# Patient Record
Sex: Female | Born: 1970 | Race: Black or African American | Hispanic: No | Marital: Married | Smoking: Never smoker
Health system: Southern US, Community
[De-identification: ages and names within clinical notes are randomized; demographics above are authoritative.]

## PROBLEM LIST (undated history)

## (undated) DIAGNOSIS — R7303 Prediabetes: Secondary | ICD-10-CM

## (undated) DIAGNOSIS — I071 Rheumatic tricuspid insufficiency: Secondary | ICD-10-CM

## (undated) DIAGNOSIS — M329 Systemic lupus erythematosus, unspecified: Secondary | ICD-10-CM

## (undated) DIAGNOSIS — I10 Essential (primary) hypertension: Secondary | ICD-10-CM

## (undated) DIAGNOSIS — M8700 Idiopathic aseptic necrosis of unspecified bone: Secondary | ICD-10-CM

## (undated) DIAGNOSIS — I272 Pulmonary hypertension, unspecified: Secondary | ICD-10-CM

## (undated) DIAGNOSIS — M3214 Glomerular disease in systemic lupus erythematosus: Secondary | ICD-10-CM

## (undated) DIAGNOSIS — M311 Thrombotic microangiopathy: Secondary | ICD-10-CM

## (undated) DIAGNOSIS — IMO0002 Reserved for concepts with insufficient information to code with codable children: Secondary | ICD-10-CM

## (undated) HISTORY — DX: Thrombotic microangiopathy: M31.1

## (undated) HISTORY — DX: Prediabetes: R73.03

## (undated) HISTORY — DX: Idiopathic aseptic necrosis of unspecified bone: M87.00

## (undated) HISTORY — DX: Pulmonary hypertension, unspecified: I27.20

## (undated) HISTORY — PX: OTHER SURGICAL HISTORY: SHX169

## (undated) HISTORY — PX: UMBILICAL HERNIA REPAIR: SHX196

## (undated) HISTORY — PX: TOTAL HIP ARTHROPLASTY: SHX124

## (undated) HISTORY — DX: Rheumatic tricuspid insufficiency: I07.1

## (undated) HISTORY — PX: HIP SURGERY: SHX245

## (undated) HISTORY — DX: Glomerular disease in systemic lupus erythematosus: M32.14

---

## 1998-08-04 ENCOUNTER — Ambulatory Visit (HOSPITAL_COMMUNITY): Admission: RE | Admit: 1998-08-04 | Discharge: 1998-08-04 | Payer: Self-pay | Admitting: Emergency Medicine

## 2000-05-31 ENCOUNTER — Other Ambulatory Visit: Admission: RE | Admit: 2000-05-31 | Discharge: 2000-05-31 | Payer: Self-pay | Admitting: *Deleted

## 2001-04-01 ENCOUNTER — Ambulatory Visit (HOSPITAL_COMMUNITY): Admission: RE | Admit: 2001-04-01 | Discharge: 2001-04-01 | Payer: Self-pay | Admitting: Nephrology

## 2001-04-02 ENCOUNTER — Encounter: Admission: RE | Admit: 2001-04-02 | Discharge: 2001-04-02 | Payer: Self-pay | Admitting: Nephrology

## 2001-04-02 ENCOUNTER — Encounter: Payer: Self-pay | Admitting: Nephrology

## 2001-04-03 ENCOUNTER — Encounter (INDEPENDENT_AMBULATORY_CARE_PROVIDER_SITE_OTHER): Payer: Self-pay | Admitting: *Deleted

## 2001-04-03 ENCOUNTER — Observation Stay (HOSPITAL_COMMUNITY): Admission: AD | Admit: 2001-04-03 | Discharge: 2001-04-04 | Payer: Self-pay | Admitting: Nephrology

## 2001-04-03 ENCOUNTER — Encounter: Payer: Self-pay | Admitting: Nephrology

## 2001-07-01 ENCOUNTER — Other Ambulatory Visit: Admission: RE | Admit: 2001-07-01 | Discharge: 2001-07-01 | Payer: Self-pay | Admitting: Obstetrics and Gynecology

## 2002-02-10 ENCOUNTER — Encounter: Payer: Self-pay | Admitting: Rheumatology

## 2002-02-10 ENCOUNTER — Encounter: Admission: RE | Admit: 2002-02-10 | Discharge: 2002-02-10 | Payer: Self-pay | Admitting: Rheumatology

## 2002-02-13 ENCOUNTER — Encounter: Payer: Self-pay | Admitting: Rheumatology

## 2002-02-13 ENCOUNTER — Encounter: Admission: RE | Admit: 2002-02-13 | Discharge: 2002-02-13 | Payer: Self-pay | Admitting: Rheumatology

## 2002-09-14 ENCOUNTER — Other Ambulatory Visit: Admission: RE | Admit: 2002-09-14 | Discharge: 2002-09-14 | Payer: Self-pay | Admitting: Obstetrics and Gynecology

## 2003-09-01 ENCOUNTER — Encounter: Admission: RE | Admit: 2003-09-01 | Discharge: 2003-09-01 | Payer: Self-pay | Admitting: Emergency Medicine

## 2003-10-21 ENCOUNTER — Other Ambulatory Visit: Admission: RE | Admit: 2003-10-21 | Discharge: 2003-10-21 | Payer: Self-pay | Admitting: Obstetrics and Gynecology

## 2005-07-16 DIAGNOSIS — M3119 Other thrombotic microangiopathy: Secondary | ICD-10-CM

## 2005-07-16 HISTORY — DX: Other thrombotic microangiopathy: M31.19

## 2005-12-12 ENCOUNTER — Emergency Department (HOSPITAL_COMMUNITY): Admission: EM | Admit: 2005-12-12 | Discharge: 2005-12-12 | Payer: Self-pay | Admitting: Emergency Medicine

## 2005-12-13 ENCOUNTER — Inpatient Hospital Stay (HOSPITAL_COMMUNITY): Admission: EM | Admit: 2005-12-13 | Discharge: 2005-12-18 | Payer: Self-pay | Admitting: Emergency Medicine

## 2005-12-13 ENCOUNTER — Ambulatory Visit: Payer: Self-pay | Admitting: Hematology and Oncology

## 2005-12-19 ENCOUNTER — Ambulatory Visit: Payer: Self-pay | Admitting: Hematology and Oncology

## 2005-12-19 ENCOUNTER — Encounter (HOSPITAL_COMMUNITY): Admission: RE | Admit: 2005-12-19 | Discharge: 2006-03-19 | Payer: Self-pay | Admitting: Hematology and Oncology

## 2005-12-20 LAB — COMPREHENSIVE METABOLIC PANEL
ALT: 20 U/L (ref 0–40)
AST: 17 U/L (ref 0–37)
Albumin: 3.7 g/dL (ref 3.5–5.2)
Alkaline Phosphatase: 53 U/L (ref 39–117)
Calcium: 9.4 mg/dL (ref 8.4–10.5)
Chloride: 101 mEq/L (ref 96–112)
Potassium: 3.6 mEq/L (ref 3.5–5.3)

## 2005-12-20 LAB — CBC WITH DIFFERENTIAL/PLATELET
BASO%: 0 % (ref 0.0–2.0)
EOS%: 0.1 % (ref 0.0–7.0)
MCH: 30.8 pg (ref 26.0–34.0)
MCHC: 33.5 g/dL (ref 32.0–36.0)
MONO%: 5.4 % (ref 0.0–13.0)
RBC: 2.55 10*6/uL — ABNORMAL LOW (ref 3.70–5.32)
RDW: 18.5 % — ABNORMAL HIGH (ref 11.3–14.5)
lymph#: 0.9 10*3/uL (ref 0.9–3.3)

## 2005-12-28 LAB — CBC WITH DIFFERENTIAL/PLATELET
Basophils Absolute: 0 10*3/uL (ref 0.0–0.1)
EOS%: 0 % (ref 0.0–7.0)
HGB: 9.4 g/dL — ABNORMAL LOW (ref 11.6–15.9)
LYMPH%: 20.9 % (ref 14.0–48.0)
MCH: 29.5 pg (ref 26.0–34.0)
MCV: 89.3 fL (ref 81.0–101.0)
MONO%: 3.9 % (ref 0.0–13.0)
Platelets: 130 10*3/uL — ABNORMAL LOW (ref 145–400)
RBC: 3.18 10*6/uL — ABNORMAL LOW (ref 3.70–5.32)
RDW: 20.3 % — ABNORMAL HIGH (ref 11.3–14.5)

## 2005-12-28 LAB — IRON AND TIBC
%SAT: 58 % — ABNORMAL HIGH (ref 20–55)
Iron: 190 ug/dL — ABNORMAL HIGH (ref 42–145)

## 2006-01-23 ENCOUNTER — Ambulatory Visit (HOSPITAL_COMMUNITY): Admission: RE | Admit: 2006-01-23 | Discharge: 2006-01-23 | Payer: Self-pay | Admitting: Hematology and Oncology

## 2006-01-23 LAB — COMPREHENSIVE METABOLIC PANEL
Albumin: 4.3 g/dL (ref 3.5–5.2)
BUN: 15 mg/dL (ref 6–23)
Calcium: 9.1 mg/dL (ref 8.4–10.5)
Chloride: 107 mEq/L (ref 96–112)
Glucose, Bld: 79 mg/dL (ref 70–99)
Potassium: 4 mEq/L (ref 3.5–5.3)

## 2006-01-23 LAB — CBC WITH DIFFERENTIAL/PLATELET
Basophils Absolute: 0 10*3/uL (ref 0.0–0.1)
HCT: 28.6 % — ABNORMAL LOW (ref 34.8–46.6)
HGB: 9.8 g/dL — ABNORMAL LOW (ref 11.6–15.9)
LYMPH%: 10 % — ABNORMAL LOW (ref 14.0–48.0)
MCH: 31.1 pg (ref 26.0–34.0)
MONO#: 0.4 10*3/uL (ref 0.1–0.9)
NEUT%: 85.3 % — ABNORMAL HIGH (ref 39.6–76.8)
Platelets: 224 10*3/uL (ref 145–400)
WBC: 8.8 10*3/uL (ref 3.9–10.0)
lymph#: 0.9 10*3/uL (ref 0.9–3.3)

## 2006-01-30 ENCOUNTER — Ambulatory Visit: Payer: Self-pay | Admitting: Hematology and Oncology

## 2006-01-30 LAB — COMPREHENSIVE METABOLIC PANEL
AST: 16 U/L (ref 0–37)
Alkaline Phosphatase: 32 U/L — ABNORMAL LOW (ref 39–117)
Glucose, Bld: 75 mg/dL (ref 70–99)
Potassium: 3.9 mEq/L (ref 3.5–5.3)
Sodium: 140 mEq/L (ref 135–145)
Total Bilirubin: 0.4 mg/dL (ref 0.3–1.2)
Total Protein: 7 g/dL (ref 6.0–8.3)

## 2006-01-30 LAB — CBC WITH DIFFERENTIAL/PLATELET
Basophils Absolute: 0 10*3/uL (ref 0.0–0.1)
EOS%: 0.1 % (ref 0.0–7.0)
Eosinophils Absolute: 0 10*3/uL (ref 0.0–0.5)
HGB: 10.4 g/dL — ABNORMAL LOW (ref 11.6–15.9)
MCH: 31.3 pg (ref 26.0–34.0)
MONO#: 0.5 10*3/uL (ref 0.1–0.9)
NEUT#: 6.6 10*3/uL — ABNORMAL HIGH (ref 1.5–6.5)
RDW: 15.9 % — ABNORMAL HIGH (ref 11.3–14.5)
WBC: 7.9 10*3/uL (ref 3.9–10.0)
lymph#: 0.8 10*3/uL — ABNORMAL LOW (ref 0.9–3.3)

## 2006-02-04 LAB — CBC WITH DIFFERENTIAL/PLATELET
Eosinophils Absolute: 0 10*3/uL (ref 0.0–0.5)
HCT: 30.6 % — ABNORMAL LOW (ref 34.8–46.6)
LYMPH%: 16.1 % (ref 14.0–48.0)
MCHC: 34.1 g/dL (ref 32.0–36.0)
MCV: 92.1 fL (ref 81.0–101.0)
MONO#: 0.5 10*3/uL (ref 0.1–0.9)
MONO%: 7.5 % (ref 0.0–13.0)
NEUT#: 5.6 10*3/uL (ref 1.5–6.5)
NEUT%: 76.3 % (ref 39.6–76.8)
Platelets: 275 10*3/uL (ref 145–400)
WBC: 7.3 10*3/uL (ref 3.9–10.0)

## 2006-02-04 LAB — COMPREHENSIVE METABOLIC PANEL
CO2: 25 mEq/L (ref 19–32)
Creatinine, Ser: 0.93 mg/dL (ref 0.40–1.20)
Glucose, Bld: 75 mg/dL (ref 70–99)
Total Bilirubin: 0.4 mg/dL (ref 0.3–1.2)

## 2006-02-04 LAB — LACTATE DEHYDROGENASE: LDH: 287 U/L — ABNORMAL HIGH (ref 94–250)

## 2006-02-15 ENCOUNTER — Other Ambulatory Visit: Admission: RE | Admit: 2006-02-15 | Discharge: 2006-02-15 | Payer: Self-pay | Admitting: Obstetrics and Gynecology

## 2006-02-22 LAB — CBC WITH DIFFERENTIAL/PLATELET
BASO%: 0.2 % (ref 0.0–2.0)
Eosinophils Absolute: 0 10*3/uL (ref 0.0–0.5)
LYMPH%: 17.5 % (ref 14.0–48.0)
MCHC: 34.3 g/dL (ref 32.0–36.0)
MONO#: 0.4 10*3/uL (ref 0.1–0.9)
NEUT#: 4.5 10*3/uL (ref 1.5–6.5)
RBC: 3.24 10*6/uL — ABNORMAL LOW (ref 3.70–5.32)
RDW: 11.7 % (ref 11.3–14.5)
WBC: 6 10*3/uL (ref 3.9–10.0)
lymph#: 1 10*3/uL (ref 0.9–3.3)

## 2006-02-22 LAB — LACTATE DEHYDROGENASE: LDH: 217 U/L (ref 94–250)

## 2006-02-22 LAB — COMPREHENSIVE METABOLIC PANEL
ALT: 16 U/L (ref 0–40)
Albumin: 4.1 g/dL (ref 3.5–5.2)
CO2: 24 mEq/L (ref 19–32)
Chloride: 106 mEq/L (ref 96–112)
Potassium: 4.2 mEq/L (ref 3.5–5.3)
Sodium: 139 mEq/L (ref 135–145)
Total Bilirubin: 0.3 mg/dL (ref 0.3–1.2)
Total Protein: 6.4 g/dL (ref 6.0–8.3)

## 2006-03-04 LAB — CBC WITH DIFFERENTIAL/PLATELET
Basophils Absolute: 0 10*3/uL (ref 0.0–0.1)
HCT: 30.2 % — ABNORMAL LOW (ref 34.8–46.6)
HGB: 10.3 g/dL — ABNORMAL LOW (ref 11.6–15.9)
MCH: 31.4 pg (ref 26.0–34.0)
MONO#: 0.5 10*3/uL (ref 0.1–0.9)
NEUT%: 55.1 % (ref 39.6–76.8)
lymph#: 1.5 10*3/uL (ref 0.9–3.3)

## 2006-03-04 LAB — COMPREHENSIVE METABOLIC PANEL
BUN: 12 mg/dL (ref 6–23)
CO2: 24 mEq/L (ref 19–32)
Calcium: 8.8 mg/dL (ref 8.4–10.5)
Chloride: 110 mEq/L (ref 96–112)
Creatinine, Ser: 0.95 mg/dL (ref 0.40–1.20)
Glucose, Bld: 75 mg/dL (ref 70–99)

## 2006-03-04 LAB — LACTATE DEHYDROGENASE: LDH: 192 U/L (ref 94–250)

## 2006-03-16 ENCOUNTER — Emergency Department (HOSPITAL_COMMUNITY): Admission: EM | Admit: 2006-03-16 | Discharge: 2006-03-16 | Payer: Self-pay | Admitting: Emergency Medicine

## 2006-04-03 ENCOUNTER — Ambulatory Visit: Payer: Self-pay | Admitting: Hematology and Oncology

## 2006-04-05 LAB — CBC WITH DIFFERENTIAL/PLATELET
Basophils Absolute: 0 10*3/uL (ref 0.0–0.1)
EOS%: 0.2 % (ref 0.0–7.0)
Eosinophils Absolute: 0 10*3/uL (ref 0.0–0.5)
HGB: 10.9 g/dL — ABNORMAL LOW (ref 11.6–15.9)
NEUT#: 2.7 10*3/uL (ref 1.5–6.5)
RDW: 10.8 % — ABNORMAL LOW (ref 11.3–14.5)
lymph#: 1.1 10*3/uL (ref 0.9–3.3)

## 2006-04-05 LAB — COMPREHENSIVE METABOLIC PANEL
ALT: 14 U/L (ref 0–40)
Alkaline Phosphatase: 39 U/L (ref 39–117)
CO2: 26 mEq/L (ref 19–32)
Creatinine, Ser: 0.91 mg/dL (ref 0.40–1.20)
Total Bilirubin: 0.3 mg/dL (ref 0.3–1.2)

## 2006-04-05 LAB — LACTATE DEHYDROGENASE: LDH: 149 U/L (ref 94–250)

## 2006-04-25 LAB — COMPREHENSIVE METABOLIC PANEL
Albumin: 4.3 g/dL (ref 3.5–5.2)
BUN: 13 mg/dL (ref 6–23)
CO2: 23 mEq/L (ref 19–32)
Calcium: 8.9 mg/dL (ref 8.4–10.5)
Glucose, Bld: 77 mg/dL (ref 70–99)
Potassium: 3.7 mEq/L (ref 3.5–5.3)
Sodium: 141 mEq/L (ref 135–145)
Total Protein: 7.6 g/dL (ref 6.0–8.3)

## 2006-04-25 LAB — CBC WITH DIFFERENTIAL/PLATELET
Basophils Absolute: 0 10*3/uL (ref 0.0–0.1)
Eosinophils Absolute: 0 10*3/uL (ref 0.0–0.5)
HGB: 10.4 g/dL — ABNORMAL LOW (ref 11.6–15.9)
MCV: 87.4 fL (ref 81.0–101.0)
MONO#: 0.3 10*3/uL (ref 0.1–0.9)
NEUT#: 2.5 10*3/uL (ref 1.5–6.5)
Platelets: 177 10*3/uL (ref 145–400)
RBC: 3.49 10*6/uL — ABNORMAL LOW (ref 3.70–5.32)
RDW: 10.9 % — ABNORMAL LOW (ref 11.3–14.5)
WBC: 3.9 10*3/uL (ref 3.9–10.0)

## 2006-04-25 LAB — LACTATE DEHYDROGENASE: LDH: 155 U/L (ref 94–250)

## 2006-05-29 ENCOUNTER — Ambulatory Visit: Payer: Self-pay | Admitting: Hematology and Oncology

## 2006-06-04 LAB — MORPHOLOGY

## 2006-06-04 LAB — COMPREHENSIVE METABOLIC PANEL
ALT: 11 U/L (ref 0–35)
AST: 15 U/L (ref 0–37)
BUN: 12 mg/dL (ref 6–23)
CO2: 26 mEq/L (ref 19–32)
Calcium: 9.2 mg/dL (ref 8.4–10.5)
Creatinine, Ser: 0.86 mg/dL (ref 0.40–1.20)
Glucose, Bld: 107 mg/dL — ABNORMAL HIGH (ref 70–99)
Sodium: 139 mEq/L (ref 135–145)
Total Bilirubin: 0.5 mg/dL (ref 0.3–1.2)

## 2006-06-04 LAB — CBC WITH DIFFERENTIAL/PLATELET
BASO%: 0.5 % (ref 0.0–2.0)
LYMPH%: 31.5 % (ref 14.0–48.0)
MCV: 86.2 fL (ref 81.0–101.0)
MONO%: 8.2 % (ref 0.0–13.0)
NEUT#: 2.5 10*3/uL (ref 1.5–6.5)
RBC: 3.41 10*6/uL — ABNORMAL LOW (ref 3.70–5.32)
RDW: 12.4 % (ref 11.3–14.5)
lymph#: 1.3 10*3/uL (ref 0.9–3.3)

## 2006-07-02 LAB — CBC WITH DIFFERENTIAL/PLATELET
Eosinophils Absolute: 0 10*3/uL (ref 0.0–0.5)
HCT: 29.5 % — ABNORMAL LOW (ref 34.8–46.6)
MCH: 29 pg (ref 26.0–34.0)
MCHC: 33.2 g/dL (ref 32.0–36.0)
MCV: 87.2 fL (ref 81.0–101.0)
MONO#: 0.4 10*3/uL (ref 0.1–0.9)
NEUT#: 3.5 10*3/uL (ref 1.5–6.5)
NEUT%: 66.1 % (ref 39.6–76.8)
RDW: 13.5 % (ref 11.3–14.5)
WBC: 5.3 10*3/uL (ref 3.9–10.0)
lymph#: 1.4 10*3/uL (ref 0.9–3.3)

## 2006-07-02 LAB — COMPREHENSIVE METABOLIC PANEL
ALT: 17 U/L (ref 0–35)
AST: 22 U/L (ref 0–37)
Albumin: 4.6 g/dL (ref 3.5–5.2)
BUN: 13 mg/dL (ref 6–23)
Creatinine, Ser: 0.93 mg/dL (ref 0.40–1.20)
Total Protein: 7.8 g/dL (ref 6.0–8.3)

## 2006-07-02 LAB — LACTATE DEHYDROGENASE: LDH: 200 U/L (ref 94–250)

## 2006-07-13 ENCOUNTER — Ambulatory Visit: Payer: Self-pay | Admitting: Hematology and Oncology

## 2006-07-18 LAB — COMPREHENSIVE METABOLIC PANEL
AST: 15 U/L (ref 0–37)
Albumin: 4.4 g/dL (ref 3.5–5.2)
BUN: 21 mg/dL (ref 6–23)
CO2: 28 mEq/L (ref 19–32)
Calcium: 9.1 mg/dL (ref 8.4–10.5)
Chloride: 104 mEq/L (ref 96–112)
Creatinine, Ser: 0.95 mg/dL (ref 0.40–1.20)
Glucose, Bld: 73 mg/dL (ref 70–99)
Potassium: 3.8 mEq/L (ref 3.5–5.3)

## 2006-07-18 LAB — CBC WITH DIFFERENTIAL/PLATELET
Basophils Absolute: 0 10*3/uL (ref 0.0–0.1)
EOS%: 0.4 % (ref 0.0–7.0)
Eosinophils Absolute: 0 10*3/uL (ref 0.0–0.5)
HCT: 33.5 % — ABNORMAL LOW (ref 34.8–46.6)
HGB: 10.8 g/dL — ABNORMAL LOW (ref 11.6–15.9)
MONO#: 0.9 10*3/uL (ref 0.1–0.9)
NEUT#: 7 10*3/uL — ABNORMAL HIGH (ref 1.5–6.5)
NEUT%: 61.9 % (ref 39.6–76.8)
RDW: 13.8 % (ref 11.3–14.5)
WBC: 11.3 10*3/uL — ABNORMAL HIGH (ref 3.9–10.0)
lymph#: 3.3 10*3/uL (ref 0.9–3.3)

## 2006-07-18 LAB — LACTATE DEHYDROGENASE: LDH: 144 U/L (ref 94–250)

## 2006-08-29 ENCOUNTER — Ambulatory Visit: Payer: Self-pay | Admitting: Hematology and Oncology

## 2006-09-03 LAB — CBC WITH DIFFERENTIAL/PLATELET
EOS%: 0.2 % (ref 0.0–7.0)
Eosinophils Absolute: 0 10*3/uL (ref 0.0–0.5)
MCV: 89 fL (ref 81.0–101.0)
MONO%: 10.2 % (ref 0.0–13.0)
NEUT#: 3 10*3/uL (ref 1.5–6.5)
RBC: 3.92 10*6/uL (ref 3.70–5.32)
RDW: 14.1 % (ref 11.3–14.5)
lymph#: 2.6 10*3/uL (ref 0.9–3.3)

## 2006-09-03 LAB — COMPREHENSIVE METABOLIC PANEL
AST: 16 U/L (ref 0–37)
Albumin: 4.2 g/dL (ref 3.5–5.2)
Alkaline Phosphatase: 28 U/L — ABNORMAL LOW (ref 39–117)
Chloride: 106 mEq/L (ref 96–112)
Glucose, Bld: 77 mg/dL (ref 70–99)
Potassium: 4 mEq/L (ref 3.5–5.3)
Sodium: 143 mEq/L (ref 135–145)
Total Protein: 7.1 g/dL (ref 6.0–8.3)

## 2006-10-29 ENCOUNTER — Ambulatory Visit: Payer: Self-pay | Admitting: Hematology and Oncology

## 2006-11-01 LAB — CBC WITH DIFFERENTIAL/PLATELET
Basophils Absolute: 0 10*3/uL (ref 0.0–0.1)
Eosinophils Absolute: 0 10*3/uL (ref 0.0–0.5)
HGB: 10.9 g/dL — ABNORMAL LOW (ref 11.6–15.9)
LYMPH%: 30 % (ref 14.0–48.0)
MCV: 88 fL (ref 81.0–101.0)
MONO%: 12.4 % (ref 0.0–13.0)
NEUT#: 2.6 10*3/uL (ref 1.5–6.5)
NEUT%: 56.8 % (ref 39.6–76.8)
Platelets: 192 10*3/uL (ref 145–400)

## 2006-12-17 ENCOUNTER — Ambulatory Visit: Payer: Self-pay | Admitting: Oncology

## 2006-12-19 LAB — CBC WITH DIFFERENTIAL/PLATELET
BASO%: 0.2 % (ref 0.0–2.0)
Eosinophils Absolute: 0 10*3/uL (ref 0.0–0.5)
LYMPH%: 17.5 % (ref 14.0–48.0)
MCHC: 34.9 g/dL (ref 32.0–36.0)
MCV: 87.4 fL (ref 81.0–101.0)
MONO%: 7.5 % (ref 0.0–13.0)
Platelets: 163 10*3/uL (ref 145–400)
RBC: 3.21 10*6/uL — ABNORMAL LOW (ref 3.70–5.32)

## 2006-12-19 LAB — COMPREHENSIVE METABOLIC PANEL
Alkaline Phosphatase: 29 U/L — ABNORMAL LOW (ref 39–117)
Glucose, Bld: 77 mg/dL (ref 70–99)
Sodium: 138 mEq/L (ref 135–145)
Total Bilirubin: 0.3 mg/dL (ref 0.3–1.2)
Total Protein: 7.1 g/dL (ref 6.0–8.3)

## 2007-01-14 ENCOUNTER — Ambulatory Visit (HOSPITAL_COMMUNITY): Admission: RE | Admit: 2007-01-14 | Discharge: 2007-01-14 | Payer: Self-pay | Admitting: Obstetrics and Gynecology

## 2007-01-22 LAB — CBC WITH DIFFERENTIAL/PLATELET
BASO%: 0.3 % (ref 0.0–2.0)
Basophils Absolute: 0 10*3/uL (ref 0.0–0.1)
Eosinophils Absolute: 0 10*3/uL (ref 0.0–0.5)
HCT: 28 % — ABNORMAL LOW (ref 34.8–46.6)
HGB: 9.7 g/dL — ABNORMAL LOW (ref 11.6–15.9)
LYMPH%: 16.2 % (ref 14.0–48.0)
MCHC: 34.5 g/dL (ref 32.0–36.0)
MONO#: 0.4 10*3/uL (ref 0.1–0.9)
NEUT%: 77.1 % — ABNORMAL HIGH (ref 39.6–76.8)
Platelets: 161 10*3/uL (ref 145–400)
WBC: 5.9 10*3/uL (ref 3.9–10.0)

## 2007-01-22 LAB — COMPREHENSIVE METABOLIC PANEL
ALT: 20 U/L (ref 0–35)
AST: 20 U/L (ref 0–37)
Alkaline Phosphatase: 32 U/L — ABNORMAL LOW (ref 39–117)
CO2: 22 mEq/L (ref 19–32)
Creatinine, Ser: 0.66 mg/dL (ref 0.40–1.20)
Sodium: 138 mEq/L (ref 135–145)
Total Bilirubin: 0.3 mg/dL (ref 0.3–1.2)
Total Protein: 6.8 g/dL (ref 6.0–8.3)

## 2007-01-22 LAB — LACTATE DEHYDROGENASE: LDH: 134 U/L (ref 94–250)

## 2007-03-12 ENCOUNTER — Ambulatory Visit: Payer: Self-pay | Admitting: Hematology and Oncology

## 2007-03-12 LAB — CBC WITH DIFFERENTIAL/PLATELET
BASO%: 0.3 % (ref 0.0–2.0)
Basophils Absolute: 0 10*3/uL (ref 0.0–0.1)
EOS%: 0.1 % (ref 0.0–7.0)
HCT: 25.7 % — ABNORMAL LOW (ref 34.8–46.6)
HGB: 8.9 g/dL — ABNORMAL LOW (ref 11.6–15.9)
LYMPH%: 14.3 % (ref 14.0–48.0)
MCH: 30.9 pg (ref 26.0–34.0)
MCHC: 34.5 g/dL (ref 32.0–36.0)
MCV: 89.4 fL (ref 81.0–101.0)
NEUT%: 79.6 % — ABNORMAL HIGH (ref 39.6–76.8)
Platelets: 170 10*3/uL (ref 145–400)
lymph#: 0.8 10*3/uL — ABNORMAL LOW (ref 0.9–3.3)

## 2007-03-12 LAB — BASIC METABOLIC PANEL
BUN: 7 mg/dL (ref 6–23)
Calcium: 10.1 mg/dL (ref 8.4–10.5)
Chloride: 109 mEq/L (ref 96–112)
Creatinine, Ser: 0.67 mg/dL (ref 0.40–1.20)

## 2007-03-25 LAB — CBC WITH DIFFERENTIAL/PLATELET
EOS%: 0.2 % (ref 0.0–7.0)
MCH: 30.5 pg (ref 26.0–34.0)
MCV: 89.5 fL (ref 81.0–101.0)
MONO%: 5.4 % (ref 0.0–13.0)
NEUT#: 5.4 10*3/uL (ref 1.5–6.5)
RBC: 2.94 10*6/uL — ABNORMAL LOW (ref 3.70–5.32)
RDW: 13.6 % (ref 11.3–14.5)
lymph#: 0.9 10*3/uL (ref 0.9–3.3)

## 2007-03-25 LAB — COMPREHENSIVE METABOLIC PANEL
AST: 21 U/L (ref 0–37)
Albumin: 3.6 g/dL (ref 3.5–5.2)
Alkaline Phosphatase: 47 U/L (ref 39–117)
Potassium: 3.7 mEq/L (ref 3.5–5.3)
Sodium: 139 mEq/L (ref 135–145)
Total Bilirubin: 0.3 mg/dL (ref 0.3–1.2)
Total Protein: 6.7 g/dL (ref 6.0–8.3)

## 2007-05-20 ENCOUNTER — Ambulatory Visit: Payer: Self-pay | Admitting: Hematology and Oncology

## 2007-05-22 LAB — CBC WITH DIFFERENTIAL/PLATELET
Eosinophils Absolute: 0 10*3/uL (ref 0.0–0.5)
HCT: 26.7 % — ABNORMAL LOW (ref 34.8–46.6)
LYMPH%: 12.7 % — ABNORMAL LOW (ref 14.0–48.0)
MCV: 88.9 fL (ref 81.0–101.0)
MONO#: 0.2 10*3/uL (ref 0.1–0.9)
MONO%: 3.2 % (ref 0.0–13.0)
NEUT#: 6.3 10*3/uL (ref 1.5–6.5)
NEUT%: 83.8 % — ABNORMAL HIGH (ref 39.6–76.8)
Platelets: 158 10*3/uL (ref 145–400)
RBC: 3.01 10*6/uL — ABNORMAL LOW (ref 3.70–5.32)

## 2007-05-22 LAB — COMPREHENSIVE METABOLIC PANEL
Alkaline Phosphatase: 86 U/L (ref 39–117)
BUN: 10 mg/dL (ref 6–23)
CO2: 20 mEq/L (ref 19–32)
Creatinine, Ser: 0.74 mg/dL (ref 0.40–1.20)
Glucose, Bld: 71 mg/dL (ref 70–99)
Sodium: 139 mEq/L (ref 135–145)
Total Bilirubin: 0.3 mg/dL (ref 0.3–1.2)
Total Protein: 6.6 g/dL (ref 6.0–8.3)

## 2007-05-22 LAB — LACTATE DEHYDROGENASE: LDH: 157 U/L (ref 94–250)

## 2007-06-20 LAB — CBC WITH DIFFERENTIAL/PLATELET
BASO%: 0 % (ref 0.0–2.0)
Basophils Absolute: 0 10*3/uL (ref 0.0–0.1)
Eosinophils Absolute: 0 10*3/uL (ref 0.0–0.5)
HCT: 27.2 % — ABNORMAL LOW (ref 34.8–46.6)
HGB: 9.6 g/dL — ABNORMAL LOW (ref 11.6–15.9)
LYMPH%: 8.2 % — ABNORMAL LOW (ref 14.0–48.0)
MCHC: 35.2 g/dL (ref 32.0–36.0)
MONO#: 0.3 10*3/uL (ref 0.1–0.9)
NEUT#: 8.7 10*3/uL — ABNORMAL HIGH (ref 1.5–6.5)
NEUT%: 88.9 % — ABNORMAL HIGH (ref 39.6–76.8)
Platelets: 184 10*3/uL (ref 145–400)
WBC: 9.8 10*3/uL (ref 3.9–10.0)
lymph#: 0.8 10*3/uL — ABNORMAL LOW (ref 0.9–3.3)

## 2007-06-26 ENCOUNTER — Inpatient Hospital Stay (HOSPITAL_COMMUNITY): Admission: AD | Admit: 2007-06-26 | Discharge: 2007-06-29 | Payer: Self-pay | Admitting: Obstetrics and Gynecology

## 2007-06-27 ENCOUNTER — Encounter (INDEPENDENT_AMBULATORY_CARE_PROVIDER_SITE_OTHER): Payer: Self-pay | Admitting: Obstetrics and Gynecology

## 2007-07-01 ENCOUNTER — Encounter (HOSPITAL_COMMUNITY): Admission: RE | Admit: 2007-07-01 | Discharge: 2007-07-16 | Payer: Self-pay | Admitting: Hematology and Oncology

## 2007-07-01 LAB — LACTATE DEHYDROGENASE: LDH: 176 U/L (ref 94–250)

## 2007-07-01 LAB — COMPREHENSIVE METABOLIC PANEL
ALT: 30 U/L (ref 0–35)
CO2: 25 mEq/L (ref 19–32)
Calcium: 8.4 mg/dL (ref 8.4–10.5)
Chloride: 108 mEq/L (ref 96–112)
Creatinine, Ser: 0.92 mg/dL (ref 0.40–1.20)
Glucose, Bld: 93 mg/dL (ref 70–99)
Sodium: 142 mEq/L (ref 135–145)
Total Protein: 5.9 g/dL — ABNORMAL LOW (ref 6.0–8.3)

## 2007-07-01 LAB — CBC WITH DIFFERENTIAL/PLATELET
BASO%: 0.4 % (ref 0.0–2.0)
Eosinophils Absolute: 0 10*3/uL (ref 0.0–0.5)
HCT: 19.2 % — ABNORMAL LOW (ref 34.8–46.6)
LYMPH%: 22.3 % (ref 14.0–48.0)
MCHC: 34.2 g/dL (ref 32.0–36.0)
MONO#: 0.4 10*3/uL (ref 0.1–0.9)
NEUT#: 5.7 10*3/uL (ref 1.5–6.5)
NEUT%: 72.4 % (ref 39.6–76.8)
Platelets: 211 10*3/uL (ref 145–400)
WBC: 7.9 10*3/uL (ref 3.9–10.0)
lymph#: 1.8 10*3/uL (ref 0.9–3.3)

## 2007-07-01 LAB — HOLD TUBE, BLOOD BANK

## 2007-09-24 ENCOUNTER — Ambulatory Visit: Payer: Self-pay | Admitting: Hematology and Oncology

## 2007-09-26 LAB — CBC WITH DIFFERENTIAL/PLATELET
BASO%: 0.1 % (ref 0.0–2.0)
Basophils Absolute: 0 10*3/uL (ref 0.0–0.1)
HCT: 34.6 % — ABNORMAL LOW (ref 34.8–46.6)
MCH: 29.5 pg (ref 26.0–34.0)
MCHC: 33.9 g/dL (ref 32.0–36.0)
MONO%: 3.1 % (ref 0.0–13.0)
NEUT%: 81.4 % — ABNORMAL HIGH (ref 39.6–76.8)
Platelets: 223 10*3/uL (ref 145–400)
RBC: 3.98 10*6/uL (ref 3.70–5.32)
RDW: 13 % (ref 11.3–14.5)
lymph#: 1.2 10*3/uL (ref 0.9–3.3)

## 2007-09-26 LAB — COMPREHENSIVE METABOLIC PANEL
AST: 24 U/L (ref 0–37)
CO2: 25 mEq/L (ref 19–32)
Chloride: 105 mEq/L (ref 96–112)
Glucose, Bld: 70 mg/dL (ref 70–99)
Potassium: 3.9 mEq/L (ref 3.5–5.3)
Sodium: 143 mEq/L (ref 135–145)

## 2007-09-26 LAB — LACTATE DEHYDROGENASE: LDH: 189 U/L (ref 94–250)

## 2008-03-29 ENCOUNTER — Ambulatory Visit: Payer: Self-pay | Admitting: Hematology and Oncology

## 2008-03-31 LAB — CBC WITH DIFFERENTIAL/PLATELET
BASO%: 0.3 % (ref 0.0–2.0)
HCT: 31.4 % — ABNORMAL LOW (ref 34.8–46.6)
MCH: 29.4 pg (ref 26.0–34.0)
MCHC: 33.9 g/dL (ref 32.0–36.0)
MONO#: 0.3 10*3/uL (ref 0.1–0.9)
MONO%: 8.5 % (ref 0.0–13.0)
NEUT%: 56.3 % (ref 39.6–76.8)

## 2008-03-31 LAB — COMPREHENSIVE METABOLIC PANEL
ALT: 12 U/L (ref 0–35)
Albumin: 4.2 g/dL (ref 3.5–5.2)
BUN: 12 mg/dL (ref 6–23)
CO2: 24 mEq/L (ref 19–32)
Potassium: 3.9 mEq/L (ref 3.5–5.3)
Sodium: 140 mEq/L (ref 135–145)
Total Protein: 7.8 g/dL (ref 6.0–8.3)

## 2008-03-31 LAB — LACTATE DEHYDROGENASE: LDH: 148 U/L (ref 94–250)

## 2008-10-11 DIAGNOSIS — M329 Systemic lupus erythematosus, unspecified: Secondary | ICD-10-CM | POA: Insufficient documentation

## 2008-11-02 ENCOUNTER — Ambulatory Visit: Payer: Self-pay | Admitting: Hematology and Oncology

## 2008-11-17 LAB — COMPREHENSIVE METABOLIC PANEL
ALT: 12 U/L (ref 0–35)
AST: 18 U/L (ref 0–37)
Alkaline Phosphatase: 45 U/L (ref 39–117)
BUN: 12 mg/dL (ref 6–23)
CO2: 24 mEq/L (ref 19–32)
Creatinine, Ser: 0.76 mg/dL (ref 0.40–1.20)
Potassium: 4 mEq/L (ref 3.5–5.3)
Sodium: 142 mEq/L (ref 135–145)
Total Bilirubin: 0.4 mg/dL (ref 0.3–1.2)
Total Protein: 7.5 g/dL (ref 6.0–8.3)

## 2008-11-17 LAB — CBC WITH DIFFERENTIAL/PLATELET
Basophils Absolute: 0 10*3/uL (ref 0.0–0.1)
EOS%: 0.3 % (ref 0.0–7.0)
Eosinophils Absolute: 0 10*3/uL (ref 0.0–0.5)
HCT: 30.4 % — ABNORMAL LOW (ref 34.8–46.6)
HGB: 10.2 g/dL — ABNORMAL LOW (ref 11.6–15.9)
LYMPH%: 30.4 % (ref 14.0–49.7)
MCH: 29.3 pg (ref 25.1–34.0)
MONO%: 8 % (ref 0.0–14.0)
NEUT#: 2 10*3/uL (ref 1.5–6.5)
NEUT%: 60.8 % (ref 38.4–76.8)
Platelets: 123 10*3/uL — ABNORMAL LOW (ref 145–400)
lymph#: 1 10*3/uL (ref 0.9–3.3)

## 2008-11-17 LAB — LACTATE DEHYDROGENASE: LDH: 168 U/L (ref 94–250)

## 2009-01-28 ENCOUNTER — Encounter: Admission: RE | Admit: 2009-01-28 | Discharge: 2009-01-28 | Payer: Self-pay | Admitting: Family Medicine

## 2009-01-31 ENCOUNTER — Ambulatory Visit (HOSPITAL_COMMUNITY): Admission: RE | Admit: 2009-01-31 | Discharge: 2009-01-31 | Payer: Self-pay | Admitting: Obstetrics and Gynecology

## 2009-02-01 ENCOUNTER — Encounter: Admission: RE | Admit: 2009-02-01 | Discharge: 2009-02-01 | Payer: Self-pay | Admitting: Family Medicine

## 2009-08-17 ENCOUNTER — Ambulatory Visit: Payer: Self-pay | Admitting: Hematology and Oncology

## 2010-08-05 ENCOUNTER — Encounter: Payer: Self-pay | Admitting: Emergency Medicine

## 2010-11-28 NOTE — H&P (Signed)
NAME:  [name], [name]            ACCOUNT NO.:  [id number]   MEDICAL RECORD NO.:  [id number]          PATIENT TYPE:  INP   LOCATION:  9173                          FACILITY:  [facility]   PHYSICIAN:  [name], M.D.DATE OF BIRTH:  August 06, 1970   DATE OF ADMISSION:  06/26/2007  DATE OF DISCHARGE:                              HISTORY & PHYSICAL   HISTORY OF PRESENT ILLNESS:  [name] is a 40 year old, married,  black female, gravida 1, para 0 at 37-6/7th's weeks per [facility] of July 11, 2007 by a 7-week ultrasound who presented to maternity admission  unit this evening with complaint of light red pinkish vaginal discharge  and vaginal bleeding with onset at approximately 4 p.m. after the  patient got up from a nap.  She was seen at our office earlier in the  day for her scheduled NST which was reactive.  She has been followed by  the M.D. service at [facility] and has been having twice weekly NSTs secondary  to a history of lupus.  The discharge continued since the onset and as  well after admission to [facility]U.  The patient denied headache, visual  disturbances, epigastric pain, nausea, vomiting, diarrhea, chest pain,  difficulty breathing, or dysuria.  She did report occasional uterine  contractions prior to admission that were infrequent, however, since  arrival had become more frequent with increased discomfort since she  arrived.  The patient was sent over by [name].  [name] did  desire serial blood pressure readings and PIH labs as well as continuous  monitoring.   OBSTETRICAL HISTORY:  The patient is a primigravida.   DRUG ALLERGIES:  The patient has an allergy to PENICILLIN which causes  hives and itching.   PAST MEDICAL HISTORY:  1. The patient reports chicken pox at approximately age 52.  2. The patient does report a history of TTP in May 2007.  3. She has a history of anemia.  4. She has had multiple blood transfusions in the past in 2003 and      2007.  5. 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  6. She has a history of nephritis in 2002.  7. The patient was diagnosed with lupus in July 2002.  8. The patient reports an umbilical hernia at age 58.  82. The patient had a kidney biopsy in 2002.  10.Vacuolized lobular grafts in 2003 x2.  11.The patient has a history of thrombocytopenia.  12.The patient has a history of hip surgery x2 that was when she had      the transfusions.   SOCIAL HISTORY:  The patient is married.  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  Her husband,  [name], reports 16 years of education and he works in the  Production manager.  She denied any alcohol, tobacco, or illegal drug use.   REVIEW OF SYSTEMS:  Please see history of present illness.   FAMILY HISTORY:  She reports her mother had a valve irregularity.  Chronic hypertension in her father, mother, maternal grandmother, and a  paternal aunt.  Her mother  as well has anemia.  Her mother does have a  thyroid dysfunction and is on meds.  She has a maternal first cousin  with epilepsy.  Her maternal grandmother and paternal grandmother both  have Alzheimer's disease.  She also has a paternal aunt who has lupus.  She has a paternal grandfather with MS.  Maternal grandmother who had  breast cancer.  Paternal uncle with alcoholism.   GENETIC HISTORY:  Remarkable for a maternal aunt of the father of the  baby with twins.   PHYSICAL EXAMINATION:  HEENT:  Within normal limits.  HEART:  Regular rate and rhythm.  No murmur.  CHEST:  Clear to auscultation bilaterally.  ABDOMEN:  Soft, nontender and gravid.  Fundal height approximately 37-  cm.  EXTREMITIES:  She has mild nonpitting edema in bilateral lower  extremities with no clonus and deep tendon reflexes were 2 plus.  PELVIC:  On inspection of perineum, external genitalia with moist clear  fluid noted.  Her peripad had pinkish fluid 60-70% saturated.  Speculum  exam difficulty  viewing and unable to visualize cervix patient secondary  to her hip surgeries has limited range of motion and patient with  difficulty relaxing.  Unable to open blades of speculum, however, with  limited viewing negative pooling, negative Nitrazine, and negative fern.  However, on bimanual exam the cervix was 1-to-2-cm, 60% effaced, minus 2  station, and vertex.  When bimanual exam completed and gloved hand  removed a moderate amount of fluid noted on glove with a brownish kind  of mucus.  The patient did report that her cervix was closed and long on  Monday.  VITAL SIGNS:  The patient's blood pressure was within normal limits.  Her blood pressure ranged systolic [id number] to [id number] and diastolic 49 to 78.  Her temperature was 97.6.  Heart rate was 88 and respirations were 18.   Electronic fetal monitor:  Fetal heart rate was 130, reactive with  moderate variability.  She did have questionable decels appearing to be  late decelerations at approximately 1940 this evening, otherwise  reassuring.  Toco:  She is having some irregular uterine contractions  approximately 3-8 minutes apart which were mild to moderate on  palpation.   LABORATORY:  The patient's CBC was within normal limits and to note  white blood cell count was equal to 9.1, hemoglobin 10.5, her platelets  were 190.  Her CMP was within normal limits with the exception of a low  albumin which was 2.7.  The patient's LDH was 141 which was within  normal limits.  Uric acid equal to 6.6 within normal limits.  Just to  note from CMP, liver enzymes, AST was 24, ALT 21.  Per prenatal record,  the patient has had three to four 24-hour urine prenatal, the first of  which was around the beginning of her second trimester and total protein  was equal to 105.  She had another 24-hour urine repeated on  approximately October 8th, she was around 28 weeks and her total protein  was 293.  She did report another 24-hour urine, a third one, where the   patient stated total protein was above 1,000 and then she has also had  her last 24-hour urine on December 3rd, and total protein was 2,723.  The patient's GBS is negative.  Her labs at her new OB visit:  Hemoglobin was 10.4, platelets were 169.  She has A positive blood type.  Rubella immune.  RPR nonreactive.  Hepatitis  surface antigen negative.  GBS was negative at the end of her third trimester.   ASSESSMENT:  1. Intrauterine pregnancy at term.  2. Vaginal bleeding with leakage of fluid with questionable      spontaneous rupture of membranes at approximately 1600.  3. Stable Mom and fetus.  4. History of lupus.  The patient is currently taking prednisone 15 mg      daily, other medications include prenatal vitamins and iron.  5. History of nephritis.  6. Group Beta strep negative.   PLAN:  1. The patient will be admitted to L&D to room 173 for observation      overnight.  Per consultation with Dr. Raphael Gibney.  2. Per M.D. orders continue electronic fetal monitoring, vital signs      q.4 h., and regular diet.  3. UA is pending.  4. Close monitoring for continued leakage of fluid and/or signs and      symptoms of infection or PIH signs and symptoms. The patient did      note that she is scheduled for induction of labor Sunday night      because her cervix was closed on Monday in the office.  She was put      down for cervical ripening Sunday night.  However, if leakage of      fluid continues and the patient does have onset of labor, we will      proceed with delivery.      Candice Donne Hazel, [place]      [name], M.D.  Electronically Signed    CHS/MEDQ  D:  06/26/2007  T:  06/26/2007  Job:  [id number]

## 2010-11-28 NOTE — Discharge Summary (Signed)
NAMECAMY, HASLIP            ACCOUNT NO.:  [id number]   MEDICAL RECORD NO.:  [id number]          PATIENT TYPE:  INP   LOCATION:  9126                          FACILITY:  [facility]   PHYSICIAN:  Katharine Look A. Rivard, M.D. DATE OF BIRTH:  01/04/1971   DATE OF ADMISSION:  06/26/2007  DATE OF DISCHARGE:  06/29/2007                               DISCHARGE SUMMARY   ATTENDING PHYSICIAN:  Katharine Look A. Rivard, M.D.   ADMITTING DIAGNOSES:  1. Intrauterine pregnancy at 37-6/7 weeks.  2. Third trimester bleeding with questionable leaking.  3. History of lupus on 50 mg prednisone daily.  4. History of nephritis with elevated 24 hour urine protein.   DISCHARGE DIAGNOSES:  1. Intrauterine pregnancy at 37+ weeks.  2. Deep variables in second stage.  3. Lupus.  4. Nephritis.  5. Anemia.   PROCEDURES:  1. Vacuum assisted vaginal birth.  2. Augmentation of labor with Pitocin.   HOSPITAL COURSE:  Ms. Bonis is a 40 year old gravida 1, para 0 at 15-  6/7 weeks who presented on the evening of December 11 with a complaint  of pinkish light red discharge and vaginal bleeding.  She had been seen  in the office early in the day for an NST which was reactive.  Cervix  previously was closed in the office.  On admission blood pressures were  within normal limits.  Fetal heart rate was reactive.  There was  occasional variable noted.  Uterine contractions were 3-8 minutes apart.  She was having questionable leaking of fluid.  Nitrazine and fern were  negative.  However cervix was 1-2, 60%, vertex, -2 with clinical  evidence of fluid leaking.  The decision was made to admit the patient  for observation.  Early in the morning of December 12 cervix was 2-3,  75%, vertex, -1/-2.  They were now unable to document positive fern.  Pitocin was begun at 4 a.m. due to minimal contraction activity.  The  patient then progressed to completely dilated at approximately 11:02.  She did have some deep variables with  pushing.  She was consented for  vacuum extraction.  Dr. Cletis Media placed a mushroom vacuum and was able to  assist with delivery to just prior to delivery and then the baby did  deliver spontaneously.  There was a double nuchal cord noted that was  reduced.  Midline episiotomy was performed secondary to fetal  bradycardia.  This was repaired in the usual fashion.  There were  bilateral vulvar and periurethral lacerations which were also repaired.  The vaginal mucosa was fairly friable probably secondary to the  patient's lupus.  The patient did have a Foley placed and that was left  in place until the next morning.   FINDINGS:  Were a viable female by the name of Jeneen Rinks.  Weight 5 pounds 9  ounces.  Apgars were 9 and 9.  The infant was taken to the full-term  nursery.  Mother was taken to recovery in good condition.   By postpartum day #1 the patient was doing well.  She did have some  dizziness when she was sitting for sitz  bath.  She had no other problems  prior to that time.  She was breast-feeding.  Orthostatic vital signs  were normal.  Hemoglobin on that day was 6.4 down from 10.5, hematocrit  was 19, white blood cell count was 12.9 and platelet count was 134 down  from 190.  The patient was advised of the option of blood transfusion.  She did decline this.  She was placed on iron.  By the next day  postpartum day #2 she was doing well.  She was up ad lib.  She was not  having any dizziness or syncope.  Orthostatics were normal.  She  declined transfusion again.  Breast feeding was going well.  Her  perineum was healing.  She was deemed to receive full benefit of her  hospital stay and was discharged home.   DISCHARGE INSTRUCTIONS:  Per [facility] handout.   DISCHARGE MEDICATIONS:  Percocet 5/325 one to two p.o. q.3-4 h p.r.n.  pain.  The patient may also alternate this with extra strength Tylenol.  Motrin was not given secondary to the patient's lupus.  Prednisone 10 mg   one p.o. daily will be continued.  Ferrous sulfate one p.o. b.i.d. will  be also be continued.  The patient was undecided regarding  contraception.   FOLLOWUP:  Discharge followup will occur in 1 week with the patient's  rheumatologist, Dr. Charlestine Night, and then the patient will follow up with  [facility] at 6 weeks or p.r.n.  She is to call with any  issues of syncope, dizziness, increased bleeding, etc.      Cathlean Marseilles. Cira Servant, C.N.M.      Dede Query Rivard, M.D.  Electronically Signed    VLL/MEDQ  D:  06/29/2007  T:  06/30/2007  Job:  [id number]

## 2010-12-01 NOTE — H&P (Signed)
NAME:  Porter, Allison            ACCOUNT NO.:  [id number]   MEDICAL RECORD NO.:  [id number]          PATIENT TYPE:  INP   LOCATION:  1825                         FACILITY:  [facility]   PHYSICIAN:  Mobolaji B. Maia Petties, M.D.DATE OF BIRTH:  [birth year]   DATE OF ADMISSION:  12/12/2005  DATE OF DISCHARGE:                                HISTORY & PHYSICAL   PRIMARY CARE PHYSICIAN:  Harden Mo, M.D.   NEPHROLOGIST:  Louis Meckel, M.D.   RHEUMATOLOGIST:  Lindaann Slough, M.D.   CHIEF COMPLAINT:  Headaches and fever since five days ago.   HISTORY OF PRESENT ILLNESS:  [name] is a 40 year old African American  female with history of lupus, diagnosed in 2002.  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  She has been under the care of Dr.  Charlestine Night.  He last saw her one month ago.   The patient was in her usual state of health until five days ago when she  developed headaches.  There was no change in her vision.  She has  subsequently developed fever and saw Dr. Charlestine Night in the office.  She had 3+  proteinuria and she was given doxycycline.  She has vomited once since onset  of illness and has diarrhea two to three loose stools for two days.  Diarrhea appears to have subsided now.  The patient developed dizziness at  work today and she became short of breath on exertion.  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  One of  her colleagues brought her to the emergency room.  She was initially  evaluated and had a urinalysis which showed white cell of 7-10.  She was  given treatment for urinary tract infection with ciprofloxacin.  The patient  was discharged home.  While at home her husband went into the bedroom to ask  her some questions and the patient was just staring at him and unresponsive  although she was awake.  He called EMS and on arrival by EMS, the patient  was apparently confused. She could not  remember her date of birth, and she  was brought back to the emergency room.  She had initial laboratory data  done which showed hemoglobin of 5.7, platelets of 13.  Morphology showed  marked schistocytes greater than 10 per high-power field.  She has  creatinine of 1.5.  AST and bilirubin are elevated.   The patient is now oriented to time, place and person and was able to give  adequate history.   REVIEW OF SYSTEMS:  She denies cough.  No shortness of breath at rest.  She  feels tired. She has noticed dark urine the last two days.  There is no  change in her vision.  No abdominal pain, chest pain.  She complained of  back ache.   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  She was treated with prednisone  and subsequent CellCept. She developed avascular necrosis of both hips and  since  she has undergone surgery with bone grafts to both hips.   CURRENT MEDICATIONS:  1.  She is supposed to be on Plaquenil but the patient admitted that she has      not been using it because she feels okay.  2.  Doxycycline and ciprofloxacin which were recently prescribed.   ALLERGIES:  No known drug allergies.   FAMILY HISTORY:  One aunt had lupus.  Both parents are alive.  They are  suffering from hypertension.  Mother has osteoarthritis.  She has one other  brother and sister who are well.  She is married. No child yet. She has  never had a miscarriage.   SOCIAL HISTORY:  The patient is a Patent examiner.  She does not smoke  cigarettes, use alcohol or use drugs.   PHYSICAL EXAMINATION:  VITAL SIGNS:  Temperature 99.7, blood pressure  104/65, pulse 93, respiratory rate 20, oxygen saturation 100%.  GENERAL:  On examination the patient is alert to time, place and person.  HEENT: Normocephalic, atraumatic.  Pupils equal, round, and reactive to  light.  Extraocular muscles intact.  She is clinically clear.  Not  icteric.  NECK:  No elevated JVD. No carotid bruits.  LUNGS:  Clear clinically to auscultation.  CARDIOVASCULAR:  S1 and S2 regular.  No murmur, no gallop.  ABDOMEN:  Not distended.  Soft, nontender.  No palpable organomegaly.  Bowel  sounds present.  EXTREMITIES:  No pedal edema, no calf tenderness.  SKIN:  She has petechiae hemorrhages on both lower extremities extending up  to the mid thigh.   LABORATORY DATA:  Chest x-ray showed mild interval increase in size of  pericardial silhouette.  No infiltrates.  Head CT scan negative for acute  abnormality.  Laboratory data showed creatinine 1.5.  Serum pregnancy test  negative.  Urinalysis cloudy in appearance, specific gravity 1.042.  Large  bilirubin.  Ketones 15.  Large blood.  Protein greater than 300.  Nitrites  positive.  Leukocytes moderate.  Wbc's 7-10.  Bacteria a few.  Hyaline casts  and granular casts present.  RBCs 21-50.  Bilirubin 2.4, direct 0.5,  indirect 1.9.  AST 52, ALT 24, total protein 6.6, albumin 3.6, sed rate 45,  hemoglobin 5.7, hematocrit 16.5.  MCV 84, platelets 13, neutrophils 58,  lymphocytes 53.  Morphology shows marked schistocytes greater than 10%.  Spherocytes are  present.  Sodium 140, potassium 3.4, BUN 17, glucose 93,  chloride 107, bicarb 22.   ASSESSMENT/PLAN:  1.  [name] is a 40 year old African American female with history of      lupus and lupus nephritis.  She is presenting with headaches, confusion,      marked thrombocytopenia, anemia, abnormal transaminase, elevated      bilirubin, fever, petechial hemorrhages, and schistocytes on blood smear      greater than 10%.  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  I will obtain a      DIC screen, LDH, haptoglobin, antiphospholipid antibodies.  Will consult      hematology for exchange blood transfusion.  Type and sreen 4 units      packed red blood cells 2.  Lupus.  The patient is not on any medications at this time.  She has not      been using  Plaquenil. Sed rate is 45. Checking ANA  3.  Urinary tract infection.  Will empirically start with a 1 g IV q.24h.      until urine culture is available.  The patient,  although asymptomatic      given her co-morbidity, will proceed with treatment.  4.  Hypokalemia.  Will replete with KCl 40 mEq x1.  Monitor BMET.      Mobolaji B. Maia Petties, M.D.  Electronically Signed     MBB/MEDQ  D:  12/13/2005  T:  12/13/2005  Job:  [id number]   cc:   Lindaann Slough, M.D.  [address]  [place]  [place] [address]   Louis Meckel, M.D.  Fax: [phone]   Harden Mo, M.D.  Fax: [phone]

## 2010-12-01 NOTE — Consult Note (Signed)
NAME:  [name], [name]            ACCOUNT NO.:  [id number]   MEDICAL RECORD NO.:  [id number]          PATIENT TYPE:  INP   LOCATION:  1825                         FACILITY:  [facility]   PHYSICIAN:  Lauretta I. Odogwu, M.D.DATE OF BIRTH:  09-25-1970   DATE OF CONSULTATION:  DATE OF DISCHARGE:                                   CONSULTATION   REASON FOR CONSULTATION:  Anemia/thrombocytopenia.   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  The patient has been treated in the past  with steroids, Cellcept, Imuran and is currently on Plaquenil.  She last  took her dose in March 2007.  Her disease manifest itself with mild anemia.  She has had pleurisy twice in 2000 and 2005 and nephritis in July 2002 on  initial diagnosis.  She has no skin manifestations.  She presented to the  emergency room with headaches and periodic confusion.  She noted petechiae  for 4 days duration but no bleeding.  She notes a temperature of 101 with  chills.  She states that she was diagnosed with bladder infection and was  placed on Cipro.  She currently denies urinary symptoms but notes tea  colored urine associated with back pain.   PAST MEDICAL HISTORY:  1.  Lupus nephritis.  2.  Avascular necrosis of both hips status post graft in 2003.  3.  Carpal tunnel.  4.  History of kidney biopsy.   MEDICATIONS:  1.  Plaquenil 400 mg p.o. daily.  Last took medicines in March 2007.  2.  Ciprofloxacin.   ALLERGIES:  PENICILLIN.   SOCIAL HISTORY:  The patient is married with no children.  She denies a  history of occult tobacco use.  She is a Patent examiner.   FAMILY HISTORY:  Pertinent in that her aunt died from lupus.  No family  history of TTP.   REVIEW OF SYSTEMS:  CONSTITUTIONAL:  Admits to fever and chills.  Denies  night sweats, anorexia or weight loss.  CARDIOVASCULAR:  Denies chest pain,  PND, orthopnea or ankle swelling.  RESPIRATIONS:   Denies cough, hemoptysis,  wheeze, or shortness of breath.  GI:  Denies nausea, vomiting, abdominal  pain, diarrhea or melena.  GU:  Admits to a history of frequency and dysuria  but not at present.  SKIN:  Notes petechiae with bruising.  NEUROLOGIC:  Admits to headaches.  Denies extremity weakness.   PHYSICAL EXAMINATION:  GENERAL:  The patient is currently alert and oriented  x3.  VITAL SIGNS:  Pulse 68; blood pressure 95/58; temperature 99.2; respirations  18; SATs 99% on room air.  HEENT:  Head is atraumatic and normocephalic.  Sclerae is anicteric.  Pupils  equal, round and reactive to light.  Mouth is moist with no thrush.  NECK:  Supple without thyromegaly.  CHEST:  Clear to auscultation.  CARDIOVASCULAR:  First and second heart sounds are present.  No murmurs.  ABDOMEN:  Soft and nontender with no hepatosplenomegaly and bowel sounds are  present.  EXTREMITIES:  No edema.  Pulses are present and  symmetrical.  SKIN:  Demonstrates petechiae bruising.   LABORATORY DATA:  White cell count 4, hemoglobin 5.7, hematocrit 16.5,  platelets 13, MCV 84.  Sodium 140, potassium 2.4, chloride 107, BUN 17,  creatinine 1.5, glucose 93, total bilirubin elevated at 2.4, AST 52, ALT 24,  alkaline phosphatase 43.  LDH elevated at 1,003.  ESR 45.  Review of peripheral smear demonstrates marked schistocytes with paucity of  platelets.   IMPRESSION:  1.  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  2.  History of lupus nephritis.  3.  Fever with possible UTI.  4.  Anemia/thrombopenia secondary to #1.   PLAN:  The patient will need emergency plasmapheresis and will require  placement of a Quinton catheter.  This will be done by interventional  radiology.  In the meantime, she will begin prednisone 70 mg daily with  antacid prophylaxis.  We will transfuse 3 units of packed red blood cells to  achieve a hemoglobin of 8 so that she can proceed with plasmapheresis.  The  patient  has signed a consent following full explanation of the procedure and  why it is required.  Obtain blood cultures x2 with a urinalysis and check a  chest x-ray.  Begin empiric antibiotics. Platlet transfusion is  contraindicated. Thank you for this consult.  We will follow with you.      Lauretta I. Odogwu, M.D.  Electronically Signed     LIO/MEDQ  D:  12/13/2005  T:  12/13/2005  Job:  [id number]   cc:   Lindaann Slough, M.D.  [address]  [place]  [place] [address]   Harden Mo, M.D.  Fax: [phone]

## 2010-12-01 NOTE — H&P (Signed)
NAMEGRETNA, [name]            ACCOUNT NO.:  [id number]   MEDICAL RECORD NO.:  [id number]          PATIENT TYPE:  INP   LOCATION:  [id number]                         FACILITY:  [facility]   PHYSICIAN:  Cyril Mourning, D.O.    DATE OF BIRTH:  [birth year]   DATE OF PROCEDURE:  DATE OF DISCHARGE:  12/18/2005                      STAT - MUST CHANGE TO CORRECT WORK TYPE   PRIMARY CARE PHYSICIAN:  Dr. Philipp Deputy.   NEPHROLOGIST:  Dr. [name].   RHEUMATOLOGIST:  Dr. Hurley Cisco.   HEMATOLOGIST:  Dr. Sophronia Simas.   REASON FOR ADMISSION:  Headaches and fevers x5 days.   DIAGNOSES AT TIME OF DISCHARGE:  1.  PTP.  2.  History of systemic lupus erythematosus diagnosed in 2002 involving her      kidneys treated with prednisone and Cellcept.  3.  History of anemia.  4.  History of avascular necrosis of both hips, status post bone grafting.   MEDICATIONS ON DISCHARGE:  She was instructed to continue her prednisone  taper as per directions per rheumatologist, Dr. Charlestine Night.  She was instructed  to follow up at dialysis center at 1 p.m. on a daily basis for  plasmapheresis.  She was instructed to keep her catheter site clean and dry  and have this checked at dialysis and discontinued after conclusion of  therapy through her hematologist, Dr. Jamse Arn.   CONSULTATIONS DURING THIS HOSPITALIZATION:  1.  Dr. Jamse Arn of Hematology/Oncology.  2.  Interventional radiology for placement of white IJ hemodialysis      catheter.  3.  Rheumatology with Dr. Charlestine Night.   HISTORY OF PRESENTING ILLNESS:  For full details, please refer to the  history and physical as dictated by Dr. __________.  However, briefly [name] is a very pleasant 40 year old African-American female with history  of lupus diagnosed in 2002.  She had a renal biopsy consistent with lupus.  She had been under the care of Dr. Charlestine Night.  She was in her usual state of  health until about 5 days prior to presentation when  she developed  headaches.  She subsequently developed fevers and was seen in her  rheumatologist's office.  She had 3+ proteinuria and was given doxicycline.  She developed some diarrhea and subsequently dizziness and shortness of  breath on exertion, and she was brought to the Emergency Department and  found to have 7-10 WBC cells in her urine.  She was treated with Cipro for a  urinary tract infection and discharged home.  While at home she developed an  unresponsive episode, as per husband.  EMS was called, and the patient was  evaluated and apparently confused.  She could not remember her date of  birth.  She was brought into the Emergency Department.  Her laboratory data  revealed hemoglobin of 5.7.  Platelet count of 13 and morphology showing  marked schistocytes greater than 10 per high power field.  She had a  creatinine of 1.5, and her AST and bilirubin were elevated.   HOSPITAL COURSE:  She was admitted, and the initial clinical suspicion was  for that of PTP.  She was ordered  a blood transfusion and then treated for a  urinary tract infection.  In addition Hematology/Oncology was promptly  consulted at which time emergent plasmapheresis was initiated.  Interventional radiology placed a IJ catheter.  She was seen by Rheumatology  in addition.  She underwent plasmapheresis on Dec 13, 2005, and continued to  receive daily plasmapheresis throughout her hospital course with a clinical  resolution and stability of her symptoms.  She tolerated this quite well.  She remained hemodynamically stable and was requesting to go home.  Dr.  Jamse Arn indicated that she could continue plasmapheresis in the outpatient  setting.  She appeared stable for discharge.   LABORATORY DATA AT DISCHARGE:  Laboratory data at time of discharge was as  follows:  She had a sodium of 140.  Potassium of 3.1.  Chloride 103.  CO2  32.  Glucose 117.  BUN 14.  Creatinine 1.  Total protein 5.2.  Albumin 3.  AST of  28.  ALT 25.  Alkaline phosphatase 45.  Total bilirubin 0.7.  LDH  181.  Other studies performed revealed a negative hepatitis B surface  antigen.  She also had inconclusive urine culture.  Blood cultures were  negative.  She received 3 units of packed red blood cells.      Cyril Mourning, D.O.  Electronically Signed     ESS/MEDQ  D:  01/31/2006  T:  01/31/2006  Job:  [id number]

## 2010-12-01 NOTE — Discharge Summary (Signed)
[name], KNABLE            ACCOUNT NO.:  [id number]   MEDICAL RECORD NO.:  [id number]          PATIENT TYPE:  INP   LOCATION:  [id number]                         FACILITY:  [facility]   PHYSICIAN:  Jacquelynn Cree, M.D.   DATE OF BIRTH:  1971-05-07   DATE OF ADMISSION:  12/12/2005  DATE OF DISCHARGE:                                 DISCHARGE SUMMARY   PRIMARY CARE PHYSICIAN:  Dr. Philipp Deputy.   NEPHROLOGIST:  Louis Meckel, M.D.   RHEUMATOLOGIST:  Lindaann Slough, M.D.   DISCHARGE DIAGNOSES:  1.  Thrombotic thrombocytopenic purpura.  2.  Anemia status post packed red blood cells.  3.  Thrombocytopenia status post plasmapheresis.  4.  Lupus erythematosus.  5.  History of lupus nephritis.  6.  Hypokalemia.  7.  Pyuria.   DISCHARGE MEDICATIONS:  To be dictated at the time of actual discharge.   BRIEF ADMISSION HISTORY OF PRESENT ILLNESS:  The patient is a 40 year old  female with a history of SLE diagnosed in 2002.  She has also had lupus  nephritis confirmed by kidney biopsy.  She was in her usual state of health  until 5 days prior to admission when she developed headaches and fever.  She  was started on some doxycycline by Dr. Charlestine Night who saw her as an outpatient.  She now reports some dizziness and shortness of breath and was therefore  brought to the emergency room for further evaluation and workup.  She was  diagnosed with an urinary tract infection and discharged home.  Her husband  became alarmed when he found her with an altered mental status and he called  EMS and the patient was brought back to the emergency department.  Her  initial laboratory data showed a hemoglobin of 5.7, a platelet count of 13  with schistocytes on smear.  She was therefore admitted with suspicion of  TTP.   PROCEDURES AND DIAGNOSTIC STUDIES:  1.  Chest x-ray on Dec 12, 2005 showed mild interval increase in size of the      cardiopericardial silhouette.  No infiltrate or edematous  changes.  2.  CT scan of the head on Dec 12, 2005 was negative.  The patient underwent      fluoroscopic -guided placement of a IJ hemodialysis catheter for      plasmapheresis on Dec 13, 2005.  3.  She underwent plasmapheresis on May 31, June 1 and June 2.   DISCHARGE LABORATORY VALUES:  To be dictated at time of actual discharge.   HOSPITAL COURSE:  1.  TTP with marked anemia and thrombocytopenia related to SLE:  The patient      was emergently seen by a hematologist who recommended emergent      plasmapheresis.  In order to accomplish this, the patient had to be      transfused with packed red blood cells to a hemoglobin of at least 8.      She did have evidence of a red blood cell antibody.  This was confirmed      by speaking with [facility] who had previously transfused her.  Nevertheless,  she was transfused with 3 units of packed red blood cells that were      negative for Duffy A FDA antigen.  This brought her up to an acceptable      hemoglobin to initiate plasmapheresis.  Quinton catheter was placed by      interventional radiology on Dec 13, 2005 and she was started on 60 mg      prednisone daily.  Her rheumatologist was contacted and he saw her on      Dec 13, 2005 and recommended increasing her steroids to Solu-Medrol 80      mg q.12 h with taper depending on platelet count.  She has had daily      volume plasma exchange with an increase in her platelet count from 15 to      73 and finally 283.  Given her excellent response, we have backed off on      the steroids and will continue with pheresis every other day at this      direction of the hematologist.  Her hemoglobin also has been stable and      is currently 9.  2.  History of lupus nephritis:  The patient's creatinine has remained      stable throughout her hospitalization.  She was initially in acute renal      failure with creatinine of 1.5 but with treatment it has declined to      0.9.  3.  Hypokalemia:  The  patient was repleted with potassium on an as-needed      basis.  4.  Pyuria:  The patient did have urine cultures done which grew 45,000      colonies of coagulase-negative staph.  She had asymptomatic pyuria.  She      was treated with 3 days of Rocephin, however, the culture shows that the      staph is resistant to both oxacillin and penicillin.  We will repeat a      urinalysis and culture if indicated treat her, but as I said, she      remains asymptomatic.   DISPOSITION:  The patient will be discharged home when her counts remained  stable and she is cleared for discharge by both her rheumatologist and  hematologist.      Jacquelynn Cree, M.D.  Electronically Signed     CR/MEDQ  D:  12/16/2005  T:  12/17/2005  Job:  [id number]   cc:   Harden Mo, M.D.  Fax: [phone]   Louis Meckel, M.D.  Fax: [phone]   Lindaann Slough, M.D.  [place] [phone]

## 2011-01-19 ENCOUNTER — Emergency Department (HOSPITAL_COMMUNITY)
Admission: EM | Admit: 2011-01-19 | Discharge: 2011-01-19 | Disposition: A | Discharge status: Home / Self Care | Payer: BC Managed Care – PPO | Attending: Emergency Medicine | Admitting: Emergency Medicine

## 2011-01-19 DIAGNOSIS — M2550 Pain in unspecified joint: Secondary | ICD-10-CM | POA: Insufficient documentation

## 2011-01-19 DIAGNOSIS — Z79899 Other long term (current) drug therapy: Secondary | ICD-10-CM | POA: Insufficient documentation

## 2011-01-19 DIAGNOSIS — M329 Systemic lupus erythematosus, unspecified: Secondary | ICD-10-CM | POA: Insufficient documentation

## 2011-04-20 LAB — CROSSMATCH
ABO/RH(D): A POS
Antibody Screen: POSITIVE
DAT, IgG: NEGATIVE

## 2011-04-23 LAB — COMPREHENSIVE METABOLIC PANEL
AST: 24
Albumin: 2.7 — ABNORMAL LOW
Calcium: 9.4
Creatinine, Ser: 0.84
GFR calc Af Amer: >60
Total Protein: 6.3

## 2011-04-23 LAB — CBC
HCT: 19 — ABNORMAL LOW
Hemoglobin: 6.4 — CL
MCHC: 34
MCV: 91.7
Platelets: 134 — ABNORMAL LOW
Platelets: 190
WBC: 12.9 — ABNORMAL HIGH
WBC: 9.1

## 2011-04-23 LAB — RPR: RPR Ser Ql: NONREACTIVE

## 2011-07-27 ENCOUNTER — Other Ambulatory Visit (HOSPITAL_COMMUNITY): Payer: Self-pay | Admitting: Nephrology

## 2011-07-27 DIAGNOSIS — M329 Systemic lupus erythematosus, unspecified: Secondary | ICD-10-CM

## 2011-07-27 DIAGNOSIS — R809 Proteinuria, unspecified: Secondary | ICD-10-CM

## 2011-08-06 ENCOUNTER — Other Ambulatory Visit (HOSPITAL_COMMUNITY): Payer: BC Managed Care – PPO

## 2011-08-06 ENCOUNTER — Encounter (HOSPITAL_COMMUNITY): Payer: Self-pay | Admitting: Pharmacy Technician

## 2011-08-08 ENCOUNTER — Other Ambulatory Visit: Payer: Self-pay | Admitting: Radiology

## 2011-08-14 ENCOUNTER — Telehealth: Payer: Self-pay

## 2011-08-14 ENCOUNTER — Other Ambulatory Visit: Payer: Self-pay | Admitting: Radiology

## 2011-08-14 NOTE — Telephone Encounter (Signed)
.  [facility]  PT IS REQUESTING HER MEDS FOR CELLCEPT BE CALLED IN TO [facility]  CB [phone]

## 2011-08-17 ENCOUNTER — Ambulatory Visit (HOSPITAL_COMMUNITY)
Admission: RE | Admit: 2011-08-17 | Discharge: 2011-08-17 | Disposition: A | Discharge status: Home / Self Care | Payer: BC Managed Care – PPO | Source: Ambulatory Visit | Attending: Nephrology | Admitting: Nephrology

## 2011-08-17 ENCOUNTER — Other Ambulatory Visit: Payer: Self-pay | Admitting: Interventional Radiology

## 2011-08-17 DIAGNOSIS — Z79899 Other long term (current) drug therapy: Secondary | ICD-10-CM | POA: Insufficient documentation

## 2011-08-17 DIAGNOSIS — M329 Systemic lupus erythematosus, unspecified: Secondary | ICD-10-CM | POA: Insufficient documentation

## 2011-08-17 DIAGNOSIS — N058 Unspecified nephritic syndrome with other morphologic changes: Secondary | ICD-10-CM | POA: Insufficient documentation

## 2011-08-17 DIAGNOSIS — R809 Proteinuria, unspecified: Secondary | ICD-10-CM

## 2011-08-17 LAB — PROTIME-INR: Prothrombin Time: 12.5 seconds (ref 11.6–15.2)

## 2011-08-17 LAB — CBC
Hemoglobin: 10 g/dL — ABNORMAL LOW (ref 12.0–15.0)
MCH: 29.2 pg (ref 26.0–34.0)
MCV: 92.1 fL (ref 78.0–100.0)
RBC: 3.42 MIL/uL — ABNORMAL LOW (ref 3.87–5.11)

## 2011-08-17 MED ORDER — MIDAZOLAM HCL 5 MG/5ML IJ SOLN
INTRAMUSCULAR | Status: DC | PRN
  Administered 2011-08-17: 1 mg via INTRAVENOUS

## 2011-08-17 MED ORDER — SODIUM CHLORIDE 0.9 % IV SOLN
INTRAVENOUS | Status: DC | PRN
  Administered 2011-08-17: 50 mL/h via INTRAVENOUS

## 2011-08-17 MED ORDER — SODIUM CHLORIDE 0.9 % IV SOLN
INTRAVENOUS | Status: DC
  Administered 2011-08-17: 09:00:00 via INTRAVENOUS

## 2011-08-17 MED ORDER — FENTANYL CITRATE 0.05 MG/ML IJ SOLN
INTRAMUSCULAR | Status: DC | PRN
  Administered 2011-08-17: 25 ug via INTRAVENOUS

## 2011-08-17 NOTE — Progress Notes (Signed)
IV attempted x2 without success/another Rn tried x1 without success. IV team paged.

## 2011-08-17 NOTE — H&P (Signed)
[name] is an 41 y.o. female.   Chief Complaint: proteinuria; Lupus HPI: scheduled for Random Renal Biopsy now  No past medical history on file.  No past surgical history on file.  No family history on file. Social History:  does not have a smoking history on file. She does not have any smokeless tobacco history on file. Her alcohol and drug histories not on file.  Allergies:  Allergies  Allergen Reactions  . Penicillins Hives    Medications Prior to Admission  Medication Sig Dispense Refill  . furosemide (LASIX) 20 MG tablet Take 20-40 mg by mouth 2 (two) times daily as needed. Water retention      . hydroxychloroquine (PLAQUENIL) 200 MG tablet Take by mouth 2 (two) times daily.      Marland Kitchen lisinopril (PRINIVIL,ZESTRIL) 5 MG tablet Take 5 mg by mouth daily.      . mycophenolate (CELLCEPT) 500 MG tablet Take 1,500 mg by mouth 2 (two) times daily.      . predniSONE (DELTASONE) 20 MG tablet Take 40 mg by mouth daily.       Marland Kitchen alendronate (FOSAMAX) 70 MG tablet Take 70 mg by mouth every 7 (seven) days. Take with a full glass of water on an empty stomach on Sunday       Medications Prior to Admission  Medication Dose Route Frequency Provider Last Rate Last Dose  . 0.9 %  sodium chloride infusion   Intravenous Continuous Lavonia Drafts, PA        No results found for this or any previous visit (from the past 48 hour(s)). No results found.  Review of Systems  Constitutional: Negative for fever.  Respiratory: Negative for cough.   Cardiovascular: Negative for chest pain.  Gastrointestinal: Negative for nausea, vomiting and abdominal pain.  Neurological: Negative for headaches.    Blood pressure 124/82, pulse 70, temperature 97.7 F (36.5 C), resp. rate 20, height 5' 7.5" (1.715 m), weight 193 lb (87.544 kg), SpO2 100.00%. Physical Exam  Constitutional: She is oriented to person, place, and time. She appears well-developed and well-nourished.  HENT:  Head: Normocephalic.    Eyes: EOM are normal.  Neck: Normal range of motion.  Cardiovascular: Normal rate, regular rhythm and normal heart sounds.   No murmur heard. Respiratory: Effort normal and breath sounds normal. She has no wheezes.  GI: Soft. Bowel sounds are normal. There is no tenderness.  Musculoskeletal: Normal range of motion.  Neurological: She is alert and oriented to person, place, and time.  Skin: Skin is warm and dry.     Assessment/Plan Hx lupus; proteinuria Has had renal biopsy approx 10 yrs ago Scheduled for biopsy today in Radiology Aware of procedure benefits and risks and agreeable to proceed. Consent signed.  Bayle Calvo A 08/17/2011, 9:10 AM

## 2011-08-17 NOTE — ED Notes (Signed)
Bandaid left flank clean dry intact

## 2011-08-17 NOTE — Procedures (Signed)
[place] LT renal cortex core bx 16g x 2 No comp Stable Full report dictated in PACS

## 2011-08-17 NOTE — ED Notes (Addendum)
In nurses station. No pain. VS stable. Bandaid left flank clean dry intact. Prone position.

## 2011-08-17 NOTE — ED Notes (Signed)
Taking fluids and crackers. Family in.  Bandaid left flank clean dry intact.

## 2011-08-17 NOTE — ED Notes (Signed)
bandaid left flank clean dry and intact

## 2011-08-17 NOTE — ED Notes (Signed)
Bandaid left flank clean dry intact. Report to Mayford Knife RN

## 2011-08-18 ENCOUNTER — Encounter: Payer: Self-pay | Admitting: Obstetrics and Gynecology

## 2011-08-18 DIAGNOSIS — M329 Systemic lupus erythematosus, unspecified: Secondary | ICD-10-CM

## 2011-08-18 NOTE — Telephone Encounter (Signed)
Pt has never been seen here before so I believe she called the wrong office.

## 2011-08-19 NOTE — Telephone Encounter (Signed)
Called patient and left message advising below.  CB if questions.

## 2011-08-20 ENCOUNTER — Telehealth (HOSPITAL_COMMUNITY): Payer: Self-pay

## 2011-09-24 ENCOUNTER — Ambulatory Visit (INDEPENDENT_AMBULATORY_CARE_PROVIDER_SITE_OTHER): Payer: BC Managed Care – PPO | Admitting: Obstetrics and Gynecology

## 2011-09-24 DIAGNOSIS — Z01419 Encounter for gynecological examination (general) (routine) without abnormal findings: Secondary | ICD-10-CM

## 2011-09-24 DIAGNOSIS — L659 Nonscarring hair loss, unspecified: Secondary | ICD-10-CM

## 2011-10-15 DIAGNOSIS — R7303 Prediabetes: Secondary | ICD-10-CM

## 2011-10-15 HISTORY — DX: Prediabetes: R73.03

## 2011-12-04 ENCOUNTER — Ambulatory Visit (INDEPENDENT_AMBULATORY_CARE_PROVIDER_SITE_OTHER): Payer: BC Managed Care – PPO | Admitting: Obstetrics and Gynecology

## 2011-12-04 ENCOUNTER — Encounter: Payer: Self-pay | Admitting: Obstetrics and Gynecology

## 2011-12-04 VITALS — BP 120/70 | Resp 14

## 2011-12-04 DIAGNOSIS — Z309 Encounter for contraceptive management, unspecified: Secondary | ICD-10-CM

## 2011-12-04 MED ORDER — NORETHIN-ETH ESTRAD-FE BIPHAS 1 MG-10 MCG / 10 MCG PO TABS: 1.0000 | ORAL_TABLET | Freq: Once | ORAL | Status: DC

## 2011-12-04 NOTE — Progress Notes (Signed)
F/U Loestrin FE   Here after initiation of Lo Loestrin FE for contraception due to lupus. Started in 3/13.  Doing well, no SE. BP and weight stable. Cycles light. Going to [facility] for consult for proteinuria in September.  Will Rx Lo Loestrin FE x 1 year. F/U 3/14 for annual or prn  Donnel Saxon, CNM, [place]

## 2012-04-14 ENCOUNTER — Other Ambulatory Visit (HOSPITAL_COMMUNITY): Payer: Self-pay | Admitting: *Deleted

## 2012-04-15 ENCOUNTER — Encounter (HOSPITAL_COMMUNITY): Payer: Self-pay

## 2012-04-15 ENCOUNTER — Encounter (HOSPITAL_COMMUNITY)
Admission: RE | Admit: 2012-04-15 | Discharge: 2012-04-15 | Disposition: A | Discharge status: Home / Self Care | Payer: BC Managed Care – PPO | Source: Ambulatory Visit | Attending: Nephrology | Admitting: Nephrology

## 2012-04-15 DIAGNOSIS — M329 Systemic lupus erythematosus, unspecified: Secondary | ICD-10-CM | POA: Insufficient documentation

## 2012-04-15 HISTORY — DX: Essential (primary) hypertension: I10

## 2012-04-15 HISTORY — DX: Systemic lupus erythematosus, unspecified: M32.9

## 2012-04-15 HISTORY — DX: Reserved for concepts with insufficient information to code with codable children: IMO0002

## 2012-04-15 MED ORDER — DIPHENHYDRAMINE HCL 50 MG/ML IJ SOLN: 25.0000 mg | INTRAMUSCULAR | Status: DC | PRN

## 2012-04-15 MED ORDER — SODIUM CHLORIDE 0.9 % IV SOLN
200.0000 mg | INTRAVENOUS | Status: DC
  Administered 2012-04-15: 200 mg via INTRAVENOUS
  Filled 2012-04-15: qty 2

## 2012-04-15 MED ORDER — ONDANSETRON 8 MG/NS 50 ML IVPB
8.0000 mg | INTRAVENOUS | Status: DC
  Administered 2012-04-15: 8 mg via INTRAVENOUS
  Filled 2012-04-15: qty 8

## 2012-04-15 MED ORDER — SODIUM CHLORIDE 0.9 % IV SOLN
Freq: Every day | INTRAVENOUS | Status: DC
  Administered 2012-04-15: 1000 mL via INTRAVENOUS

## 2012-04-15 MED ORDER — SODIUM CHLORIDE 0.9 % IV SOLN
500.0000 mg | INTRAVENOUS | Status: DC
  Administered 2012-04-15: 500 mg via INTRAVENOUS
  Filled 2012-04-15: qty 25

## 2012-04-25 ENCOUNTER — Other Ambulatory Visit (HOSPITAL_COMMUNITY): Payer: Self-pay | Admitting: *Deleted

## 2012-04-29 ENCOUNTER — Encounter (HOSPITAL_COMMUNITY): Payer: Self-pay

## 2012-04-29 ENCOUNTER — Encounter (HOSPITAL_COMMUNITY)
Admission: RE | Admit: 2012-04-29 | Discharge: 2012-04-29 | Disposition: A | Discharge status: Home / Self Care | Payer: BC Managed Care – PPO | Source: Ambulatory Visit | Attending: Nephrology | Admitting: Nephrology

## 2012-04-29 MED ORDER — ONDANSETRON 8 MG/NS 50 ML IVPB
8.0000 mg | INTRAVENOUS | Status: DC
  Administered 2012-04-29 (×2): 8 mg via INTRAVENOUS
  Filled 2012-04-29: qty 8

## 2012-04-29 MED ORDER — DIPHENHYDRAMINE HCL 50 MG/ML IJ SOLN: 25.0000 mg | INTRAMUSCULAR | Status: DC | PRN

## 2012-04-29 MED ORDER — SODIUM CHLORIDE 0.9 % IV SOLN
500.0000 mg | INTRAVENOUS | Status: DC
  Administered 2012-04-29: 500 mg via INTRAVENOUS
  Filled 2012-04-29: qty 25

## 2012-04-29 MED ORDER — SODIUM CHLORIDE 0.9 % IV SOLN
Freq: Every day | INTRAVENOUS | Status: DC
  Administered 2012-04-29: 13:00:00 via INTRAVENOUS

## 2012-04-29 MED ORDER — SODIUM CHLORIDE 0.9 % IV SOLN
200.0000 mg | INTRAVENOUS | Status: DC
  Administered 2012-04-29: 200 mg via INTRAVENOUS
  Filled 2012-04-29: qty 2

## 2012-05-13 ENCOUNTER — Encounter (HOSPITAL_COMMUNITY)
Admission: RE | Admit: 2012-05-13 | Discharge: 2012-05-13 | Disposition: A | Discharge status: Home / Self Care | Payer: BC Managed Care – PPO | Source: Ambulatory Visit | Attending: Nephrology | Admitting: Nephrology

## 2012-05-13 ENCOUNTER — Encounter (HOSPITAL_COMMUNITY): Payer: Self-pay

## 2012-05-13 MED ORDER — SODIUM CHLORIDE 0.9 % IV SOLN
500.0000 mg | INTRAVENOUS | Status: DC
  Administered 2012-05-13: 500 mg via INTRAVENOUS
  Filled 2012-05-13: qty 25

## 2012-05-13 MED ORDER — SODIUM CHLORIDE 0.9 % IV SOLN
Freq: Every day | INTRAVENOUS | Status: DC
  Administered 2012-05-13: 1000 mL via INTRAVENOUS

## 2012-05-13 MED ORDER — SODIUM CHLORIDE 0.9 % IV SOLN
200.0000 mg | INTRAVENOUS | Status: DC
  Administered 2012-05-13: 200 mg via INTRAVENOUS
  Filled 2012-05-13: qty 2

## 2012-05-13 MED ORDER — ONDANSETRON 8 MG/NS 50 ML IVPB
8.0000 mg | INTRAVENOUS | Status: DC
  Administered 2012-05-13: 8 mg via INTRAVENOUS
  Filled 2012-05-13: qty 8

## 2012-05-27 ENCOUNTER — Encounter (HOSPITAL_COMMUNITY)
Admission: RE | Admit: 2012-05-27 | Discharge: 2012-05-27 | Disposition: A | Discharge status: Home / Self Care | Payer: BC Managed Care – PPO | Source: Ambulatory Visit | Attending: Nephrology | Admitting: Nephrology

## 2012-05-27 ENCOUNTER — Encounter (HOSPITAL_COMMUNITY): Payer: Self-pay

## 2012-05-27 DIAGNOSIS — M329 Systemic lupus erythematosus, unspecified: Secondary | ICD-10-CM | POA: Insufficient documentation

## 2012-05-27 MED ORDER — SODIUM CHLORIDE 0.9 % IV SOLN
INTRAVENOUS | Status: DC
  Administered 2012-05-27: 14:00:00 via INTRAVENOUS

## 2012-05-27 MED ORDER — ONDANSETRON 8 MG/NS 50 ML IVPB
8.0000 mg | INTRAVENOUS | Status: DC
  Administered 2012-05-27: 8 mg via INTRAVENOUS
  Filled 2012-05-27: qty 8

## 2012-05-27 MED ORDER — SODIUM CHLORIDE 0.9 % IV SOLN
500.0000 mg | INTRAVENOUS | Status: DC
  Administered 2012-05-27: 500 mg via INTRAVENOUS
  Filled 2012-05-27: qty 25

## 2012-05-27 MED ORDER — SODIUM CHLORIDE 0.9 % IV SOLN
Freq: Every day | INTRAVENOUS | Status: DC
  Administered 2012-05-27: 12:00:00 via INTRAVENOUS

## 2012-05-27 MED ORDER — SODIUM CHLORIDE 0.9 % IV SOLN
200.0000 mg | INTRAVENOUS | Status: DC
  Administered 2012-05-27: 200 mg via INTRAVENOUS
  Filled 2012-05-27: qty 2

## 2012-06-10 ENCOUNTER — Encounter (HOSPITAL_COMMUNITY)
Admission: RE | Admit: 2012-06-10 | Discharge: 2012-06-10 | Disposition: A | Discharge status: Home / Self Care | Payer: BC Managed Care – PPO | Source: Ambulatory Visit | Attending: Nephrology | Admitting: Nephrology

## 2012-06-10 ENCOUNTER — Encounter (HOSPITAL_COMMUNITY): Payer: Self-pay

## 2012-06-10 MED ORDER — SODIUM CHLORIDE 0.9 % IV SOLN: INTRAVENOUS | Status: DC

## 2012-06-10 MED ORDER — DIPHENHYDRAMINE HCL 50 MG/ML IJ SOLN: 25.0000 mg | INTRAMUSCULAR | Status: DC | PRN

## 2012-06-10 MED ORDER — SODIUM CHLORIDE 0.9 % IV SOLN
200.0000 mg | INTRAVENOUS | Status: DC
  Administered 2012-06-10: 200 mg via INTRAVENOUS
  Filled 2012-06-10: qty 2

## 2012-06-10 MED ORDER — SODIUM CHLORIDE 0.9 % IV SOLN
500.0000 mg | INTRAVENOUS | Status: DC
  Administered 2012-06-10: 500 mg via INTRAVENOUS
  Filled 2012-06-10: qty 25

## 2012-06-10 MED ORDER — SODIUM CHLORIDE 0.9 % IV SOLN
Freq: Every day | INTRAVENOUS | Status: DC
  Administered 2012-06-10: 13:00:00 via INTRAVENOUS

## 2012-06-10 MED ORDER — ONDANSETRON 8 MG/NS 50 ML IVPB
8.0000 mg | INTRAVENOUS | Status: DC
  Administered 2012-06-10: 8 mg via INTRAVENOUS
  Filled 2012-06-10: qty 8

## 2012-06-10 NOTE — Discharge Instructions (Signed)
Cyclophosphamide injection What is this medicine? CYCLOPHOSPHAMIDE (sye kloe FOSS fa mide) is a chemotherapy drug. It slows the growth of cancer cells. This medicine is used to treat many types of cancer like lymphoma, myeloma, leukemia, breast cancer, and ovarian cancer, to name a few. It is also used to treat nephrotic syndrome in children. This medicine may be used for other purposes; ask your health care provider or pharmacist if you have questions. What should I tell my health care provider before I take this medicine? They need to know if you have any of these conditions: -blood disorders -history of other chemotherapy -history of radiation therapy -infection -kidney disease -liver disease -tumors in the bone marrow -an unusual or allergic reaction to cyclophosphamide, other chemotherapy, other medicines, foods, dyes, or preservatives -pregnant or trying to get pregnant -breast-feeding How should I use this medicine? This drug is usually given as an injection into a vein or muscle or by infusion into a vein. It is administered in a hospital or clinic by a specially trained health care professional. Talk to your pediatrician regarding the use of this medicine in children. While this drug may be prescribed for selected conditions, precautions do apply. Overdosage: If you think you have taken too much of this medicine contact a poison control center or emergency room at once. NOTE: This medicine is only for you. Do not share this medicine with others. What if I miss a dose? It is important not to miss your dose. Call your doctor or health care professional if you are unable to keep an appointment. What may interact with this medicine? Do not take this medicine with any of the following medications: -mibefradil -nalidixic acid This medicine may also interact with the following medications: -doxorubicin -etanercept -medicines to increase blood counts like filgrastim, pegfilgrastim,  sargramostim -medicines that block muscle or nerve pain -St. John's Wort -phenobarbital -succinylcholine chloride -trastuzumab -vaccines Talk to your doctor or health care professional before taking any of these medicines: -acetaminophen -aspirin -ibuprofen -ketoprofen -naproxen This list may not describe all possible interactions. Give your health care provider a list of all the medicines, herbs, non-prescription drugs, or dietary supplements you use. Also tell them if you smoke, drink alcohol, or use illegal drugs. Some items may interact with your medicine. What should I watch for while using this medicine? Visit your doctor for checks on your progress. This drug may make you feel generally unwell. This is not uncommon, as chemotherapy can affect healthy cells as well as cancer cells. Report any side effects. Continue your course of treatment even though you feel ill unless your doctor tells you to stop. Drink water or other fluids as directed. Urinate often, even at night. In some cases, you may be given additional medicines to help with side effects. Follow all directions for their use. Call your doctor or health care professional for advice if you get a fever, chills or sore throat, or other symptoms of a cold or flu. Do not treat yourself. This drug decreases your body's ability to fight infections. Try to avoid being around people who are sick. This medicine may increase your risk to bruise or bleed. Call your doctor or health care professional if you notice any unusual bleeding. Be careful brushing and flossing your teeth or using a toothpick because you may get an infection or bleed more easily. If you have any dental work done, tell your dentist you are receiving this medicine. Avoid taking products that contain aspirin, acetaminophen, ibuprofen, naproxen,  or ketoprofen unless instructed by your doctor. These medicines may hide a fever. Do not become pregnant while taking this  medicine. Women should inform their doctor if they wish to become pregnant or think they might be pregnant. There is a potential for serious side effects to an unborn child. Talk to your health care professional or pharmacist for more information. Do not breast-feed an infant while taking this medicine. Men should inform their doctor if they wish to father a child. This medicine may lower sperm counts. If you are going to have surgery, tell your doctor or health care professional that you have taken this medicine. What side effects may I notice from receiving this medicine? Side effects that you should report to your doctor or health care professional as soon as possible: -allergic reactions like skin rash, itching or hives, swelling of the face, lips, or tongue -low blood counts - this medicine may decrease the number of white blood cells, red blood cells and platelets. You may be at increased risk for infections and bleeding. -signs of infection - fever or chills, cough, sore throat, pain or difficulty passing urine -signs of decreased platelets or bleeding - bruising, pinpoint red spots on the skin, black, tarry stools, blood in the urine -signs of decreased red blood cells - unusually weak or tired, fainting spells, lightheadedness -breathing problems -dark urine -mouth sores -pain, swelling, redness at site where injected -swelling of the ankles, feet, hands -trouble passing urine or change in the amount of urine -weight gain -yellowing of the eyes or skin Side effects that usually do not require medical attention (report to your doctor or health care professional if they continue or are bothersome): -changes in nail or skin color -diarrhea -hair loss -loss of appetite -missed menstrual periods -nausea, vomiting -stomach pain This list may not describe all possible side effects. Call your doctor for medical advice about side effects. You may report side effects to FDA at  1-800-FDA-1088. Where should I keep my medicine? This drug is given in a hospital or clinic and will not be stored at home. NOTE: This sheet is a summary. It may not cover all possible information. If you have questions about this medicine, talk to your doctor, pharmacist, or health care provider.  2013, [employer]. (10/07/2007 2:32:25 PM)

## 2012-06-24 ENCOUNTER — Encounter (HOSPITAL_COMMUNITY): Payer: Self-pay

## 2012-06-24 ENCOUNTER — Encounter (HOSPITAL_COMMUNITY)
Admission: RE | Admit: 2012-06-24 | Discharge: 2012-06-24 | Disposition: A | Discharge status: Home / Self Care | Payer: BC Managed Care – PPO | Source: Ambulatory Visit | Attending: Nephrology | Admitting: Nephrology

## 2012-06-24 DIAGNOSIS — M329 Systemic lupus erythematosus, unspecified: Secondary | ICD-10-CM | POA: Insufficient documentation

## 2012-06-24 MED ORDER — ONDANSETRON 8 MG/NS 50 ML IVPB
8.0000 mg | INTRAVENOUS | Status: AC
  Administered 2012-06-24: 8 mg via INTRAVENOUS
  Filled 2012-06-24: qty 8

## 2012-06-24 MED ORDER — CYCLOPHOSPHAMIDE CHEMO INJECTION 1 GM
500.0000 mg | INTRAMUSCULAR | Status: AC
  Administered 2012-06-24: 500 mg via INTRAVENOUS
  Filled 2012-06-24: qty 25

## 2012-06-24 MED ORDER — SODIUM CHLORIDE 0.9 % IV SOLN
200.0000 mg | INTRAVENOUS | Status: DC
  Administered 2012-06-24: 200 mg via INTRAVENOUS
  Filled 2012-06-24: qty 2

## 2012-06-24 MED ORDER — SODIUM CHLORIDE 0.9 % IV SOLN
INTRAVENOUS | Status: DC
  Administered 2012-06-24: 13:00:00 via INTRAVENOUS

## 2012-06-24 MED ORDER — SODIUM CHLORIDE 0.9 % IV SOLN
Freq: Once | INTRAVENOUS | Status: AC
  Administered 2012-06-24: 13:00:00 via INTRAVENOUS

## 2012-07-15 ENCOUNTER — Other Ambulatory Visit (HOSPITAL_COMMUNITY): Payer: Self-pay | Admitting: Nephrology

## 2012-07-18 ENCOUNTER — Ambulatory Visit (HOSPITAL_COMMUNITY): Admission: RE | Admit: 2012-07-18 | Payer: BC Managed Care – PPO | Source: Ambulatory Visit

## 2012-07-24 ENCOUNTER — Ambulatory Visit (HOSPITAL_COMMUNITY): Payer: BC Managed Care – PPO

## 2012-07-24 ENCOUNTER — Encounter (HOSPITAL_COMMUNITY)
Admission: RE | Admit: 2012-07-24 | Discharge: 2012-07-24 | Disposition: A | Discharge status: Home / Self Care | Payer: BC Managed Care – PPO | Source: Ambulatory Visit | Attending: Nephrology | Admitting: Nephrology

## 2012-07-24 ENCOUNTER — Encounter (HOSPITAL_COMMUNITY): Payer: Self-pay

## 2012-07-24 DIAGNOSIS — M329 Systemic lupus erythematosus, unspecified: Secondary | ICD-10-CM | POA: Insufficient documentation

## 2012-07-24 MED ORDER — SODIUM CHLORIDE 0.9 % IV SOLN
INTRAVENOUS | Status: DC
  Administered 2012-07-24: 11:00:00 via INTRAVENOUS

## 2012-07-24 MED ORDER — SODIUM CHLORIDE 0.9 % IV SOLN
800.0000 mg | INTRAVENOUS | Status: DC
  Administered 2012-07-24: 800 mg via INTRAVENOUS
  Filled 2012-07-24: qty 10

## 2012-07-24 MED ORDER — DIPHENHYDRAMINE HCL 25 MG PO TABS
25.0000 mg | ORAL_TABLET | ORAL | Status: DC
  Administered 2012-07-24: 25 mg via ORAL
  Filled 2012-07-24 (×2): qty 1

## 2012-07-24 MED ORDER — ACETAMINOPHEN 325 MG PO TABS
650.0000 mg | ORAL_TABLET | ORAL | Status: DC
  Administered 2012-07-24: 650 mg via ORAL
  Filled 2012-07-24: qty 2

## 2012-07-24 NOTE — Progress Notes (Signed)
Sore noted on left lower leg. Slightly red with no edema and drainage. Patient states she thinks she may have scratched her leg. Instructed to monitor for signs of infection.

## 2012-07-24 NOTE — Progress Notes (Signed)
Patient has no complaints after initial Benlysta infusion. Reviewed discharge instructions and patient has no further questions. Instructed to call physician for any problems.

## 2012-07-25 ENCOUNTER — Ambulatory Visit (HOSPITAL_COMMUNITY): Payer: BC Managed Care – PPO

## 2012-08-01 ENCOUNTER — Ambulatory Visit (HOSPITAL_COMMUNITY): Payer: BC Managed Care – PPO

## 2012-08-07 ENCOUNTER — Encounter (HOSPITAL_COMMUNITY)
Admission: RE | Admit: 2012-08-07 | Discharge: 2012-08-07 | Disposition: A | Discharge status: Home / Self Care | Payer: BC Managed Care – PPO | Source: Ambulatory Visit | Attending: Nephrology | Admitting: Nephrology

## 2012-08-07 ENCOUNTER — Encounter (HOSPITAL_COMMUNITY): Payer: Self-pay

## 2012-08-07 MED ORDER — DIPHENHYDRAMINE HCL 25 MG PO TABS
25.0000 mg | ORAL_TABLET | ORAL | Status: DC
  Filled 2012-08-07: qty 1

## 2012-08-07 MED ORDER — DIPHENHYDRAMINE HCL 25 MG PO CAPS
25.0000 mg | ORAL_CAPSULE | ORAL | Status: DC
  Administered 2012-08-07: 25 mg via ORAL
  Filled 2012-08-07: qty 1

## 2012-08-07 MED ORDER — ACETAMINOPHEN 325 MG PO TABS
650.0000 mg | ORAL_TABLET | ORAL | Status: DC
  Administered 2012-08-07: 650 mg via ORAL
  Filled 2012-08-07: qty 2

## 2012-08-07 MED ORDER — SODIUM CHLORIDE 0.9 % IV SOLN
800.0000 mg | INTRAVENOUS | Status: DC
  Administered 2012-08-07: 800 mg via INTRAVENOUS
  Filled 2012-08-07: qty 10

## 2012-08-07 MED ORDER — SODIUM CHLORIDE 0.9 % IV SOLN
INTRAVENOUS | Status: DC
  Administered 2012-08-07: 13:00:00 via INTRAVENOUS

## 2012-08-07 NOTE — Progress Notes (Signed)
Patient tolerated Benlysta infusion well. No signs of adverse reaction noted. Instructed to call physician for concerns once discharged from Short Stay. Verbalizes understanding.

## 2012-08-08 ENCOUNTER — Ambulatory Visit (HOSPITAL_COMMUNITY): Payer: BC Managed Care – PPO

## 2012-08-15 ENCOUNTER — Ambulatory Visit (HOSPITAL_COMMUNITY): Payer: BC Managed Care – PPO

## 2012-08-16 ENCOUNTER — Emergency Department (HOSPITAL_COMMUNITY): Payer: BC Managed Care – PPO

## 2012-08-16 ENCOUNTER — Encounter (HOSPITAL_COMMUNITY): Payer: Self-pay | Admitting: Emergency Medicine

## 2012-08-16 ENCOUNTER — Emergency Department (HOSPITAL_COMMUNITY)
Admission: EM | Admit: 2012-08-16 | Discharge: 2012-08-16 | Disposition: A | Discharge status: Home / Self Care | Payer: BC Managed Care – PPO | Attending: Emergency Medicine | Admitting: Emergency Medicine

## 2012-08-16 DIAGNOSIS — R296 Repeated falls: Secondary | ICD-10-CM | POA: Insufficient documentation

## 2012-08-16 DIAGNOSIS — M329 Systemic lupus erythematosus, unspecified: Secondary | ICD-10-CM | POA: Insufficient documentation

## 2012-08-16 DIAGNOSIS — Z8739 Personal history of other diseases of the musculoskeletal system and connective tissue: Secondary | ICD-10-CM | POA: Insufficient documentation

## 2012-08-16 DIAGNOSIS — I10 Essential (primary) hypertension: Secondary | ICD-10-CM | POA: Insufficient documentation

## 2012-08-16 DIAGNOSIS — Y92009 Unspecified place in unspecified non-institutional (private) residence as the place of occurrence of the external cause: Secondary | ICD-10-CM | POA: Insufficient documentation

## 2012-08-16 DIAGNOSIS — Y9301 Activity, walking, marching and hiking: Secondary | ICD-10-CM | POA: Insufficient documentation

## 2012-08-16 DIAGNOSIS — Z79899 Other long term (current) drug therapy: Secondary | ICD-10-CM | POA: Insufficient documentation

## 2012-08-16 DIAGNOSIS — S300XXA Contusion of lower back and pelvis, initial encounter: Secondary | ICD-10-CM

## 2012-08-16 DIAGNOSIS — S7000XA Contusion of unspecified hip, initial encounter: Secondary | ICD-10-CM | POA: Insufficient documentation

## 2012-08-16 LAB — HCG, SERUM, QUALITATIVE: Preg, Serum: NEGATIVE

## 2012-08-16 MED ORDER — ACETAMINOPHEN 325 MG PO TABS
650.0000 mg | ORAL_TABLET | Freq: Once | ORAL | Status: AC
  Administered 2012-08-16: 650 mg via ORAL
  Filled 2012-08-16: qty 2

## 2012-08-16 MED ORDER — OXYCODONE-ACETAMINOPHEN 5-325 MG PO TABS: 2.0000 | ORAL_TABLET | ORAL | Status: DC | PRN

## 2012-08-16 NOTE — ED Notes (Signed)
Pt fell this morning onto a carpeted floor.  Now c/o bilateral hip/leg pain.  Hx of lupus.  States it is difficult for her to raise or lower herself into a chair.  Urgent care sent her here.

## 2012-08-16 NOTE — ED Provider Notes (Signed)
History     CSN: [id number]  Arrival date & time 08/16/12  [id number]   First MD Initiated Contact with Patient 08/16/12 2002      Chief Complaint  Patient presents with  . Fall  . Hip Pain    (Consider location/radiation/quality/duration/timing/severity/associated sxs/prior treatment) HPI 42 year old female fell this morning at home on a carpeted floor and complains of pain to both hips especially when she walks and tries to flex her hips and she had difficulty trying to get into her SUV this afternoon due to the pain in both hips. She had no lightheadedness no amnesia no syncope no vertigo no chest pain palpitation shortness breath abdominal pain no headache no neck pain no back pain no injury to her arms no pain to her knees ankles or feet no weakness or numbness to her legs and no change in bowel or bladder control just bilateral hip pain worse with movement and better she stays still. The pain does radiate towards her posterior hips but she has no midline back pain. The injury occurred several hours ago there is no treatment prior to arrival. Her pain is moderately severe with movement and minimal to mild at rest. Past Medical History  Diagnosis Date  . Prediabetes 10/2011  . Lupus   . Hypertension   . vascular necrosis     bilat hips    Past Surgical History  Procedure Date  . Bilat hip grafts     2007    No family history on file.  History  Substance Use Topics  . Smoking status: Never Smoker   . Smokeless tobacco: Never Used  . Alcohol Use: No    OB History    Grav Para Term Preterm Abortions TAB SAB Ect Mult Living   1 1 1       1       Review of Systems 10 Systems reviewed and are negative for acute change except as noted in the HPI. Allergies  Penicillins  Home Medications   Current Outpatient Rx  Name  Route  Sig  Dispense  Refill  . ALENDRONATE SODIUM 70 MG PO TABS   Oral   Take 70 mg by mouth every 7 (seven) days. Take with a full glass of water on an  empty stomach on Sunday         . BENLYSTA IV   Intravenous   Inject into the vein.         Marland Kitchen HYDROXYCHLOROQUINE SULFATE 200 MG PO TABS   Oral   Take by mouth 2 (two) times daily.         Marland Kitchen LISINOPRIL 5 MG PO TABS   Oral   Take 5 mg by mouth daily.         Marland Kitchen MYCOPHENOLATE MOFETIL 500 MG PO TABS   Oral   Take 1,500 mg by mouth 2 (two) times daily.         Cyndie Chime ESTRAD-FE BIPHAS 1 MG-10 MCG / 10 MCG PO TABS   Oral   Take 1 tablet by mouth once.         . JUICE PLUS FIBRE PO   Oral   Take by mouth every morning. otc  supplement         . ONDANSETRON HCL 8 MG PO TABS   Oral   Take by mouth every 8 (eight) hours as needed.         Marland Kitchen PREDNISONE 20 MG PO TABS   Oral  Take 10 mg by mouth daily.          . OXYCODONE-ACETAMINOPHEN 5-325 MG PO TABS   Oral   Take 2 tablets by mouth every 4 (four) hours as needed for pain.   20 tablet   0     BP 130/80  Pulse 66  Temp 98.2 F (36.8 C) (Oral)  Resp 18  SpO2 100%  LMP 08/02/2012  Physical Exam  Nursing note and vitals reviewed. Constitutional:       Awake, alert, nontoxic appearance.  HENT:  Head: Atraumatic.  Eyes: Right eye exhibits no discharge. Left eye exhibits no discharge.  Neck: Neck supple.       Cervical spine and back are nontender including midline back  Cardiovascular: Normal rate and regular rhythm.   No murmur heard. Pulmonary/Chest: Effort normal and breath sounds normal. No respiratory distress. She has no wheezes. She has no rales. She exhibits no tenderness.  Abdominal: Soft. Bowel sounds are normal. She exhibits no mass. There is no tenderness. There is no rebound and no guarding.  Musculoskeletal: She exhibits no edema and no tenderness.       Baseline ROM, no obvious new focal weakness. Both arms are nontender. Both legs are painful with movement only at the hips with active and passive hip flexion and hip abduction of hips on examination are nontender and knees ankles  and feet are nontender. Her dorsalis pedis pulses are intact bilaterally capillary refill is less than 2 seconds and toes of both feet with normal light touch and good strength full dorsiflexion foot plantar flexion.  Neurological: She is alert.       Mental status and motor strength appears baseline for patient and situation.  Skin: No rash noted.  Psychiatric: She has a normal mood and affect.    ED Course  Procedures (including critical care time)   Labs Reviewed  HCG, SERUM, QUALITATIVE  LAB REPORT - SCANNED   Dg Hip Bilateral W/pelvis  08/16/2012  *RADIOLOGY REPORT*  Clinical Data: Bilateral hip pain after fall today.  History of bilateral avascular necrosis.  Limited range of motion of the right hip.  BILATERAL HIP WITH PELVIS - 4+ VIEW  Comparison: None.  Findings: Postoperative changes demonstrated in both hips consistent with bilateral core decompression, surgical clips, and K- wires.  There is loss of joint space in the superior acetabulum bilaterally with associated sclerosis in the acetabular rim.  There is deformity and mixed sclerosis and lucency in the femoral heads consistent with history of avascular necrosis.  Degenerative changes in both hips.  No evidence of acute fracture or subluxation.  The pelvic rim, SI joints, and symphysis pubis appear intact.  IMPRESSION: Changes in the hips consistent with postoperative change, degenerative change, and avascular necrosis.  No acute fractures demonstrated.   Original Report Authenticated By: Lucienne Capers, M.D.    Ct Pelvis Wo Contrast  08/16/2012  *RADIOLOGY REPORT*  Clinical Data: Fall.  Bilateral hip pain, right greater than left. Evaluate for radiographically occult fracture.  Previous bone graft for bilateral hip avascular necrosis.  CT PELVIS WITHOUT CONTRAST  Technique:  Multidetector CT imaging of the pelvis was performed following the standard protocol without intravenous contrast.  Comparison: None.  Findings: No evidence of  acute fracture or dislocation of either hip.  No evidence of acute pelvic fracture.  Moderate bilateral hip osteoarthritis is seen.  Chronic avascular necrosis is seen involving both femoral heads, with bilateral femoral head and neck bone grafts.  No  other significant bone abnormality identified.  Soft tissue windows show a 3.5 cm right ovarian cyst with benign features, which is most likely physiologic.  No other pelvic masses seen.  No evidence of abnormal fluid collections or inflammatory process.  No evidence of dilated pelvic bowel loops.  IMPRESSION:  1.  No evidence of acute hip fractures or dislocations. 2.  Chronic bilateral femoral head avascular necrosis, with bilateral femoral head and neck bone grafts. 3.  Moderate bilateral hip osteoarthritis. 4.  3.5 cm benign appearing right ovarian cyst, most likely physiologic in a reproductive age female.   Original Report Authenticated By: Earle Gell, M.D.      1. Pelvic contusion       MDM   Pt stable in ED with no significant deterioration in condition. Patient / Family / Caregiver informed of clinical course, understand medical decision-making process, and agree with plan.  I doubt any other EMC precluding discharge at this time including, but not necessarily limited to the following:hip Fx.       Babette Relic, MD 08/17/12 [phone]

## 2012-08-16 NOTE — ED Notes (Signed)
Pt. Is updated of the xray results.

## 2012-08-16 NOTE — ED Notes (Signed)
Patient transported to X-ray 

## 2012-08-16 NOTE — ED Notes (Signed)
Pt denies loc with fall. Denies dizziness at time of fall but unsure of mechanism of fall. Pt was carrying large binders while walking. Landed on back, had knee, hip pain post fall with inability to lift legs enough to get into her vehicle

## 2012-08-19 ENCOUNTER — Ambulatory Visit (HOSPITAL_COMMUNITY): Payer: BC Managed Care – PPO

## 2012-08-21 ENCOUNTER — Encounter (HOSPITAL_COMMUNITY): Payer: Self-pay

## 2012-08-21 ENCOUNTER — Encounter (HOSPITAL_COMMUNITY)
Admission: RE | Admit: 2012-08-21 | Discharge: 2012-08-21 | Disposition: A | Discharge status: Home / Self Care | Payer: BC Managed Care – PPO | Source: Ambulatory Visit | Attending: Nephrology | Admitting: Nephrology

## 2012-08-21 DIAGNOSIS — M329 Systemic lupus erythematosus, unspecified: Secondary | ICD-10-CM | POA: Insufficient documentation

## 2012-08-21 MED ORDER — ACETAMINOPHEN 325 MG PO TABS
650.0000 mg | ORAL_TABLET | ORAL | Status: AC
  Administered 2012-08-21: 650 mg via ORAL
  Filled 2012-08-21: qty 2

## 2012-08-21 MED ORDER — SODIUM CHLORIDE 0.9 % IV SOLN
800.0000 mg | INTRAVENOUS | Status: AC
  Administered 2012-08-21: 800 mg via INTRAVENOUS
  Filled 2012-08-21: qty 10

## 2012-08-21 MED ORDER — SODIUM CHLORIDE 0.9 % IV SOLN
INTRAVENOUS | Status: AC
  Administered 2012-08-21: 13:00:00 via INTRAVENOUS

## 2012-08-21 MED ORDER — DIPHENHYDRAMINE HCL 25 MG PO CAPS: 25.0000 mg | ORAL_CAPSULE | ORAL | Status: DC

## 2012-08-21 NOTE — Progress Notes (Signed)
Patient refused Benadryl. Tolerated Benlysta infusion well. No signs of adverse reaction noted. Verbalizes understanding of discharge instructions.

## 2012-08-22 ENCOUNTER — Ambulatory Visit (HOSPITAL_COMMUNITY): Payer: BC Managed Care – PPO

## 2012-09-15 ENCOUNTER — Other Ambulatory Visit (HOSPITAL_COMMUNITY): Payer: Self-pay | Admitting: *Deleted

## 2012-09-16 ENCOUNTER — Other Ambulatory Visit (HOSPITAL_COMMUNITY): Payer: Self-pay | Admitting: Nephrology

## 2012-09-18 ENCOUNTER — Encounter (HOSPITAL_COMMUNITY)
Admission: RE | Admit: 2012-09-18 | Discharge: 2012-09-18 | Disposition: A | Discharge status: Home / Self Care | Payer: BC Managed Care – PPO | Source: Ambulatory Visit | Attending: Nephrology | Admitting: Nephrology

## 2012-09-18 ENCOUNTER — Encounter (HOSPITAL_COMMUNITY): Payer: Self-pay

## 2012-09-18 DIAGNOSIS — M329 Systemic lupus erythematosus, unspecified: Secondary | ICD-10-CM | POA: Insufficient documentation

## 2012-09-18 LAB — CBC
HCT: 35 % — ABNORMAL LOW (ref 36.0–46.0)
Hemoglobin: 11.5 g/dL — ABNORMAL LOW (ref 12.0–15.0)
MCHC: 32.9 g/dL (ref 30.0–36.0)
RBC: 3.9 MIL/uL (ref 3.87–5.11)

## 2012-09-18 LAB — RENAL FUNCTION PANEL
BUN: 13 mg/dL (ref 6–23)
CO2: 25 mEq/L (ref 19–32)
GFR calc Af Amer: 85 mL/min — ABNORMAL LOW (ref >90)
Glucose, Bld: 106 mg/dL — ABNORMAL HIGH (ref 70–99)
Phosphorus: 2.9 mg/dL (ref 2.3–4.6)
Potassium: 3.8 mEq/L (ref 3.5–5.1)

## 2012-09-18 MED ORDER — SODIUM CHLORIDE 0.9 % IV SOLN
800.0000 mg | Freq: Once | INTRAVENOUS | Status: AC
  Administered 2012-09-18: 800 mg via INTRAVENOUS
  Filled 2012-09-18: qty 10

## 2012-09-18 MED ORDER — DIPHENHYDRAMINE HCL 25 MG PO TABS
25.0000 mg | ORAL_TABLET | Freq: Once | ORAL | Status: DC
  Filled 2012-09-18: qty 1

## 2012-09-18 MED ORDER — SODIUM CHLORIDE 0.9 % IV SOLN
INTRAVENOUS | Status: DC
  Administered 2012-09-18: 13:00:00 via INTRAVENOUS

## 2012-09-18 MED ORDER — ACETAMINOPHEN 325 MG PO TABS
650.0000 mg | ORAL_TABLET | Freq: Once | ORAL | Status: AC
  Administered 2012-09-18: 650 mg via ORAL
  Filled 2012-09-18: qty 2

## 2012-09-18 NOTE — Progress Notes (Signed)
Pt declined Benadryl po prior to [facility] states it makes her too sleepy

## 2012-10-07 ENCOUNTER — Other Ambulatory Visit: Payer: Self-pay | Admitting: Obstetrics and Gynecology

## 2012-10-08 LAB — PAP IG W/ RFLX HPV ASCU

## 2012-10-16 ENCOUNTER — Encounter (HOSPITAL_COMMUNITY)
Admission: RE | Admit: 2012-10-16 | Discharge: 2012-10-16 | Disposition: A | Discharge status: Home / Self Care | Payer: BC Managed Care – PPO | Source: Ambulatory Visit | Attending: Nephrology | Admitting: Nephrology

## 2012-10-16 ENCOUNTER — Encounter (HOSPITAL_COMMUNITY): Payer: Self-pay

## 2012-10-16 DIAGNOSIS — M329 Systemic lupus erythematosus, unspecified: Secondary | ICD-10-CM | POA: Insufficient documentation

## 2012-10-16 LAB — RENAL FUNCTION PANEL
Albumin: 2.6 g/dL — ABNORMAL LOW (ref 3.5–5.2)
Calcium: 8.6 mg/dL (ref 8.4–10.5)
GFR calc Af Amer: 73 mL/min — ABNORMAL LOW (ref >90)
GFR calc non Af Amer: 63 mL/min — ABNORMAL LOW (ref >90)
Glucose, Bld: 80 mg/dL (ref 70–99)
Phosphorus: 3.9 mg/dL (ref 2.3–4.6)
Potassium: 3.8 mEq/L (ref 3.5–5.1)
Sodium: 140 mEq/L (ref 135–145)

## 2012-10-16 LAB — CBC
Hemoglobin: 10.8 g/dL — ABNORMAL LOW (ref 12.0–15.0)
MCHC: 32.8 g/dL (ref 30.0–36.0)
Platelets: 219 10*3/uL (ref 150–400)

## 2012-10-16 MED ORDER — DIPHENHYDRAMINE HCL 25 MG PO CAPS
25.0000 mg | ORAL_CAPSULE | ORAL | Status: DC
  Filled 2012-10-16: qty 1

## 2012-10-16 MED ORDER — SODIUM CHLORIDE 0.9 % IV SOLN
800.0000 mg | INTRAVENOUS | Status: DC
  Administered 2012-10-16: 800 mg via INTRAVENOUS
  Filled 2012-10-16: qty 10

## 2012-10-16 MED ORDER — ACETAMINOPHEN 325 MG PO TABS
650.0000 mg | ORAL_TABLET | ORAL | Status: DC
  Administered 2012-10-16: 650 mg via ORAL
  Filled 2012-10-16: qty 2

## 2012-10-16 MED ORDER — SODIUM CHLORIDE 0.9 % IV SOLN
INTRAVENOUS | Status: DC
  Administered 2012-10-16: 13:00:00 via INTRAVENOUS

## 2012-11-06 ENCOUNTER — Other Ambulatory Visit: Payer: Self-pay | Admitting: Obstetrics and Gynecology

## 2012-11-06 DIAGNOSIS — Z1231 Encounter for screening mammogram for malignant neoplasm of breast: Secondary | ICD-10-CM

## 2012-11-13 ENCOUNTER — Encounter (HOSPITAL_COMMUNITY): Payer: Self-pay

## 2012-11-13 ENCOUNTER — Encounter (HOSPITAL_COMMUNITY)
Admission: RE | Admit: 2012-11-13 | Discharge: 2012-11-13 | Disposition: A | Discharge status: Home / Self Care | Payer: BC Managed Care – PPO | Source: Ambulatory Visit | Attending: Nephrology | Admitting: Nephrology

## 2012-11-13 DIAGNOSIS — M329 Systemic lupus erythematosus, unspecified: Secondary | ICD-10-CM | POA: Insufficient documentation

## 2012-11-13 LAB — CBC
HCT: 30.4 % — ABNORMAL LOW (ref 36.0–46.0)
MCHC: 34.5 g/dL (ref 30.0–36.0)
Platelets: 219 10*3/uL (ref 150–400)
RDW: 12.4 % (ref 11.5–15.5)
WBC: 3.3 10*3/uL — ABNORMAL LOW (ref 4.0–10.5)

## 2012-11-13 LAB — RENAL FUNCTION PANEL
Albumin: 2.9 g/dL — ABNORMAL LOW (ref 3.5–5.2)
Chloride: 109 mEq/L (ref 96–112)
GFR calc non Af Amer: 57 mL/min — ABNORMAL LOW (ref >90)
Phosphorus: 3.6 mg/dL (ref 2.3–4.6)
Potassium: 4.2 mEq/L (ref 3.5–5.1)
Sodium: 140 mEq/L (ref 135–145)

## 2012-11-13 MED ORDER — DIPHENHYDRAMINE HCL 25 MG PO CAPS: 25.0000 mg | ORAL_CAPSULE | ORAL | Status: DC

## 2012-11-13 MED ORDER — ACETAMINOPHEN 325 MG PO TABS
ORAL_TABLET | ORAL | Status: AC
  Filled 2012-11-13: qty 2

## 2012-11-13 MED ORDER — SODIUM CHLORIDE 0.9 % IV SOLN
800.0000 mg | INTRAVENOUS | Status: DC
  Administered 2012-11-13: 800 mg via INTRAVENOUS
  Filled 2012-11-13: qty 10

## 2012-11-13 MED ORDER — ACETAMINOPHEN 325 MG PO TABS
650.0000 mg | ORAL_TABLET | ORAL | Status: DC
  Administered 2012-11-13: 650 mg via ORAL

## 2012-11-13 MED ORDER — SODIUM CHLORIDE 0.9 % IV SOLN
INTRAVENOUS | Status: AC
  Administered 2012-11-13: 13:00:00 via INTRAVENOUS

## 2012-11-13 NOTE — Progress Notes (Signed)
Lab results (renal function panel & CBC) faxed to Dr. Shelva Majestic office. Stacey Drain

## 2012-11-18 ENCOUNTER — Ambulatory Visit (HOSPITAL_COMMUNITY)
Admission: RE | Admit: 2012-11-18 | Discharge: 2012-11-18 | Disposition: A | Discharge status: Home / Self Care | Payer: BC Managed Care – PPO | Source: Ambulatory Visit | Attending: Obstetrics and Gynecology | Admitting: Obstetrics and Gynecology

## 2012-11-18 DIAGNOSIS — Z1231 Encounter for screening mammogram for malignant neoplasm of breast: Secondary | ICD-10-CM | POA: Insufficient documentation

## 2012-12-11 ENCOUNTER — Encounter (HOSPITAL_COMMUNITY)
Admission: RE | Admit: 2012-12-11 | Discharge: 2012-12-11 | Disposition: A | Discharge status: Home / Self Care | Payer: BC Managed Care – PPO | Source: Ambulatory Visit | Attending: Nephrology | Admitting: Nephrology

## 2012-12-11 ENCOUNTER — Encounter (HOSPITAL_COMMUNITY): Payer: Self-pay

## 2012-12-11 ENCOUNTER — Other Ambulatory Visit (HOSPITAL_COMMUNITY): Payer: Self-pay | Admitting: Nephrology

## 2012-12-11 LAB — RENAL FUNCTION PANEL
Albumin: 3.2 g/dL — ABNORMAL LOW (ref 3.5–5.2)
CO2: 21 mEq/L (ref 19–32)
Calcium: 8.4 mg/dL (ref 8.4–10.5)
Chloride: 107 mEq/L (ref 96–112)
Creatinine, Ser: 1.11 mg/dL — ABNORMAL HIGH (ref 0.50–1.10)
GFR calc Af Amer: 71 mL/min — ABNORMAL LOW (ref >90)
GFR calc non Af Amer: 61 mL/min — ABNORMAL LOW (ref >90)
Sodium: 139 mEq/L (ref 135–145)

## 2012-12-11 LAB — CBC
Platelets: 229 10*3/uL (ref 150–400)
RBC: 3.43 MIL/uL — ABNORMAL LOW (ref 3.87–5.11)
RDW: 12.7 % (ref 11.5–15.5)
WBC: 3.9 10*3/uL — ABNORMAL LOW (ref 4.0–10.5)

## 2012-12-11 MED ORDER — ACETAMINOPHEN 325 MG PO TABS
650.0000 mg | ORAL_TABLET | ORAL | Status: DC
  Administered 2012-12-11: 650 mg via ORAL
  Filled 2012-12-11: qty 2

## 2012-12-11 MED ORDER — SODIUM CHLORIDE 0.9 % IV SOLN
800.0000 mg | INTRAVENOUS | Status: DC
  Administered 2012-12-11: 800 mg via INTRAVENOUS
  Filled 2012-12-11: qty 10

## 2012-12-11 MED ORDER — SODIUM CHLORIDE 0.9 % IV SOLN: INTRAVENOUS | Status: DC

## 2013-01-08 ENCOUNTER — Encounter (HOSPITAL_COMMUNITY)
Admission: RE | Admit: 2013-01-08 | Discharge: 2013-01-08 | Disposition: A | Discharge status: Home / Self Care | Payer: BC Managed Care – PPO | Source: Ambulatory Visit | Attending: Nephrology | Admitting: Nephrology

## 2013-01-08 ENCOUNTER — Encounter (HOSPITAL_COMMUNITY): Payer: Self-pay

## 2013-01-08 DIAGNOSIS — M329 Systemic lupus erythematosus, unspecified: Secondary | ICD-10-CM | POA: Insufficient documentation

## 2013-01-08 LAB — RENAL FUNCTION PANEL
Calcium: 8.2 mg/dL — ABNORMAL LOW (ref 8.4–10.5)
GFR calc Af Amer: 69 mL/min — ABNORMAL LOW (ref >90)
GFR calc non Af Amer: 59 mL/min — ABNORMAL LOW (ref >90)
Glucose, Bld: 71 mg/dL (ref 70–99)
Phosphorus: 3.2 mg/dL (ref 2.3–4.6)
Potassium: 3.9 mEq/L (ref 3.5–5.1)
Sodium: 139 mEq/L (ref 135–145)

## 2013-01-08 LAB — CBC
Hemoglobin: 9 g/dL — ABNORMAL LOW (ref 12.0–15.0)
MCH: 29.9 pg (ref 26.0–34.0)
MCHC: 33.6 g/dL (ref 30.0–36.0)
Platelets: 197 10*3/uL (ref 150–400)
RBC: 3.01 MIL/uL — ABNORMAL LOW (ref 3.87–5.11)

## 2013-01-08 MED ORDER — ACETAMINOPHEN 325 MG PO TABS
650.0000 mg | ORAL_TABLET | ORAL | Status: DC
  Administered 2013-01-08: 650 mg via ORAL
  Filled 2013-01-08: qty 2

## 2013-01-08 MED ORDER — SODIUM CHLORIDE 0.9 % IV SOLN
800.0000 mg | INTRAVENOUS | Status: DC
  Administered 2013-01-08: 800 mg via INTRAVENOUS
  Filled 2013-01-08: qty 10

## 2013-01-08 MED ORDER — SODIUM CHLORIDE 0.9 % IV SOLN
INTRAVENOUS | Status: DC
  Administered 2013-01-08: 13:00:00 via INTRAVENOUS

## 2013-01-08 NOTE — Progress Notes (Signed)
Pt states she only takes the tylenol.  She has not been taking the benadryl.  Only gave tylenol

## 2013-02-05 ENCOUNTER — Encounter (HOSPITAL_COMMUNITY)
Admission: RE | Admit: 2013-02-05 | Discharge: 2013-02-05 | Disposition: A | Discharge status: Home / Self Care | Payer: BC Managed Care – PPO | Source: Ambulatory Visit | Attending: Nephrology | Admitting: Nephrology

## 2013-02-05 ENCOUNTER — Encounter (HOSPITAL_COMMUNITY): Payer: Self-pay

## 2013-02-05 DIAGNOSIS — M329 Systemic lupus erythematosus, unspecified: Secondary | ICD-10-CM | POA: Insufficient documentation

## 2013-02-05 LAB — RENAL FUNCTION PANEL
BUN: 12 mg/dL (ref 6–23)
Calcium: 8.3 mg/dL — ABNORMAL LOW (ref 8.4–10.5)
GFR calc Af Amer: 72 mL/min — ABNORMAL LOW (ref >90)
Glucose, Bld: 82 mg/dL (ref 70–99)
Phosphorus: 3 mg/dL (ref 2.3–4.6)
Sodium: 142 mEq/L (ref 135–145)

## 2013-02-05 LAB — CBC
Hemoglobin: 8.5 g/dL — ABNORMAL LOW (ref 12.0–15.0)
MCH: 29.8 pg (ref 26.0–34.0)
MCHC: 33.1 g/dL (ref 30.0–36.0)

## 2013-02-05 MED ORDER — SODIUM CHLORIDE 0.9 % IV SOLN
INTRAVENOUS | Status: DC
  Administered 2013-02-05: 13:00:00 via INTRAVENOUS

## 2013-02-05 MED ORDER — ACETAMINOPHEN 325 MG PO TABS
650.0000 mg | ORAL_TABLET | ORAL | Status: DC
  Administered 2013-02-05: 650 mg via ORAL
  Filled 2013-02-05: qty 2

## 2013-02-05 MED ORDER — BELIMUMAB 120 MG IV SOLR
800.0000 mg | INTRAVENOUS | Status: DC
  Administered 2013-02-05: 800 mg via INTRAVENOUS
  Filled 2013-02-05: qty 10

## 2013-03-05 ENCOUNTER — Other Ambulatory Visit (HOSPITAL_COMMUNITY): Payer: Self-pay | Admitting: Nephrology

## 2013-03-05 ENCOUNTER — Encounter (HOSPITAL_COMMUNITY): Payer: Self-pay

## 2013-03-05 ENCOUNTER — Encounter (HOSPITAL_COMMUNITY)
Admission: RE | Admit: 2013-03-05 | Discharge: 2013-03-05 | Disposition: A | Discharge status: Home / Self Care | Payer: BC Managed Care – PPO | Source: Ambulatory Visit | Attending: Nephrology | Admitting: Nephrology

## 2013-03-05 DIAGNOSIS — M329 Systemic lupus erythematosus, unspecified: Secondary | ICD-10-CM | POA: Insufficient documentation

## 2013-03-05 LAB — CBC WITH DIFFERENTIAL/PLATELET
Basophils Absolute: 0 10*3/uL (ref 0.0–0.1)
Basophils Relative: 0 % (ref 0–1)
Eosinophils Absolute: 0 10*3/uL (ref 0.0–0.7)
Eosinophils Relative: 0 % (ref 0–5)
HCT: 26.9 % — ABNORMAL LOW (ref 36.0–46.0)
MCHC: 33.8 g/dL (ref 30.0–36.0)
MCV: 89.7 fL (ref 78.0–100.0)
Monocytes Absolute: 0.2 10*3/uL (ref 0.1–1.0)
RDW: 13.3 % (ref 11.5–15.5)

## 2013-03-05 LAB — RENAL FUNCTION PANEL
BUN: 13 mg/dL (ref 6–23)
Chloride: 109 mEq/L (ref 96–112)
Glucose, Bld: 79 mg/dL (ref 70–99)
Potassium: 3.8 mEq/L (ref 3.5–5.1)

## 2013-03-05 MED ORDER — SODIUM CHLORIDE 0.9 % IV SOLN
800.0000 mg | INTRAVENOUS | Status: AC
  Administered 2013-03-05: 800 mg via INTRAVENOUS
  Filled 2013-03-05: qty 10

## 2013-03-05 MED ORDER — ACETAMINOPHEN 325 MG PO TABS
650.0000 mg | ORAL_TABLET | ORAL | Status: AC
  Administered 2013-03-05: 650 mg via ORAL
  Filled 2013-03-05: qty 2

## 2013-03-05 MED ORDER — DIPHENHYDRAMINE HCL 25 MG PO CAPS
25.0000 mg | ORAL_CAPSULE | ORAL | Status: DC
  Filled 2013-03-05 (×2): qty 1

## 2013-03-05 MED ORDER — SODIUM CHLORIDE 0.9 % IV SOLN
INTRAVENOUS | Status: AC
  Administered 2013-03-05: 13:00:00 via INTRAVENOUS

## 2013-04-02 ENCOUNTER — Encounter (HOSPITAL_COMMUNITY): Payer: Self-pay

## 2013-04-02 ENCOUNTER — Encounter (HOSPITAL_COMMUNITY)
Admission: RE | Admit: 2013-04-02 | Discharge: 2013-04-02 | Disposition: A | Discharge status: Home / Self Care | Payer: BC Managed Care – PPO | Source: Ambulatory Visit | Attending: Nephrology | Admitting: Nephrology

## 2013-04-02 DIAGNOSIS — M329 Systemic lupus erythematosus, unspecified: Secondary | ICD-10-CM | POA: Insufficient documentation

## 2013-04-02 LAB — CBC WITH DIFFERENTIAL/PLATELET
Basophils Absolute: 0 10*3/uL (ref 0.0–0.1)
Basophils Relative: 0 % (ref 0–1)
Eosinophils Absolute: 0 10*3/uL (ref 0.0–0.7)
Eosinophils Relative: 0 % (ref 0–5)
HCT: 28.7 % — ABNORMAL LOW (ref 36.0–46.0)
MCH: 30.2 pg (ref 26.0–34.0)
MCHC: 34.1 g/dL (ref 30.0–36.0)
Monocytes Absolute: 0.2 10*3/uL (ref 0.1–1.0)
Neutro Abs: 2.7 10*3/uL (ref 1.7–7.7)
RDW: 12.8 % (ref 11.5–15.5)

## 2013-04-02 LAB — COMPREHENSIVE METABOLIC PANEL
ALT: 11 U/L (ref 0–35)
AST: 16 U/L (ref 0–37)
Alkaline Phosphatase: 44 U/L (ref 39–117)
CO2: 22 mEq/L (ref 19–32)
GFR calc Af Amer: 72 mL/min — ABNORMAL LOW (ref >90)
Glucose, Bld: 75 mg/dL (ref 70–99)
Potassium: 3.7 mEq/L (ref 3.5–5.1)
Sodium: 140 mEq/L (ref 135–145)
Total Protein: 6 g/dL (ref 6.0–8.3)

## 2013-04-02 MED ORDER — SODIUM CHLORIDE 0.9 % IV SOLN
800.0000 mg | INTRAVENOUS | Status: DC
  Administered 2013-04-02: 800 mg via INTRAVENOUS
  Filled 2013-04-02: qty 10

## 2013-04-02 MED ORDER — SODIUM CHLORIDE 0.9 % IV SOLN
250.0000 mL | INTRAVENOUS | Status: DC
  Administered 2013-04-02: 250 mL via INTRAVENOUS

## 2013-04-02 MED ORDER — DIPHENHYDRAMINE HCL 25 MG PO CAPS
25.0000 mg | ORAL_CAPSULE | ORAL | Status: DC
  Filled 2013-04-02: qty 1

## 2013-04-02 MED ORDER — ACETAMINOPHEN 325 MG PO TABS
650.0000 mg | ORAL_TABLET | ORAL | Status: DC
  Administered 2013-04-02: 650 mg via ORAL
  Filled 2013-04-02: qty 2

## 2013-04-02 MED ORDER — DIPHENHYDRAMINE HCL 25 MG PO TABS
25.0000 mg | ORAL_TABLET | ORAL | Status: DC
  Filled 2013-04-02: qty 1

## 2013-04-02 NOTE — Progress Notes (Signed)
Patient has edema in LLE and c/o pain near calf. RN instructs patient to call Dr Dema Severin.

## 2013-04-23 ENCOUNTER — Other Ambulatory Visit (HOSPITAL_COMMUNITY): Payer: Self-pay | Admitting: Family Medicine

## 2013-04-23 ENCOUNTER — Other Ambulatory Visit: Payer: Self-pay | Admitting: Family Medicine

## 2013-04-23 ENCOUNTER — Ambulatory Visit (HOSPITAL_COMMUNITY)
Admission: RE | Admit: 2013-04-23 | Discharge: 2013-04-23 | Disposition: A | Discharge status: Home / Self Care | Payer: BC Managed Care – PPO | Source: Ambulatory Visit | Attending: Family Medicine | Admitting: Family Medicine

## 2013-04-23 DIAGNOSIS — M7989 Other specified soft tissue disorders: Secondary | ICD-10-CM

## 2013-04-23 DIAGNOSIS — M79609 Pain in unspecified limb: Secondary | ICD-10-CM | POA: Insufficient documentation

## 2013-04-23 DIAGNOSIS — R609 Edema, unspecified: Secondary | ICD-10-CM

## 2013-04-23 NOTE — Progress Notes (Signed)
Left lower extremity venous duplex completed.  Left:  No evidence of DVT, superficial thrombosis, or Baker's cyst.  Right:  Negative for DVT in the common femoral vein.  

## 2013-04-30 ENCOUNTER — Encounter (HOSPITAL_COMMUNITY)
Admission: RE | Admit: 2013-04-30 | Discharge: 2013-04-30 | Disposition: A | Discharge status: Home / Self Care | Payer: BC Managed Care – PPO | Source: Ambulatory Visit | Attending: Nephrology | Admitting: Nephrology

## 2013-04-30 DIAGNOSIS — M329 Systemic lupus erythematosus, unspecified: Secondary | ICD-10-CM | POA: Insufficient documentation

## 2013-04-30 LAB — CBC
HCT: 26.8 % — ABNORMAL LOW (ref 36.0–46.0)
Hemoglobin: 9.2 g/dL — ABNORMAL LOW (ref 12.0–15.0)
MCV: 87.3 fL (ref 78.0–100.0)
Platelets: 175 10*3/uL (ref 150–400)
RBC: 3.07 MIL/uL — ABNORMAL LOW (ref 3.87–5.11)
WBC: 2.7 10*3/uL — ABNORMAL LOW (ref 4.0–10.5)

## 2013-04-30 LAB — DIFFERENTIAL
Basophils Absolute: 0 10*3/uL (ref 0.0–0.1)
Basophils Relative: 0 % (ref 0–1)
Eosinophils Relative: 0 % (ref 0–5)
Monocytes Absolute: 0.2 10*3/uL (ref 0.1–1.0)
Neutro Abs: 1.8 10*3/uL (ref 1.7–7.7)

## 2013-04-30 LAB — COMPREHENSIVE METABOLIC PANEL
ALT: 14 U/L (ref 0–35)
AST: 21 U/L (ref 0–37)
CO2: 23 mEq/L (ref 19–32)
Chloride: 111 mEq/L (ref 96–112)
Creatinine, Ser: 1.25 mg/dL — ABNORMAL HIGH (ref 0.50–1.10)
GFR calc Af Amer: 61 mL/min — ABNORMAL LOW (ref >90)
GFR calc non Af Amer: 53 mL/min — ABNORMAL LOW (ref >90)
Glucose, Bld: 63 mg/dL — ABNORMAL LOW (ref 70–99)
Total Bilirubin: 0.2 mg/dL — ABNORMAL LOW (ref 0.3–1.2)

## 2013-04-30 LAB — URINE MICROSCOPIC-ADD ON

## 2013-04-30 LAB — URINALYSIS, ROUTINE W REFLEX MICROSCOPIC
Bilirubin Urine: NEGATIVE
Protein, ur: >300 mg/dL — AB
Urobilinogen, UA: 0.2 mg/dL (ref 0.0–1.0)

## 2013-04-30 LAB — PHOSPHORUS: Phosphorus: 3.6 mg/dL (ref 2.3–4.6)

## 2013-04-30 MED ORDER — SODIUM CHLORIDE 0.9 % IV SOLN
800.0000 mg | INTRAVENOUS | Status: DC
  Administered 2013-04-30: 800 mg via INTRAVENOUS
  Filled 2013-04-30: qty 10

## 2013-04-30 MED ORDER — SODIUM CHLORIDE 0.9 % IV SOLN
250.0000 mL | INTRAVENOUS | Status: DC
  Administered 2013-04-30: 250 mL via INTRAVENOUS

## 2013-04-30 MED ORDER — ACETAMINOPHEN 325 MG PO TABS
650.0000 mg | ORAL_TABLET | ORAL | Status: DC
  Administered 2013-04-30: 650 mg via ORAL
  Filled 2013-04-30: qty 2

## 2013-04-30 NOTE — Progress Notes (Signed)
States she " hasn't been taking benadryl. Hasn't needed it"

## 2013-05-28 ENCOUNTER — Encounter (HOSPITAL_COMMUNITY)
Admission: RE | Admit: 2013-05-28 | Discharge: 2013-05-28 | Disposition: A | Discharge status: Home / Self Care | Payer: BC Managed Care – PPO | Source: Ambulatory Visit | Attending: Nephrology | Admitting: Nephrology

## 2013-05-28 ENCOUNTER — Encounter (HOSPITAL_COMMUNITY): Payer: Self-pay

## 2013-05-28 DIAGNOSIS — M329 Systemic lupus erythematosus, unspecified: Secondary | ICD-10-CM | POA: Insufficient documentation

## 2013-05-28 LAB — CBC WITH DIFFERENTIAL/PLATELET
Basophils Absolute: 0 10*3/uL (ref 0.0–0.1)
Eosinophils Relative: 0 % (ref 0–5)
Lymphocytes Relative: 22 % (ref 12–46)
Neutro Abs: 2.4 10*3/uL (ref 1.7–7.7)
Neutrophils Relative %: 74 % (ref 43–77)
Platelets: 170 10*3/uL (ref 150–400)
RDW: 13.3 % (ref 11.5–15.5)
WBC: 3.2 10*3/uL — ABNORMAL LOW (ref 4.0–10.5)

## 2013-05-28 LAB — COMPREHENSIVE METABOLIC PANEL
ALT: 17 U/L (ref 0–35)
AST: 23 U/L (ref 0–37)
CO2: 22 mEq/L (ref 19–32)
Calcium: 8.6 mg/dL (ref 8.4–10.5)
Chloride: 109 mEq/L (ref 96–112)
GFR calc non Af Amer: 53 mL/min — ABNORMAL LOW (ref >90)
Sodium: 139 mEq/L (ref 135–145)

## 2013-05-28 LAB — URINALYSIS, ROUTINE W REFLEX MICROSCOPIC
Glucose, UA: NEGATIVE mg/dL
Protein, ur: >300 mg/dL — AB

## 2013-05-28 LAB — PROTEIN / CREATININE RATIO, URINE
Creatinine, Urine: 111.85 mg/dL
Protein Creatinine Ratio: 2.98 — ABNORMAL HIGH (ref 0.00–0.15)
Total Protein, Urine: 333 mg/dL

## 2013-05-28 LAB — URINE MICROSCOPIC-ADD ON

## 2013-05-28 MED ORDER — DIPHENHYDRAMINE HCL 25 MG PO CAPS: 25.0000 mg | ORAL_CAPSULE | ORAL | Status: DC

## 2013-05-28 MED ORDER — BELIMUMAB 120 MG IV SOLR
800.0000 mg | INTRAVENOUS | Status: DC
  Administered 2013-05-28: 800 mg via INTRAVENOUS
  Filled 2013-05-28: qty 10

## 2013-05-28 MED ORDER — SODIUM CHLORIDE 0.9 % IV SOLN
250.0000 mL | INTRAVENOUS | Status: DC
  Administered 2013-05-28 (×2): 250 mL via INTRAVENOUS

## 2013-05-28 MED ORDER — ACETAMINOPHEN 325 MG PO TABS
650.0000 mg | ORAL_TABLET | ORAL | Status: DC
  Administered 2013-05-28: 650 mg via ORAL
  Filled 2013-05-28: qty 2

## 2013-06-25 ENCOUNTER — Inpatient Hospital Stay (HOSPITAL_COMMUNITY): Admission: RE | Admit: 2013-06-25 | Payer: BC Managed Care – PPO | Source: Ambulatory Visit

## 2013-07-06 ENCOUNTER — Encounter (HOSPITAL_COMMUNITY): Payer: Self-pay | Admitting: Emergency Medicine

## 2013-07-06 ENCOUNTER — Emergency Department (HOSPITAL_COMMUNITY)
Admission: EM | Admit: 2013-07-06 | Discharge: 2013-07-07 | Disposition: A | Discharge status: Home / Self Care | Payer: BC Managed Care – PPO | Attending: Emergency Medicine | Admitting: Emergency Medicine

## 2013-07-06 DIAGNOSIS — Z79899 Other long term (current) drug therapy: Secondary | ICD-10-CM | POA: Insufficient documentation

## 2013-07-06 DIAGNOSIS — Z7982 Long term (current) use of aspirin: Secondary | ICD-10-CM | POA: Insufficient documentation

## 2013-07-06 DIAGNOSIS — M329 Systemic lupus erythematosus, unspecified: Secondary | ICD-10-CM | POA: Insufficient documentation

## 2013-07-06 DIAGNOSIS — R05 Cough: Secondary | ICD-10-CM | POA: Insufficient documentation

## 2013-07-06 DIAGNOSIS — IMO0001 Reserved for inherently not codable concepts without codable children: Secondary | ICD-10-CM | POA: Insufficient documentation

## 2013-07-06 DIAGNOSIS — I10 Essential (primary) hypertension: Secondary | ICD-10-CM | POA: Insufficient documentation

## 2013-07-06 DIAGNOSIS — Z8679 Personal history of other diseases of the circulatory system: Secondary | ICD-10-CM | POA: Insufficient documentation

## 2013-07-06 DIAGNOSIS — Z880 Allergy status to penicillin: Secondary | ICD-10-CM | POA: Insufficient documentation

## 2013-07-06 DIAGNOSIS — R509 Fever, unspecified: Secondary | ICD-10-CM

## 2013-07-06 DIAGNOSIS — J029 Acute pharyngitis, unspecified: Secondary | ICD-10-CM | POA: Insufficient documentation

## 2013-07-06 DIAGNOSIS — J3489 Other specified disorders of nose and nasal sinuses: Secondary | ICD-10-CM | POA: Insufficient documentation

## 2013-07-06 DIAGNOSIS — R51 Headache: Secondary | ICD-10-CM | POA: Insufficient documentation

## 2013-07-06 DIAGNOSIS — R059 Cough, unspecified: Secondary | ICD-10-CM | POA: Insufficient documentation

## 2013-07-06 NOTE — ED Notes (Addendum)
Pt. reports fever with chills , joint pains, body aches , slight nasal congestion and occasional dry cough onset this afternoon . Pt. had just finished Azithromax antibiotic for URI this previous Saturday . Pt. had taken Tylenol at 8pm this evening for fever.

## 2013-07-07 ENCOUNTER — Emergency Department (HOSPITAL_COMMUNITY): Payer: BC Managed Care – PPO

## 2013-07-07 ENCOUNTER — Telehealth (HOSPITAL_COMMUNITY): Payer: Self-pay

## 2013-07-07 LAB — INFLUENZA PANEL BY PCR (TYPE A & B)
H1N1 flu by pcr: NOT DETECTED
Influenza A By PCR: NEGATIVE
Influenza B By PCR: NEGATIVE

## 2013-07-07 LAB — BASIC METABOLIC PANEL
CO2: 23 mEq/L (ref 19–32)
Calcium: 8 mg/dL — ABNORMAL LOW (ref 8.4–10.5)
Chloride: 108 mEq/L (ref 96–112)
Creatinine, Ser: 1.53 mg/dL — ABNORMAL HIGH (ref 0.50–1.10)
GFR calc Af Amer: 48 mL/min — ABNORMAL LOW (ref >90)
Potassium: 3.8 mEq/L (ref 3.5–5.1)
Sodium: 140 mEq/L (ref 135–145)

## 2013-07-07 LAB — CBC WITH DIFFERENTIAL/PLATELET
Basophils Absolute: 0 10*3/uL (ref 0.0–0.1)
Basophils Relative: 0 % (ref 0–1)
Eosinophils Relative: 0 % (ref 0–5)
Lymphocytes Relative: 5 % — ABNORMAL LOW (ref 12–46)
MCHC: 34.1 g/dL (ref 30.0–36.0)
MCV: 91.2 fL (ref 78.0–100.0)
Neutro Abs: 4.2 10*3/uL (ref 1.7–7.7)
Platelets: 172 10*3/uL (ref 150–400)
RDW: 14.5 % (ref 11.5–15.5)
WBC: 4.6 10*3/uL (ref 4.0–10.5)

## 2013-07-07 MED ORDER — ACETAMINOPHEN 325 MG PO TABS
650.0000 mg | ORAL_TABLET | Freq: Once | ORAL | Status: AC
  Administered 2013-07-07: 650 mg via ORAL
  Filled 2013-07-07: qty 2

## 2013-07-07 MED ORDER — IBUPROFEN 800 MG PO TABS
800.0000 mg | ORAL_TABLET | Freq: Once | ORAL | Status: AC
  Administered 2013-07-07: 800 mg via ORAL
  Filled 2013-07-07: qty 1

## 2013-07-07 MED ORDER — LEVOFLOXACIN 500 MG PO TABS
500.0000 mg | ORAL_TABLET | Freq: Once | ORAL | Status: AC
  Administered 2013-07-07: 500 mg via ORAL
  Filled 2013-07-07: qty 1

## 2013-07-07 MED ORDER — OSELTAMIVIR PHOSPHATE 75 MG PO CAPS: 75.0000 mg | ORAL_CAPSULE | Freq: Two times a day (BID) | ORAL | Status: DC

## 2013-07-07 MED ORDER — LEVOFLOXACIN 500 MG PO TABS: 500.0000 mg | ORAL_TABLET | Freq: Every day | ORAL | Status: DC

## 2013-07-07 NOTE — ED Notes (Signed)
Pt calling for flu swab results.  Informed still in process.

## 2013-07-07 NOTE — ED Notes (Signed)
Jeneen Rinks, MD at bedside.

## 2013-07-07 NOTE — ED Notes (Signed)
Pt is taking cyclophosphamide and azathioprine for her lupus.

## 2013-07-07 NOTE — ED Provider Notes (Signed)
CSN: [id number]     Arrival date & time 07/06/13  2135 History   First MD Initiated Contact with Patient 07/07/13 [phone]     Chief Complaint  Patient presents with  . Fever  . Chills  . Generalized Body Aches   ) HPI  Patient presents with a two-week illness. Had a cough for 7 days. Seen by her physician last week and placed on Zithromax. She has a history of lupus. Was previously on a biologic medication until the end of November. She's been on Cytoxan daily since December 2 please also a popliteal. Did not have fever with this current episode until today. She felt some cold chills. No frank riders. Continues with a cough although it is better. She became concerned and presents here. Some sinus pain and pressure. Nasal congestion. Mild sore throat. Does not feel short of breath. Nonproductive cough. No GI symptoms.  Past Medical History  Diagnosis Date  . Prediabetes 10/2011  . Lupus   . Hypertension   . vascular necrosis     bilat hips   Past Surgical History  Procedure Laterality Date  . Bilat hip grafts      2007   No family history on file. History  Substance Use Topics  . Smoking status: Never Smoker   . Smokeless tobacco: Never Used  . Alcohol Use: No   OB History   Grav Para Term Preterm Abortions TAB SAB Ect Mult Living   1 1 1       1      Review of Systems  Constitutional: Positive for fever. Negative for chills, diaphoresis, appetite change and fatigue.  HENT: Negative for mouth sores, sore throat and trouble swallowing.   Eyes: Negative for visual disturbance.  Respiratory: Positive for cough. Negative for chest tightness, shortness of breath and wheezing.   Cardiovascular: Negative for chest pain.  Gastrointestinal: Negative for nausea, vomiting, abdominal pain, diarrhea and abdominal distention.  Endocrine: Negative for polydipsia, polyphagia and polyuria.  Genitourinary: Negative for dysuria, frequency and hematuria.  Musculoskeletal: Negative for gait  problem.  Skin: Negative for color change, pallor and rash.  Neurological: Negative for dizziness, syncope, light-headedness and headaches.  Hematological: Does not bruise/bleed easily.  Psychiatric/Behavioral: Negative for behavioral problems and confusion.    Allergies  Penicillins  Home Medications   Current Outpatient Rx  Name  Route  Sig  Dispense  Refill  . alendronate (FOSAMAX) 70 MG tablet   Oral   Take 70 mg by mouth every 7 (seven) days. Take with a full glass of water on an empty stomach on Sunday         . aspirin 325 MG EC tablet   Oral   Take 325 mg by mouth daily. X 1 month for superficial leg clots         . cyclophosphamide (CYTOXAN) 50 MG tablet   Oral   Take 75 mg by mouth daily.         . furosemide (LASIX) 20 MG tablet   Oral   Take 1 tablet by mouth daily as needed for fluid.          . hydroxychloroquine (PLAQUENIL) 200 MG tablet   Oral   Take by mouth 2 (two) times daily.         . Norethindrone-Ethinyl Estradiol-Fe Biphas (LO LOESTRIN FE) 1 MG-10 MCG / 10 MCG tablet   Oral   Take 1 tablet by mouth once.         Marland Kitchen  predniSONE (DELTASONE) 5 MG tablet   Oral   Take 15 mg by mouth daily with breakfast.         . sulfamethoxazole-trimethoprim (BACTRIM DS) 800-160 MG per tablet   Oral   Take 1 tablet by mouth every Monday, Wednesday, and Friday.         . levofloxacin (LEVAQUIN) 500 MG tablet   Oral   Take 1 tablet (500 mg total) by mouth daily.   10 tablet   0   . oseltamivir (TAMIFLU) 75 MG capsule   Oral   Take 1 capsule (75 mg total) by mouth every 12 (twelve) hours.   10 capsule   0    BP 127/66  Pulse 91  Temp(Src) 100.1 F (37.8 C) (Oral)  Resp 12  Ht 5' 7.5" (1.715 m)  Wt 192 lb (87.091 kg)  BMI 29.61 kg/m2  SpO2 98%  LMP 07/04/2013 Physical Exam  Constitutional: She is oriented to person, place, and time. She appears well-developed and well-nourished. No distress.  HENT:  Head: Normocephalic.  Pharynx  is benign  Eyes: Conjunctivae are normal. Pupils are equal, round, and reactive to light. No scleral icterus.  Neck: Normal range of motion. Neck supple. No thyromegaly present.  Cardiovascular: Normal rate and regular rhythm.  Exam reveals no gallop and no friction rub.   No murmur heard. Pulmonary/Chest: Effort normal and breath sounds normal. No respiratory distress. She has no wheezes. She has no rales.  No increased worker breathing. Clear lungs. No wheezing or diminished breath sounds.  Abdominal: Soft. Bowel sounds are normal. She exhibits no distension. There is no tenderness. There is no rebound.  Musculoskeletal: Normal range of motion.  Neurological: She is alert and oriented to person, place, and time.  Skin: Skin is warm and dry. No rash noted.  Psychiatric: She has a normal mood and affect. Her behavior is normal.    ED Course  Procedures (including critical care time) Labs Review Labs Reviewed  CBC WITH DIFFERENTIAL - Abnormal; Notable for the following:    RBC 3.18 (*)    Hemoglobin 9.9 (*)    HCT 29.0 (*)    Neutrophils Relative % 92 (*)    Lymphocytes Relative 5 (*)    Lymphs Abs 0.3 (*)    All other components within normal limits  BASIC METABOLIC PANEL - Abnormal; Notable for the following:    Creatinine, Ser 1.53 (*)    Calcium 8.0 (*)    GFR calc non Af Amer 42 (*)    GFR calc Af Amer 48 (*)    All other components within normal limits  INFLUENZA PANEL BY PCR   Imaging Review Dg Chest 2 View  07/07/2013   CLINICAL DATA:  Fever and chills.  Generalized body aches.  EXAM: CHEST  2 VIEW  COMPARISON:  01/28/2009.  FINDINGS: Cardiopericardial silhouette within normal limits. Mediastinal contours normal. Trachea midline. No airspace disease or effusion. Scattered calcified granuloma.  IMPRESSION: No active cardiopulmonary disease.   Electronically Signed   By: Dereck Ligas M.D.   On: 07/07/2013 01:27    EKG Interpretation   None       MDM   1. Fever     Flu swab is obtained and pending. Obtained a flu swab despite a history of 10 days of symptoms because she just started with fever today. She did receive the flu vaccine. Negative x-ray.  Consider immunosuppression will treat her with some by mouth Levaquin. This would be good coverage  for occult pneumonia as well as her sinuses. Encouraged her to call her physician tomorrow for an update.    Tanna Furry, MD 07/07/13 [phone]

## 2013-07-07 NOTE — ED Notes (Signed)
Pt reports that she took tylenol around 2100.

## 2013-07-16 HISTORY — PX: SHOULDER SURGERY: SHX246

## 2013-09-18 ENCOUNTER — Ambulatory Visit
Admission: RE | Admit: 2013-09-18 | Discharge: 2013-09-18 | Disposition: A | Discharge status: Home / Self Care | Payer: BC Managed Care – PPO | Source: Ambulatory Visit | Attending: Family Medicine | Admitting: Family Medicine

## 2013-09-18 ENCOUNTER — Other Ambulatory Visit: Payer: Self-pay | Admitting: Family Medicine

## 2013-09-18 DIAGNOSIS — R05 Cough: Secondary | ICD-10-CM

## 2013-09-18 DIAGNOSIS — R059 Cough, unspecified: Secondary | ICD-10-CM

## 2013-10-14 ENCOUNTER — Encounter (INDEPENDENT_AMBULATORY_CARE_PROVIDER_SITE_OTHER): Payer: Self-pay

## 2013-10-14 ENCOUNTER — Encounter: Payer: Self-pay | Admitting: Internal Medicine

## 2013-10-14 ENCOUNTER — Ambulatory Visit (INDEPENDENT_AMBULATORY_CARE_PROVIDER_SITE_OTHER): Payer: BC Managed Care – PPO | Admitting: Internal Medicine

## 2013-10-14 VITALS — BP 132/74 | HR 98 | Temp 98.2°F | Ht 67.5 in | Wt 204.0 lb

## 2013-10-14 DIAGNOSIS — R05 Cough: Secondary | ICD-10-CM

## 2013-10-14 DIAGNOSIS — R059 Cough, unspecified: Secondary | ICD-10-CM

## 2013-10-14 DIAGNOSIS — R058 Other specified cough: Secondary | ICD-10-CM | POA: Insufficient documentation

## 2013-10-14 MED ORDER — FLUTTER DEVI: Status: DC

## 2013-10-14 MED ORDER — TRAMADOL HCL 50 MG PO TABS: ORAL_TABLET | ORAL | Status: DC

## 2013-10-14 MED ORDER — FAMOTIDINE 20 MG PO TABS: ORAL_TABLET | ORAL | Status: DC

## 2013-10-14 MED ORDER — LEVOFLOXACIN 750 MG PO TABS: 750.0000 mg | ORAL_TABLET | Freq: Every day | ORAL | Status: DC

## 2013-10-14 NOTE — Progress Notes (Signed)
   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 Pulmonary Consult    Referred per Dr. [name]. Pt c/o cough x 2 months. Cough is mainly dry, but occ produces minimal green to yellow sputum. Cough bothers her all day, but esp worse at night. Cough is triggered by talking, laughing or moving around too much.   green sputum in ams, a little nasal /ear congestion bilaterally  Cough is worse at bedtime On bcps and ppi and prednisone variable dose x one year for lupus  No obvious other patterns in day to day or daytime variabilty or assoc sob or cp or chest tightness, subjective wheeze overt sinus or hb symptoms. No unusual exp hx or h/o childhood pna/ asthma or knowledge of premature birth.  Sleeping ok without nocturnal  or early am exacerbation  of respiratory  c/o's or need for noct saba. Also denies any obvious fluctuation of symptoms with weather or environmental changes or other aggravating or alleviating factors except as outlined above   Current Medications, Allergies, Complete Past Medical History, Past Surgical History, Family History, and Social History were reviewed in [employer] record.             Review of Systems  Constitutional: Negative for fever, chills and unexpected weight change.  HENT: Positive for ear pain. Negative for congestion, dental problem, nosebleeds, postnasal drip, rhinorrhea, sinus pressure, sneezing, sore throat, trouble swallowing and voice change.   Eyes: Negative for visual disturbance.  Respiratory: Positive for cough. Negative for  choking and shortness of breath.   Cardiovascular: Negative for chest pain and leg swelling.  Gastrointestinal: Negative for vomiting, abdominal pain and diarrhea.  Genitourinary: Negative for difficulty urinating.  Musculoskeletal: Positive for arthralgias.  Skin: Negative for rash.  Neurological: Negative for tremors, syncope and headaches.  Hematological: Does not bruise/bleed easily.       Objective:   Physical Exam amb bf mod hoarse nad  Wt Readings from Last 3 Encounters:  10/14/13 204 lb (92.534 kg)  07/07/13 192 lb (87.091 kg)  05/28/13 190 lb 9.6 oz (86.456 kg)      HEENT: nl dentition, turbinates, and orophanx. Nl external ear canals without cough reflex   NECK :  without JVD/Nodes/TM/ nl carotid upstrokes bilaterally   LUNGS: no acc muscle use cough on insp / exp but lungs clear   CV:  RRR  no s3 or murmur or increase in P2, no edema   ABD:  soft and nontender with nl excursion in the supine position. No bruits or organomegaly, bowel sounds nl  MS:  warm without deformities, calf tenderness, cyanosis or clubbing  SKIN: warm and dry without lesions    NEURO:  alert, approp, no deficits    September 18 2013 No active cardiopulmonary disease.      Assessment & Plan:

## 2013-10-14 NOTE — Patient Instructions (Addendum)
The key to effective treatment for your cough is eliminating the non-stop cycle of cough you're stuck in long enough to let your airway heal completely and then see if there is anything still making you cough once you stop the cough suppression, but this should take no more than 5 days to figure out  First take delsym two tsp every 12 hours and supplement if needed with  tramadol 50 mg up to 2 every 4 hours to suppress the urge to cough at all or even clear your throat. Swallowing water or using ice chips/non mint and menthol containing candies (such as lifesavers or sugarless jolly ranchers) are also effective.  You should rest your voice and avoid activities that you know make you cough.  Once you have eliminated the cough for 3 straight days try reducing the tramadol first,  then the delsym as tolerated.    Pantoprazole (protonix) 40 mg   Take 30-60 min before first meal of the day and Pepcid 20 mg one bedtime until return to office - this is the best way to tell whether stomach acid is contributing to your problem.    Stop fosfamax completely for now   Use the flutter valve whenever coughing    GERD (REFLUX)  is an extremely common cause of respiratory symptoms, many times with no significant heartburn at all.    It can be treated with medication, but also with lifestyle changes including avoidance of late meals, excessive alcohol, smoking cessation, and avoid fatty foods, chocolate, peppermint, colas, red wine, and acidic juices such as orange juice.  NO MINT OR MENTHOL PRODUCTS SO NO COUGH DROPS  USE SUGARLESS CANDY INSTEAD (jolley ranchers or Stover's)  NO OIL BASED VITAMINS - use powdered substitutes.   Please see patient coordinator before you leave today  to schedule sinus CT and we will call results > neg  Levaquin 750 mg x 5 days only   Return in 2 weeks if not 100% better

## 2013-10-15 ENCOUNTER — Encounter: Payer: Self-pay | Admitting: Internal Medicine

## 2013-10-15 ENCOUNTER — Ambulatory Visit (INDEPENDENT_AMBULATORY_CARE_PROVIDER_SITE_OTHER)
Admission: RE | Admit: 2013-10-15 | Discharge: 2013-10-15 | Disposition: A | Discharge status: Home / Self Care | Payer: BC Managed Care – PPO | Source: Ambulatory Visit | Attending: Internal Medicine | Admitting: Internal Medicine

## 2013-10-15 DIAGNOSIS — R058 Other specified cough: Secondary | ICD-10-CM

## 2013-10-15 DIAGNOSIS — R05 Cough: Secondary | ICD-10-CM

## 2013-10-15 DIAGNOSIS — R059 Cough, unspecified: Secondary | ICD-10-CM

## 2013-10-15 NOTE — Progress Notes (Signed)
Quick Note:  Called and spoke to pt regarding CT results. Pt verbalized understanding and denied any further concerns or questions at this time. ______

## 2013-10-17 NOTE — Assessment & Plan Note (Addendum)
-   sinus CT 10/15/2013 > No evidence of inflammatory sinus disease.   The most common causes of chronic cough in immunocompetent adults include the following: upper airway cough syndrome (UACS), previously referred to as postnasal drip syndrome (PNDS), which is caused by variety of rhinosinus conditions; (2) asthma; (3) GERD; (4) chronic bronchitis from cigarette smoking or other inhaled environmental irritants; (5) nonasthmatic eosinophilic bronchitis; and (6) bronchiectasis.   These conditions, singly or in combination, have accounted for up to 94% of the causes of chronic cough in prospective studies.   Other conditions have constituted no >6% of the causes in prospective studies These have included bronchogenic carcinoma, chronic interstitial pneumonia, sarcoidosis, left ventricular failure, ACEI-induced cough, and aspiration from a condition associated with pharyngeal dysfunction.    Chronic cough is often simultaneously caused by more than one condition. A single cause has been found from 38 to 82% of the time, multiple causes from 18 to 62%. Multiply caused cough has been the result of three diseases up to 42% of the time.       Based on hx and exam, this is most likely:  Classic Upper airway cough syndrome, so named because it's frequently impossible to sort out how much is  CR/sinusitis with freq throat clearing (which can be related to primary GERD)   vs  causing  secondary (" extra esophageal")  GERD from wide swings in gastric pressure that occur with throat clearing, often  promoting self use of mint and menthol lozenges that reduce the lower esophageal sphincter tone and exacerbate the problem further in a cyclical fashion.   These are the same pts (now being labeled as having "irritable larynx syndrome" by some cough centers) who not infrequently have a history of having failed to tolerate ace inhibitors,  dry powder inhalers or biphosphonates or report having atypical reflux symptoms that  don't respond to standard doses of PPI , and are easily confused as having aecopd or asthma flares by even experienced allergists/ pulmonologists.   The first step is to maximize acid suppression and eliminate cyclical coughing  . Did add 5 d of levaquin as pcn allergic and descibes am green mucus but with neg sinus Ct no further pabx needed  If not 100% better return in 2 weeks to regroup   See instructions for specific recommendations which were reviewed directly with the patient who was given a copy with highlighter outlining the key components.

## 2013-10-22 ENCOUNTER — Other Ambulatory Visit: Payer: Self-pay | Admitting: Internal Medicine

## 2013-10-28 ENCOUNTER — Encounter: Payer: Self-pay | Admitting: Internal Medicine

## 2013-10-28 ENCOUNTER — Ambulatory Visit (INDEPENDENT_AMBULATORY_CARE_PROVIDER_SITE_OTHER): Payer: BC Managed Care – PPO | Admitting: Internal Medicine

## 2013-10-28 ENCOUNTER — Telehealth: Payer: Self-pay | Admitting: Internal Medicine

## 2013-10-28 ENCOUNTER — Ambulatory Visit (INDEPENDENT_AMBULATORY_CARE_PROVIDER_SITE_OTHER)
Admission: RE | Admit: 2013-10-28 | Discharge: 2013-10-28 | Disposition: A | Discharge status: Home / Self Care | Payer: BC Managed Care – PPO | Source: Ambulatory Visit | Attending: Internal Medicine | Admitting: Internal Medicine

## 2013-10-28 VITALS — BP 110/70 | HR 70 | Temp 97.6°F | Ht 67.0 in | Wt 199.8 lb

## 2013-10-28 DIAGNOSIS — R05 Cough: Secondary | ICD-10-CM

## 2013-10-28 DIAGNOSIS — R059 Cough, unspecified: Secondary | ICD-10-CM

## 2013-10-28 DIAGNOSIS — R058 Other specified cough: Secondary | ICD-10-CM

## 2013-10-28 MED ORDER — HYDROCODONE-ACETAMINOPHEN 5-325 MG PO TABS: 2.0000 | ORAL_TABLET | ORAL | Status: DC | PRN

## 2013-10-28 NOTE — Patient Instructions (Addendum)
The key to effective treatment for your cough is eliminating the non-stop cycle of cough you're stuck in long enough to let your airway heal completely and then see if there is anything still making you cough once you stop the cough suppression, but this should take no more than 5 days to figure out  First take delsym two tsp every 12 hours and supplement if needed with  Hydrocodone 5 up to every 4 h to suppress the urge to cough at all or even clear your throat. Swallowing water or using ice chips/non mint and menthol containing candies (such as lifesavers or sugarless jolly ranchers) are also effective.  You should rest your voice and avoid activities that you know make you cough.  Once you have eliminated the cough for 3 straight days try reducing the hydrocodone 5mg    then the delsym as tolerated.    Pantoprazole (protonix) 40 mg   Take 30-60 min before first meal of the day and Pepcid 20 mg one bedtime and chlortrimeton 4 mg two around 8 pm Stop fosfamax completely for now   Use the flutter valve whenever coughing  GERD (REFLUX)  is an extremely common cause of respiratory symptoms, many times with no significant heartburn at all.   It can be treated with medication, but also with lifestyle changes including avoidance of late meals, excessive alcohol, smoking cessation, and avoid fatty foods, chocolate, peppermint, colas, red wine, and acidic juices such as orange juice.  NO MINT OR MENTHOL PRODUCTS SO NO COUGH DROPS  USE SUGARLESS CANDY INSTEAD (jolley ranchers or Stover's)  NO OIL BASED VITAMINS - use powdered substitutes.     Please remember to go to the  x-ray department downstairs for your tests - we will call you with the results when they are available.  Please schedule a follow up office visit in 2 weeks, sooner if needed

## 2013-10-28 NOTE — Progress Notes (Signed)
Quick Note:  LMTCB on home and cell ______ 

## 2013-10-28 NOTE — Progress Notes (Signed)
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 Pulmonary Consult    Referred per Dr. [name]. Pt c/o cough x 2 months. Cough is mainly dry, but occ produces minimal green to yellow sputum. Cough bothers her all day, but esp worse at night. Cough is triggered by talking, laughing or moving around too much.   green sputum in ams, a little nasal /ear congestion bilaterally  Cough is worse at bedtime On bcps and ppi and prednisone variable dose x one year for lupus rec  First take delsym two tsp every 12 hours and supplement if needed with  tramadol 50 mg up to 2 every 4 hours to suppress the urge to cough at all or even clear your throat.  . Once you have eliminated the cough for 3 straight days try reducing the tramadol first,  then the delsym as tolerated.   Pantoprazole (protonix) 40 mg   Take 30-60 min before first meal of the day and Pepcid 20 mg one bedtime until return to office - this is the best way to tell whether stomach acid is contributing to your problem.   Stop fosfamax completely for now  Use the flutter valve whenever coughing GERD   Please see patient coordinator before you leave today  to schedule sinus CT and we will call results > neg Levaquin 750 mg x 5 days only    10/28/2013 f/u ov/[facility] re: refractory cough   Chief Complaint  Patient presents with  . Follow-up    Pt states that her cough is much improved. She did not cough during the day at all yesterday. Still has some cough at night. Pulled muscle in her rt side due to cough.  started hydrocodone 5 mg started 10/24/13  by Dr Dema Severin for cp and cough much better since then. Not sob unless coughing/ Not limited by breathing from desired activities    No obvious day to day or daytime variabilty or assoc chest tightness, subjective wheeze overt sinus or hb symptoms. No unusual exp hx or h/o childhood pna/ asthma or knowledge of premature birth.  Sleeping ok without nocturnal  or early am exacerbation  of respiratory  c/o's or need for noct saba. Also denies any obvious fluctuation of symptoms with weather or environmental changes or other aggravating or alleviating factors except as outlined above   Current Medications, Allergies, Complete Past Medical History, Past Surgical History, Family History, and Social History were reviewed in [employer] record.  ROS  The following are not active complaints unless bolded sore throat, dysphagia, dental problems, itching, sneezing,  nasal congestion or excess/ purulent secretions, ear ache,   fever, chills, sweats, unintended wt loss, pleuritic or exertional cp, hemoptysis,  orthopnea pnd or leg swelling, presyncope, palpitations, heartburn, abdominal pain, anorexia, nausea, vomiting, diarrhea  or change in bowel or urinary habits, change in stools or urine, dysuria,hematuria,  rash, arthralgias, visual complaints, headache, numbness weakness or ataxia or problems with walking or coordination,  change in mood/affect or memory.  Objective:   Physical Exam   amb bf nad no longer hoarse   10/28/2013        200 Wt Readings from Last 3 Encounters:  10/14/13 204 lb (92.534 kg)  07/07/13 192 lb (87.091 kg)  05/28/13 190 lb 9.6 oz (86.456 kg)      HEENT: nl dentition, turbinates, and orophanx. Nl external ear canals without cough reflex   NECK :  without JVD/Nodes/TM/ nl carotid upstrokes bilaterally   LUNGS: no acc muscle use cough on insp / exp but lungs clear   CV:  RRR  no s3 or murmur or increase in P2, no  edema   ABD:  soft and nontender with nl excursion in the supine position. No bruits or organomegaly, bowel sounds nl  MS:  warm without deformities, calf tenderness, cyanosis or clubbing  SKIN: warm and dry without lesions    NEURO:  alert, approp, no deficits    CXR  10/28/2013 : There is mild enlargement of the cardiac silhouette without pulmonary vascular congestion. There is no evidence of pneumonia.       Assessment & Plan:

## 2013-10-28 NOTE — Assessment & Plan Note (Signed)
-   sinus CT 10/15/2013 > No evidence of inflammatory sinus disease   Continue to strongy support  Classic Upper airway cough syndrome, so named because it's frequently impossible to sort out how much is  CR/sinusitis with freq throat clearing (which can be related to primary GERD)   vs  causing  secondary (" extra esophageal")  GERD from wide swings in gastric pressure that occur with throat clearing, often  promoting self use of mint and menthol lozenges that reduce the lower esophageal sphincter tone and exacerbate the problem further in a cyclical fashion.   These are the same pts (now being labeled as having "irritable larynx syndrome" by some cough centers) who not infrequently have a history of having failed to tolerate ace inhibitors,  dry powder inhalers or biphosphonates or report having atypical reflux symptoms that don't respond to standard doses of PPI , and are easily confused as having aecopd or asthma flares by even experienced allergists/ pulmonologists.   For now needs max rx for gerd/ cyclical cough then regroup in  2 weeks  See instructions for specific recommendations which were reviewed directly with the patient who was given a copy with highlighter outlining the key components.

## 2013-10-29 MED ORDER — HYDROCODONE-ACETAMINOPHEN 5-325 MG PO TABS: 2.0000 | ORAL_TABLET | ORAL | Status: DC | PRN

## 2013-10-29 NOTE — Telephone Encounter (Signed)
Pt is aware of results. Had a question about the Hydrocodone rx that she was to be given at her appointment. This was not given to her.  MW - are you okay with [place] re-printing this rx for her?

## 2013-10-29 NOTE — Telephone Encounter (Signed)
Spoke with pt. Aware RX has been left for pick up. Nothing further needed

## 2013-10-29 NOTE — Telephone Encounter (Signed)
Yes that's fine - our printer was not working so we probably missed it

## 2013-11-11 ENCOUNTER — Encounter: Payer: Self-pay | Admitting: Internal Medicine

## 2013-11-11 ENCOUNTER — Ambulatory Visit (INDEPENDENT_AMBULATORY_CARE_PROVIDER_SITE_OTHER): Payer: BC Managed Care – PPO | Admitting: Internal Medicine

## 2013-11-11 VITALS — BP 124/76 | HR 76 | Temp 97.7°F | Ht 67.0 in | Wt 206.0 lb

## 2013-11-11 DIAGNOSIS — R05 Cough: Secondary | ICD-10-CM

## 2013-11-11 DIAGNOSIS — R059 Cough, unspecified: Secondary | ICD-10-CM

## 2013-11-11 DIAGNOSIS — R058 Other specified cough: Secondary | ICD-10-CM

## 2013-11-11 MED ORDER — OXYCODONE HCL 5 MG PO TABS: 5.0000 mg | ORAL_TABLET | ORAL | Status: DC | PRN

## 2013-11-11 MED ORDER — GABAPENTIN 100 MG PO CAPS: 100.0000 mg | ORAL_CAPSULE | Freq: Three times a day (TID) | ORAL | Status: DC

## 2013-11-11 NOTE — Progress Notes (Signed)
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 Pulmonary Consult    Referred per Dr. [name]. Pt c/o cough x 2 months. Cough is mainly dry, but occ produces minimal green to yellow sputum. Cough bothers her all day, but esp worse at night. Cough is triggered by talking, laughing or moving around too much.   green sputum in ams, a little nasal /ear congestion bilaterally  Cough is worse at bedtime On bcps and ppi and prednisone variable dose x one year for lupus rec  First take delsym two tsp every 12 hours and supplement if needed with  tramadol 50 mg up to 2 every 4 hours to suppress the urge to cough at all or even clear your throat.  . Once you have eliminated the cough for 3 straight days try reducing the tramadol first,  then the delsym as tolerated.   Pantoprazole (protonix) 40 mg   Take 30-60 min before first meal of the day and Pepcid 20 mg one bedtime until return to office - this is the best way to tell whether stomach acid is contributing to your problem.   Stop fosfamax completely for now  Use the flutter valve whenever coughing GERD   Please see patient coordinator before you leave today  to schedule sinus CT and we will call results > neg Levaquin 750 mg x 5 days only    10/28/2013 f/u ov/[place] re: refractory cough   Chief Complaint  Patient presents with  . Follow-up    Pt states that her cough is much improved. She did not cough during the day at all yesterday. Still has some cough at night. Pulled muscle in her rt side  due to cough.  started hydrocodone 5 mg started 10/24/13 by Dr Dema Severin for cp and cough much better since then. Not sob unless coughing/ Not limited by breathing from desired activities   The key to effective treatment for your cough is eliminating the non-stop cycle of cough you're stuck in long enough to let your airway heal completely and then see if there is anything still making you cough once you stop the cough suppression, but this should take no more than 5 days to figure out First take delsym two tsp every 12 hours and supplement if needed with  Hydrocodone 5 up to every 4 h to suppress the urge to cough at all or even clear your throat.   Once you have eliminated the cough for 3 straight days try reducing the hydrocodone 5mg    then the delsym as tolerated.   Pantoprazole (protonix) 40 mg   Take 30-60 min before first meal of the day and Pepcid 20 mg one bedtime and chlortrimeton 4 mg two around 8 pm Stop fosfamax completely for now  Use the flutter valve whenever coughing GERD diet    11/11/2013 f/u ov/Aavya Shafer re: chronic cough on 10 mg prednisone  Chief Complaint  Patient presents with  . Follow-up    Pt states having  increased cough for the past 2 days- non prod. No new co's today.   never got to point where didn't need hydrocodone to control throat clearing  Cough is worse as day goes on but never noct R cp no longer constant but does hurt to cough      No obvious day to day or daytime variabilty or assoc sob  chest tightness, subjective wheeze overt sinus or hb symptoms. No unusual exp hx or h/o childhood pna/ asthma or knowledge of premature birth.  Sleeping ok without nocturnal  or early am exacerbation  of respiratory  c/o's or need for noct saba. Also denies any obvious fluctuation of symptoms with weather or environmental changes or other aggravating or alleviating factors except as outlined above   Current Medications, Allergies, Complete Past Medical History, Past Surgical  History, Family History, and Social History were reviewed in [employer] record.  ROS  The following are not active complaints unless bolded sore throat, dysphagia, dental problems, itching, sneezing,  nasal congestion or excess/ purulent secretions, ear ache,   fever, chills, sweats, unintended wt loss,  exertional cp, hemoptysis,  orthopnea pnd or leg swelling, presyncope, palpitations, heartburn, abdominal pain, anorexia, nausea, vomiting, diarrhea  or change in bowel or urinary habits, change in stools or urine, dysuria,hematuria,  rash, arthralgias, visual complaints, headache, numbness weakness or ataxia or problems with walking or coordination,  change in mood/affect or memory.                          Objective:   Physical Exam   amb bf nad with classic voice fatigue    10/28/2013        200 > 206  Wt Readings from Last 3 Encounters:  10/14/13 204 lb (92.534 kg)  07/07/13 192 lb (87.091 kg)  05/28/13 190 lb 9.6 oz (86.456 kg)      HEENT: nl dentition, turbinates, and orophanx. Nl external ear canals without cough reflex   NECK :  without JVD/Nodes/TM/ nl carotid upstrokes bilaterally   LUNGS: no acc muscle use cough on insp / exp but lungs clear   CV:  RRR  no s3 or murmur or increase in P2, no edema   ABD:  soft and nontender with nl excursion in the supine position. No bruits or organomegaly, bowel sounds nl  MS:  warm without deformities, calf tenderness, cyanosis or clubbing  SKIN: warm and dry without lesions    NEURO:  alert, approp, no deficits    CXR  10/28/2013 : There is mild enlargement of the cardiac silhouette without pulmonary vascular congestion. There is no evidence of pneumonia.       Assessment & Plan:

## 2013-11-11 NOTE — Patient Instructions (Addendum)
Add neurontin 100 mg three times daily as a new maintenance (automatic) medication   You need 5 straight days of no coughing at all or even clearing throat - use up to oxycodone 5 mg 2 every 4 hours x 5 straight days and candy to control urge to clear the throat.   Please schedule a follow up office visit in 4 weeks, sooner if needed

## 2013-11-11 NOTE — Assessment & Plan Note (Signed)
-   sinus CT 10/15/2013 > No evidence of inflammatory sinus disease   Classic Upper airway cough syndrome, so named because it's frequently impossible to sort out how much is  CR/sinusitis with freq throat clearing (which can be related to primary GERD)   vs  causing  secondary (" extra esophageal")  GERD from wide swings in gastric pressure that occur with throat clearing, often  promoting self use of mint and menthol lozenges that reduce the lower esophageal sphincter tone and exacerbate the problem further in a cyclical fashion.   These are the same pts (now being labeled as having "irritable larynx syndrome" by some cough centers) who not infrequently have a history of having failed to tolerate ace inhibitors,  dry powder inhalers or biphosphonates or report having atypical reflux symptoms that don't respond to standard doses of PPI , and are easily confused as having aecopd or asthma flares by even experienced allergists/ pulmonologists.   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

## 2013-12-03 ENCOUNTER — Encounter: Payer: Self-pay | Admitting: Internal Medicine

## 2013-12-03 ENCOUNTER — Ambulatory Visit (INDEPENDENT_AMBULATORY_CARE_PROVIDER_SITE_OTHER)
Admission: RE | Admit: 2013-12-03 | Discharge: 2013-12-03 | Disposition: A | Discharge status: Home / Self Care | Payer: BC Managed Care – PPO | Source: Ambulatory Visit | Attending: Internal Medicine | Admitting: Internal Medicine

## 2013-12-03 ENCOUNTER — Ambulatory Visit (INDEPENDENT_AMBULATORY_CARE_PROVIDER_SITE_OTHER): Payer: BC Managed Care – PPO | Admitting: Internal Medicine

## 2013-12-03 VITALS — BP 138/90 | HR 105 | Ht 67.5 in | Wt 208.6 lb

## 2013-12-03 DIAGNOSIS — R05 Cough: Secondary | ICD-10-CM

## 2013-12-03 DIAGNOSIS — R058 Other specified cough: Secondary | ICD-10-CM

## 2013-12-03 DIAGNOSIS — R059 Cough, unspecified: Secondary | ICD-10-CM

## 2013-12-03 DIAGNOSIS — M329 Systemic lupus erythematosus, unspecified: Secondary | ICD-10-CM

## 2013-12-03 MED ORDER — LEVOFLOXACIN 750 MG PO TABS: 750.0000 mg | ORAL_TABLET | Freq: Every day | ORAL | Status: DC

## 2013-12-03 MED ORDER — OXYCODONE HCL 5 MG PO TABS: 5.0000 mg | ORAL_TABLET | ORAL | Status: DC | PRN

## 2013-12-03 NOTE — Assessment & Plan Note (Signed)
-   sinus CT 10/15/2013 > No evidence of inflammatory sinus disease - 11/11/2013 added neurontin 100 tid for ? Irritable larynx > did not take correctly >  rec rechallenge and reduce the narcotics to prn once cough controlled.

## 2013-12-03 NOTE — Progress Notes (Signed)
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 Pulmonary Consult    Referred per Dr. [name]. Pt c/o cough x 2 months. Cough is mainly dry, but occ produces minimal green to yellow sputum. Cough bothers her all day, but esp worse at night. Cough is triggered by talking, laughing or moving around too much.   green sputum in ams, a little nasal /ear congestion bilaterally  Cough is worse at bedtime On bcps and ppi and prednisone variable dose x one year for lupus rec  First take delsym two tsp every 12 hours and supplement if needed with  tramadol 50 mg up to 2 every 4 hours to suppress the urge to cough at all or even clear your throat.  . Once you have eliminated the cough for 3 straight days try reducing the tramadol first,  then the delsym as tolerated.   Pantoprazole (protonix) 40 mg   Take 30-60 min before first meal of the day and Pepcid 20 mg one bedtime until return to office - this is the best way to tell whether stomach acid is contributing to your problem.   Stop fosfamax completely for now  Use the flutter valve whenever coughing GERD   Please see patient coordinator before you leave today  to schedule sinus CT and we will call results > neg Levaquin 750 mg x 5 days only    10/28/2013 f/u ov/[name] re: refractory cough   Chief Complaint  Patient presents with  . Follow-up    Pt states that her cough is much improved. She did not cough during the day at all yesterday. Still has some cough at night. Pulled muscle in her rt side  due to cough.  started hydrocodone 5 mg started 10/24/13 by Dr Dema Severin for cp and cough much better since then. Not sob unless coughing/ Not limited by breathing from desired activities   The key to effective treatment for your cough is eliminating the non-stop cycle of cough you're stuck in long enough to let your airway heal completely and then see if there is anything still making you cough once you stop the cough suppression, but this should take no more than 5 days to figure out First take delsym two tsp every 12 hours and supplement if needed with  Hydrocodone 5 up to every 4 h to suppress the urge to cough at all or even clear your throat.   Once you have eliminated the cough for 3 straight days try reducing the hydrocodone 5mg    then the delsym as tolerated.   Pantoprazole (protonix) 40 mg   Take 30-60 min before first meal of the day and Pepcid 20 mg one bedtime and chlortrimeton 4 mg two around 8 pm Stop fosfamax completely for now  Use the flutter valve whenever coughing GERD diet    11/11/2013 f/u ov/[name] re: chronic cough on 10 mg prednisone  Chief Complaint  Patient presents with  . Follow-up    Pt states having  increased cough for the past 2 days- non prod. No new co's today.   never got to point where didn't need hydrocodone to control throat clearing  Cough is worse as day goes on but never noct R cp no longer constant but does hurt to cough  rec Add neurontin 100 mg three times daily as a new maintenance (automatic) medication  You need 5 straight days of no coughing at all or even clearing throat - use up to oxycodone 5 mg 2 every 4 hours x 5 straight days and candy to control urge to clear the throat.     12/03/2013 f/u ov/[name] re: chronic cough x 3 m never completely resolved   Chief Complaint  Patient presents with  . Acute Visit    C/o worsening cough with very little yellow and green mucous production x 1 week. C/o right rib pain when coughing. Denies SOB.  took  neurontin x 3 days and too sleepy while on it plus hydrocodone  but then got better enough to where stopped [facility] /delsym/h1 and still used some pepcid at hs then cough flared x one week >  Reduced prednisone before the cough flared by sev days to a week but not exactly sure Same area hurts every time coughs x months = R lateral lower chest       Kouffman Reflux v Neurogenic Cough Differentiator Reflux Comments  Do you awaken from a sound sleep coughing violently?                            With trouble breathing? sometimes   Do you have choking episodes when you cannot  Get enough air, gasping for air ?              no   Do you usually cough when you lie down into  The bed, or when you just lie down to rest ?                          sometimes   Do you usually cough after meals or eating?         some   Do you cough when (or after) you bend over?    sometimes   GERD SCORE     Kouffman Reflux v Neurogenic Cough Differentiator Neurogenic   Do you more-or-less cough all day long? yes   Does change of temperature make you cough? no   Does laughing or chuckling cause you to cough? yes   Do fumes (perfume, automobile fumes, burned  Toast, etc.,) cause you to cough ?      cologne   Does speaking, singing, or talking on the phone cause you to cough   ?               sometimes   Neurogenic/Airway score        No obvious other day to day or daytime variabilty or assoc sob  chest tightness, subjective wheeze overt sinus or hb symptoms. No unusual exp hx or h/o childhood pna/ asthma or knowledge of premature birth.  Sleeping ok without nocturnal  or early am exacerbation  of respiratory  c/o's or need for noct saba. Also denies any obvious fluctuation of symptoms with weather or environmental changes or other aggravating or alleviating factors except as outlined above   Current Medications, Allergies, Complete Past Medical History, Past Surgical History, Family History, and Social  History were  reviewed in [facility] record.  ROS  The following are not active complaints unless bolded sore throat, dysphagia, dental problems, itching, sneezing,  nasal congestion or excess/ purulent secretions, ear ache,   fever, chills, sweats, unintended wt loss,  exertional cp, hemoptysis,  orthopnea pnd or leg swelling, presyncope, palpitations, heartburn, abdominal pain, anorexia, nausea, vomiting, diarrhea  or change in bowel or urinary habits, change in stools or urine, dysuria,hematuria,  rash, arthralgias, visual complaints, headache, numbness weakness or ataxia or problems with walking or coordination,  change in mood/affect or memory.                          Objective:   Physical Exam   amb bf nad with classic voice fatigue    10/28/2013        200 >  209 12/03/2013  Wt Readings from Last 3 Encounters:  10/14/13 204 lb (92.534 kg)  07/07/13 192 lb (87.091 kg)  05/28/13 190 lb 9.6 oz (86.456 kg)      HEENT: nl dentition, turbinates, and orophanx. Nl external ear canals without cough reflex   NECK :  without JVD/Nodes/TM/ nl carotid upstrokes bilaterally   LUNGS: no acc muscle use cough on insp / exp but lungs clear bilaterally   CV:  RRR  no s3 or murmur or increase in P2, no edema   ABD:  soft and nontender with nl excursion in the supine position. No bruits or organomegaly, bowel sounds nl  MS:  warm without deformities, calf tenderness, cyanosis or clubbing  SKIN: warm and dry without lesions    NEURO:  alert, approp, no deficits    CXR  10/28/2013 : There is mild enlargement of the cardiac silhouette without pulmonary vascular congestion. There is no evidence of pneumonia.   CXR  12/03/2013 :  Stable mild cardiac enlargement. Focal abnormalities right ribcage  possibly due to fracture with underlying pleural thickening     Assessment & Plan:

## 2013-12-03 NOTE — Patient Instructions (Signed)
Add neurontin 100 mg three times daily as a new maintenance (automatic) medication   You need 5 straight days of no coughing at all or even clearing throat - use up to oxycodone 5 mg 2 every 4 hours x 5 straight days and candy to control urge to clear the throat.   Protonix 40 mg Take 30-60 min before first meal of the day and Pepcid 20 mg one at bedtime   Prednisone should 20 mg until better, then 10 mg per day x 5 days then try 10 alternating with 5 mg daily   Levaquin 750 one daily x 5 days   Please remember to go to the x-ray department downstairs for your tests - we will call you with the results when they are available.  GERD (REFLUX)  is an extremely common cause of respiratory symptoms, many times with no significant heartburn at all.    It can be treated with medication, but also with lifestyle changes including avoidance of late meals, excessive alcohol, smoking cessation, and avoid fatty foods, chocolate, peppermint, colas, red wine, and acidic juices such as orange juice.  NO MINT OR MENTHOL PRODUCTS SO NO COUGH DROPS  USE SUGARLESS CANDY INSTEAD (jolley ranchers or Stover's)  NO OIL BASED VITAMINS - use powdered substitutes instead

## 2013-12-03 NOTE — Assessment & Plan Note (Addendum)
Not clear the cough has anything to do with lupus but it is noteworthy the present flare started w/in a week of reducing the dose of prednisone and she does have some "pleuritic" recurrent R cp with flare suggestive of lupus pleuritis  Try boost the prednisone back to 20 mg daily until better then taper to previous floor of 10/5

## 2013-12-03 NOTE — Progress Notes (Signed)
Quick Note:  Spoke with pt and notified of results per Dr. Wert. Pt verbalized understanding and denied any questions.  ______ 

## 2013-12-09 ENCOUNTER — Ambulatory Visit: Payer: BC Managed Care – PPO | Admitting: Internal Medicine

## 2013-12-17 ENCOUNTER — Encounter: Payer: Self-pay | Admitting: Internal Medicine

## 2013-12-17 ENCOUNTER — Ambulatory Visit (INDEPENDENT_AMBULATORY_CARE_PROVIDER_SITE_OTHER): Payer: BC Managed Care – PPO | Admitting: Internal Medicine

## 2013-12-17 VITALS — BP 110/74 | HR 89 | Temp 98.3°F | Ht 67.5 in | Wt 209.0 lb

## 2013-12-17 DIAGNOSIS — R059 Cough, unspecified: Secondary | ICD-10-CM

## 2013-12-17 DIAGNOSIS — R05 Cough: Secondary | ICD-10-CM

## 2013-12-17 DIAGNOSIS — R058 Other specified cough: Secondary | ICD-10-CM

## 2013-12-17 MED ORDER — PANTOPRAZOLE SODIUM 40 MG PO TBEC: DELAYED_RELEASE_TABLET | ORAL | Status: DC

## 2013-12-17 NOTE — Patient Instructions (Addendum)
GERD (REFLUX)  is an extremely common cause of respiratory symptoms, many times with no significant heartburn at all.    It can be treated with medication, but also with lifestyle changes including avoidance of late meals, excessive alcohol, smoking cessation, and avoid fatty foods, chocolate, peppermint, colas, red wine, and acidic juices such as orange juice.  NO MINT OR MENTHOL PRODUCTS SO NO COUGH DROPS  USE SUGARLESS CANDY INSTEAD (jolley ranchers or Stover's)  NO OIL BASED VITAMINS - use powdered substitutes.  Continue  Pantoprazole (protonix) 40 mg   Take 30-60 min before first meal of the day and Pepcid 20 mg one bedtime until return to office - this is the best way to tell whether stomach acid is contributing to your problem.    Continue neurontin but may need to increase it to 200 mg three times a day if you can tolerate it.   Please schedule a follow up office visit in 6 weeks, call sooner if needed

## 2013-12-17 NOTE — Progress Notes (Signed)
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 Pulmonary Consult    Referred per Allison. Harlan Porter. Pt c/o cough x 2 months. Cough is mainly dry, but occ produces minimal green to yellow sputum. Cough bothers her all day, but esp worse at night. Cough is triggered by talking, laughing or moving around too much.   green sputum in ams, a little nasal /ear congestion bilaterally  Cough is worse at bedtime On bcps and ppi and prednisone variable dose x one year for lupus rec  First take delsym two tsp every 12 hours and supplement if needed with  tramadol 50 mg up to 2 every 4 hours    Pantoprazole (protonix) 40 mg   Take 30-60 min before first meal of the day and Pepcid 20 mg one bedtime until return to office - this is the best way to tell whether stomach acid is contributing to your problem.   Stop fosfamax completely for now  Use the flutter valve whenever coughing GERD     schedule sinus CT and we will call results > neg Levaquin 750 mg x 5 days only    10/28/2013 f/u ov/[name] re: refractory cough   Chief Complaint  Patient presents with  . Follow-up    Pt states that her cough is much improved. She did not cough during the day at all yesterday. Still has some cough at night. Pulled muscle in her rt side due to cough.  started hydrocodone 5 mg started 10/24/13 by [name] for cp and cough much better since then. Not sob unless coughing/ Not limited by breathing from desired activities   The key to effective treatment for your cough is  eliminating the non-stop cycle of cough you're stuck in long enough to let your airway heal completely and then see if there is anything still making you cough once you stop the cough suppression, but this should take no more than 5 days to figure out First take delsym two tsp every 12 hours and supplement if needed with  Hydrocodone 5 up to every 4 h to suppress the urge to cough at all or even clear your throat.   Once you have eliminated the cough for 3 straight days try reducing the hydrocodone 5mg    then the delsym as tolerated.   Pantoprazole (protonix) 40 mg   Take 30-60 min before first meal of the day and Pepcid 20 mg one bedtime and chlortrimeton 4 mg two around 8 pm Stop fosfamax completely for now  Use the flutter valve whenever coughing GERD diet    11/11/2013 f/u ov/[name] re: chronic cough on 10 mg prednisone  Chief Complaint  Patient presents with  . Follow-up    Pt states having increased cough for the past 2 days- non prod. No new co's today.   never got to point where didn't need hydrocodone to control throat clearing  Cough is worse as day goes on but never noct R cp no longer constant  but does hurt to cough  rec Add neurontin 100 mg three times daily as a new maintenance (automatic) medication  You need 5 straight days of no coughing at all or even clearing throat - use up to oxycodone 5 mg 2 every 4 hours x 5 straight days and candy to control urge to clear the throat.     12/03/2013 f/u ov/[name] re: chronic cough x 3 m never completely resolved   Chief Complaint  Patient presents with  . Acute Visit    C/o worsening cough with very little yellow and green mucous production x 1 week. C/o right rib pain when coughing. Denies SOB.  took neurontin x 3 days and too sleepy while on it plus hydrocodone  but then got better enough to where stopped [name] /delsym/h1 and still used some pepcid at hs then cough flared x one week >  Reduced prednisone before the cough flared by sev  days to a week but not exactly sure Same area hurts every time coughs x months = R lateral lower chest       [name] Reflux v Neurogenic Cough Differentiator Reflux Comments  Do you awaken from a sound sleep coughing violently?                            With trouble breathing? sometimes   Do you have choking episodes when you cannot  Get enough air, gasping for air ?              no   Do you usually cough when you lie down into  The bed, or when you just lie down to rest ?                          sometimes   Do you usually cough after meals or eating?         some   Do you cough when (or after) you bend over?    sometimes   GERD SCORE     [name] Reflux v Neurogenic Cough Differentiator Neurogenic   Do you more-or-less cough all day long? yes   Does change of temperature make you cough? no   Does laughing or chuckling cause you to cough? yes   Do fumes (perfume, automobile fumes, burned  Toast, etc.,) cause you to cough ?      cologne   Does speaking, singing, or talking on the phone cause you to cough   ?               sometimes   Neurogenic/Airway score        12/17/2013 f/u ov/[name] re: pred 10 vs 5 per [name] and now  No cough meds at all on neurontin but not using consistently  Chief Complaint  Patient presents with  . Follow-up    Pt states that her cough is much improved, but has not resolved. No new co's today.   no change 10 vs 5  Not limited by breathing from desired activities    No obvious other day to day or daytime variabilty or assoc  chest tightness, subjective wheeze overt sinus or hb symptoms. No unusual exp hx or h/o childhood pna/ asthma or knowledge of premature birth.  Sleeping ok without nocturnal  or early am exacerbation  of respiratory  c/o's or need for noct saba. Also denies any obvious fluctuation of symptoms with weather or environmental  changes or other aggravating or alleviating factors except as outlined above   Current Medications,  Allergies, Complete Past Medical History, Past Surgical History, Family History, and Social History were reviewed in [employer] record.  ROS  The following are not active complaints unless bolded sore throat, dysphagia, dental problems, itching, sneezing,  nasal congestion or excess/ purulent secretions, ear ache,   fever, chills, sweats, unintended wt loss,  exertional cp, hemoptysis,  orthopnea pnd or leg swelling, presyncope, palpitations, heartburn, abdominal pain, anorexia, nausea, vomiting, diarrhea  or change in bowel or urinary habits, change in stools or urine, dysuria,hematuria,  rash, arthralgias, visual complaints, headache, numbness weakness or ataxia or problems with walking or coordination,  change in mood/affect or memory.                          Objective:   Physical Exam   amb bf nad with less voice fatigue    10/28/2013        200 >  209 12/03/2013 > 12/17/2013  209  Wt Readings from Last 3 Encounters:  10/14/13 204 lb (92.534 kg)  07/07/13 192 lb (87.091 kg)  05/28/13 190 lb 9.6 oz (86.456 kg)      HEENT: nl dentition, turbinates, and orophanx. Nl external ear canals without cough reflex   NECK :  without JVD/Nodes/TM/ nl carotid upstrokes bilaterally   LUNGS: no acc muscle use cough on insp / exp but lungs clear bilaterally   CV:  RRR  no s3 or murmur or increase in P2, no edema   ABD:  soft and nontender with nl excursion in the supine position. No bruits or organomegaly, bowel sounds nl  MS:  warm without deformities, calf tenderness, cyanosis or clubbing  SKIN: warm and dry without lesions    NEURO:  alert, approp, no deficits    CXR  10/28/2013 : There is mild enlargement of the cardiac silhouette without pulmonary vascular congestion. There is no evidence of pneumonia.   CXR  12/03/2013 :  Stable mild cardiac enlargement. Focal abnormalities right ribcage  possibly due to fracture with underlying pleural  thickening     Assessment & Plan:

## 2013-12-19 NOTE — Assessment & Plan Note (Signed)
-   sinus CT 10/15/2013 > No evidence of inflammatory sinus disease - 11/11/2013 added neurontin 100 tid for ? Irritable larynx > did not take correctly > rec rechallenge with 100 tid and increase to 200 tid if not completely better  Reviewed with pt: The standardized cough guidelines published in Chest by Lissa Morales in 2006 are still the best available and consist of a multiple step process (up to 12!) , not a single office visit,  and are intended  to address this problem logically,  with an alogrithm dependent on response to empiric treatment at  each progressive step  to determine a specific diagnosis with  minimal addtional testing needed. Therefore if adherence is an issue or can't be accurately verified,  it's very unlikely the standard evaluation and treatment will be successful here.    Furthermore, response to therapy (other than acute cough suppression, which should only be used short term with avoidance of narcotic containing cough syrups if possible), can be a gradual process for which the patient may perceive immediate benefit.   will re-eval in 6 weeks

## 2014-01-29 ENCOUNTER — Ambulatory Visit: Payer: BC Managed Care – PPO | Admitting: Internal Medicine

## 2014-03-29 ENCOUNTER — Other Ambulatory Visit (HOSPITAL_COMMUNITY): Payer: Self-pay | Admitting: Obstetrics and Gynecology

## 2014-03-29 DIAGNOSIS — Z1231 Encounter for screening mammogram for malignant neoplasm of breast: Secondary | ICD-10-CM

## 2014-04-06 ENCOUNTER — Ambulatory Visit (HOSPITAL_COMMUNITY): Payer: BC Managed Care – PPO

## 2014-04-13 ENCOUNTER — Ambulatory Visit (HOSPITAL_COMMUNITY)
Admission: RE | Admit: 2014-04-13 | Discharge: 2014-04-13 | Disposition: A | Discharge status: Home / Self Care | Payer: BC Managed Care – PPO | Source: Ambulatory Visit | Attending: Obstetrics and Gynecology | Admitting: Obstetrics and Gynecology

## 2014-04-13 DIAGNOSIS — Z1231 Encounter for screening mammogram for malignant neoplasm of breast: Secondary | ICD-10-CM | POA: Diagnosis not present

## 2014-05-17 ENCOUNTER — Encounter: Payer: Self-pay | Admitting: Internal Medicine

## 2014-06-14 ENCOUNTER — Ambulatory Visit
Admission: RE | Admit: 2014-06-14 | Discharge: 2014-06-14 | Disposition: A | Discharge status: Home / Self Care | Payer: BC Managed Care – PPO | Source: Ambulatory Visit | Attending: Family Medicine | Admitting: Family Medicine

## 2014-06-14 ENCOUNTER — Other Ambulatory Visit: Payer: Self-pay | Admitting: Family Medicine

## 2014-06-14 DIAGNOSIS — M79605 Pain in left leg: Secondary | ICD-10-CM

## 2014-06-14 DIAGNOSIS — R609 Edema, unspecified: Secondary | ICD-10-CM

## 2014-07-06 ENCOUNTER — Other Ambulatory Visit: Payer: Self-pay | Admitting: Family Medicine

## 2014-07-06 ENCOUNTER — Ambulatory Visit
Admission: RE | Admit: 2014-07-06 | Discharge: 2014-07-06 | Disposition: A | Discharge status: Home / Self Care | Payer: BC Managed Care – PPO | Source: Ambulatory Visit | Attending: Family Medicine | Admitting: Family Medicine

## 2014-07-06 DIAGNOSIS — S93401A Sprain of unspecified ligament of right ankle, initial encounter: Secondary | ICD-10-CM

## 2014-08-26 ENCOUNTER — Ambulatory Visit
Admission: RE | Admit: 2014-08-26 | Discharge: 2014-08-26 | Disposition: A | Discharge status: Home / Self Care | Payer: BC Managed Care – PPO | Source: Ambulatory Visit | Attending: Family Medicine | Admitting: Family Medicine

## 2014-08-26 ENCOUNTER — Other Ambulatory Visit: Payer: Self-pay | Admitting: Family Medicine

## 2014-08-26 DIAGNOSIS — R609 Edema, unspecified: Secondary | ICD-10-CM

## 2014-08-26 DIAGNOSIS — M79605 Pain in left leg: Secondary | ICD-10-CM

## 2014-08-31 ENCOUNTER — Other Ambulatory Visit: Payer: Self-pay | Admitting: Family Medicine

## 2014-08-31 DIAGNOSIS — M79605 Pain in left leg: Secondary | ICD-10-CM

## 2014-09-03 ENCOUNTER — Other Ambulatory Visit: Payer: Self-pay | Admitting: Family Medicine

## 2014-09-03 DIAGNOSIS — M25512 Pain in left shoulder: Secondary | ICD-10-CM

## 2014-09-08 ENCOUNTER — Other Ambulatory Visit: Payer: Self-pay | Admitting: Physician Assistant

## 2014-09-08 DIAGNOSIS — M79605 Pain in left leg: Secondary | ICD-10-CM

## 2014-09-08 DIAGNOSIS — M25512 Pain in left shoulder: Secondary | ICD-10-CM

## 2014-09-10 ENCOUNTER — Ambulatory Visit
Admission: RE | Admit: 2014-09-10 | Discharge: 2014-09-10 | Disposition: A | Discharge status: Home / Self Care | Payer: BC Managed Care – PPO | Source: Ambulatory Visit | Attending: Family Medicine | Admitting: Family Medicine

## 2014-09-10 DIAGNOSIS — M25512 Pain in left shoulder: Secondary | ICD-10-CM

## 2014-09-10 DIAGNOSIS — M79605 Pain in left leg: Secondary | ICD-10-CM

## 2014-09-10 MED ORDER — GADOBENATE DIMEGLUMINE 529 MG/ML IV SOLN
20.0000 mL | Freq: Once | INTRAVENOUS | Status: AC | PRN
  Administered 2014-09-10: 20 mL via INTRAVENOUS

## 2014-10-27 ENCOUNTER — Other Ambulatory Visit: Payer: Self-pay | Admitting: Orthopedic Surgery

## 2014-10-27 DIAGNOSIS — M8700 Idiopathic aseptic necrosis of unspecified bone: Secondary | ICD-10-CM

## 2014-11-06 ENCOUNTER — Ambulatory Visit
Admission: RE | Admit: 2014-11-06 | Discharge: 2014-11-06 | Disposition: A | Discharge status: Home / Self Care | Payer: BC Managed Care – PPO | Source: Ambulatory Visit | Attending: Orthopedic Surgery | Admitting: Orthopedic Surgery

## 2014-11-06 DIAGNOSIS — M8700 Idiopathic aseptic necrosis of unspecified bone: Secondary | ICD-10-CM

## 2015-03-22 ENCOUNTER — Other Ambulatory Visit: Payer: Self-pay

## 2015-03-22 DIAGNOSIS — Z803 Family history of malignant neoplasm of breast: Secondary | ICD-10-CM

## 2015-03-22 DIAGNOSIS — Z1231 Encounter for screening mammogram for malignant neoplasm of breast: Secondary | ICD-10-CM

## 2015-04-18 ENCOUNTER — Ambulatory Visit: Payer: BC Managed Care – PPO

## 2015-05-09 ENCOUNTER — Ambulatory Visit: Payer: BC Managed Care – PPO

## 2015-05-24 ENCOUNTER — Ambulatory Visit: Payer: BC Managed Care – PPO

## 2015-06-08 ENCOUNTER — Ambulatory Visit: Payer: BC Managed Care – PPO

## 2016-07-16 HISTORY — PX: BUNIONECTOMY: SHX129

## 2016-09-11 DIAGNOSIS — E669 Obesity, unspecified: Secondary | ICD-10-CM | POA: Insufficient documentation

## 2016-11-15 ENCOUNTER — Other Ambulatory Visit (HOSPITAL_BASED_OUTPATIENT_CLINIC_OR_DEPARTMENT_OTHER): Payer: Self-pay | Admitting: Family Medicine

## 2016-11-15 ENCOUNTER — Ambulatory Visit (HOSPITAL_BASED_OUTPATIENT_CLINIC_OR_DEPARTMENT_OTHER)
Admission: RE | Admit: 2016-11-15 | Discharge: 2016-11-15 | Disposition: A | Discharge status: Home / Self Care | Payer: BC Managed Care – PPO | Source: Ambulatory Visit | Attending: Family Medicine | Admitting: Family Medicine

## 2016-11-15 DIAGNOSIS — R7989 Other specified abnormal findings of blood chemistry: Secondary | ICD-10-CM

## 2016-11-15 DIAGNOSIS — M79604 Pain in right leg: Secondary | ICD-10-CM

## 2016-11-15 DIAGNOSIS — M79605 Pain in left leg: Secondary | ICD-10-CM | POA: Insufficient documentation

## 2017-04-21 ENCOUNTER — Emergency Department (HOSPITAL_BASED_OUTPATIENT_CLINIC_OR_DEPARTMENT_OTHER): Payer: BC Managed Care – PPO

## 2017-04-21 ENCOUNTER — Encounter (HOSPITAL_BASED_OUTPATIENT_CLINIC_OR_DEPARTMENT_OTHER): Payer: Self-pay | Admitting: Emergency Medicine

## 2017-04-21 ENCOUNTER — Emergency Department (HOSPITAL_BASED_OUTPATIENT_CLINIC_OR_DEPARTMENT_OTHER)
Admission: EM | Admit: 2017-04-21 | Discharge: 2017-04-21 | Disposition: A | Discharge status: Home / Self Care | Payer: BC Managed Care – PPO | Attending: Emergency Medicine | Admitting: Emergency Medicine

## 2017-04-21 DIAGNOSIS — Y998 Other external cause status: Secondary | ICD-10-CM | POA: Insufficient documentation

## 2017-04-21 DIAGNOSIS — Z79899 Other long term (current) drug therapy: Secondary | ICD-10-CM | POA: Diagnosis not present

## 2017-04-21 DIAGNOSIS — W01198A Fall on same level from slipping, tripping and stumbling with subsequent striking against other object, initial encounter: Secondary | ICD-10-CM | POA: Diagnosis not present

## 2017-04-21 DIAGNOSIS — Y9222 Religious institution as the place of occurrence of the external cause: Secondary | ICD-10-CM | POA: Diagnosis not present

## 2017-04-21 DIAGNOSIS — W19XXXA Unspecified fall, initial encounter: Secondary | ICD-10-CM

## 2017-04-21 DIAGNOSIS — I10 Essential (primary) hypertension: Secondary | ICD-10-CM | POA: Diagnosis not present

## 2017-04-21 DIAGNOSIS — S299XXA Unspecified injury of thorax, initial encounter: Secondary | ICD-10-CM | POA: Diagnosis present

## 2017-04-21 DIAGNOSIS — S20212A Contusion of left front wall of thorax, initial encounter: Secondary | ICD-10-CM | POA: Insufficient documentation

## 2017-04-21 DIAGNOSIS — M25512 Pain in left shoulder: Secondary | ICD-10-CM | POA: Insufficient documentation

## 2017-04-21 DIAGNOSIS — Y9301 Activity, walking, marching and hiking: Secondary | ICD-10-CM | POA: Insufficient documentation

## 2017-04-21 MED ORDER — LIDOCAINE HCL 2 % EX GEL: 1.0000 "application " | CUTANEOUS | 0 refills | Status: DC | PRN

## 2017-04-21 MED ORDER — ACETAMINOPHEN 325 MG PO TABS
650.0000 mg | ORAL_TABLET | Freq: Once | ORAL | Status: AC
  Administered 2017-04-21: 650 mg via ORAL
  Filled 2017-04-21: qty 2

## 2017-04-21 NOTE — Discharge Instructions (Signed)
The x-rays of her shoulder and ribs did not show any fractures/broken bones today.  650 mg of Tylenol may be taken once every 6 hours as needed for pain control. You can also apply topical lidocaine up to 4 times per day to any areas that are sore. Please not use this medication more than 4 times per day.  You can use a pillow under your left arm and left side to help with pain when he was taking deep breaths. Please use the incentive spirometer as instructed.  If your symptoms do not start to improve within the next week, please follow-up with your orthopedic surgeon at [facility] for reevaluation.  If he develop new or worsening symptoms, including numbness, weakness, or decreased pulses in the left arm, or shortness of breath or difficulty breathing, please return to the emergency department for evaluation.

## 2017-04-21 NOTE — ED Triage Notes (Addendum)
Pt fell today while coming out of church. Denies LOC. C/o L shoulder and upper back/rib  pain.

## 2017-04-21 NOTE — ED Provider Notes (Signed)
[facility] Provider Note   CSN: [id number] Arrival date & time: 04/21/17  1151     History   Chief Complaint Chief Complaint  Patient presents with  . Fall    HPI [name] is a 46 y.o. 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 The patient reports that she was walking out of church when she lost her footing and fell, hitting her left shoulder and left ribs. She denies hitting her head, LOC, nausea, or emesis. No numbness or weakness. She denies dyspnea, chest pain, dizziness, or lightheadedness. She denies neck pain or low back pain. No treatment prior to arrival.   The history is provided by the patient. No language interpreter was used.    Past Medical History:  Diagnosis Date  . Hypertension   . Lupus   . Prediabetes 10/2011  . vascular necrosis    bilat hips    Patient Active Problem List   Diagnosis Date Noted  . Upper airway cough syndrome 10/14/2013  . Lupus 10/11/2008    Class: History of    Past Surgical History:  Procedure Laterality Date  . bilat hip grafts     2007  . UMBILICAL HERNIA REPAIR      OB History    Gravida Para Term Preterm AB Living   1 1 1     1    SAB TAB Ectopic Multiple Live Births                   Home Medications    Prior to Admission medications   Medication Sig Start Date End Date Taking? Authorizing Provider  aspirin 325 MG EC tablet Take 325 mg by mouth daily. X 1 month for superficial leg clots   Yes [provider]  furosemide (LASIX) 20 MG tablet Take 1 tablet by mouth daily as needed for fluid.  06/16/13  Yes [provider]  hydroxychloroquine (PLAQUENIL) 200 MG tablet Take by mouth 2 (two) times daily.   Yes [provider]  losartan (COZAAR) 50 MG tablet Take 50 mg by mouth daily.   Yes [provider]  chlorpheniramine  (CHLOR-TRIMETON) 4 MG tablet Take 4 mg by mouth at bedtime.    [provider]  Cholecalciferol (VITAMIN D-3) 5000 UNITS TABS Take 1 tablet by mouth daily.    [provider]  cyclophosphamide (CYTOXAN) 50 MG tablet Take 50 mg by mouth daily.  06/16/13   [provider]  dextromethorphan (DELSYM) 30 MG/5ML liquid 2 tsp every 12 hours as needed    [provider]  famotidine (PEPCID) 20 MG tablet One at bedtime 10/14/13   Tanda Rockers, MD  gabapentin (NEURONTIN) 100 MG capsule Take 1 capsule (100 mg total) by mouth 3 (three) times daily. One three times daily 11/11/13   Tanda Rockers, MD  lidocaine (XYLOCAINE) 2 % jelly Apply 1 application topically as needed. 04/21/17   Blimy Napoleon A, PA-C  Norethindrone-Ethinyl Estradiol-Fe Biphas (LO LOESTRIN FE) 1 MG-10 MCG / 10 MCG tablet Take 1 tablet by mouth once. 12/04/11 12/17/13  Donnel Saxon, CNM  oxyCODONE (OXY IR/ROXICODONE) 5 MG immediate release tablet Take 1 tablet (5 mg total) by mouth every 4 (four) hours as needed for moderate pain (ok to take up to 2 every hours). 12/03/13   Tanda Rockers, MD  pantoprazole (PROTONIX) 40 MG tablet  Take 30-60 min before first meal of the day 12/17/13   Tanda Rockers, MD  predniSONE (DELTASONE) 10 MG tablet Alternating 5mg  daily and 10 mg daily    [provider]  Respiratory Therapy Supplies (FLUTTER) DEVI Use as directed 10/14/13   Tanda Rockers, MD  valsartan (DIOVAN) 160 MG tablet Take 160 mg by mouth daily.    [provider]    Family History Family History  Problem Relation Age of Onset  . Breast cancer Mother   . Heart disease Mother   . Breast cancer Maternal Grandmother   . Heart disease Father     Social History Social History  Substance Use Topics  . Smoking status: Never Smoker  . Smokeless tobacco: Never Used  . Alcohol use No     Allergies   Lisinopril and Penicillins   Review of Systems Review of Systems  Eyes: Negative for  visual disturbance.  Respiratory: Negative for chest tightness.   Cardiovascular: Positive for chest pain (left ribs).  Gastrointestinal: Negative for abdominal pain, diarrhea, nausea and vomiting.  Musculoskeletal: Positive for arthralgias and myalgias. Negative for gait problem, joint swelling, neck pain and neck stiffness.  Neurological: Negative for weakness, light-headedness and numbness.     Physical Exam Updated Vital Signs BP 125/67   Pulse 74   Temp 98.2 F (36.8 C) (Oral)   Resp 18   Ht 5\' 7"  (1.702 m)   Wt 91.6 kg (202 lb)   LMP 04/02/2017   SpO2 100%   BMI 31.64 kg/m   Physical Exam  Constitutional: No distress.  HENT:  Head: Normocephalic.  Eyes: Conjunctivae are normal.  Neck: Neck supple.  Cardiovascular: Normal rate, regular rhythm and normal heart sounds.  Exam reveals no gallop and no friction rub.   No murmur heard. Pulmonary/Chest: Effort normal and breath sounds normal. No respiratory distress. She has no wheezes. She has no rales. She exhibits tenderness.  Tender to palpation over the left lateral and posterior ribs diffusely. No overlying ecchymosis, lacerations, abrasions, or skin tears.  Abdominal: Soft. She exhibits no distension.  Musculoskeletal: She exhibits tenderness. She exhibits no edema or deformity.  Tender to palpation over the left shoulder diffusely. Decreased active range of motion secondary to pain. Sensation is intact throughout the left upper extremity. Radial pulses are 2+ and symmetric. Independently moves all fingers. 5 out of 5 grip strength and strength against resistance of the bilateral upper extremities.   No tenderness to palpation of the spinous processes of the cervical, thoracic, or lumbar spine. No paraspinal muscle tenderness of the cervical or lumbar spine.  Neurological: She is alert.  Skin: Skin is warm. No rash noted.  Psychiatric: Her behavior is normal.  Nursing note and vitals reviewed.    ED Treatments /  Results  Labs (all labs ordered are listed, but only abnormal results are displayed) Labs Reviewed - No data to display  EKG  EKG Interpretation None       Radiology Dg Ribs Unilateral W/chest Left  Result Date: 04/21/2017 CLINICAL DATA:  S/p fall, fell on sidewalk landing on left side of body, left shoulder pain with painful ROM, hx LUPUS with left shoulder surgery in past, left posterior rib pain EXAM: LEFT RIBS AND CHEST - 3+ VIEW COMPARISON:  12/03/2013 FINDINGS: Cardiac silhouette is mildly enlarged. No mediastinal or hilar masses. No evidence of adenopathy. Lungs are clear.  No pleural effusion or pneumothorax. No rib fracture or rib lesion. Sclerosis of the superior humeral  heads bilaterally is consistent with avascular necrosis, stable. IMPRESSION: 1. No rib fracture or rib lesion. 2. No acute cardiopulmonary disease. Electronically Signed   By: Lajean Manes M.D.   On: 04/21/2017 12:41   Dg Shoulder Left  Result Date: 04/21/2017 CLINICAL DATA:  Status post fall.  Limited range of motion. EXAM: LEFT SHOULDER - 2+ VIEW COMPARISON:  None. FINDINGS: There is no fracture or dislocation. There is subchondral sclerosis in the superior left humeral head consistent with avascular necrosis. There are mild degenerative changes of the acromioclavicular joint. IMPRESSION: No acute osseous injury of the left shoulder. Electronically Signed   By: Kathreen Devoid   On: 04/21/2017 12:41    Procedures Procedures (including critical care time)  Medications Ordered in ED Medications  acetaminophen (TYLENOL) tablet 650 mg (650 mg Oral Given 04/21/17 1416)     Initial Impression / Assessment and Plan / ED Course  I have reviewed the triage vital signs and the nursing notes.  Pertinent labs & imaging results that were available during my care of the patient were reviewed by me and considered in my medical decision making (see chart for details).     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 The patient denies hitting her head, syncope, nausea, or emesis. Imaging of the left ribs and left shoulder are negative for fracture. Tylenol was given in the ED for pain, and the patient reports her left shoulder pain has improved. Incentive spirometry given for pneumonia precautions secondary to rib contusions. The patient declines a sling for her left shoulder at this time. She is unable to take NSAIDs, but recommended Tylenol and lidocaine gel for pain control. She is established with orthopedics at [facility] and was encouraged to follow up with orthopedic surgery of her symptoms do not start improving within the next week. Strict return precautions given. No acute distress. The patient is safe for discharge at this time.  Final Clinical Impressions(s) / ED Diagnoses   Final diagnoses:  Fall, initial encounter  Rib contusion, left, initial encounter  Acute pain of left shoulder    New Prescriptions New Prescriptions   LIDOCAINE (XYLOCAINE) 2 % JELLY    Apply 1 application topically as needed.     Joline Maxcy A, PA-C 04/21/17 1528    Charlesetta Shanks, MD 04/23/17 [phone]

## 2017-04-21 NOTE — ED Notes (Signed)
Pt discharged to home with family. NAD.  

## 2017-12-02 ENCOUNTER — Other Ambulatory Visit: Payer: Self-pay

## 2017-12-02 ENCOUNTER — Encounter: Payer: Self-pay | Admitting: Podiatry

## 2017-12-02 ENCOUNTER — Ambulatory Visit (INDEPENDENT_AMBULATORY_CARE_PROVIDER_SITE_OTHER): Payer: BC Managed Care – PPO

## 2017-12-02 ENCOUNTER — Ambulatory Visit (INDEPENDENT_AMBULATORY_CARE_PROVIDER_SITE_OTHER): Payer: BC Managed Care – PPO | Admitting: Podiatry

## 2017-12-02 VITALS — BP 108/63 | HR 76

## 2017-12-02 DIAGNOSIS — M21611 Bunion of right foot: Secondary | ICD-10-CM

## 2017-12-02 DIAGNOSIS — M21612 Bunion of left foot: Secondary | ICD-10-CM

## 2017-12-02 NOTE — Patient Instructions (Signed)

## 2017-12-04 NOTE — Progress Notes (Signed)
Subjective:   Patient ID: [name], female   DOB: 47 y.o.   MRN: [id number]   HPI Patient presents with significant bunion deformity of a number of years and patient states that gradually over the last years they have become more symptomatic.  Patient states that she is tried wider shoes she is tried soaks and other modalities without relief and patient does not smoke and likes to be active   Review of Systems  All other systems reviewed and are negative.       Objective:  Physical Exam  Constitutional: She appears well-developed and well-nourished.  Cardiovascular: Intact distal pulses.  Pulmonary/Chest: Effort normal.  Musculoskeletal: Normal range of motion.  Neurological: She is alert.  Skin: Skin is warm.  Nursing note and vitals reviewed.   Neurovascular status was found to be intact muscle strength adequate range of motion within normal limits.  Patient does have history of lupus and used to take a higher dose steroid and did develop avascular necrosis of her hips requiring hip replacement and states that it is under good control now.  Patient does have significant prominence around the first metatarsal head left over right with deviation of the hallux against the second toe and moderate lifting of the hallux bilateral.  Patient does have depression of the arch noted bilateral and family history of this condition and was found to have good digital perfusion and is well oriented x3     Assessment:  Significant structural bunion deformity bilateral with mild elevation of the intermetatarsal angle and symptomatic discomfort associated with this     Plan:  H&P x-rays reviewed with patient and condition discussed at great length.  At this point due to the high elevation of the intermetatarsal angle I do think this will take a more proximal procedure and most likely will require Lapidus fusion and I did discuss case with Dr. Carman Ching.  I am referring this patient to Dr.  Carman Ching for evaluation and treatment  X-rays indicate that there is elevation of a significant nature of the inner metatarsal angle bilateral with deviation of the hallux against the second toe bilateral

## 2017-12-16 ENCOUNTER — Encounter: Payer: Self-pay | Admitting: Podiatry

## 2017-12-16 ENCOUNTER — Ambulatory Visit: Payer: BC Managed Care – PPO | Admitting: Podiatry

## 2017-12-16 DIAGNOSIS — M21612 Bunion of left foot: Secondary | ICD-10-CM | POA: Diagnosis not present

## 2017-12-16 DIAGNOSIS — M21611 Bunion of right foot: Secondary | ICD-10-CM

## 2017-12-16 NOTE — Patient Instructions (Signed)

## 2017-12-17 ENCOUNTER — Telehealth: Payer: Self-pay | Admitting: *Deleted

## 2017-12-17 DIAGNOSIS — R0989 Other specified symptoms and signs involving the circulatory and respiratory systems: Secondary | ICD-10-CM

## 2017-12-17 NOTE — Telephone Encounter (Signed)
Decreased pulses

## 2017-12-17 NOTE — Telephone Encounter (Signed)
-----   Message from Trula Slade, DPM sent at 12/17/2017  8:00 AM EDT ----- [name] and [name]  I am planning  ongoing surgery in 2 weeks for her to have an arterial duplex performed prior to the surgery.  Not sure who is going to order it but didn't want to duplicate it.

## 2017-12-17 NOTE — Telephone Encounter (Signed)
Faxed orders to [facility]. 

## 2017-12-18 NOTE — Progress Notes (Signed)
Subjective: 47 year old female presents the office today for surgical consultation due to bunion deformity in the left foot.  She states this is been ongoing for several years and become more consistent and she has tried changing shoes without any significant improvement in this time she was to discuss surgical intervention. Denies any systemic complaints such as fevers, chills, nausea, vomiting. No acute changes since last appointment, and no other complaints at this time.   Objective: AAO x3, NAD DP/PT pulses palpable bilaterally, CRT less than 3 seconds-mild chronic swelling to bilateral lower extremities. 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  There is tenderness on the medial aspect the first metatarsal head on the bunion site and there is mild erythema from irritation inside of her shoes.  There is no pain or crepitation restriction with MPJ range of motion.  First ray hypomobility is present.  There is no other areas of tenderness identified at this time. No open lesions or pre-ulcerative lesions.  No pain with calf compression, swelling, warmth, erythema  Assessment: Left foot HAV  Plan: -All treatment options discussed with the patient including all alternatives, risks, complications.  -X-rays and chart reviewed.  We discussed with conservative as well as surgical treatment options.  She wished to proceed with surgery.  We discussed a Lapidus bunionectomy with hardware fixation.  We discussed the surgery as well as the postoperative course and she wants to going to proceed with surgery. -The incision placement as well as the postoperative course was discussed with the patient. I discussed risks of the surgery which include, but not limited to, infection, bleeding, pain, swelling, need for further surgery, delayed or nonhealing, painful or ugly scar, numbness or sensation changes, over/under correction, recurrence, transfer lesions, further deformity, hardware failure, DVT/PE, loss of  toe/foot. Patient understands these risks and wishes to proceed with surgery. The surgical consent was reviewed with the patient all 3 pages were signed. No promises or guarantees were given to the outcome of the procedure. All questions were answered to the best of my ability. Before the surgery the patient was encouraged to call the office if there is any further questions. The surgery will be performed at the [facility] on an outpatient basis. -We will order arterial studies prior to surgery. -Patient encouraged to call the office with any questions, concerns, change in symptoms.   Trula Slade DPM

## 2017-12-19 ENCOUNTER — Telehealth: Payer: Self-pay | Admitting: Podiatry

## 2017-12-23 ENCOUNTER — Ambulatory Visit: Payer: BC Managed Care – PPO | Admitting: Podiatry

## 2017-12-24 ENCOUNTER — Ambulatory Visit (HOSPITAL_COMMUNITY)
Admission: RE | Admit: 2017-12-24 | Discharge: 2017-12-24 | Disposition: A | Discharge status: Home / Self Care | Payer: BC Managed Care – PPO | Source: Ambulatory Visit | Attending: Cardiovascular Disease | Admitting: Cardiovascular Disease

## 2017-12-24 DIAGNOSIS — R0989 Other specified symptoms and signs involving the circulatory and respiratory systems: Secondary | ICD-10-CM

## 2017-12-26 ENCOUNTER — Telehealth: Payer: Self-pay | Admitting: *Deleted

## 2017-12-26 NOTE — Telephone Encounter (Signed)
-----   Message from Trula Slade, DPM sent at 12/25/2017  1:40 PM EDT ----- Tivis Ringer- please let her know that on the left side her circulation is normal and should be fine for the surgery on the left foot. The circulation is normal on the right side but it is a little decreased in the toes on the RIGHT side. Will watch the right side but seems to have adequate at this time and no intervention needed.

## 2017-12-26 NOTE — Telephone Encounter (Signed)
Left message on pt's mobile phone to call for results. Unable to leave a message on home phone rang for over 1 minute without answering service.

## 2017-12-27 ENCOUNTER — Encounter: Payer: Self-pay | Admitting: *Deleted

## 2017-12-27 ENCOUNTER — Telehealth: Payer: Self-pay

## 2017-12-27 ENCOUNTER — Telehealth: Payer: Self-pay | Admitting: Podiatry

## 2017-12-27 NOTE — Telephone Encounter (Signed)
Spoke with patient informing her of Dr Leigh Aurora orders/ result notes. She is to call back with any other questions or concerns.

## 2017-12-27 NOTE — Telephone Encounter (Signed)
Pt was calling back about her results. She received a message last night. Please give her a call back.

## 2017-12-27 NOTE — Telephone Encounter (Signed)
LVM for patient ot call to discuss results

## 2017-12-31 ENCOUNTER — Telehealth: Payer: Self-pay | Admitting: Podiatry

## 2017-12-31 NOTE — Telephone Encounter (Signed)
I informed pt she could pick up the 6 month handicap sticker in the [facility].

## 2017-12-31 NOTE — Telephone Encounter (Signed)
Pt is having surgery with Dr. Jacqualyn Posey tomorrow and she would like to have a handicap parking sticker. Also she wanted to know where is a good place to get crutches. Please give her a call.

## 2018-01-01 ENCOUNTER — Encounter: Payer: Self-pay | Admitting: Podiatry

## 2018-01-01 ENCOUNTER — Other Ambulatory Visit: Payer: Self-pay | Admitting: Podiatry

## 2018-01-01 ENCOUNTER — Telehealth: Payer: Self-pay | Admitting: *Deleted

## 2018-01-01 DIAGNOSIS — M2012 Hallux valgus (acquired), left foot: Secondary | ICD-10-CM

## 2018-01-01 MED ORDER — PROMETHAZINE HCL 25 MG PO TABS
25.0000 mg | ORAL_TABLET | Freq: Three times a day (TID) | ORAL | 0 refills | Status: DC | PRN
Start: 2018-01-01 — End: 2018-06-16

## 2018-01-01 MED ORDER — CLINDAMYCIN HCL 300 MG PO CAPS: 300.0000 mg | ORAL_CAPSULE | Freq: Three times a day (TID) | ORAL | 0 refills | Status: DC

## 2018-01-01 MED ORDER — OXYCODONE-ACETAMINOPHEN 5-325 MG PO TABS: 1.0000 | ORAL_TABLET | Freq: Four times a day (QID) | ORAL | 0 refills | Status: DC | PRN

## 2018-01-01 NOTE — Progress Notes (Signed)
Pre-operative Note  Patient presents to the [facility] today for surgical intervention of the LEFT foot for Lapidus bunionectomy. The surgical consent was reviewed with the patient and we discussed the procedure as well as the postoperative course. I again discussed all alternatives, risks, complications. I answered all of their questions to the best of my ability and they wish to proceed with surgery. No promises or guarantees were given as to the outcome of the surgery.   Reviewed arterial studies with her.   The surgical consent was signed.   Patient is NPO since midnight.  The patient does not have have a history of blood clots or bleeding disorders. She will do an aspirin postop.   I sent percocet, clindamycin and phenergan to her pharmacy.   She has a knee scooter and crutches already.   Husband is Herbie [place] and permission given to speak with him.   No further questions.   Celesta Gentile, [place]

## 2018-01-01 NOTE — Telephone Encounter (Signed)
-----   Message from Trula Slade, DPM sent at 12/25/2017  1:40 PM EDT ----- Tivis Ringer- please let her know that on the left side her circulation is normal and should be fine for the surgery on the left foot. The circulation is normal on the right side but it is a little decreased in the toes on the RIGHT side. Will watch the right side but seems to have adequate at this time and no intervention needed.

## 2018-01-01 NOTE — Telephone Encounter (Signed)
Pt scheduled for surgery 01/01/2018.

## 2018-01-03 ENCOUNTER — Telehealth: Payer: Self-pay | Admitting: Podiatry

## 2018-01-03 ENCOUNTER — Encounter: Payer: Self-pay | Admitting: Podiatry

## 2018-01-03 ENCOUNTER — Ambulatory Visit (INDEPENDENT_AMBULATORY_CARE_PROVIDER_SITE_OTHER): Payer: BC Managed Care – PPO | Admitting: Podiatry

## 2018-01-03 ENCOUNTER — Ambulatory Visit (INDEPENDENT_AMBULATORY_CARE_PROVIDER_SITE_OTHER): Payer: BC Managed Care – PPO

## 2018-01-03 VITALS — BP 101/70 | HR 82 | Temp 99.1°F

## 2018-01-03 DIAGNOSIS — M21612 Bunion of left foot: Secondary | ICD-10-CM

## 2018-01-03 DIAGNOSIS — Z9889 Other specified postprocedural states: Secondary | ICD-10-CM

## 2018-01-03 NOTE — Telephone Encounter (Signed)
Pt states she has been taking the percocet 2 tablets every 6 hours for the pain and in didn't seem to be helping, but it is better today, is taking 2 advil also, but her rheumatologist doesn't want her to due to Lupus. I told pt if she felt she needed to continue with the advil to contact her rheumatologist to discuss dosing. I told sometimes after surgery the dressing and ace wrap may be too tight, and removing the boot, open ended sock and ace wrap and gently tugging on the gauze in the areas of tenderness may loosen or reposition the gauze enough to be comfortable. I told pt to also while the ace is off, elevate the foot for 15 minutes, after place foot level with hip and rewrap the ace starting at the toes and wrap looser going up the foot and the leg, reapply the boot. Pt asked if she should have gotten a larger boot. I told her that if she rewrapped the ace to a single layer that would help also.

## 2018-01-03 NOTE — Telephone Encounter (Signed)
Patient states her foot hurts in places that's not the surgical site. The pain medication doesn't seem to be helping either. If you can please call patient back at [phone].

## 2018-01-03 NOTE — Progress Notes (Signed)
Subjective: [name] is a 47 y.o. is seen today in office s/p left foot Lapidus bunionectomy preformed on 01/01/2018.  She presents today she called and she was having some pain to the also aspect of her foot is suspicious that this was compared to marijuana in the department so that we can evaluate this and change the bandage.  She states the pain is controlled with current pain medicine she is also been taking ibuprofen intermittently as well.  She is internal ice and elevate as well.  She states the nerve block is completely worn off at this time.  Denies any systemic complaints such as fevers, chills, nausea, vomiting. No calf pain, chest pain, shortness of breath.   Objective: General: No acute distress, AAOx3  DP/PT pulses palpable 2/4, CRT < 3 sec to all digits.  Protective sensation intact. Motor function intact.  LEFT foot: Incision is well coapted without any evidence of dehiscence. There is no surrounding erythema, ascending cellulitis, fluctuance, crepitus, malodor, drainage/purulence. There is mild to moderate edema around the surgical site. There is mild to moderate pain along the surgical site.  There is no significant hematoma collection or any evidence of infection identified today.  The hallux clinically is sitting in a rectus position and she commented about how she is active in the history and the plan is much improved. Continued appear to be some areas of skin irritation to the lateral aspect the left foot as well as along the area the prior bunion.  This is likely due to increasing swelling in the bandage.  There is no skin breakdown. No other areas of tenderness to bilateral lower extremities.  No other open lesions or pre-ulcerative lesions.  No pain with calf compression, swelling, warmth, erythema.   Assessment and Plan:  Status post left foot surgery  -Treatment options discussed including all alternatives, risks, and complications -X-rays were obtained and  reviewed.  Hardware intact in the first metatarsal cuneiform joint.  On the AP view the first metatarsal is rectus.  The lateral view initially the first ray appear to be significantly elevated however her foot was turned.  I took the new x-ray and try to simulate as much weightbearing as I possibly could without putting weight on her foot.  There is still elevation of the first ray.  I did discuss this with her and her husband.  Although she is not putting full weight on her foot for the x-rays it is difficult to truly evaluate. -Antibiotic ointment and a bandage was applied.  Keep the dressing clean, dry, intact.  I did offload the lateral aspect of the foot.  I did dispense a large appointment as well today but that the other one was causing irritation.  -Ice/elevation -Pain medication as needed.  She is also to take anti-inflammatories between times of the pain medicine. -Aspirin daily -Finish course of antibiotics -We discussed elevation of the first ray.  During the surgery I did look at the foot she did appear to have adequate range of motion of the first MPJ.  Also modulus but did discuss the scar that she should like you had another surgery but is difficult to evaluate at this time.  I also discussed this with other doctors in the group as well as other podiatrists and agreed to hold off further surgery at this time.  -Monitor for any clinical signs or symptoms of infection and DVT/PE and directed to call the office immediately should any occur or go to the  ER. -Follow-up in 1 week or sooner if any problems arise. In the meantime, encouraged to call the office with any questions, concerns, change in symptoms.   Celesta Gentile, DPM

## 2018-01-03 NOTE — Telephone Encounter (Signed)
He called after-hours stating that she was having pain in her surgical foot.  She is a patient of Dr. Jacqualyn Posey and had a Lapidus performed on her left foot. . She says that she is experiencing pain on the outside bottom of her left foot.  . She states she is not having any pain or discomfort at the surgical site.  She denies chills or fever.  . She states the pain on the outside bottom of her left foot was 15 out of 10 but has now resolved and only has 8 out of 10.  Patient is nonweightbearing due to the nature  of the procedure.  . She has been placing ice on her foot at the surgical site as well as received a leg block for the surgery.  Since her pain was severe, but not at the surgical site. I felt this could be related to the bandaging.  I therefore told the patient to remove the foot from the [facility] and take another Percocet in one hour to help to control the pain.  I told her to call back if needed and c all the office in the morning if needed.   Gardiner Barefoot DPM

## 2018-01-06 ENCOUNTER — Encounter: Payer: BC Managed Care – PPO | Admitting: Podiatry

## 2018-01-10 ENCOUNTER — Encounter: Payer: Self-pay | Admitting: Podiatry

## 2018-01-10 ENCOUNTER — Ambulatory Visit (INDEPENDENT_AMBULATORY_CARE_PROVIDER_SITE_OTHER): Payer: BC Managed Care – PPO | Admitting: Podiatry

## 2018-01-10 VITALS — Temp 97.0°F

## 2018-01-10 DIAGNOSIS — Z9889 Other specified postprocedural states: Secondary | ICD-10-CM

## 2018-01-10 MED ORDER — CLINDAMYCIN HCL 300 MG PO CAPS: 300.0000 mg | ORAL_CAPSULE | Freq: Three times a day (TID) | ORAL | 0 refills | Status: DC

## 2018-01-10 NOTE — Progress Notes (Signed)
Subjective: [name] is a 47 y.o. is seen today in office s/p left foot Lapidus bunionectomy preformed on 01/01/2018. She presents today for a wound check.  She states that she is feeling much better.  She is getting frustrated with the lack of mobility but overall she is doing well.  She denies any fevers, chills, nausea, vomiting.  Denies any calf pain, chest pain, shortness of breath.  She has been in the cam boot nonweightbearing.  Objective: General: No acute distress, AAOx3  DP/PT pulses palpable 2/4, CRT < 3 sec to all digits.  Protective sensation intact. Motor function intact.  LEFT foot: Incision is well coapted without any evidence of dehiscence.  There does appear to be evidence of blood underneath the on the distal portion of the incision as well as the medial first metatarsal head.  There is no fluctuation within it there is no drainage or pus identified from the incision.  There is no increase in warmth of the foot and there is no significant erythema.  There is no areas of fluctuation or crepitation and there is no malodor.  Mild to moderate swelling to the left foot.  Some areas of skin irritation along the lateral aspect of the foot on the fifth metatarsal but there is no open sore identified. Toe is rectus.  No other open lesions or pre-ulcerative lesions.  No pain with calf compression, swelling, warmth, erythema.         Assessment and Plan:  Status post left foot surgery  -Treatment options discussed including all alternatives, risks, and complications -She had some bleeding under the skin present which is because this hemorrhagic Blister.  There is nothing I can drain today.  I applied a small amount of antibiotic ointment to the incision followed by dry sterile dressing.  Discussed with her that if there is any irritation she can loosen the bandage. -Remain nonweightbearing in the cam boot at all times.  Again I gave her a larger cam boot today avoid any  pressure to the foot. -He has no swelling the bleeding could be the result of the aspirin.  Hold off on aspirin at this time.  Continue pain medication as needed but she has not been needing this for several days but she did take some yesterday.  She also states that she has been more active and she did go to work and she also went to an event and her foot was down more than it should have been.  Encouraged elevation and ice. -As a precaution will start clindamycin in case of infection although the foot is not warm and she is afebrile.  -I will have her follow-up next week with Dr. Paulla Dolly for wound check.  At that point leave the sutures intact however she can start to change the bandage as needed.  Continue compression and elevation.  Continue nonweightbearing. -Monitor for any clinical signs or symptoms of infection and directed to call the office immediately should any occur or go to the ER.  Trula Slade DPM

## 2018-01-13 ENCOUNTER — Encounter: Payer: BC Managed Care – PPO | Admitting: Podiatry

## 2018-01-13 ENCOUNTER — Telehealth: Payer: Self-pay | Admitting: *Deleted

## 2018-01-13 NOTE — Telephone Encounter (Signed)
Called and left a message for the patient to call me back and I was calling to see how the patient was doing after she was seen by Dr Jacqualyn Posey on Friday and if there was any concerns or questions and to call the [place] office at [phone]. Lattie Haw

## 2018-01-14 ENCOUNTER — Telehealth: Payer: Self-pay | Admitting: Podiatry

## 2018-01-14 ENCOUNTER — Telehealth: Payer: Self-pay | Admitting: *Deleted

## 2018-01-14 NOTE — Telephone Encounter (Signed)
Patient called me back today and I stated that I was calling to check on her and the patient stated she was doing good and the swelling has gone down and will be bringing the x-large boot back and is wearing the large boot and does not seem to be pressing down on foot and I stated that we would see patient tomorrow with Dr March Rummage and to call the  office if any concerns or questions. [name]

## 2018-01-14 NOTE — Telephone Encounter (Signed)
[name] pt was returning your call. She can be reached at number on file.

## 2018-01-15 ENCOUNTER — Ambulatory Visit (INDEPENDENT_AMBULATORY_CARE_PROVIDER_SITE_OTHER): Payer: BC Managed Care – PPO | Admitting: Podiatry

## 2018-01-15 ENCOUNTER — Encounter: Payer: Self-pay | Admitting: Podiatry

## 2018-01-15 VITALS — BP 114/82 | HR 80 | Temp 98.6°F

## 2018-01-15 DIAGNOSIS — Z9889 Other specified postprocedural states: Secondary | ICD-10-CM

## 2018-01-20 ENCOUNTER — Telehealth: Payer: Self-pay | Admitting: *Deleted

## 2018-01-20 NOTE — Telephone Encounter (Signed)
Pt states she feels the ace wrap caused the quarter-sized blister on the heel, and they unwrapped and rewrapped the ace and it feels better. I told the heel blister is small enough it may reabsorb or if ruptures put light amount of neosporin to the site and it will eventually dry up. Pt states she still has the dressing in place from office visit with Dr. March Rummage last week. I told pt that was correct.

## 2018-01-21 ENCOUNTER — Ambulatory Visit (INDEPENDENT_AMBULATORY_CARE_PROVIDER_SITE_OTHER): Payer: Self-pay | Admitting: Podiatry

## 2018-01-21 DIAGNOSIS — M21612 Bunion of left foot: Secondary | ICD-10-CM

## 2018-01-21 DIAGNOSIS — Z9889 Other specified postprocedural states: Secondary | ICD-10-CM

## 2018-01-21 DIAGNOSIS — M21611 Bunion of right foot: Secondary | ICD-10-CM

## 2018-01-22 NOTE — Progress Notes (Signed)
Subjective: [name] is a 47 y.o. is seen today in office s/p left foot Lapidus bunionectomy preformed on 01/01/2018.  She presents today for scheduled appointment for concerns of a new blister to the heel.  She said her husband noticed this the end of the bandage.  She has not changed the rest of the bandage.  She saw Dr. March Rummage last week there was a blister along the surgical site.  She did complete the antibiotics.  Pain is been controlled and she is not taking pain medicine at this point.  She is remained nonweightbearing in the cam boot as well.  She has no new concerns otherwise today. She denies any fevers, chills, nausea, vomiting.  Denies any calf pain, chest pain, shortness of breath.  She has been in the cam boot nonweightbearing.  Objective: General: No acute distress, AAOx3  DP/PT pulses palpable 2/4, CRT < 3 sec to all digits.  Protective sensation intact. Motor function intact.  LEFT foot: Incision is well coapted without any evidence of dehiscence and sutures are intact.  There is evidence of old hemorrhagic blister along the incision site.  There is also area of old hemorrhagic type blister from the skin irritation the lateral aspect of foot as well as the posterior heel.  There is normal temperature gradient of the foot and there is no increase in warmth.  There is improved swelling from my last saw her.  There is no area of fluctuation or crepitation there is no drainage or pus coming from the incision.  There is no significant discomfort with MPJ range of motion.  Toes and rectus position. No other open lesions or pre-ulcerative lesions.  No pain with calf compression, swelling, warmth, erythema.            Assessment and Plan:  Status post left foot surgery, with blister formation  -Treatment options discussed including all alternatives, risks, and complications -Small amount of Betadine was applied to the areas followed by dry sterile dressing.  I want her to  start with daily dressing changes I did give her some supplies today for this.  Offloading areas at all times.  Underscore some range of motion exercises for the toes.  Continue ice elevation as well.  Pain medication as needed.  Watch for any further skin breakdown. -Monitor for any clinical signs or symptoms of infection and directed to call the office immediately should any occur or go to the ER. -RTC as scheduled or sooner if needed  Trula Slade DPM

## 2018-01-24 ENCOUNTER — Encounter: Payer: BC Managed Care – PPO | Admitting: Podiatry

## 2018-01-29 ENCOUNTER — Ambulatory Visit (INDEPENDENT_AMBULATORY_CARE_PROVIDER_SITE_OTHER): Payer: BC Managed Care – PPO

## 2018-01-29 ENCOUNTER — Ambulatory Visit (INDEPENDENT_AMBULATORY_CARE_PROVIDER_SITE_OTHER): Payer: BC Managed Care – PPO | Admitting: Podiatry

## 2018-01-29 DIAGNOSIS — Z9889 Other specified postprocedural states: Secondary | ICD-10-CM

## 2018-01-29 NOTE — Progress Notes (Signed)
Subjective: [name] is a 47 y.o. is seen today in office s/p left foot Lapidus bunionectomy preformed on 01/01/2018.  She states that she is doing well and she has been back at work she went to get any pain medication.  She location but some weight to her foot but very minimal and this is been very rarely.  she feels well she has any concerns today.  She denies any fevers, chills, nausea, vomiting.  Denies any calf pain, chest pain, shortness of breath.  She is remained in the cam boot.  She presents today with her mother.  She has no other concerns  Objective: General: No acute distress, AAOx3  DP/PT pulses palpable 2/4, CRT < 3 sec to all digits.  Protective sensation intact. Motor function intact.  LEFT foot: Incision is well coapted without any evidence of dehiscence and sutures are intact.  The sutures are dissolvable and they are doing well.  The area from the incision has dried up and there is some is a scab from it as sharp along the area but upon debridement of the loose tissue there is new, healthy skin underneath the area.  There is no fluctuation or crepitation or any malodor there is no drainage or pus there is no ascending sialitis.  Overall there is decreased swelling to the foot.  Pre-ulcerative area the lateral aspect of the foot and posterior heel which are also improving.  No other open lesions or pre-ulcerative lesions.  No pain with calf compression, swelling, warmth, erythema.       Assessment and Plan:  Status post left foot surgery, with blister formation  -Treatment options discussed including all alternatives, risks, and complications -X-rays were obtained and reviewed with her today.  Hardware intact.  We discussed her first metatarsal.  There is elevation in the lateral view although she is not completely weightbearing on this. Hardware intact -I did debride some loose tissue today without any complications or bleeding.  It appears to be underlying skin is  healing well.  No signs of infection to the area and we will continue to monitor very closely for this and keep an eye on her temperature as well. - recommend a small amount of Betadine to the area daily followed by dressing.   -Continue cam boot and nonweightbearing the majority time but she is looser but little weight on her foot.  Discussed gentle range of motion exercises with the first MPJ. -Monitor for any clinical signs or symptoms of infection and directed to call the office immediately should any occur or go to the ER.  Trula Slade DPM    -Small amount of Betadine was applied to the areas followed by dry sterile dressing.  I want her to start with daily dressing changes I did give her some supplies today for this.  Offloading areas at all times.  Underscore some range of motion exercises for the toes.  Continue ice elevation as well.  Pain medication as needed.  Watch for any further skin breakdown. -Monitor for any clinical signs or symptoms of infection and directed to call the office immediately should any occur or go to the ER. -RTC as scheduled or sooner if needed  Trula Slade DPM

## 2018-02-02 NOTE — Progress Notes (Signed)
  Subjective:  Patient ID: [name], female    DOB: 1970-11-05,  MRN: [id number]  Chief Complaint  Patient presents with  . Routine Post Op    Pt. stated," it's doing okay w/ the pain but w/ the blister it's worse Tx: clindamycine elevation and icing    DOS: 01/01/18 Procedure: Left Lapidus bunionectomy  47 y.o. female returns for post-op check. Denies N/V/F/Ch. Pain is controlled with current medications.  States the blister is doing worse.  Has been using clindamycin.  Elevating icing as directed  Objective:   General AA&O x3. Normal mood and affect.  Vascular Foot warm and well perfused.  Neurologic Gross sensation intact.  Dermatologic Skin healing well without signs of infection. Skin edges well coapted without signs of infection.   Orthopedic: Tenderness to palpation noted about the surgical site.    Assessment & Plan:  Patient was evaluated and treated and all questions answered.  S/p left Lapidus bunionectomy -Progressing as expected post-operatively. -Sutures: intact. -Medications refilled: none -Foot redressed. -Bulla incised with a 11 blade and gently removed.  Serosanguineous drainage only no purulence No follow-ups on file.

## 2018-02-07 ENCOUNTER — Ambulatory Visit (INDEPENDENT_AMBULATORY_CARE_PROVIDER_SITE_OTHER): Payer: BC Managed Care – PPO | Admitting: Podiatry

## 2018-02-07 DIAGNOSIS — Z9889 Other specified postprocedural states: Secondary | ICD-10-CM

## 2018-02-11 NOTE — Progress Notes (Signed)
Subjective: [name] is a 47 y.o. is seen today in office s/p left foot Lapidus bunionectomy preformed on 01/01/2018.  She presents today for wound check.  She states that the wound is doing much better.  Some skin to come off she is been washing it.  She denies any drainage or pus coming from the area denies any increase in swelling or redness noted the swelling is getting better.  She is put very minimal weight to the foot without the cam boot but she has started to walk with the cam boot utilizing crutches to offload some of the week.  She denies any systemic complaints including fevers, chills, nausea, vomiting.  No calf pain, chest pain, shortness of breath.    Objective: General: No acute distress, AAOx3  DP/PT pulses palpable 2/4, CRT < 3 sec to all digits.  Protective sensation intact. Motor function intact.  LEFT foot: Incision appears to be doing better.  Small eschar still present with the distal portion of the incision but overall a lot of dry, scabbed skin is starting to come off.  This is a very superficial skin breakdown there is no drainage or pus identified at this time there is no fluctuation or crepitation or any malodor.  There is no increase in warmth of the foot and gradually decreased swelling to the foot.  There is no pain with MPJ range of motion.  The toe does sit clinically somewhat elevated although does appear to be better than what it was previously when it was swollen. No other open lesions or pre-ulcerative lesions.  No pain with calf compression, swelling, warmth, erythema.         Assessment and Plan:  Status post left foot surgery  -Treatment options discussed including all alternatives, risks, and complications -Today did debride some of the loose tissue.  Overall the wound is getting better.  We will continue with antibiotic ointment dressing changes daily this is that she has been doing.  Continue partial weightbearing in the cam boot.  She can  start to slowly transition to more active walking in the cam boot.  Continue with ice elevation.  Monitoring signs or symptoms of infection to call me immediately should any occur or go to the emergency room.  Otherwise I will see her back in 1 week for follow-up.  Trula Slade DPM

## 2018-02-14 ENCOUNTER — Ambulatory Visit (INDEPENDENT_AMBULATORY_CARE_PROVIDER_SITE_OTHER): Payer: BC Managed Care – PPO | Admitting: Podiatry

## 2018-02-14 ENCOUNTER — Ambulatory Visit (INDEPENDENT_AMBULATORY_CARE_PROVIDER_SITE_OTHER): Payer: BC Managed Care – PPO

## 2018-02-14 DIAGNOSIS — B351 Tinea unguium: Secondary | ICD-10-CM

## 2018-02-14 DIAGNOSIS — R609 Edema, unspecified: Secondary | ICD-10-CM

## 2018-02-14 DIAGNOSIS — Z9889 Other specified postprocedural states: Secondary | ICD-10-CM | POA: Diagnosis not present

## 2018-02-14 DIAGNOSIS — Z79899 Other long term (current) drug therapy: Secondary | ICD-10-CM | POA: Diagnosis not present

## 2018-02-14 NOTE — Patient Instructions (Signed)

## 2018-02-15 LAB — CBC WITH DIFFERENTIAL/PLATELET
Basophils Absolute: 19 cells/uL (ref 0–200)
Basophils Relative: 0.4 %
EOS PCT: 0.2 %
Eosinophils Absolute: 9 cells/uL — ABNORMAL LOW (ref 15–500)
HCT: 28.3 % — ABNORMAL LOW (ref 35.0–45.0)
Hemoglobin: 9.2 g/dL — ABNORMAL LOW (ref 11.7–15.5)
Lymphs Abs: 1443 cells/uL (ref 850–3900)
MCH: 28.6 pg (ref 27.0–33.0)
MCHC: 32.5 g/dL (ref 32.0–36.0)
MCV: 87.9 fL (ref 80.0–100.0)
MPV: 11.4 fL (ref 7.5–12.5)
Monocytes Relative: 6.6 %
NEUTROS PCT: 62.1 %
Neutro Abs: 2919 cells/uL (ref 1500–7800)
PLATELETS: 202 10*3/uL (ref 140–400)
RBC: 3.22 10*6/uL — ABNORMAL LOW (ref 3.80–5.10)
RDW: 14.2 % (ref 11.0–15.0)
TOTAL LYMPHOCYTE: 30.7 %
WBC mixed population: 310 cells/uL (ref 200–950)
WBC: 4.7 10*3/uL (ref 3.8–10.8)

## 2018-02-15 LAB — HEPATIC FUNCTION PANEL
AG RATIO: 1.6 (calc) (ref 1.0–2.5)
ALT: 12 U/L (ref 6–29)
AST: 15 U/L (ref 10–35)
Albumin: 3.7 g/dL (ref 3.6–5.1)
Alkaline phosphatase (APISO): 53 U/L (ref 33–115)
BILIRUBIN INDIRECT: 0.3 mg/dL (ref 0.2–1.2)
BILIRUBIN TOTAL: 0.4 mg/dL (ref 0.2–1.2)
Bilirubin, Direct: 0.1 mg/dL (ref 0.0–0.2)
GLOBULIN: 2.3 g/dL (ref 1.9–3.7)
TOTAL PROTEIN: 6 g/dL — AB (ref 6.1–8.1)

## 2018-02-19 ENCOUNTER — Telehealth: Payer: Self-pay | Admitting: *Deleted

## 2018-02-19 MED ORDER — TERBINAFINE HCL 250 MG PO TABS: 250.0000 mg | ORAL_TABLET | Freq: Every day | ORAL | 0 refills | Status: DC

## 2018-02-19 NOTE — Progress Notes (Signed)
Subjective: [name] is a 47 y.o. is seen today in office s/p left foot Lapidus bunionectomy preformed on 01/01/2018.  She states that she was using antibiotic ointment but she is switched back to using Betadine and this is been more helpful for her.  She is noticed some skin is starting to come off.  She currently denies any drainage or pus coming from the area she is having minimal pain.  Denies any increase in swelling or redness.  She is no other concerns in regards to the surgery however she does have concerns about toenail fungus and she is asking about treatment for nail fungus.  The nails are thickened discolored not causing any pain.  She denies any systemic complaints including fevers, chills, nausea, vomiting.  No calf pain, chest pain, shortness of breath.    Objective: General: No acute distress, AAOx3  DP/PT pulses palpable 2/4, CRT < 3 sec to all digits.  Protective sensation intact. Motor function intact.  LEFT foot: Incision appears to be doing better. Eschar is still evident through the distal aspect of the incision however the more on the proximal two thirds is healing well.  There is no area of fluctuation or crepitation is no drainage or pus.  There is mild however improved edema there is no erythema or increase in warmth of the foot.  There is no pain with MPJ range of motion appears to be adequate on the nonweightbearing exam.  The toe clinically does not somewhat elevated. The nails are hypertrophic, dystrophic with yellow-brown discoloration.  There is no pain to the nails there is no surrounding redness or drainage or any signs of infection noted today. No pain with calf compression, swelling, warmth, erythema.           Assessment and Plan:  Status post left foot surgery  -Treatment options discussed including all alternatives, risks, and complications -X-rays were obtained reviewed.  There is a elevation of the first ray present of the first metatarsal  however hardware intact. -The distal portion of the incision appears to be draining to a scab.  Order to continue with Betadine as this is been drying out more.  Continue daily dressing changes.  She can wash the area with soap and water.  The other areas of skin break on the lateral half of the foot is also starting to peel off and there is normal skin underneath.  The heel remains roughly unchanged and there is no signs of infection.  Discussed range of motion exercises for the first MPJ.  She can transition to weightbearing in the cam boot. Monitor for any clinical signs or symptoms of infection and directed to call the office immediately should any occur or go to the ER. -In regards to nail fungus we discussed treatment options.  After discussion she is looking proceed with oral Lamisil.  We discussed side effects and success rates.  We will check a CBC and LFT prior to starting the medication.  Trula Slade DPM

## 2018-02-19 NOTE — Telephone Encounter (Signed)
Left message informing pt of Dr. Jacqualyn Posey' s review of results and to call for medication instructions.

## 2018-02-19 NOTE — Telephone Encounter (Signed)
-----   Message from Trula Slade, DPM sent at 02/18/2018  7:08 AM EDT ----- Val- please let her know that the blood work is normal. Please send in lamisil 250mg  daily for 90 days. Thanks.

## 2018-02-20 NOTE — Telephone Encounter (Signed)
Pt returned call to get lab results and instructions, states it is okay to leave a message on her cellphone. Left message informing pt of Dr. Leigh Aurora review of results and orders for the lamisil and 6 week appt.

## 2018-02-24 ENCOUNTER — Encounter: Payer: Self-pay | Admitting: Podiatry

## 2018-02-24 ENCOUNTER — Ambulatory Visit (INDEPENDENT_AMBULATORY_CARE_PROVIDER_SITE_OTHER): Payer: BC Managed Care – PPO | Admitting: Podiatry

## 2018-02-24 VITALS — Temp 97.5°F

## 2018-02-24 DIAGNOSIS — Z9889 Other specified postprocedural states: Secondary | ICD-10-CM

## 2018-02-25 NOTE — Progress Notes (Signed)
   Subjective: [name] is a 47 y.o. is seen today in office s/p left foot Lapidus bunionectomy preformed on 01/01/2018.  Overall she states the pain is getting better.  She still putting Betadine on the area daily.  Denies any drainage or pus or any increase in swelling or redness.  She has some minimal discomfort but she has been walking in the cam boot and done well.  She is not taking pain medicine.  She is continue to work.  She has no other concerns today. She denies any systemic complaints including fevers, chills, nausea, vomiting.  No calf pain, chest pain, shortness of breath.   She started Lamisil yesterday.   Objective: General: No acute distress, AAOx3  DP/PT pulses palpable 2/4, CRT < 3 sec to all digits.  Protective sensation intact. Motor function intact.  LEFT foot: Incision appears to be doing better.  A scab is present along the distal portion of the incision and there is no drainage or pus there is no fluctuation or crepitation.  There is no significant discomfort to the area.  There is no drainage or pus or any malodor.  No clinical signs of infection present.  Skin the lateral aspect of the foot as well as the heel is healed well and there is normal color.  No other open lesions. The nails are hypertrophic, dystrophic with yellow-brown discoloration.  There is no pain to the nails there is no surrounding redness or drainage or any signs of infection noted today. No pain with calf compression, swelling, warmth, erythema.           Assessment and Plan:  Status post left foot surgery  -Treatment options discussed including all alternatives, risks, and complications -Incision is doing better.  Scab is present.  Continue with Betadine to the area daily however if it begins to dry she can switch back to antibiotic ointment.  She can wash the area with soap and water daily to keep covered when showering.  Continue to ambulate in the cam boot.  Continue to ice  elevate. -Monitor for any clinical signs or symptoms of infection and directed to call the office immediately should any occur or go to the ER. -RTC 2 weeks or sooner if needed  Trula Slade DPM

## 2018-03-10 ENCOUNTER — Ambulatory Visit (INDEPENDENT_AMBULATORY_CARE_PROVIDER_SITE_OTHER): Payer: Self-pay | Admitting: Podiatry

## 2018-03-10 ENCOUNTER — Ambulatory Visit (INDEPENDENT_AMBULATORY_CARE_PROVIDER_SITE_OTHER): Payer: BC Managed Care – PPO

## 2018-03-10 DIAGNOSIS — Z9889 Other specified postprocedural states: Secondary | ICD-10-CM

## 2018-03-10 DIAGNOSIS — M2012 Hallux valgus (acquired), left foot: Secondary | ICD-10-CM

## 2018-03-13 NOTE — Progress Notes (Signed)
Subjective: [name] is a 47 y.o. is seen today in office s/p left foot Lapidus bunionectomy preformed on 01/01/2018.  Overall she states that she is doing better.  She states that she has been going barefoot at home and she is having no significant pain when going barefoot.  Otherwise she is been wearing a walking boot.  Internal ice and elevate.  She does alternate between Neosporin and Betadine on the area of the incision.  She denies any drainage or pus or any increase in redness or swelling to her foot.  Overall she feels well and she denies any fevers, chills, nausea, vomiting, chest pain, shortness of breath, calf pain.  No other complaints.    Objective: General: No acute distress, AAOx3  DP/PT pulses palpable 2/4, CRT < 3 sec to all digits.  Protective sensation intact. Motor function intact.  LEFT foot: Incision appears to be doing better.  Scab on the distal portion is healing well.  Starting the left knee underlying skin.  Underlying incision and wound appear to be intact and healing well.  There is no drainage or pus and there is no increase in warmth.  Mild swelling to the foot.  There is no erythema or increase in warmth.  No significant discomfort to palpation of the surgical site. There is mild elevation of the hallux compared to the other digits.  There is no significant prominence of the lesser metatarsals. No pain with calf compression, swelling, warmth, erythema.       Assessment and Plan:  Status post left foot surgery  -Treatment options discussed including all alternatives, risks, and complications -X-ray was obtained and reviewed.  Hardware intact.  Elevation of the first ray noted.  I discussed this again with her today. -Overall she is doing better.  I want her to continue the cam boot for now given the wound healing.  Once the wound is healed she can start to transition back into regular shoe as tolerable.  She also hold off on physical therapy if she changes  her mind she is in the right me know.  Continue with daily dressing changes to the incision site.  We will begin the area wet until the scab is come off.  Monitoring signs or symptoms of infection.  Continue ice and elevation.  Once the incision is well-healed we can start wearing a compression wrap which I gave to her today.  *Check CBC, LFT next appointment as she is on Lamisil. *X-ray next appointment  Trula Slade DPM

## 2018-03-28 ENCOUNTER — Encounter: Payer: BC Managed Care – PPO | Admitting: Podiatry

## 2018-03-31 ENCOUNTER — Ambulatory Visit (INDEPENDENT_AMBULATORY_CARE_PROVIDER_SITE_OTHER): Payer: BC Managed Care – PPO | Admitting: Podiatry

## 2018-03-31 ENCOUNTER — Ambulatory Visit (INDEPENDENT_AMBULATORY_CARE_PROVIDER_SITE_OTHER): Payer: BC Managed Care – PPO

## 2018-03-31 ENCOUNTER — Encounter: Payer: Self-pay | Admitting: Podiatry

## 2018-03-31 DIAGNOSIS — M2012 Hallux valgus (acquired), left foot: Secondary | ICD-10-CM

## 2018-03-31 DIAGNOSIS — M779 Enthesopathy, unspecified: Secondary | ICD-10-CM

## 2018-03-31 DIAGNOSIS — Z79899 Other long term (current) drug therapy: Secondary | ICD-10-CM

## 2018-03-31 DIAGNOSIS — M722 Plantar fascial fibromatosis: Secondary | ICD-10-CM

## 2018-03-31 DIAGNOSIS — Z9889 Other specified postprocedural states: Secondary | ICD-10-CM

## 2018-03-31 DIAGNOSIS — M778 Other enthesopathies, not elsewhere classified: Secondary | ICD-10-CM

## 2018-03-31 DIAGNOSIS — B351 Tinea unguium: Secondary | ICD-10-CM | POA: Diagnosis not present

## 2018-04-02 NOTE — Progress Notes (Signed)
Subjective: [name] is a 47 y.o. is seen today in office s/p left foot Lapidus bunionectomy preformed on 01/01/2018.  Overall she is getting better.  She presents today wearing a regular shoe.  She can start until she can some pain in the ball of her foot since going back to a regular shoe but she denies any callus or skin breakdown.  She gets some swelling to both of her legs and feet which is been ongoing issue for her.  She is also been using the Lamisil and she has not noticed any side effects but she has not noticed much improvement of the toenails at this point.  Denies any pain to the toenails denies any redness or drainage or any swelling. Denies any fevers, chills, nausea, vomiting, chest pain, shortness of breath, calf pain.  No other complaints.    Objective: General: No acute distress, AAOx3  DP/PT pulses palpable 2/4, CRT < 3 sec to all digits.  Protective sensation intact. Motor function intact.  LEFT foot: Incision appears to be doing better.  Scab is present to the distal portion of the incision but there is no drainage or pus identified today.  There is no fluctuation or crepitation.  There is restriction of the first MPJ range of motion dorsiflexion.  There is mild prominence to submetatarsal 2 and 3 area there is no callus formation or skin breakdown or pre-ulcerative areas identified today.  There is no other areas of tenderness.  Significant flatfoot deformity is present. The nails continue be hypertrophic, dystrophic with discoloration.  There is some very minimal clearing on the proximal nail borders.  There is no pain in the nails there is no synovitis or drainage or any signs of infection.      Assessment and Plan:  Status post left foot surgery; onychomycosis  -Treatment options discussed including all alternatives, risks, and complications -X-ray was obtained and reviewed.  Hardware intact.  Elevation of the first ray noted.  There does appear to be increased  consolidation to the first metatarsal cuneiform joint. -Surgical standpoint she is doing well.  And start physical therapy.  She is Artie wearing a regular shoe.  I do want to get a orthotic to help offload the lesser metatarsal heads.  I discussed with the getting regular seen by him today she was more for the insert.  I also dispensed a gel metatarsal offloading pads to help take pressure off the area as well.  In general she needs an orthotic given her flat feet, occasional arch pain as well as capsulitis lesser metatarsophalangeal joint. -We will recheck a CBC, LFT.  Continue with Lamisil and monitor for side effects.  Discussed duration of use, success rates.  Trula Slade DPM

## 2018-04-03 LAB — CBC WITH DIFFERENTIAL/PLATELET
BASOS ABS: 32 {cells}/uL (ref 0–200)
Basophils Relative: 0.7 %
EOS ABS: 18 {cells}/uL (ref 15–500)
EOS PCT: 0.4 %
HCT: 26.6 % — ABNORMAL LOW (ref 35.0–45.0)
Hemoglobin: 8.5 g/dL — ABNORMAL LOW (ref 11.7–15.5)
Lymphs Abs: 1490 cells/uL (ref 850–3900)
MCH: 28.3 pg (ref 27.0–33.0)
MCHC: 32 g/dL (ref 32.0–36.0)
MCV: 88.7 fL (ref 80.0–100.0)
MPV: 12.1 fL (ref 7.5–12.5)
Monocytes Relative: 6.7 %
NEUTROS ABS: 2660 {cells}/uL (ref 1500–7800)
NEUTROS PCT: 59.1 %
PLATELETS: 181 10*3/uL (ref 140–400)
RBC: 3 10*6/uL — ABNORMAL LOW (ref 3.80–5.10)
RDW: 14.2 % (ref 11.0–15.0)
TOTAL LYMPHOCYTE: 33.1 %
WBC mixed population: 302 cells/uL (ref 200–950)
WBC: 4.5 10*3/uL (ref 3.8–10.8)

## 2018-04-03 LAB — HEPATIC FUNCTION PANEL
AG Ratio: 1.3 (calc) (ref 1.0–2.5)
ALKALINE PHOSPHATASE (APISO): 57 U/L (ref 33–115)
ALT: 17 U/L (ref 6–29)
AST: 23 U/L (ref 10–35)
Albumin: 3.5 g/dL — ABNORMAL LOW (ref 3.6–5.1)
Bilirubin, Direct: 0.1 mg/dL (ref 0.0–0.2)
Globulin: 2.7 g/dL (calc) (ref 1.9–3.7)
Indirect Bilirubin: 0.2 mg/dL (calc) (ref 0.2–1.2)
Total Bilirubin: 0.3 mg/dL (ref 0.2–1.2)
Total Protein: 6.2 g/dL (ref 6.1–8.1)

## 2018-04-04 ENCOUNTER — Telehealth: Payer: Self-pay | Admitting: *Deleted

## 2018-04-04 NOTE — Telephone Encounter (Signed)
-----   Message from Trula Slade, DPM sent at 04/04/2018  4:07 PM EDT ----- Please let her know that the blood work is OK to continue Lamisil.

## 2018-04-04 NOTE — Telephone Encounter (Signed)
Left message informing pt of Dr. Leigh Aurora review of results and orders.

## 2018-04-07 ENCOUNTER — Encounter: Payer: BC Managed Care – PPO | Admitting: Podiatry

## 2018-04-21 ENCOUNTER — Ambulatory Visit: Payer: BC Managed Care – PPO | Admitting: Orthotics

## 2018-04-21 ENCOUNTER — Ambulatory Visit (INDEPENDENT_AMBULATORY_CARE_PROVIDER_SITE_OTHER): Payer: Self-pay | Admitting: Podiatry

## 2018-04-21 ENCOUNTER — Encounter: Payer: Self-pay | Admitting: Podiatry

## 2018-04-21 DIAGNOSIS — M722 Plantar fascial fibromatosis: Secondary | ICD-10-CM

## 2018-04-21 DIAGNOSIS — Z79899 Other long term (current) drug therapy: Secondary | ICD-10-CM

## 2018-04-21 DIAGNOSIS — Z9889 Other specified postprocedural states: Secondary | ICD-10-CM

## 2018-04-21 NOTE — Progress Notes (Signed)
Patient came in today to pick up custom made foot orthotics.  The goals were accomplished and the patient reported no dissatisfaction with said orthotics.  Patient was advised of breakin period and how to report any issues. 

## 2018-04-21 NOTE — Progress Notes (Signed)
Subjective: [name] is a 47 y.o. is seen today in office s/p left foot Lapidus bunionectomy preformed on 01/01/2018.  She states that overall she is doing better.  The gel offloading has been very helpful for her.  She been wearing regular tennis shoe without any problems.  She did go to physical therapy one time but she was referred to the physical therapist in our office.  She presents the office pick up orthotics with Liliane Channel.  She has no other concerns.  She feels the incision is almost completely healed. Denies any fevers, chills, nausea, vomiting, chest pain, shortness of breath, calf pain.  No other complaints.    Objective: General: No acute distress, AAOx3  DP/PT pulses palpable 2/4, CRT < 3 sec to all digits.  Protective sensation intact. Motor function intact.  LEFT foot: Incision appears to be doing better.  Small scab on the distal portion of the incision there is no open sore identified there is no drainage pus there is no surrounding edema, erythema, ascending cellulitis or any other signs of infection noted today.  Decreased range of motion the first MPJ.  There is no significant tenderness palpation the surgical site.  Minimal swelling to the foot but that she has chronic swelling to bilateral lower extremities and appears to be equal.  There is no pain with calf compression, swelling, warmth, erythema.  Assessment and Plan:  Status post left foot surgery; onychomycosis  -Treatment options discussed including all alternatives, risks, and complications -Overall she is doing better.  Refer to physical therapy in the office in the prescription for [facility] was written today.  Continue with supportive shoes.  Continue gel offloading pads.  Rec dispensed orthotics today.  Continue moisturizer to the incision site daily.  No signs of infection continue to monitor closely.  Return in about 4 years (around 04/21/2022).  Trula Slade DPM

## 2018-04-22 NOTE — Addendum Note (Signed)
Addended by: Cranford Mon R on: 04/22/2018 04:24 PM   Modules accepted: Orders

## 2018-05-15 DIAGNOSIS — R609 Edema, unspecified: Secondary | ICD-10-CM | POA: Insufficient documentation

## 2018-05-16 DIAGNOSIS — Z780 Asymptomatic menopausal state: Secondary | ICD-10-CM | POA: Insufficient documentation

## 2018-05-16 DIAGNOSIS — E559 Vitamin D deficiency, unspecified: Secondary | ICD-10-CM | POA: Insufficient documentation

## 2018-05-19 ENCOUNTER — Ambulatory Visit: Payer: BC Managed Care – PPO | Admitting: Podiatry

## 2018-05-27 ENCOUNTER — Ambulatory Visit (INDEPENDENT_AMBULATORY_CARE_PROVIDER_SITE_OTHER): Payer: BC Managed Care – PPO | Admitting: Podiatry

## 2018-05-27 DIAGNOSIS — R609 Edema, unspecified: Secondary | ICD-10-CM

## 2018-05-27 DIAGNOSIS — Z9889 Other specified postprocedural states: Secondary | ICD-10-CM

## 2018-05-27 DIAGNOSIS — Z79899 Other long term (current) drug therapy: Secondary | ICD-10-CM

## 2018-05-27 DIAGNOSIS — B351 Tinea unguium: Secondary | ICD-10-CM

## 2018-05-28 NOTE — Progress Notes (Signed)
Subjective: [name] is a 47 y.o. is seen today in office s/p left foot Lapidus bunionectomy preformed on 01/01/2018.  She states that she is doing well.  She is doing physical therapy is been helpful as well.  The orthotics feel great.  She is wearing a closed in shoe.  She has been no significant discomfort.  She does feel that she has a lack of ability to her big toe other than that she feels well.  She is still on Lamisil without any side effects.  She has noticed some improvement in the nails.  She has no other concerns today. Denies any fevers, chills, nausea, vomiting, chest pain, shortness of breath, calf pain.  No other complaints.    Objective: General: No acute distress, AAOx3  DP/PT pulses palpable 2/4, CRT < 3 sec to all digits.  Protective sensation intact. Motor function intact.  LEFT foot: Incision appears to be doing better.  Over the area where she had a scab is a very small superficial area of skin breakdown but there is almost complete healing of the incision.  There is no drainage or pus.  There is some minimal edema to the area and there is no erythema warmth.  Minimal range of motion of the first MPJ unfortunately.  First ray is somewhat elevated.  No tenderness palpation the surgical site.Mild improvement of the toenails.  No pain in the nails no surrounding redness or drainage.  Chronic swelling bilateral legs  There is no pain with calf compression, swelling, warmth, erythema.  Assessment and Plan:  Status post left foot surgery; onychomycosis  -Treatment options discussed including all alternatives, risks, and complications -We will continue physical therapy.  Continue with supportive shoes and orthotics.  She is doing well with this.  Over time she can start to try to wear her more "normal" she is also given her foot type but would advise against wearing a flat shoe.  I did again discuss with the elevation of the first ray and lack of motion.  At this point is not  causing any changes in gait or significant pain.  We will continue to monitor. -Her protein levels elevated which may be causing some of the swelling.  Also will order a venous reflux study.  She had ongoing swelling to both of her legs for some time but there is no evidence of DVT. -We will recheck CBC and LFT due to Lamisil. -Follow-up 6 weeks or sooner if needed.  Trula Slade DPM

## 2018-05-29 ENCOUNTER — Telehealth: Payer: Self-pay | Admitting: *Deleted

## 2018-05-29 DIAGNOSIS — R609 Edema, unspecified: Secondary | ICD-10-CM

## 2018-05-29 NOTE — Telephone Encounter (Signed)
-----   Message from Trula Slade, DPM sent at 05/28/2018  3:40 PM EST ----- Can you please order a venous reflux study due to b/l swelling? Thanks.

## 2018-06-10 ENCOUNTER — Ambulatory Visit
Admission: RE | Admit: 2018-06-10 | Discharge: 2018-06-10 | Disposition: A | Discharge status: Home / Self Care | Payer: BC Managed Care – PPO | Source: Ambulatory Visit | Attending: Family Medicine | Admitting: Family Medicine

## 2018-06-10 ENCOUNTER — Ambulatory Visit (HOSPITAL_COMMUNITY)
Admission: RE | Admit: 2018-06-10 | Discharge: 2018-06-10 | Disposition: A | Discharge status: Home / Self Care | Payer: BC Managed Care – PPO | Source: Ambulatory Visit | Attending: Family Medicine | Admitting: Family Medicine

## 2018-06-10 ENCOUNTER — Other Ambulatory Visit: Payer: Self-pay | Admitting: Family Medicine

## 2018-06-10 DIAGNOSIS — R609 Edema, unspecified: Secondary | ICD-10-CM

## 2018-06-10 DIAGNOSIS — J9801 Acute bronchospasm: Secondary | ICD-10-CM

## 2018-06-13 NOTE — Progress Notes (Signed)
Cardiology Office Note   Date:  06/16/2018   ID:  [name], DOB 18-Oct-1970, MRN [id number]  PCP:  [name], MD  Cardiologist:   Peter Martinique, MD   Chief Complaint  Patient presents with  . Leg Swelling    since august      History of Present Illness: [name] is a 47 y.o. female who is seen at the request of Caren Macadam MD for evaluation of DOE and edema. She has a history of HTN, prediabetes, and lupus with nephritis. She has a history of bilateral avascular necrosis of the hips and is s/p bilateral hip surgery. LE arterial dopplers in June showed no significant arterial obstruction. Venous dopplers done 06/10/18 showed no DVT or significant evidence of venous insufficiency.  She reports that since July she has increased swelling in her lower extremities. It is worse towards the end of the day. She has recently been started on Lasix without much improvement. She states she had swelling in the past with her nephritis but her protein levels have been stable and she has regular follow up with Dr Amil Amen with Rheumatology and Dr. Moshe Cipro with Nephrology. In the last week in October she developed a cough and some increased SOB. Was given a Zpak and Tussionex without improvement. Over past 5 days she was given an inhaler and steroids and reports improvement with this. Denies any fever, chest pain, orthopnea, PND. Cough is not productive. Weight is increased from baseline.   She has been on a number of therapies for her lupus in the past including steroids,  Cellcept and Imuran but is currently on Plaquenil.     Past Medical History:  Diagnosis Date  . Avascular necrosis ([place])   . Hypertension   . Lupus ([place])   . Lupus nephritis ([employer])   . Prediabetes 10/2011  . 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 bilat hip grafts     2007  . BUNIONECTOMY  2018  . HIP SURGERY     x2  . TOTAL HIP  ARTHROPLASTY  08/2016 and 10/2016   x2  . UMBILICAL HERNIA REPAIR       Current Outpatient Medications  Medication Sig Dispense Refill  . albuterol (VENTOLIN HFA) 108 (90 Base) MCG/ACT inhaler Ventolin HFA 90 mcg/actuation aerosol inhaler    . dextromethorphan (DELSYM) 30 MG/5ML liquid 2 tsp every 12 hours as needed    . furosemide (LASIX) 20 MG tablet Take 1 tablet by mouth daily as needed for fluid.     . hydroxychloroquine (PLAQUENIL) 200 MG tablet Take by mouth 2 (two) times daily.    . Influenza vac split quadrivalent PF (FLUARIX) 0.5 ML injection Fluarix Quad 2017-2018 (PF) 60 mcg (15 mcg x 4)/0.5 mL IM syringe  inject 0.5 milliliter intramuscularly    . losartan (COZAAR) 50 MG tablet Take 50 mg by mouth daily.    . pneumococcal 23 valent vaccine (PNEUMOVAX 23) 25 MCG/0.5ML injection Pneumovax 23 25 mcg/0.5 mL injection syringe  inject 0.5 milliliter intramuscularly    . potassium chloride SA (K-DUR,KLOR-CON) 20 MEQ tablet potassium chloride ER 20 mEq tablet,extended release(part/cryst)    . Vitamin D, Ergocalciferol, (DRISDOL) 1.25 MG (50000 UT) CAPS capsule cholecalciferol (vitamin D3) 50,000 unit capsule  Take one capsule by oral route once a week for 8 weeks     No current facility-administered medications for this visit.     Allergies:  Lisinopril and Penicillins    Social History:  The patient  reports that she has never smoked. She has never used smokeless tobacco. She reports that she does not drink alcohol or use drugs.   Family History:  The patient's family history includes Anemia in her father; Breast cancer in her maternal grandmother and mother; Clotting disorder in her father; Heart disease in her mother; Hypertension in her father.    ROS:  Please see the history of present illness.   Otherwise, review of systems are positive for none.   All other systems are reviewed and negative.    PHYSICAL EXAM: VS:  BP (!) 141/85   Pulse (!) 58   Ht 5\' 7"  (1.702 m)    Wt 212 lb 12.8 oz (96.5 kg)   BMI 33.33 kg/m  , BMI Body mass index is 33.33 kg/m. GEN: Well nourished, well developed BF, in no acute distress  HEENT: normal  Neck: no JVD, carotid bruits, or masses Cardiac: RRR; no murmurs, rubs, or gallops Respiratory:  clear to auscultation bilaterally, normal work of breathing GI: soft, nontender, nondistended, + BS MS: no deformity or atrophy. Bilateral LE edema 2+ to lower thighs. Skin: warm and dry, no rash Neuro:  Strength and sensation are intact Psych: euthymic mood, full affect   EKG:  EKG is ordered today. The ekg ordered today demonstrates NSR rate 59. Low voltage.    Recent Labs: 04/03/2018: ALT 17; Hemoglobin 8.5; Platelets 181    Lipid Panel No results found for: CHOL, TRIG, HDL, CHOLHDL, VLDL, LDLCALC, LDLDIRECT    Wt Readings from Last 3 Encounters:  06/16/18 212 lb 12.8 oz (96.5 kg)  04/21/17 202 lb (91.6 kg)  12/17/13 209 lb (94.8 kg)      Other studies Reviewed: Additional studies/ records that were reviewed today include:  Dated 06/10/18; Hgb 9.6, potassium 3.7, creatinine 0.86. TSH 4.26. ALT normal.   CHEST - 2 VIEW  COMPARISON:  04/21/2017  FINDINGS: Cardiac shadow is enlarged. The lungs are well aerated bilaterally. Small right pleural effusion is seen new from the prior exam. No acute bony abnormality is noted.  IMPRESSION: Small right pleural effusion.  No definitive infiltrate is seen.   Electronically Signed   By: Inez Catalina M.D.   On: 06/10/2018 13:00   ASSESSMENT AND PLAN:  1.  DOE and LE edema. This certainly could be related to lupus with Nephritis. I am concerned she may have a pericardial effusion based on low voltage on Ecg and CXR findings of cardiomegaly with somewhat globular cardiac shadow. She also has a right pleural effusion. Recent blood work unremarkable except for anemia.  - will schedule for Echocardiogram. For now continue current diuretic dose but may need more  depending on Echo results.  2. History of lupus with nephritis. Followed by Nephrology. 3. HTN.  4. Chronic anemia.    Current medicines are reviewed at length with the patient today.  The patient does not have concerns regarding medicines.  The following changes have been made:  no change  Labs/ tests ordered today include:   Orders Placed This Encounter  Procedures  . EKG 12-Lead  . ECHOCARDIOGRAM COMPLETE     Disposition:   FU with me pending results of Echo Signed, Peter Martinique, MD  06/16/2018 9:37 AM    [facility] [address] [place], [place], [address] Phone [phone], Fax [phone]

## 2018-06-16 ENCOUNTER — Ambulatory Visit: Payer: BC Managed Care – PPO | Admitting: Cardiology

## 2018-06-16 ENCOUNTER — Encounter: Payer: Self-pay | Admitting: Cardiology

## 2018-06-16 VITALS — BP 141/85 | HR 58 | Ht 67.0 in | Wt 212.8 lb

## 2018-06-16 DIAGNOSIS — R0602 Shortness of breath: Secondary | ICD-10-CM | POA: Diagnosis not present

## 2018-06-16 DIAGNOSIS — R600 Localized edema: Secondary | ICD-10-CM | POA: Diagnosis not present

## 2018-06-16 DIAGNOSIS — I517 Cardiomegaly: Secondary | ICD-10-CM

## 2018-06-16 DIAGNOSIS — M329 Systemic lupus erythematosus, unspecified: Secondary | ICD-10-CM

## 2018-06-16 NOTE — Patient Instructions (Signed)
We will schedule you for an Echocardiogram  Restrict your salt intake.

## 2018-06-17 ENCOUNTER — Other Ambulatory Visit: Payer: Self-pay

## 2018-06-17 ENCOUNTER — Ambulatory Visit (HOSPITAL_COMMUNITY): Payer: BC Managed Care – PPO | Attending: Cardiology

## 2018-06-17 ENCOUNTER — Telehealth: Payer: Self-pay | Admitting: *Deleted

## 2018-06-17 DIAGNOSIS — M329 Systemic lupus erythematosus, unspecified: Secondary | ICD-10-CM

## 2018-06-17 DIAGNOSIS — I517 Cardiomegaly: Secondary | ICD-10-CM | POA: Diagnosis not present

## 2018-06-17 DIAGNOSIS — R0602 Shortness of breath: Secondary | ICD-10-CM | POA: Insufficient documentation

## 2018-06-17 DIAGNOSIS — R600 Localized edema: Secondary | ICD-10-CM | POA: Insufficient documentation

## 2018-06-17 NOTE — Telephone Encounter (Signed)
-----   Message from Trula Slade, DPM sent at 06/17/2018  7:38 AM EST ----- Val- please let her know that there is no evidence of DVT. On the left side it looks like one of the veins is not working properly which could lead to swelling. Wound continue with compression socks.

## 2018-06-17 NOTE — Telephone Encounter (Signed)
Left message on mobile phone for pt to call for results.

## 2018-06-18 ENCOUNTER — Other Ambulatory Visit: Payer: Self-pay

## 2018-06-18 DIAGNOSIS — I272 Pulmonary hypertension, unspecified: Secondary | ICD-10-CM

## 2018-06-18 MED ORDER — FUROSEMIDE 40 MG PO TABS: 40.0000 mg | ORAL_TABLET | Freq: Every day | ORAL | 3 refills | Status: DC

## 2018-06-20 ENCOUNTER — Telehealth: Payer: Self-pay | Admitting: *Deleted

## 2018-06-20 ENCOUNTER — Telehealth (HOSPITAL_COMMUNITY): Payer: Self-pay | Admitting: Cardiology

## 2018-06-20 NOTE — Telephone Encounter (Signed)
I informed pt of Dr. Leigh Aurora review of results. Pt states has echocardiogram and pulmonary hypertension, and is being referred to the [facility]. I told pt to hold off on the compression hose and discuss with her cardiovascular doctor.

## 2018-06-20 NOTE — Telephone Encounter (Signed)
Per DB patient will need a work in appt within 2 weeks.  appt made 12/19 @ 240  Patient aware of appt information and parking instructions given to patient

## 2018-06-20 NOTE — Telephone Encounter (Signed)
-----   Message from Trula Slade, DPM sent at 06/17/2018  7:38 AM EST ----- Val- please let her know that there is no evidence of DVT. On the left side it looks like one of the veins is not working properly which could lead to swelling. Wound continue with compression socks.

## 2018-07-03 ENCOUNTER — Other Ambulatory Visit (HOSPITAL_COMMUNITY): Payer: Self-pay

## 2018-07-03 ENCOUNTER — Ambulatory Visit (HOSPITAL_COMMUNITY)
Admission: RE | Admit: 2018-07-03 | Discharge: 2018-07-03 | Disposition: A | Discharge status: Home / Self Care | Payer: BC Managed Care – PPO | Source: Ambulatory Visit | Attending: Internal Medicine | Admitting: Internal Medicine

## 2018-07-03 ENCOUNTER — Encounter (HOSPITAL_COMMUNITY): Payer: Self-pay | Admitting: Internal Medicine

## 2018-07-03 VITALS — BP 138/82 | HR 101 | Wt 202.4 lb

## 2018-07-03 DIAGNOSIS — R0602 Shortness of breath: Secondary | ICD-10-CM

## 2018-07-03 DIAGNOSIS — R600 Localized edema: Secondary | ICD-10-CM

## 2018-07-03 DIAGNOSIS — Z79899 Other long term (current) drug therapy: Secondary | ICD-10-CM | POA: Diagnosis not present

## 2018-07-03 DIAGNOSIS — I10 Essential (primary) hypertension: Secondary | ICD-10-CM | POA: Insufficient documentation

## 2018-07-03 DIAGNOSIS — M3214 Glomerular disease in systemic lupus erythematosus: Secondary | ICD-10-CM | POA: Insufficient documentation

## 2018-07-03 DIAGNOSIS — D649 Anemia, unspecified: Secondary | ICD-10-CM | POA: Insufficient documentation

## 2018-07-03 DIAGNOSIS — Z8249 Family history of ischemic heart disease and other diseases of the circulatory system: Secondary | ICD-10-CM | POA: Insufficient documentation

## 2018-07-03 DIAGNOSIS — Z803 Family history of malignant neoplasm of breast: Secondary | ICD-10-CM | POA: Diagnosis not present

## 2018-07-03 DIAGNOSIS — Z832 Family history of diseases of the blood and blood-forming organs and certain disorders involving the immune mechanism: Secondary | ICD-10-CM | POA: Insufficient documentation

## 2018-07-03 DIAGNOSIS — I272 Pulmonary hypertension, unspecified: Secondary | ICD-10-CM | POA: Insufficient documentation

## 2018-07-03 DIAGNOSIS — Z888 Allergy status to other drugs, medicaments and biological substances status: Secondary | ICD-10-CM | POA: Insufficient documentation

## 2018-07-03 DIAGNOSIS — R7303 Prediabetes: Secondary | ICD-10-CM | POA: Insufficient documentation

## 2018-07-03 DIAGNOSIS — M329 Systemic lupus erythematosus, unspecified: Secondary | ICD-10-CM | POA: Diagnosis not present

## 2018-07-03 DIAGNOSIS — Z880 Allergy status to penicillin: Secondary | ICD-10-CM | POA: Diagnosis not present

## 2018-07-03 DIAGNOSIS — I158 Other secondary hypertension: Secondary | ICD-10-CM

## 2018-07-03 LAB — PROTIME-INR
INR: 1.02
Prothrombin Time: 13.3 seconds (ref 11.4–15.2)

## 2018-07-03 LAB — CBC
HEMATOCRIT: 28.6 % — AB (ref 36.0–46.0)
Hemoglobin: 8.8 g/dL — ABNORMAL LOW (ref 12.0–15.0)
MCH: 27.6 pg (ref 26.0–34.0)
MCHC: 30.8 g/dL (ref 30.0–36.0)
MCV: 89.7 fL (ref 80.0–100.0)
Platelets: 245 10*3/uL (ref 150–400)
RBC: 3.19 MIL/uL — ABNORMAL LOW (ref 3.87–5.11)
RDW: 16.4 % — ABNORMAL HIGH (ref 11.5–15.5)
WBC: 4.1 10*3/uL (ref 4.0–10.5)
nRBC: 0 % (ref 0.0–0.2)

## 2018-07-03 LAB — BRAIN NATRIURETIC PEPTIDE: B Natriuretic Peptide: 179.4 pg/mL — ABNORMAL HIGH (ref 0.0–100.0)

## 2018-07-03 LAB — BASIC METABOLIC PANEL
Anion gap: 8 (ref 5–15)
BUN: 13 mg/dL (ref 6–20)
CO2: 26 mmol/L (ref 22–32)
Calcium: 8.3 mg/dL — ABNORMAL LOW (ref 8.9–10.3)
Chloride: 107 mmol/L (ref 98–111)
Creatinine, Ser: 0.84 mg/dL (ref 0.44–1.00)
GFR calc non Af Amer: >60 mL/min (ref >60)
Glucose, Bld: 55 mg/dL — ABNORMAL LOW (ref 70–99)
Potassium: 3.5 mmol/L (ref 3.5–5.1)
Sodium: 141 mmol/L (ref 135–145)

## 2018-07-03 MED ORDER — SPIRONOLACTONE 25 MG PO TABS: 12.5000 mg | ORAL_TABLET | Freq: Every day | ORAL | 6 refills | Status: DC

## 2018-07-03 NOTE — H&P (View-Only) (Signed)
Pulmonary Hypertension Clinic Note    Referring Physician: PCP: [name], MD PCP-Cardiologist: No primary care provider on file.   HPI:  [name] is a 47 y.o. 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   Seen by Dr. Martinique 06/16/18 with complaints of worsening LE edema since July. Worse towards the end of the day. She was started on lasix previously without much improvement. She has h/o the same due to her nephritis, but protein levels have been stable. She follows with Dr. Amil Amen with Rheumatology and Dr. Moshe Cipro with Nephrology. Sent for Echo as below which showed pulmonary HTN, likely due to her Lupus.   Echo 06/17/18 LVEF 65-70%, Mild MR, Severe RAE, Severe TR, Trivial PI, PA peak pressure 68 mm/Hg, trivial pericardial effusion.  She presents today to establish in the pulmonary HTN clinic. She is feeling "much better" than she was several weeks ago. She had a cough at the end of October that was attributed to a URI and treated with ABX, with no improvement. Around that same time, increased protein noted in urine. She is planning on a 24 hr urine with renal. She gets SOB with mild to moderate exertion, sometimes from the car into her office building. She can get a lot further if she is not carrying anything. She denies SOB with ADLs, such as bathing or dressing. Edema in her legs has improved, still worse in the evenings. Denies orthopnea, sleeps on 2-3 pillows for comfort. Snores, but not heavily. No notable apneic episodes. She occasionally has chest discomfort, says it happens about once daily. She is not sure if it is related to her SOB. Not always exertional or and not related to diet.  Her mother has pulmonary HTN thought 2/2 to valvular disease. Dad has high blood pressure. The patients HTN was diagnosed in 2002 along with her Lupus. Denies recent syncope or near syncope. (Has had  fainting in the distant past <2007)  Review of Systems: [y] = yes, [ ]  = no   General: Weight gain [ ] ; Weight loss [ ] ; Anorexia [ ] ; Fatigue [y]; Fever [ ] ; Chills [ ] ; Weakness [ ]   Cardiac: Chest pain/pressure [y]; Resting SOB [ ] ; Exertional SOB [y]; Orthopnea [ ] ; Pedal Edema [y]; Palpitations [ ] ; Syncope [ ] ; Presyncope [ ] ; Paroxysmal nocturnal dyspnea[ ]   Pulmonary: Cough [y]; Wheezing[ ] ; Hemoptysis[ ] ; Sputum [ ] ; Snoring [ ]   GI: Vomiting[ ] ; Dysphagia[ ] ; Melena[ ] ; Hematochezia [ ] ; Heartburn[ ] ; Abdominal pain [ ] ; Constipation [ ] ; Diarrhea [ ] ; BRBPR [ ]   GU: Hematuria[ ] ; Dysuria [ ] ; Nocturia[ ]   Vascular: Pain in legs with walking [ ] ; Pain in feet with lying flat [ ] ; Non-healing sores [ ] ; Stroke [ ] ; TIA [ ] ; Slurred speech [ ] ;  Neuro: Headaches[ ] ; Vertigo[ ] ; Seizures[ ] ; Paresthesias[ ] ;Blurred vision [ ] ; Diplopia [ ] ; Vision changes [ ]   Ortho/Skin: Arthritis [y]; Joint pain [y]; Muscle pain [ ] ; Joint swelling [ ] ; Back Pain [ ] ; Rash [ ]   Psych: Depression[ ] ; Anxiety[ ]   Heme: Bleeding problems [ ] ; Clotting disorders [ ] ; Anemia [ ]   Endocrine: Diabetes [ ] ; Thyroid dysfunction[ ]    Past Medical History:  Diagnosis Date  . Avascular necrosis ([place])   . Hypertension   . Lupus ([place])   . Lupus nephritis ([place])   . Prediabetes 10/2011  . TTP (thrombotic thrombopenic  purpura) ([place]) 2007    Current Outpatient Medications  Medication Sig Dispense Refill  . furosemide (LASIX) 40 MG tablet Take 1 tablet (40 mg total) by mouth daily. (Patient taking differently: Take 80 mg by mouth daily. ) 90 tablet 3  . hydroxychloroquine (PLAQUENIL) 200 MG tablet Take by mouth 2 (two) times daily.    Marland Kitchen losartan (COZAAR) 50 MG tablet Take 50 mg by mouth daily.    . Vitamin D, Ergocalciferol, (DRISDOL) 1.25 MG (50000 UT) CAPS capsule cholecalciferol (vitamin D3) 50,000 unit capsule  Take one capsule by oral route once a week for 8 weeks    . albuterol (VENTOLIN HFA) 108 (90  Base) MCG/ACT inhaler Ventolin HFA 90 mcg/actuation aerosol inhaler    . dextromethorphan (DELSYM) 30 MG/5ML liquid 2 tsp every 12 hours as needed    . Influenza vac split quadrivalent PF (FLUARIX) 0.5 ML injection Fluarix Quad 2017-2018 (PF) 60 mcg (15 mcg x 4)/0.5 mL IM syringe  inject 0.5 milliliter intramuscularly    . pneumococcal 23 valent vaccine (PNEUMOVAX 23) 25 MCG/0.5ML injection Pneumovax 23 25 mcg/0.5 mL injection syringe  inject 0.5 milliliter intramuscularly    . potassium chloride SA (K-DUR,KLOR-CON) 20 MEQ tablet potassium chloride ER 20 mEq tablet,extended release(part/cryst)     No current facility-administered medications for this encounter.     Allergies  Allergen Reactions  . Lisinopril     angioedema  . Penicillins Hives      Social History   Socioeconomic History  . Marital status: Married    Spouse name: Not on file  . Number of children: 2  . Years of education: Not on file  . Highest education level: Not on file  Occupational History  . Not on file  Social Needs  . Financial resource strain: Not on file  . Food insecurity:    Worry: Not on file    Inability: Not on file  . Transportation needs:    Medical: Not on file    Non-medical: Not on file  Tobacco Use  . Smoking status: Never Smoker  . Smokeless tobacco: Never Used  Substance and Sexual Activity  . Alcohol use: No  . Drug use: No  . Sexual activity: Yes    Partners: Male    Birth control/protection: Pill, None    Comment: LoLoestrin Fe   Lifestyle  . Physical activity:    Days per week: Not on file    Minutes per session: Not on file  . Stress: Not on file  Relationships  . Social connections:    Talks on phone: Not on file    Gets together: Not on file    Attends religious service: Not on file    Active member of club or organization: Not on file    Attends meetings of clubs or organizations: Not on file    Relationship status: Not on file  . Intimate partner violence:     Fear of current or ex partner: Not on file    Emotionally abused: Not on file    Physically abused: Not on file    Forced sexual activity: Not on file  Other Topics Concern  . Not on file  Social History Narrative  . Not on file      Family History  Problem Relation Age of Onset  . Breast cancer Mother   . Heart disease Mother   . Breast cancer Maternal Grandmother   . Clotting disorder Father   . Hypertension Father   .  Anemia Father    Vitals:   07/03/18 1422 07/03/18 1426  BP: (!) 150/86 138/82  Pulse: (!) 101   SpO2: 98%   Weight: 91.8 kg (202 lb 6.4 oz)    PHYSICAL EXAM: General:  Well appearing. No respiratory difficulty HEENT: Normal Neck: Supple. JVP 8-9 cm. Carotids 2+ bilat; no bruits. No lymphadenopathy or thyromegaly appreciated. Cor: PMI nondisplaced. Regular rate & rhythm. Mild TR with slightly increased P2. No RV lift.  Lungs: Clear Abdomen: Soft, nontender, nondistended. No hepatosplenomegaly. No bruits or masses. Good bowel sounds. Extremities: No cyanosis, clubbing, or rash. Chronic changes and scarring from multiple surgeries. 2+ woody edema to knees. Neuro: Alert & oriented x 3, cranial nerves grossly intact. moves all 4 extremities w/o difficulty. Affect pleasant.  ECG: 06/16/18 showed sinus bradycardia 59 bpm, personally reviewed.   ASSESSMENT & PLAN:  1. Pulmonary HTN - Echo 06/17/18 LVEF 65-70%, Mild MR, Severe RAE, Severe TR, Trivial PI, PA peak pressure 68 mm/Hg, trivial pericardial effusion.  - Likely primarily WHO group 1 with Lupus, but cannot rule out component of group 2 and 3 with body habitus and HTN.  - NYHA II-III symptoms.  - No need for VQ scan and serologies with known Lupus.  - Will plan for [facility] further assess pressures and to look for Lupus associated CAD with intermittent chest pressure. Pre-Cath labs today.  - Continue lasix 80 mg daily and potassium 20 meq daily for now. May do better on torsemide.  2. Lupus with history of  Nephritis - Followed by Dr. Amil Amen and Dr. Moshe Cipro (Nephrology) - On Plaquenil per Dr. Amil Amen.  - She may need pre-cath hydration.  - Plans 24 hr urine collection this weekend. 3. HTN - Continue losartan 50 mg daily 4. Chronic anemia - Hgb 8.5 03/2018  Will plan on RHC next with, with coronary angiography if renal function permits.   Shirley Friar, PA-C 07/03/18   Patient seen and examined with the above-signed Advanced Practice Provider and/or Housestaff. I personally reviewed laboratory data, imaging studies and relevant notes. I independently examined the patient and formulated the important aspects of the plan. I have edited the note to reflect any of my changes or salient points. I have personally discussed the plan with the patient and/or family.  47 y/o woman with SLE referred for further evaluation of PAH and RV failure. Echo reviewed personally and shows dilated RV with normal function. RVSP ~70. She has significant volume overload on exam. Lasix recently increased to 80mg  daily. She also has intermittent CP.   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 Given CP and risk for CAD with SLE with proceed with R/L heart cath. Suspect she will need selective pulmonary vasodilators.   Add spiro 12.5mg  daily. Watch renal function closely. May need to switch to torsemide soon.   Glori Bickers, MD  6:44 PM

## 2018-07-03 NOTE — Patient Instructions (Signed)
START Spironolactone 12.5mg  daily  Labs today We will only contact you if something comes back abnormal or we need to make some changes. Otherwise no news is good news!   [facility] [place] [id number] [place] [address] Dept: [phone] Loc: [phone]  [name]  07/03/2018  You are scheduled for a Cardiac Catheterization on Friday, December 27 with Dr. Glori Bickers.  1. Please arrive at the [facility] (Main Entrance A) at [facility]: [address] [place], [place] [address] at 10:00 AM (This time is two hours before your procedure to ensure your preparation). Free valet parking service is available.   Special note: Every effort is made to have your procedure done on time. Please understand that emergencies sometimes delay scheduled procedures.  2. Diet: Do not eat solid foods after midnight.  The patient may have clear liquids until 5am upon the day of the procedure.  3. Labs: drawn today  4. Medication instructions in preparation for your procedure:   Contrast Allergy: No   You can take you morning medicines with small sips of water.  5. Plan for one night stay--bring personal belongings. 6. Bring a current list of your medications and current insurance cards. 7. You MUST have a responsible person to drive you home. 8. Someone MUST be with you the first 24 hours after you arrive home or your discharge will be delayed. 9. Please wear clothes that are easy to get on and off and wear slip-on shoes.  Thank you for allowing [place] to care for you!   -- [facility]

## 2018-07-03 NOTE — Progress Notes (Signed)
Pulmonary Hypertension Clinic Note    Referring Physician: PCP: [name], MD PCP-Cardiologist: No primary care provider on file.   HPI:  [name] is a 47 y.o. 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   Seen by Dr. Martinique 06/16/18 with complaints of worsening LE edema since July. Worse towards the end of the day. She was started on lasix previously without much improvement. She has h/o the same due to her nephritis, but protein levels have been stable. She follows with Dr. Amil Amen with Rheumatology and Dr. Moshe Cipro with Nephrology. Sent for Echo as below which showed pulmonary HTN, likely due to her Lupus.   Echo 06/17/18 LVEF 65-70%, Mild MR, Severe RAE, Severe TR, Trivial PI, PA peak pressure 68 mm/Hg, trivial pericardial effusion.  She presents today to establish in the pulmonary HTN clinic. She is feeling "much better" than she was several weeks ago. She had a cough at the end of October that was attributed to a URI and treated with ABX, with no improvement. Around that same time, increased protein noted in urine. She is planning on a 24 hr urine with renal. She gets SOB with mild to moderate exertion, sometimes from the car into her office building. She can get a lot further if she is not carrying anything. She denies SOB with ADLs, such as bathing or dressing. Edema in her legs has improved, still worse in the evenings. Denies orthopnea, sleeps on 2-3 pillows for comfort. Snores, but not heavily. No notable apneic episodes. She occasionally has chest discomfort, says it happens about once daily. She is not sure if it is related to her SOB. Not always exertional or and not related to diet.  Her mother has pulmonary HTN thought 2/2 to valvular disease. Dad has high blood pressure. The patients HTN was diagnosed in 2002 along with her Lupus. Denies recent syncope or near syncope. (Has had  fainting in the distant past <2007)  Review of Systems: [y] = yes, [ ]  = no   General: Weight gain [ ] ; Weight loss [ ] ; Anorexia [ ] ; Fatigue [y]; Fever [ ] ; Chills [ ] ; Weakness [ ]   Cardiac: Chest pain/pressure [y]; Resting SOB [ ] ; Exertional SOB [y]; Orthopnea [ ] ; Pedal Edema [y]; Palpitations [ ] ; Syncope [ ] ; Presyncope [ ] ; Paroxysmal nocturnal dyspnea[ ]   Pulmonary: Cough [y]; Wheezing[ ] ; Hemoptysis[ ] ; Sputum [ ] ; Snoring [ ]   GI: Vomiting[ ] ; Dysphagia[ ] ; Melena[ ] ; Hematochezia [ ] ; Heartburn[ ] ; Abdominal pain [ ] ; Constipation [ ] ; Diarrhea [ ] ; BRBPR [ ]   GU: Hematuria[ ] ; Dysuria [ ] ; Nocturia[ ]   Vascular: Pain in legs with walking [ ] ; Pain in feet with lying flat [ ] ; Non-healing sores [ ] ; Stroke [ ] ; TIA [ ] ; Slurred speech [ ] ;  Neuro: Headaches[ ] ; Vertigo[ ] ; Seizures[ ] ; Paresthesias[ ] ;Blurred vision [ ] ; Diplopia [ ] ; Vision changes [ ]   Ortho/Skin: Arthritis [y]; Joint pain [y]; Muscle pain [ ] ; Joint swelling [ ] ; Back Pain [ ] ; Rash [ ]   Psych: Depression[ ] ; Anxiety[ ]   Heme: Bleeding problems [ ] ; Clotting disorders [ ] ; Anemia [ ]   Endocrine: Diabetes [ ] ; Thyroid dysfunction[ ]    Past Medical History:  Diagnosis Date  . Avascular necrosis ([place])   . Hypertension   . Lupus ([place])   . Lupus nephritis ([place])   . Prediabetes 10/2011  . 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 furosemide (LASIX) 40 MG tablet Take 1 tablet (40 mg total) by mouth daily. (Patient taking differently: Take 80 mg by mouth daily. ) 90 tablet 3  . hydroxychloroquine (PLAQUENIL) 200 MG tablet Take by mouth 2 (two) times daily.    Marland Kitchen losartan (COZAAR) 50 MG tablet Take 50 mg by mouth daily.    . Vitamin D, Ergocalciferol, (DRISDOL) 1.25 MG (50000 UT) CAPS capsule cholecalciferol (vitamin D3) 50,000 unit capsule  Take one capsule by oral route once a week for 8 weeks    . albuterol (VENTOLIN HFA) 108 (90  Base) MCG/ACT inhaler Ventolin HFA 90 mcg/actuation aerosol inhaler    . dextromethorphan (DELSYM) 30 MG/5ML liquid 2 tsp every 12 hours as needed    . Influenza vac split quadrivalent PF (FLUARIX) 0.5 ML injection Fluarix Quad 2017-2018 (PF) 60 mcg (15 mcg x 4)/0.5 mL IM syringe  inject 0.5 milliliter intramuscularly    . pneumococcal 23 valent vaccine (PNEUMOVAX 23) 25 MCG/0.5ML injection Pneumovax 23 25 mcg/0.5 mL injection syringe  inject 0.5 milliliter intramuscularly    . potassium chloride SA (K-DUR,KLOR-CON) 20 MEQ tablet potassium chloride ER 20 mEq tablet,extended release(part/cryst)     No current facility-administered medications for this encounter.     Allergies  Allergen Reactions  . Lisinopril     angioedema  . Penicillins Hives      Social History   Socioeconomic History  . Marital status: Married    Spouse name: Not on file  . Number of children: 2  . Years of education: Not on file  . Highest education level: Not on file  Occupational History  . Not on file  Social Needs  . Financial resource strain: Not on file  . Food insecurity:    Worry: Not on file    Inability: Not on file  . Transportation needs:    Medical: Not on file    Non-medical: Not on file  Tobacco Use  . Smoking status: Never Smoker  . Smokeless tobacco: Never Used  Substance and Sexual Activity  . Alcohol use: No  . Drug use: No  . Sexual activity: Yes    Partners: Male    Birth control/protection: Pill, None    Comment: LoLoestrin Fe   Lifestyle  . Physical activity:    Days per week: Not on file    Minutes per session: Not on file  . Stress: Not on file  Relationships  . Social connections:    Talks on phone: Not on file    Gets together: Not on file    Attends religious service: Not on file    Active member of club or organization: Not on file    Attends meetings of clubs or organizations: Not on file    Relationship status: Not on file  . Intimate partner violence:     Fear of current or ex partner: Not on file    Emotionally abused: Not on file    Physically abused: Not on file    Forced sexual activity: Not on file  Other Topics Concern  . Not on file  Social History Narrative  . Not on file      Family History  Problem Relation Age of Onset  . Breast cancer Mother   . Heart disease Mother   . Breast cancer Maternal Grandmother   . Clotting disorder Father   . Hypertension Father   .  Anemia Father    Vitals:   07/03/18 1422 07/03/18 1426  BP: (!) 150/86 138/82  Pulse: (!) 101   SpO2: 98%   Weight: 91.8 kg (202 lb 6.4 oz)    PHYSICAL EXAM: General:  Well appearing. No respiratory difficulty HEENT: Normal Neck: Supple. JVP 8-9 cm. Carotids 2+ bilat; no bruits. No lymphadenopathy or thyromegaly appreciated. Cor: PMI nondisplaced. Regular rate & rhythm. Mild TR with slightly increased P2. No RV lift.  Lungs: Clear Abdomen: Soft, nontender, nondistended. No hepatosplenomegaly. No bruits or masses. Good bowel sounds. Extremities: No cyanosis, clubbing, or rash. Chronic changes and scarring from multiple surgeries. 2+ woody edema to knees. Neuro: Alert & oriented x 3, cranial nerves grossly intact. moves all 4 extremities w/o difficulty. Affect pleasant.  ECG: 06/16/18 showed sinus bradycardia 59 bpm, personally reviewed.   ASSESSMENT & PLAN:  1. Pulmonary HTN - Echo 06/17/18 LVEF 65-70%, Mild MR, Severe RAE, Severe TR, Trivial PI, PA peak pressure 68 mm/Hg, trivial pericardial effusion.  - Likely primarily WHO group 1 with Lupus, but cannot rule out component of group 2 and 3 with body habitus and HTN.  - NYHA II-III symptoms.  - No need for VQ scan and serologies with known Lupus.  - Will plan for [facility] further assess pressures and to look for Lupus associated CAD with intermittent chest pressure. Pre-Cath labs today.  - Continue lasix 80 mg daily and potassium 20 meq daily for now. May do better on torsemide.  2. Lupus with history of  Nephritis - Followed by Dr. Amil Amen and Dr. Moshe Cipro (Nephrology) - On Plaquenil per Dr. Amil Amen.  - She may need pre-cath hydration.  - Plans 24 hr urine collection this weekend. 3. HTN - Continue losartan 50 mg daily 4. Chronic anemia - Hgb 8.5 03/2018  Will plan on RHC next with, with coronary angiography if renal function permits.   Shirley Friar, PA-C 07/03/18   Patient seen and examined with the above-signed Advanced Practice Provider and/or Housestaff. I personally reviewed laboratory data, imaging studies and relevant notes. I independently examined the patient and formulated the important aspects of the plan. I have edited the note to reflect any of my changes or salient points. I have personally discussed the plan with the patient and/or family.  47 y/o woman with SLE referred for further evaluation of PAH and RV failure. Echo reviewed personally and shows dilated RV with normal function. RVSP ~70. She has significant volume overload on exam. Lasix recently increased to 80mg  daily. She also has intermittent CP.   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 Given CP and risk for CAD with SLE with proceed with R/L heart cath. Suspect she will need selective pulmonary vasodilators.   Add spiro 12.5mg  daily. Watch renal function closely. May need to switch to torsemide soon.   Glori Bickers, MD  6:44 PM

## 2018-07-10 ENCOUNTER — Ambulatory Visit: Payer: BC Managed Care – PPO | Admitting: Podiatry

## 2018-07-11 ENCOUNTER — Other Ambulatory Visit: Payer: Self-pay

## 2018-07-11 ENCOUNTER — Ambulatory Visit (HOSPITAL_COMMUNITY)
Admission: RE | Admit: 2018-07-11 | Discharge: 2018-07-11 | Disposition: A | Discharge status: Home / Self Care | Payer: BC Managed Care – PPO | Attending: Internal Medicine | Admitting: Internal Medicine

## 2018-07-11 ENCOUNTER — Encounter (HOSPITAL_COMMUNITY)
Admission: RE | Disposition: A | Discharge status: Home / Self Care | Payer: Self-pay | Source: Home / Self Care | Attending: Internal Medicine

## 2018-07-11 DIAGNOSIS — Z8249 Family history of ischemic heart disease and other diseases of the circulatory system: Secondary | ICD-10-CM | POA: Diagnosis not present

## 2018-07-11 DIAGNOSIS — R7303 Prediabetes: Secondary | ICD-10-CM | POA: Insufficient documentation

## 2018-07-11 DIAGNOSIS — M3214 Glomerular disease in systemic lupus erythematosus: Secondary | ICD-10-CM | POA: Diagnosis not present

## 2018-07-11 DIAGNOSIS — D649 Anemia, unspecified: Secondary | ICD-10-CM | POA: Insufficient documentation

## 2018-07-11 DIAGNOSIS — Z888 Allergy status to other drugs, medicaments and biological substances status: Secondary | ICD-10-CM | POA: Insufficient documentation

## 2018-07-11 DIAGNOSIS — R0789 Other chest pain: Secondary | ICD-10-CM | POA: Insufficient documentation

## 2018-07-11 DIAGNOSIS — Z880 Allergy status to penicillin: Secondary | ICD-10-CM | POA: Diagnosis not present

## 2018-07-11 DIAGNOSIS — I10 Essential (primary) hypertension: Secondary | ICD-10-CM | POA: Insufficient documentation

## 2018-07-11 DIAGNOSIS — I2721 Secondary pulmonary arterial hypertension: Secondary | ICD-10-CM

## 2018-07-11 DIAGNOSIS — Z79899 Other long term (current) drug therapy: Secondary | ICD-10-CM | POA: Insufficient documentation

## 2018-07-11 DIAGNOSIS — I272 Pulmonary hypertension, unspecified: Secondary | ICD-10-CM

## 2018-07-11 HISTORY — PX: RIGHT/LEFT HEART CATH AND CORONARY ANGIOGRAPHY: CATH118266

## 2018-07-11 LAB — POCT I-STAT 3, VENOUS BLOOD GAS (G3P V)
Acid-Base Excess: 1 mmol/L (ref 0.0–2.0)
Acid-Base Excess: 3 mmol/L — ABNORMAL HIGH (ref 0.0–2.0)
BICARBONATE: 26.9 mmol/L (ref 20.0–28.0)
Bicarbonate: 25.9 mmol/L (ref 20.0–28.0)
Bicarbonate: 28.1 mmol/L — ABNORMAL HIGH (ref 20.0–28.0)
O2 Saturation: 75 %
O2 Saturation: 76 %
O2 Saturation: 79 %
PO2 VEN: 41 mmHg (ref 32.0–45.0)
TCO2: 27 mmol/L (ref 22–32)
TCO2: 28 mmol/L (ref 22–32)
TCO2: 29 mmol/L (ref 22–32)
pCO2, Ven: 44.9 mmHg (ref 44.0–60.0)
pCO2, Ven: 46.2 mmHg (ref 44.0–60.0)
pCO2, Ven: 46.5 mmHg (ref 44.0–60.0)
pH, Ven: 7.369 (ref 7.250–7.430)
pH, Ven: 7.372 (ref 7.250–7.430)
pH, Ven: 7.39 (ref 7.250–7.430)
pO2, Ven: 43 mmHg (ref 32.0–45.0)
pO2, Ven: 45 mmHg (ref 32.0–45.0)

## 2018-07-11 LAB — POCT I-STAT 3, ART BLOOD GAS (G3+)
ACID-BASE EXCESS: 3 mmol/L — AB (ref 0.0–2.0)
Bicarbonate: 28.3 mmol/L — ABNORMAL HIGH (ref 20.0–28.0)
O2 SAT: 99 %
TCO2: 30 mmol/L (ref 22–32)
pCO2 arterial: 43.2 mmHg (ref 32.0–48.0)
pH, Arterial: 7.424 (ref 7.350–7.450)
pO2, Arterial: 160 mmHg — ABNORMAL HIGH (ref 83.0–108.0)

## 2018-07-11 SURGERY — RIGHT/LEFT HEART CATH AND CORONARY ANGIOGRAPHY
Anesthesia: LOCAL

## 2018-07-11 MED ORDER — LIDOCAINE HCL (PF) 1 % IJ SOLN
INTRAMUSCULAR | Status: DC | PRN
  Administered 2018-07-11 (×2): 2 mL via SUBCUTANEOUS

## 2018-07-11 MED ORDER — HEPARIN (PORCINE) IN NACL 1000-0.9 UT/500ML-% IV SOLN
INTRAVENOUS | Status: AC
  Filled 2018-07-11: qty 1000

## 2018-07-11 MED ORDER — SODIUM CHLORIDE 0.9 % IV SOLN
INTRAVENOUS | Status: DC
  Administered 2018-07-11: 11:00:00 via INTRAVENOUS

## 2018-07-11 MED ORDER — SODIUM CHLORIDE 0.9% FLUSH: 3.0000 mL | Freq: Two times a day (BID) | INTRAVENOUS | Status: DC

## 2018-07-11 MED ORDER — HEPARIN SODIUM (PORCINE) 1000 UNIT/ML IJ SOLN
INTRAMUSCULAR | Status: DC | PRN
  Administered 2018-07-11: 4500 [IU] via INTRAVENOUS

## 2018-07-11 MED ORDER — SODIUM CHLORIDE 0.9 % IV SOLN: INTRAVENOUS | Status: AC

## 2018-07-11 MED ORDER — VERAPAMIL HCL 2.5 MG/ML IV SOLN
INTRAVENOUS | Status: DC | PRN
  Administered 2018-07-11: 10 mL via INTRA_ARTERIAL

## 2018-07-11 MED ORDER — ASPIRIN 81 MG PO CHEW
CHEWABLE_TABLET | ORAL | Status: AC
  Administered 2018-07-11: 81 mg via ORAL
  Filled 2018-07-11: qty 1

## 2018-07-11 MED ORDER — ASPIRIN 81 MG PO CHEW
81.0000 mg | CHEWABLE_TABLET | ORAL | Status: AC
  Administered 2018-07-11: 81 mg via ORAL

## 2018-07-11 MED ORDER — VERAPAMIL HCL 2.5 MG/ML IV SOLN
INTRAVENOUS | Status: AC
  Filled 2018-07-11: qty 2

## 2018-07-11 MED ORDER — ONDANSETRON HCL 4 MG/2ML IJ SOLN: 4.0000 mg | Freq: Four times a day (QID) | INTRAMUSCULAR | Status: DC | PRN

## 2018-07-11 MED ORDER — SODIUM CHLORIDE 0.9% FLUSH: 3.0000 mL | INTRAVENOUS | Status: DC | PRN

## 2018-07-11 MED ORDER — SODIUM CHLORIDE 0.9 % IV SOLN: 250.0000 mL | INTRAVENOUS | Status: DC | PRN

## 2018-07-11 MED ORDER — HEPARIN SODIUM (PORCINE) 1000 UNIT/ML IJ SOLN
INTRAMUSCULAR | Status: AC
  Filled 2018-07-11: qty 1

## 2018-07-11 MED ORDER — MIDAZOLAM HCL 2 MG/2ML IJ SOLN
INTRAMUSCULAR | Status: DC | PRN
  Administered 2018-07-11: 1 mg via INTRAVENOUS

## 2018-07-11 MED ORDER — FENTANYL CITRATE (PF) 100 MCG/2ML IJ SOLN
INTRAMUSCULAR | Status: DC | PRN
  Administered 2018-07-11: 25 ug via INTRAVENOUS

## 2018-07-11 MED ORDER — FENTANYL CITRATE (PF) 100 MCG/2ML IJ SOLN
INTRAMUSCULAR | Status: AC
  Filled 2018-07-11: qty 2

## 2018-07-11 MED ORDER — HEPARIN (PORCINE) IN NACL 1000-0.9 UT/500ML-% IV SOLN
INTRAVENOUS | Status: DC | PRN
  Administered 2018-07-11 (×2): 500 mL

## 2018-07-11 MED ORDER — MIDAZOLAM HCL 2 MG/2ML IJ SOLN
INTRAMUSCULAR | Status: AC
  Filled 2018-07-11: qty 2

## 2018-07-11 MED ORDER — LIDOCAINE HCL (PF) 1 % IJ SOLN
INTRAMUSCULAR | Status: AC
  Filled 2018-07-11: qty 30

## 2018-07-11 MED ORDER — ACETAMINOPHEN 325 MG PO TABS: 650.0000 mg | ORAL_TABLET | ORAL | Status: DC | PRN

## 2018-07-11 SURGICAL SUPPLY — 12 items
CATH 5FR JL3.5 JR4 ANG PIG MP (CATHETERS) ×2 IMPLANT
CATH BALLN WEDGE 5F 110CM (CATHETERS) ×2 IMPLANT
DEVICE RAD COMP TR BAND LRG (VASCULAR PRODUCTS) ×2 IMPLANT
GLIDESHEATH SLEND SS 6F .021 (SHEATH) ×2 IMPLANT
GUIDEWIRE INQWIRE 1.5J.035X260 (WIRE) ×1 IMPLANT
INQWIRE 1.5J .035X260CM (WIRE) ×2
KIT HEART LEFT (KITS) ×2 IMPLANT
PACK CARDIAC CATHETERIZATION (CUSTOM PROCEDURE TRAY) ×2 IMPLANT
SHEATH GLIDE SLENDER 4/5FR (SHEATH) ×2 IMPLANT
SYR MEDRAD MARK 7 150ML (SYRINGE) ×2 IMPLANT
TRANSDUCER W/STOPCOCK (MISCELLANEOUS) ×2 IMPLANT
TUBING CIL FLEX 10 FLL-RA (TUBING) IMPLANT

## 2018-07-11 NOTE — Interval H&P Note (Signed)
History and Physical Interval Note:  07/11/2018 11:44 AM  [name]  has presented today for surgery, with the diagnosis of pulmonary hypertension  The various methods of treatment have been discussed with the patient and family. After consideration of risks, benefits and other options for treatment, the patient has consented to  Procedure(s): RIGHT/LEFT HEART CATH AND CORONARY ANGIOGRAPHY (N/A)  and possible coronary angioplasty as a surgical intervention .  The patient's history has been reviewed, patient examined, no change in status, stable for surgery.  I have reviewed the patient's chart and labs.  Questions were answered to the patient's satisfaction.     Daniel [name]

## 2018-07-11 NOTE — Progress Notes (Signed)
Pt states she does not need and does not want a pregnancy test.  She states she has not had a period in 2 years and has had labs drawn at her OB office that proves she is post menopausal..

## 2018-07-11 NOTE — Discharge Instructions (Signed)
Radial Site Care ° °This sheet gives you information about how to care for yourself after your procedure. Your health care provider may also give you more specific instructions. If you have problems or questions, contact your health care provider. °What can I expect after the procedure? °After the procedure, it is common to have: °· Bruising and tenderness at the catheter insertion area. °Follow these instructions at home: °Medicines °· Take over-the-counter and prescription medicines only as told by your health care provider. °Insertion site care °· Follow instructions from your health care provider about how to take care of your insertion site. Make sure you: °? Wash your hands with soap and water before you change your bandage (dressing). If soap and water are not available, use hand sanitizer. °? Change your dressing as told by your health care provider. °? Leave stitches (sutures), skin glue, or adhesive strips in place. These skin closures may need to stay in place for 2 weeks or longer. If adhesive strip edges start to loosen and curl up, you may trim the loose edges. Do not remove adhesive strips completely unless your health care provider tells you to do that. °· Check your insertion site every day for signs of infection. Check for: °? Redness, swelling, or pain. °? Fluid or blood. °? Pus or a bad smell. °? Warmth. °· Do not take baths, swim, or use a hot tub until your health care provider approves. °· You may shower 24-48 hours after the procedure, or as directed by your health care provider. °? Remove the dressing and gently wash the site with plain soap and water. °? Pat the area dry with a clean towel. °? Do not rub the site. That could cause bleeding. °· Do not apply powder or lotion to the site. °Activity ° °· For 24 hours after the procedure, or as directed by your health care provider: °? Do not flex or bend the affected arm. °? Do not push or pull heavy objects with the affected arm. °? Do not  drive yourself home from the hospital or clinic. You may drive 24 hours after the procedure unless your health care provider tells you not to. °? Do not operate machinery or power tools. °· Do not lift anything that is heavier than 10 lb (4.5 kg), or the limit that you are told, until your health care provider says that it is safe. °· Ask your health care provider when it is okay to: °? Return to work or school. °? Resume usual physical activities or sports. °? Resume sexual activity. °General instructions °· If the catheter site starts to bleed, raise your arm and put firm pressure on the site. If the bleeding does not stop, get help right away. This is a medical emergency. °· If you went home on the same day as your procedure, a responsible adult should be with you for the first 24 hours after you arrive home. °· Keep all follow-up visits as told by your health care provider. This is important. °Contact a health care provider if: °· You have a fever. °· You have redness, swelling, or yellow drainage around your insertion site. °Get help right away if: °· You have unusual pain at the radial site. °· The catheter insertion area swells very fast. °· The insertion area is bleeding, and the bleeding does not stop when you hold steady pressure on the area. CALL 911 °· Your arm or hand becomes pale, cool, tingly, or numb. °These symptoms may represent a   serious problem that is an emergency. Do not wait to see if the symptoms will go away. Get medical help right away. Call your local emergency services (911 in the [place] Do not drive yourself to the hospital. °Summary °· After the procedure, it is common to have bruising and tenderness at the site. °· Follow instructions from your health care provider about how to take care of your radial site wound. Check the wound every day for signs of infection. °· Do not lift anything that is heavier than 10 lb (4.5 kg), or the limit that you are told, until your health care provider  says that it is safe. °This information is not intended to replace advice given to you by your health care provider. Make sure you discuss any questions you have with your health care provider. °Document Released: 08/04/2010 Document Revised: 08/07/2017 Document Reviewed: 08/07/2017 °Elsevier Interactive Patient Education © 2019 [employer] ° °

## 2018-07-14 ENCOUNTER — Encounter (HOSPITAL_COMMUNITY): Payer: Self-pay | Admitting: Internal Medicine

## 2018-07-17 MED FILL — CYCLOPHOSPHAMIDE 50 MG CAP: 50 | 28 days supply | Qty: 28 | Fill #0

## 2018-08-11 MED FILL — CYCLOPHOSPHAMIDE 50 MG CAP: 50 | 28 days supply | Qty: 28 | Fill #1

## 2018-08-13 ENCOUNTER — Encounter (HOSPITAL_COMMUNITY): Payer: Self-pay | Admitting: Internal Medicine

## 2018-08-13 ENCOUNTER — Other Ambulatory Visit: Payer: Self-pay

## 2018-08-13 ENCOUNTER — Ambulatory Visit (HOSPITAL_COMMUNITY)
Admission: RE | Admit: 2018-08-13 | Discharge: 2018-08-13 | Disposition: A | Discharge status: Home / Self Care | Payer: BC Managed Care – PPO | Source: Ambulatory Visit | Attending: Internal Medicine | Admitting: Internal Medicine

## 2018-08-13 VITALS — BP 122/86 | HR 76 | Wt 194.2 lb

## 2018-08-13 DIAGNOSIS — R7303 Prediabetes: Secondary | ICD-10-CM | POA: Diagnosis not present

## 2018-08-13 DIAGNOSIS — I272 Pulmonary hypertension, unspecified: Secondary | ICD-10-CM | POA: Insufficient documentation

## 2018-08-13 DIAGNOSIS — Z880 Allergy status to penicillin: Secondary | ICD-10-CM | POA: Diagnosis not present

## 2018-08-13 DIAGNOSIS — I10 Essential (primary) hypertension: Secondary | ICD-10-CM | POA: Diagnosis not present

## 2018-08-13 DIAGNOSIS — Z888 Allergy status to other drugs, medicaments and biological substances status: Secondary | ICD-10-CM | POA: Diagnosis not present

## 2018-08-13 DIAGNOSIS — Z8249 Family history of ischemic heart disease and other diseases of the circulatory system: Secondary | ICD-10-CM | POA: Diagnosis not present

## 2018-08-13 DIAGNOSIS — I739 Peripheral vascular disease, unspecified: Secondary | ICD-10-CM | POA: Insufficient documentation

## 2018-08-13 DIAGNOSIS — M3214 Glomerular disease in systemic lupus erythematosus: Secondary | ICD-10-CM | POA: Insufficient documentation

## 2018-08-13 DIAGNOSIS — Z79899 Other long term (current) drug therapy: Secondary | ICD-10-CM | POA: Diagnosis not present

## 2018-08-13 LAB — BASIC METABOLIC PANEL
Anion gap: 6 (ref 5–15)
BUN: 17 mg/dL (ref 6–20)
CO2: 24 mmol/L (ref 22–32)
Calcium: 8.1 mg/dL — ABNORMAL LOW (ref 8.9–10.3)
Chloride: 111 mmol/L (ref 98–111)
Creatinine, Ser: 0.93 mg/dL (ref 0.44–1.00)
GFR calc Af Amer: >60 mL/min (ref >60)
GFR calc non Af Amer: >60 mL/min (ref >60)
Glucose, Bld: 148 mg/dL — ABNORMAL HIGH (ref 70–99)
Potassium: 4.1 mmol/L (ref 3.5–5.1)
Sodium: 141 mmol/L (ref 135–145)

## 2018-08-13 LAB — BRAIN NATRIURETIC PEPTIDE: B Natriuretic Peptide: 306.2 pg/mL — ABNORMAL HIGH (ref 0.0–100.0)

## 2018-08-13 NOTE — Progress Notes (Signed)
Pulmonary Hypertension Clinic Note    Referring Physician: PCP: [name], MD PCP-Cardiologist: No primary care provider on file.   HPI:  [name] is a 48 y.o. 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   Seen by Dr. Martinique 06/16/18 with complaints of worsening LE edema since July. Worse towards the end of the day. She was started on lasix previously without much improvement. She has h/o the same due to her nephritis, but protein levels have been stable. She follows with Dr. Amil Amen with Rheumatology and Dr. Moshe Cipro with Nephrology. Sent for Echo as below which showed pulmonary HTN, likely due to her Lupus.   Echo 06/17/18 LVEF 65-70%, Mild MR, Severe RAE, Severe TR, Trivial PI, PA peak pressure 68 mm/Hg, trivial pericardial effusion.  We saw her for the first time in the pulmonary HTN clinic in 12/19. She subsequently underwent R/LHC in 12/19 which sowed normal coronary arteries with mild PAH 43/18 (26) but normal PVR and high output. Feels much better. Cough completely resolved with diuresis. Breathing better. Not SOB with ADLs or at work. Gets SOB if she goes up a lot of steps. Edema improved but not resolved. Taking furosemide 40 daily. Dr. Moshe Cipro had started her back on cytoxan for proteinuria.    Ao = 115/73 (92) LV = 116/12 RA = 8 RV = 39/10 PA = 43/18 (26) PCW = 13 Fick cardiac output/index = 8.7/4.5 PVR = 1.5 WU FA sat = 99% PA sat = 73%, 75% SVC sat = 79%  Assessment:  1. Normal coronary arteries with left dominant system 2. LVEF 60-65% 3. Very mild pulmonary HTN with normal PVR and high CO  Plan/Discussion:  PAH likely due to high output and not pulmonary arteriopathy. Consider echo with bubble study.    Past Medical History:  Diagnosis Date  . Avascular necrosis ([place])   . Hypertension   . Lupus ([place])   . Lupus nephritis ([place])   .  Prediabetes 10/2011  . TTP (thrombotic thrombopenic purpura) ([place]) 2007    Current Outpatient Medications  Medication Sig Dispense Refill  . acetaminophen (TYLENOL) 500 MG tablet Take 1,000 mg by mouth every 8 (eight) hours as needed (pain).    Marland Kitchen albuterol (VENTOLIN HFA) 108 (90 Base) MCG/ACT inhaler Inhale 2 puffs into the lungs every 4 (four) hours as needed for wheezing or shortness of breath.     . dextromethorphan (DELSYM) 30 MG/5ML liquid Take 60 mg by mouth 2 (two) times daily as needed for cough. 2 tsp every 12 hours as needed    . furosemide (LASIX) 40 MG tablet Take 1 tablet (40 mg total) by mouth daily. 90 tablet 3  . hydroxychloroquine (PLAQUENIL) 200 MG tablet Take 400 mg by mouth daily.     Marland Kitchen ibuprofen (ADVIL,MOTRIN) 200 MG tablet Take 400 mg by mouth every 8 (eight) hours as needed (pain).    Marland Kitchen losartan (COZAAR) 50 MG tablet Take 50 mg by mouth daily.    Marland Kitchen spironolactone (ALDACTONE) 25 MG tablet Take 0.5 tablets (12.5 mg total) by mouth daily. 15 tablet 6  . Vitamin D, Ergocalciferol, (DRISDOL) 1.25 MG (50000 UT) CAPS capsule Take 50,000 Units by mouth every 7 (seven) days. Sundays    . potassium chloride SA (K-DUR,KLOR-CON) 20 MEQ tablet Take 20 mEq by mouth daily.      No current facility-administered medications for this encounter.     Allergies  Allergen Reactions  .  Lisinopril     angioedema  . Penicillins Hives    DID THE REACTION INVOLVE: Swelling of the face/tongue/throat, SOB, or low BP? Unknown Sudden or severe rash/hives, skin peeling, or the inside of the mouth or nose? Unknown Did it require medical treatment? Unknown When did it last happen?age 57 If all above answers are "NO", may proceed with cephalosporin use.       Social History   Socioeconomic History  . Marital status: Married    Spouse name: Not on file  . Number of children: 2  . Years of education: Not on file  . Highest education level: Not on file  Occupational History  . Not on  file  Social Needs  . Financial resource strain: Not on file  . Food insecurity:    Worry: Not on file    Inability: Not on file  . Transportation needs:    Medical: Not on file    Non-medical: Not on file  Tobacco Use  . Smoking status: Never Smoker  . Smokeless tobacco: Never Used  Substance and Sexual Activity  . Alcohol use: No  . Drug use: No  . Sexual activity: Yes    Partners: Male    Birth control/protection: Pill, None    Comment: LoLoestrin Fe   Lifestyle  . Physical activity:    Days per week: Not on file    Minutes per session: Not on file  . Stress: Not on file  Relationships  . Social connections:    Talks on phone: Not on file    Gets together: Not on file    Attends religious service: Not on file    Active member of club or organization: Not on file    Attends meetings of clubs or organizations: Not on file    Relationship status: Not on file  . Intimate partner violence:    Fear of current or ex partner: Not on file    Emotionally abused: Not on file    Physically abused: Not on file    Forced sexual activity: Not on file  Other Topics Concern  . Not on file  Social History Narrative  . Not on file      Family History  Problem Relation Age of Onset  . Breast cancer Mother   . Heart disease Mother   . Breast cancer Maternal Grandmother   . Clotting disorder Father   . Hypertension Father   . Anemia Father    Vitals:   08/13/18 0945  BP: 122/86  Pulse: 76  SpO2: 97%  Weight: 88.1 kg (194 lb 3.2 oz)   PHYSICAL EXAM: General:  Well appearing. No resp difficulty HEENT: normal Neck: supple. no JVD. Carotids 2+ bilat; no bruits. No lymphadenopathy or thryomegaly appreciated. Cor: PMI nondisplaced. Regular rate & rhythm. Slightly increased P2 no RV lift. Lungs: clear Abdomen: soft, nontender, nondistended. No hepatosplenomegaly. No bruits or masses. Good bowel sounds. Extremities: no cyanosis, clubbing, rash, trace edema Neuro: alert &  orientedx3, cranial nerves grossly intact. moves all 4 extremities w/o difficulty. Affect pleasant   ECG: 06/16/18 showed sinus bradycardia 59 bpm, personally reviewed.   ASSESSMENT & PLAN:  1. Pulmonary HTN - Echo 06/17/18 LVEF 65-70%, Mild MR, Severe RAE, Severe TR, Trivial PI, PA peak pressure 68 mm/Hg, trivial pericardial effusion.  - Likely primarily WHO group 1 with Lupus, but cannot rule out component of group 2 and 3 with body habitus and HTN.  - Much improved with diuresis.  NYHA II-  early III symptoms.  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  - Continue lasix 80 mg daily and potassium 20 meq daily for now. Continue spiro. BMET and BNP today.  - Check labs - Low threshold to repeat RHC and consider trial of selective pulmonary artery vasodialtors  2. Lupus with history of Nephritis - Followed by Dr. Amil Amen and Dr. Moshe Cipro (Nephrology) - On Plaquenil per Dr. Amil Amen.  - Now on cytoxan with Dr. Moshe Cipro 3. HTN - Blood pressure well controlled. Continue current regimen.  Glori Bickers, MD 08/13/18

## 2018-08-13 NOTE — Patient Instructions (Signed)
Labs were done today. We will call you with any ABNORMAL results. No news is good news!  Your physician has requested that you have an echocardiogram. Echocardiography is a painless test that uses sound waves to create images of your heart. It provides your doctor with information about the size and shape of your heart and how well your heart's chambers and valves are working. This procedure takes approximately one hour. There are no restrictions for this procedure.  Your physician recommends that you schedule a follow-up appointment in 2 MONTHS

## 2018-08-22 ENCOUNTER — Ambulatory Visit (HOSPITAL_COMMUNITY)
Admission: RE | Admit: 2018-08-22 | Discharge: 2018-08-22 | Disposition: A | Discharge status: Home / Self Care | Payer: BC Managed Care – PPO | Source: Ambulatory Visit | Attending: Internal Medicine | Admitting: Internal Medicine

## 2018-08-22 DIAGNOSIS — I272 Pulmonary hypertension, unspecified: Secondary | ICD-10-CM

## 2018-08-22 NOTE — Progress Notes (Signed)
  Echocardiogram 2D Echocardiogram has been performed.  [name] 08/22/2018, 4:05 PM

## 2018-09-03 ENCOUNTER — Telehealth (HOSPITAL_COMMUNITY): Payer: Self-pay

## 2018-09-03 NOTE — Telephone Encounter (Signed)
Pt had previously called to received results of echo with bubble study. Spoke to patient today to relay results of echo with bubble study. Pt aware and verbalized understanding.

## 2018-10-16 ENCOUNTER — Encounter (HOSPITAL_COMMUNITY): Payer: BC Managed Care – PPO | Admitting: Internal Medicine

## 2018-12-24 ENCOUNTER — Other Ambulatory Visit: Payer: Self-pay

## 2018-12-24 ENCOUNTER — Ambulatory Visit (HOSPITAL_COMMUNITY)
Admission: RE | Admit: 2018-12-24 | Discharge: 2018-12-24 | Disposition: A | Discharge status: Home / Self Care | Payer: BC Managed Care – PPO | Source: Ambulatory Visit | Attending: Internal Medicine | Admitting: Internal Medicine

## 2018-12-24 DIAGNOSIS — I272 Pulmonary hypertension, unspecified: Secondary | ICD-10-CM

## 2018-12-24 DIAGNOSIS — I10 Essential (primary) hypertension: Secondary | ICD-10-CM

## 2018-12-24 DIAGNOSIS — M329 Systemic lupus erythematosus, unspecified: Secondary | ICD-10-CM

## 2018-12-24 NOTE — H&P (View-Only) (Signed)
Heart Failure TeleHealth Note  Due to national recommendations of social distancing due to [place] 19, Audio/video telehealth visit is felt to be most appropriate for this patient at this time.  See MyChart message from today for patient consent regarding telehealth for [facility].  Date:  12/24/2018   ID:  [name], DOB [birth year], MRN [id number]  Location: Home  Provider location: [facility] Type of Visit: Established patient  PCP:  [name], MD  Cardiologist:  No primary care provider on file. Primary HF: [name]  Chief Complaint: Heart Failure follow-up   History of Present Illness:  [name] is a 48 y.o. 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   Seen by Dr. Martinique 06/16/18 with complaints of worsening LE edema since July. Worse towards the end of the day. She was started on lasix previously without much improvement. She has h/o the same due to her nephritis, but protein levels have been stable. She follows with Dr. Amil Amen with Rheumatology and Dr. Moshe Cipro with Nephrology. Sent for Echo as below which showed pulmonary HTN, likely due to her Lupus.   Echo 06/17/18 LVEF 65-70%, Mild MR, Severe RAE, Severe TR, Trivial PI, PA peak pressure 68 mm/Hg, trivial pericardial effusion.  We saw her for the first time in the pulmonary HTN clinic in 12/19. She subsequently underwent R/LHC in 12/19 which sowed normal coronary arteries with mild PAH 43/18 (26) but normal PVR and high output.  Had echo 2/20. EF 55-60% RV mildly dilated. Function ok. Septum flat. Severe TR, RVSP measured at 72mmHG. Bubble negative  He presents via [employer] (consulting) for a telehealth visit today. Says she feels pretty good. BP has been labile. SBP averages 120 but has gone up to 167. Has lost 24 pounds since we have seen her. Weight 169 now. She  hasn't been trying. So it concerns her some. Says appetite isn't great. No edema, orthopnea or PND. Stopped lasix due to dizziness.Taking spiro. Sees Dr. Moshe Cipro. Stopped Cytoxan in March. Now plaquenil.   [place] 12/19  Ao = 115/73 (92) LV = 116/12 RA = 8 RV = 39/10 PA = 43/18 (26) PCW = 13 Fick cardiac output/index = 8.7/4.5 PVR = 1.5 WU FA sat = 99% PA sat = 73%, 75% SVC sat = 79%  Assessment:  1. Normal coronary arteries with left dominant system 2. LVEF 60-65% 3. Very mild pulmonary HTN with normal PVR and high CO  Plan/Discussion:  PAH likely due to high output and not pulmonary arteriopathy. Consider echo with bubble study.    [name] denies symptoms worrisome for COVID 19.   Past Medical History:  Diagnosis Date  . Avascular necrosis ([place])   . Hypertension   . Lupus ([place])   . Lupus nephritis ([place])   . Prediabetes 10/2011  . TTP (thrombotic thrombopenic purpura) ([place]) 2007   Past Surgical History:  Procedure Laterality Date  . bilat hip grafts     2007  . BUNIONECTOMY  2018  . HIP SURGERY     x2  . RIGHT/LEFT HEART CATH AND CORONARY ANGIOGRAPHY N/A 07/11/2018   Procedure: RIGHT/LEFT HEART CATH AND CORONARY ANGIOGRAPHY;  Surgeon: Jolaine Artist, MD;  Location: [facility];  Service: Cardiovascular;  Laterality: N/A;  . TOTAL HIP ARTHROPLASTY  08/2016 and 10/2016   x2  . UMBILICAL HERNIA REPAIR  Current Outpatient Medications  Medication Sig Dispense Refill  . acetaminophen (TYLENOL) 500 MG tablet Take 1,000 mg by mouth every 8 (eight) hours as needed (pain).    Marland Kitchen albuterol (VENTOLIN HFA) 108 (90 Base) MCG/ACT inhaler Inhale 2 puffs into the lungs every 4 (four) hours as needed for wheezing or shortness of breath.     . dextromethorphan (DELSYM) 30 MG/5ML liquid Take 60 mg by mouth 2 (two) times daily as needed for cough. 2 tsp every 12 hours as needed    . furosemide (LASIX) 40 MG tablet Take 1 tablet (40 mg total) by  mouth daily. 90 tablet 3  . hydroxychloroquine (PLAQUENIL) 200 MG tablet Take 400 mg by mouth daily.     Marland Kitchen ibuprofen (ADVIL,MOTRIN) 200 MG tablet Take 400 mg by mouth every 8 (eight) hours as needed (pain).    Marland Kitchen losartan (COZAAR) 50 MG tablet Take 50 mg by mouth daily.    . potassium chloride SA (K-DUR,KLOR-CON) 20 MEQ tablet Take 20 mEq by mouth daily.     Marland Kitchen spironolactone (ALDACTONE) 25 MG tablet Take 0.5 tablets (12.5 mg total) by mouth daily. 15 tablet 6  . Vitamin D, Ergocalciferol, (DRISDOL) 1.25 MG (50000 UT) CAPS capsule Take 50,000 Units by mouth every 7 (seven) days. Sundays     No current facility-administered medications for this encounter.     Allergies:   Lisinopril and Penicillins   Social History:  The patient  reports that she has never smoked. She has never used smokeless tobacco. She reports that she does not drink alcohol or use drugs.   Family History:  The patient's family history includes Anemia in her father; Breast cancer in her maternal grandmother and mother; Clotting disorder in her father; Heart disease in her mother; Hypertension in her father.   ROS:  Please see the history of present illness.   All other systems are personally reviewed and negative.   Exam:  (Video/Tele Health Call; Exam is subjective and or/visual.) General:  Speaks in full sentences. No resp difficulty. Lungs: Normal respiratory effort with conversation.  Abdomen: Non-distended per patient report Extremities: Pt denies edema. Neuro: Alert & oriented x 3.   Recent Labs: 04/03/2018: ALT 17 07/03/2018: Hemoglobin 8.8; Platelets 245 08/13/2018: B Natriuretic Peptide 306.2; BUN 17; Creatinine, Ser 0.93; Potassium 4.1; Sodium 141  Personally reviewed   Wt Readings from Last 3 Encounters:  08/13/18 88.1 kg (194 lb 3.2 oz)  07/11/18 85.3 kg (188 lb)  07/03/18 91.8 kg (202 lb 6.4 oz)      ASSESSMENT AND PLAN:  1. Pulmonary HTN - Echo 06/17/18 LVEF 65-70%, Mild MR, Severe RAE, Severe TR,  Trivial PI, PA peak pressure 68 mm/Hg, trivial pericardial effusion.  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 However repeat echo in 2/20 with dilated RV, severe TR and septal flattening. Given high suspicion for WHO Group I PAH will need repeat RHC and PFTs - Bubble study negative   2. Lupus with history of Nephritis - Followed by Dr. Amil Amen and Dr. Moshe Cipro (Nephrology) - On Plaquenil per Dr. Amil Amen.  - Now off cytoxan   3. HTN - Labile but overall blood pressure well controlled. Continue current regimen.   COVID screen The patient does not have any symptoms that suggest any further testing/ screening at this time.  Social distancing reinforced today.  Recommended follow-up:  As  above  Relevant cardiac medications were reviewed at length with the patient today.   The patient does not have concerns regarding their medications at this time.   The following changes were made today:  As above  Today, I have spent 17 minutes with the patient with telehealth technology discussing the above issues .    Signed, Glori Bickers, MD  12/24/2018 2:56 PM  [facility] [place] [address] Heart and [place] [address] [phone] (office) [phone] (fax)

## 2018-12-24 NOTE — Progress Notes (Signed)
Heart Failure TeleHealth Note  Due to national recommendations of social distancing due to [place] 19, Audio/video telehealth visit is felt to be most appropriate for this patient at this time.  See MyChart message from today for patient consent regarding telehealth for [facility].  Date:  12/24/2018   ID:  [name], DOB Mar 24, 1971, MRN [id number]  Location: Home  Provider location: [facility] Type of Visit: Established patient  PCP:  [name], MD  Cardiologist:  No primary care provider on file. Primary HF: [place]  Chief Complaint: Heart Failure follow-up   History of Present Illness:  [name] is a 48 y.o. 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   Seen by Dr. Martinique 06/16/18 with complaints of worsening LE edema since July. Worse towards the end of the day. She was started on lasix previously without much improvement. She has h/o the same due to her nephritis, but protein levels have been stable. She follows with Dr. Amil Amen with Rheumatology and Dr. Moshe Cipro with Nephrology. Sent for Echo as below which showed pulmonary HTN, likely due to her Lupus.   Echo 06/17/18 LVEF 65-70%, Mild MR, Severe RAE, Severe TR, Trivial PI, PA peak pressure 68 mm/Hg, trivial pericardial effusion.  We saw her for the first time in the pulmonary HTN clinic in 12/19. She subsequently underwent R/LHC in 12/19 which sowed normal coronary arteries with mild PAH 43/18 (26) but normal PVR and high output.  Had echo 2/20. EF 55-60% RV mildly dilated. Function ok. Septum flat. Severe TR, RVSP measured at 69mmHG. Bubble negative  He presents via Engineer, civil (consulting) for a telehealth visit today. Says she feels pretty good. BP has been labile. SBP averages 120 but has gone up to 167. Has lost 24 pounds since we have seen her. Weight 169 now. She  hasn't been trying. So it concerns her some. Says appetite isn't great. No edema, orthopnea or PND. Stopped lasix due to dizziness.Taking spiro. Sees Dr. Moshe Cipro. Stopped Cytoxan in March. Now plaquenil.   [place] 12/19  Ao = 115/73 (92) LV = 116/12 RA = 8 RV = 39/10 PA = 43/18 (26) PCW = 13 Fick cardiac output/index = 8.7/4.5 PVR = 1.5 WU FA sat = 99% PA sat = 73%, 75% SVC sat = 79%  Assessment:  1. Normal coronary arteries with left dominant system 2. LVEF 60-65% 3. Very mild pulmonary HTN with normal PVR and high CO  Plan/Discussion:  PAH likely due to high output and not pulmonary arteriopathy. Consider echo with bubble study.    CERIAH KOHLER denies symptoms worrisome for COVID 19.   Past Medical History:  Diagnosis Date  . Avascular necrosis ([place])   . Hypertension   . Lupus ([place])   . Lupus nephritis ([place])   . Prediabetes 10/2011  . TTP (thrombotic thrombopenic purpura) ([place]) 2007   Past Surgical History:  Procedure Laterality Date  . bilat hip grafts     2007  . BUNIONECTOMY  2018  . HIP SURGERY     x2  . RIGHT/LEFT HEART CATH AND CORONARY ANGIOGRAPHY N/A 07/11/2018   Procedure: RIGHT/LEFT HEART CATH AND CORONARY ANGIOGRAPHY;  Surgeon: Jolaine Artist, MD;  Location: [facility];  Service: Cardiovascular;  Laterality: N/A;  . TOTAL HIP ARTHROPLASTY  08/2016 and 10/2016   x2  . UMBILICAL HERNIA REPAIR  Current Outpatient Medications  Medication Sig Dispense Refill  . acetaminophen (TYLENOL) 500 MG tablet Take 1,000 mg by mouth every 8 (eight) hours as needed (pain).    Marland Kitchen albuterol (VENTOLIN HFA) 108 (90 Base) MCG/ACT inhaler Inhale 2 puffs into the lungs every 4 (four) hours as needed for wheezing or shortness of breath.     . dextromethorphan (DELSYM) 30 MG/5ML liquid Take 60 mg by mouth 2 (two) times daily as needed for cough. 2 tsp every 12 hours as needed    . furosemide (LASIX) 40 MG tablet Take 1 tablet (40 mg total) by  mouth daily. 90 tablet 3  . hydroxychloroquine (PLAQUENIL) 200 MG tablet Take 400 mg by mouth daily.     Marland Kitchen ibuprofen (ADVIL,MOTRIN) 200 MG tablet Take 400 mg by mouth every 8 (eight) hours as needed (pain).    Marland Kitchen losartan (COZAAR) 50 MG tablet Take 50 mg by mouth daily.    . potassium chloride SA (K-DUR,KLOR-CON) 20 MEQ tablet Take 20 mEq by mouth daily.     Marland Kitchen spironolactone (ALDACTONE) 25 MG tablet Take 0.5 tablets (12.5 mg total) by mouth daily. 15 tablet 6  . Vitamin D, Ergocalciferol, (DRISDOL) 1.25 MG (50000 UT) CAPS capsule Take 50,000 Units by mouth every 7 (seven) days. Sundays     No current facility-administered medications for this encounter.     Allergies:   Lisinopril and Penicillins   Social History:  The patient  reports that she has never smoked. She has never used smokeless tobacco. She reports that she does not drink alcohol or use drugs.   Family History:  The patient's family history includes Anemia in her father; Breast cancer in her maternal grandmother and mother; Clotting disorder in her father; Heart disease in her mother; Hypertension in her father.   ROS:  Please see the history of present illness.   All other systems are personally reviewed and negative.   Exam:  (Video/Tele Health Call; Exam is subjective and or/visual.) General:  Speaks in full sentences. No resp difficulty. Lungs: Normal respiratory effort with conversation.  Abdomen: Non-distended per patient report Extremities: Pt denies edema. Neuro: Alert & oriented x 3.   Recent Labs: 04/03/2018: ALT 17 07/03/2018: Hemoglobin 8.8; Platelets 245 08/13/2018: B Natriuretic Peptide 306.2; BUN 17; Creatinine, Ser 0.93; Potassium 4.1; Sodium 141  Personally reviewed   Wt Readings from Last 3 Encounters:  08/13/18 88.1 kg (194 lb 3.2 oz)  07/11/18 85.3 kg (188 lb)  07/03/18 91.8 kg (202 lb 6.4 oz)      ASSESSMENT AND PLAN:  1. Pulmonary HTN - Echo 06/17/18 LVEF 65-70%, Mild MR, Severe RAE, Severe TR,  Trivial PI, PA peak pressure 68 mm/Hg, trivial pericardial effusion.  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 However repeat echo in 2/20 with dilated RV, severe TR and septal flattening. Given high suspicion for WHO Group I PAH will need repeat RHC and PFTs - Bubble study negative   2. Lupus with history of Nephritis - Followed by Dr. Amil Amen and Dr. Moshe Cipro (Nephrology) - On Plaquenil per Dr. Amil Amen.  - Now off cytoxan   3. HTN - Labile but overall blood pressure well controlled. Continue current regimen.   COVID screen The patient does not have any symptoms that suggest any further testing/ screening at this time.  Social distancing reinforced today.  Recommended follow-up:  As  above  Relevant cardiac medications were reviewed at length with the patient today.   The patient does not have concerns regarding their medications at this time.   The following changes were made today:  As above  Today, I have spent 17 minutes with the patient with telehealth technology discussing the above issues .    Signed, Glori Bickers, MD  12/24/2018 2:56 PM  [facility] [address] Heart and [place] [address] [phone] (office) [phone] (fax)

## 2018-12-25 ENCOUNTER — Encounter (HOSPITAL_COMMUNITY): Payer: Self-pay | Admitting: *Deleted

## 2018-12-25 ENCOUNTER — Other Ambulatory Visit (HOSPITAL_COMMUNITY): Payer: Self-pay | Admitting: *Deleted

## 2018-12-25 ENCOUNTER — Telehealth (HOSPITAL_COMMUNITY): Payer: BC Managed Care – PPO | Admitting: Internal Medicine

## 2018-12-25 DIAGNOSIS — I272 Pulmonary hypertension, unspecified: Secondary | ICD-10-CM

## 2018-12-25 MED ORDER — SODIUM CHLORIDE 0.9% FLUSH: 3.0000 mL | Freq: Two times a day (BID) | INTRAVENOUS | Status: DC

## 2018-12-25 NOTE — Progress Notes (Signed)
Spoke w/pt and she is aware and agreeable.  [place] sch for Fri 6/19 at 9 am, she will come to our office Tue 6/16 at 9 am for labs and then will go to [facility] for Covid testing.  We discussed all pre-cath instructions, covid testing and quarantine after the test.  Pt is aware and verbalized understanding of everything, she will p/u instruction letter when she comes for labs.

## 2018-12-25 NOTE — Patient Instructions (Signed)
Heart Cath is scheduled for Friday 6/19, see attached instructions  Your physician has recommended that you have a pulmonary function test. Pulmonary Function Tests are a group of tests that measure how well air moves in and out of your lungs.  The hospital is only performing these for patients you are scheduled for a surgery at this time, once they open up to scheduling we will call you schedule this.  Your physician recommends that you schedule a follow-up appointment in 6-8 weeks,our office will call you to schedule this  If you have any questions or concerns before your next appointment please send [place] a message through [place] or call our office at [phone].

## 2018-12-25 NOTE — Progress Notes (Signed)
Orders placed  per Dr Haroldine Laws: 1. RHC next week  2. PFTs with [facility]

## 2018-12-25 NOTE — Addendum Note (Signed)
Encounter addended by: Scarlette Calico, RN on: 12/25/2018 11:13 AM  Actions taken: Order list changed, Diagnosis association updated, Clinical Note Signed

## 2018-12-26 ENCOUNTER — Encounter (HOSPITAL_COMMUNITY): Payer: BC Managed Care – PPO | Admitting: Internal Medicine

## 2018-12-30 ENCOUNTER — Other Ambulatory Visit: Payer: Self-pay

## 2018-12-30 ENCOUNTER — Other Ambulatory Visit (HOSPITAL_COMMUNITY): Payer: Self-pay

## 2018-12-30 ENCOUNTER — Ambulatory Visit (HOSPITAL_COMMUNITY)
Admission: RE | Admit: 2018-12-30 | Discharge: 2018-12-30 | Disposition: A | Discharge status: Home / Self Care | Payer: BC Managed Care – PPO | Source: Ambulatory Visit | Attending: Internal Medicine | Admitting: Internal Medicine

## 2018-12-30 ENCOUNTER — Other Ambulatory Visit (HOSPITAL_COMMUNITY)
Admission: RE | Admit: 2018-12-30 | Discharge: 2018-12-30 | Disposition: A | Discharge status: Home / Self Care | Payer: BC Managed Care – PPO | Source: Ambulatory Visit | Attending: Internal Medicine | Admitting: Internal Medicine

## 2018-12-30 DIAGNOSIS — I272 Pulmonary hypertension, unspecified: Secondary | ICD-10-CM

## 2018-12-30 DIAGNOSIS — Z1159 Encounter for screening for other viral diseases: Secondary | ICD-10-CM | POA: Diagnosis not present

## 2018-12-30 DIAGNOSIS — Z01812 Encounter for preprocedural laboratory examination: Secondary | ICD-10-CM | POA: Diagnosis not present

## 2018-12-30 LAB — CBC
HCT: 28.5 % — ABNORMAL LOW (ref 36.0–46.0)
Hemoglobin: 9.2 g/dL — ABNORMAL LOW (ref 12.0–15.0)
MCH: 28.8 pg (ref 26.0–34.0)
MCHC: 32.3 g/dL (ref 30.0–36.0)
MCV: 89.3 fL (ref 80.0–100.0)
Platelets: 104 10*3/uL — ABNORMAL LOW (ref 150–400)
RBC: 3.19 MIL/uL — ABNORMAL LOW (ref 3.87–5.11)
RDW: 15.7 % — ABNORMAL HIGH (ref 11.5–15.5)
WBC: 2.6 10*3/uL — ABNORMAL LOW (ref 4.0–10.5)
nRBC: 0 % (ref 0.0–0.2)

## 2018-12-30 LAB — BASIC METABOLIC PANEL
Anion gap: 4 — ABNORMAL LOW (ref 5–15)
BUN: 15 mg/dL (ref 6–20)
CO2: 23 mmol/L (ref 22–32)
Calcium: 8 mg/dL — ABNORMAL LOW (ref 8.9–10.3)
Chloride: 115 mmol/L — ABNORMAL HIGH (ref 98–111)
Creatinine, Ser: 1.04 mg/dL — ABNORMAL HIGH (ref 0.44–1.00)
GFR calc Af Amer: >60 mL/min (ref >60)
GFR calc non Af Amer: >60 mL/min (ref >60)
Glucose, Bld: 184 mg/dL — ABNORMAL HIGH (ref 70–99)
Potassium: 3.8 mmol/L (ref 3.5–5.1)
Sodium: 142 mmol/L (ref 135–145)

## 2018-12-30 LAB — PROTIME-INR
INR: 1.1 (ref 0.8–1.2)
Prothrombin Time: 14.1 seconds (ref 11.4–15.2)

## 2018-12-31 LAB — NOVEL CORONAVIRUS, NAA (HOSP ORDER, SEND-OUT TO REF LAB; TAT 18-24 HRS): SARS-CoV-2, NAA: NOT DETECTED

## 2019-01-02 ENCOUNTER — Other Ambulatory Visit: Payer: Self-pay

## 2019-01-02 ENCOUNTER — Ambulatory Visit (HOSPITAL_COMMUNITY)
Admission: RE | Admit: 2019-01-02 | Discharge: 2019-01-02 | Disposition: A | Discharge status: Home / Self Care | Payer: BC Managed Care – PPO | Attending: Internal Medicine | Admitting: Internal Medicine

## 2019-01-02 ENCOUNTER — Encounter (HOSPITAL_COMMUNITY)
Admission: RE | Disposition: A | Discharge status: Home / Self Care | Payer: Self-pay | Source: Home / Self Care | Attending: Internal Medicine

## 2019-01-02 ENCOUNTER — Encounter (HOSPITAL_COMMUNITY): Payer: Self-pay | Admitting: Internal Medicine

## 2019-01-02 DIAGNOSIS — Z96643 Presence of artificial hip joint, bilateral: Secondary | ICD-10-CM | POA: Insufficient documentation

## 2019-01-02 DIAGNOSIS — Z888 Allergy status to other drugs, medicaments and biological substances status: Secondary | ICD-10-CM | POA: Insufficient documentation

## 2019-01-02 DIAGNOSIS — Z8249 Family history of ischemic heart disease and other diseases of the circulatory system: Secondary | ICD-10-CM | POA: Diagnosis not present

## 2019-01-02 DIAGNOSIS — R7303 Prediabetes: Secondary | ICD-10-CM | POA: Diagnosis not present

## 2019-01-02 DIAGNOSIS — Z880 Allergy status to penicillin: Secondary | ICD-10-CM | POA: Diagnosis not present

## 2019-01-02 DIAGNOSIS — I2721 Secondary pulmonary arterial hypertension: Secondary | ICD-10-CM | POA: Diagnosis not present

## 2019-01-02 DIAGNOSIS — M3214 Glomerular disease in systemic lupus erythematosus: Secondary | ICD-10-CM | POA: Diagnosis not present

## 2019-01-02 DIAGNOSIS — I10 Essential (primary) hypertension: Secondary | ICD-10-CM | POA: Diagnosis not present

## 2019-01-02 DIAGNOSIS — I272 Pulmonary hypertension, unspecified: Secondary | ICD-10-CM

## 2019-01-02 DIAGNOSIS — Z79899 Other long term (current) drug therapy: Secondary | ICD-10-CM | POA: Diagnosis not present

## 2019-01-02 DIAGNOSIS — R600 Localized edema: Secondary | ICD-10-CM | POA: Diagnosis not present

## 2019-01-02 HISTORY — PX: RIGHT HEART CATH: CATH118263

## 2019-01-02 LAB — POCT I-STAT EG7
Acid-base deficit: 4 mmol/L — ABNORMAL HIGH (ref 0.0–2.0)
Acid-base deficit: 4 mmol/L — ABNORMAL HIGH (ref 0.0–2.0)
Acid-base deficit: 6 mmol/L — ABNORMAL HIGH (ref 0.0–2.0)
Bicarbonate: 20.1 mmol/L (ref 20.0–28.0)
Bicarbonate: 21.7 mmol/L (ref 20.0–28.0)
Bicarbonate: 21.8 mmol/L (ref 20.0–28.0)
Calcium, Ion: 1.11 mmol/L — ABNORMAL LOW (ref 1.15–1.40)
Calcium, Ion: 1.22 mmol/L (ref 1.15–1.40)
Calcium, Ion: 1.27 mmol/L (ref 1.15–1.40)
HCT: 23 % — ABNORMAL LOW (ref 36.0–46.0)
HCT: 23 % — ABNORMAL LOW (ref 36.0–46.0)
HCT: 24 % — ABNORMAL LOW (ref 36.0–46.0)
Hemoglobin: 7.8 g/dL — ABNORMAL LOW (ref 12.0–15.0)
Hemoglobin: 7.8 g/dL — ABNORMAL LOW (ref 12.0–15.0)
Hemoglobin: 8.2 g/dL — ABNORMAL LOW (ref 12.0–15.0)
O2 Saturation: 66 %
O2 Saturation: 71 %
O2 Saturation: 73 %
Potassium: 3.4 mmol/L — ABNORMAL LOW (ref 3.5–5.1)
Potassium: 3.6 mmol/L (ref 3.5–5.1)
Potassium: 3.8 mmol/L (ref 3.5–5.1)
Sodium: 146 mmol/L — ABNORMAL HIGH (ref 135–145)
Sodium: 147 mmol/L — ABNORMAL HIGH (ref 135–145)
Sodium: 149 mmol/L — ABNORMAL HIGH (ref 135–145)
TCO2: 21 mmol/L — ABNORMAL LOW (ref 22–32)
TCO2: 23 mmol/L (ref 22–32)
TCO2: 23 mmol/L (ref 22–32)
pCO2, Ven: 39.3 mmHg — ABNORMAL LOW (ref 44.0–60.0)
pCO2, Ven: 41 mmHg — ABNORMAL LOW (ref 44.0–60.0)
pCO2, Ven: 44.7 mmHg (ref 44.0–60.0)
pH, Ven: 7.296 (ref 7.250–7.430)
pH, Ven: 7.317 (ref 7.250–7.430)
pH, Ven: 7.331 (ref 7.250–7.430)
pO2, Ven: 38 mmHg (ref 32.0–45.0)
pO2, Ven: 40 mmHg (ref 32.0–45.0)
pO2, Ven: 41 mmHg (ref 32.0–45.0)

## 2019-01-02 LAB — GLUCOSE, CAPILLARY
Glucose-Capillary: 94 mg/dL (ref 70–99)
Glucose-Capillary: 34 mg/dL — CL (ref 70–99)
Glucose-Capillary: 122 mg/dL — ABNORMAL HIGH (ref 70–99)
Glucose-Capillary: 51 mg/dL — ABNORMAL LOW (ref 70–99)
Glucose-Capillary: 71 mg/dL (ref 70–99)

## 2019-01-02 SURGERY — RIGHT HEART CATH
Anesthesia: LOCAL

## 2019-01-02 MED ORDER — FENTANYL CITRATE (PF) 100 MCG/2ML IJ SOLN
INTRAMUSCULAR | Status: DC | PRN
  Administered 2019-01-02: 25 ug via INTRAVENOUS

## 2019-01-02 MED ORDER — HEPARIN (PORCINE) IN NACL 1000-0.9 UT/500ML-% IV SOLN
INTRAVENOUS | Status: DC | PRN
  Administered 2019-01-02: 500 mL

## 2019-01-02 MED ORDER — ASPIRIN 81 MG PO CHEW
81.0000 mg | CHEWABLE_TABLET | ORAL | Status: AC
  Administered 2019-01-02: 81 mg via ORAL
  Filled 2019-01-02: qty 1

## 2019-01-02 MED ORDER — HEPARIN (PORCINE) IN NACL 2000-0.9 UNIT/L-% IV SOLN
INTRAVENOUS | Status: AC
  Filled 2019-01-02: qty 1000

## 2019-01-02 MED ORDER — SODIUM CHLORIDE 0.9 % IV SOLN: 250.0000 mL | INTRAVENOUS | Status: DC | PRN

## 2019-01-02 MED ORDER — MIDAZOLAM HCL 2 MG/2ML IJ SOLN
INTRAMUSCULAR | Status: DC | PRN
  Administered 2019-01-02: 1 mg via INTRAVENOUS

## 2019-01-02 MED ORDER — SODIUM CHLORIDE 0.9 % IV SOLN: INTRAVENOUS | Status: DC

## 2019-01-02 MED ORDER — MIDAZOLAM HCL 2 MG/2ML IJ SOLN
INTRAMUSCULAR | Status: AC
  Filled 2019-01-02: qty 2

## 2019-01-02 MED ORDER — FENTANYL CITRATE (PF) 100 MCG/2ML IJ SOLN
INTRAMUSCULAR | Status: AC
  Filled 2019-01-02: qty 2

## 2019-01-02 MED ORDER — LIDOCAINE HCL (PF) 1 % IJ SOLN
INTRAMUSCULAR | Status: AC
  Filled 2019-01-02: qty 30

## 2019-01-02 MED ORDER — SODIUM CHLORIDE 0.9% FLUSH: 3.0000 mL | INTRAVENOUS | Status: DC | PRN

## 2019-01-02 MED ORDER — LIDOCAINE HCL (PF) 1 % IJ SOLN
INTRAMUSCULAR | Status: DC | PRN
  Administered 2019-01-02: 5 mL

## 2019-01-02 SURGICAL SUPPLY — 6 items
BAG SNAP BAND KOVER 36X36 (MISCELLANEOUS) ×2 IMPLANT
CATH BALLN WEDGE 5F 110CM (CATHETERS) ×4 IMPLANT
COVER DOME SNAP 22 D (MISCELLANEOUS) ×2 IMPLANT
PACK CARDIAC CATHETERIZATION (CUSTOM PROCEDURE TRAY) ×2 IMPLANT
SHEATH GLIDE SLENDER 4/5FR (SHEATH) ×2 IMPLANT
TRANSDUCER W/STOPCOCK (MISCELLANEOUS) ×2 IMPLANT

## 2019-01-02 NOTE — Research (Signed)
[place] Informed Consent   Subject Name: [name]  Subject met inclusion and exclusion criteria.  The informed consent form, study requirements and expectations were reviewed with the subject and questions and concerns were addressed prior to the signing of the consent form.  The subject verbalized understanding of the trail requirements.  The subject agreed to participate in the [facility] trial and signed the informed consent.  The informed consent was obtained prior to performance of any protocol-specific procedures for the subject.  A copy of the signed informed consent was given to the subject and a copy was placed in the subject's medical record.  Philemon Kingdom D 01/02/2019, [id number]

## 2019-01-02 NOTE — Interval H&P Note (Signed)
History and Physical Interval Note:  01/02/2019 9:34 AM  [name]  has presented today for surgery, with the diagnosis of heart failure.  The various methods of treatment have been discussed with the patient and family. After consideration of risks, benefits and other options for treatment, the patient has consented to  Procedure(s): RIGHT HEART CATH (N/A) as a surgical intervention.  The patient's history has been reviewed, patient examined, no change in status, stable for surgery.  I have reviewed the patient's chart and labs.  Questions were answered to the patient's satisfaction.     Cornesha Radziewicz

## 2019-01-02 NOTE — Discharge Instructions (Signed)
Moderate Conscious Sedation, Adult, Care After These instructions provide you with information about caring for yourself after your procedure. Your health care provider may also give you more specific instructions. Your treatment has been planned according to current medical practices, but problems sometimes occur. Call your health care provider if you have any problems or questions after your procedure. What can I expect after the procedure? After your procedure, it is common:  To feel sleepy for several hours.  To feel clumsy and have poor balance for several hours.  To have poor judgment for several hours.  To vomit if you eat too soon. Follow these instructions at home: For at least 24 hours after the procedure:   Do not: ? Participate in activities where you could fall or become injured. ? Drive. ? Use heavy machinery. ? Drink alcohol. ? Take sleeping pills or medicines that cause drowsiness. ? Make important decisions or sign legal documents. ? Take care of children on your own.  Rest. Eating and drinking  Follow the diet recommended by your health care provider.  If you vomit: ? Drink water, juice, or soup when you can drink without vomiting. ? Make sure you have little or no nausea before eating solid foods. General instructions  Have a responsible adult stay with you until you are awake and alert.  Take over-the-counter and prescription medicines only as told by your health care provider.  If you smoke, do not smoke without supervision.  Keep all follow-up visits as told by your health care provider. This is important. Contact a health care provider if:  You keep feeling nauseous or you keep vomiting.  You feel light-headed.  You develop a rash.  You have a fever. Get help right away if:  You have trouble breathing. This information is not intended to replace advice given to you by your health care provider. Make sure you discuss any questions you have  with your health care provider. Document Released: 04/22/2013 Document Revised: 12/05/2015 Document Reviewed: 10/22/2015 Elsevier Interactive Patient Education  2019 [employer] After This sheet gives you information about how to care for yourself after your procedure. Your health care provider may also give you more specific instructions. If you have problems or questions, contact your health care provider. What can I expect after the procedure? After the procedure, it is common to have:  Bruising or mild discomfort in the area where the IV was inserted (insertion site). Follow these instructions at home: Eating and drinking   Follow instructions from your health care provider about eating or drinking restrictions.  Drink a lot of fluids for the first several days after the procedure, as directed by your health care provider. This helps to wash (flush) the contrast out of your body. Examples of healthy fluids include water or low-calorie drinks. General instructions  Check your IV insertion area every day for signs of infection. Check for: ? Redness, swelling, or pain. ? Fluid or blood. ? Warmth. ? Pus or a bad smell.  Take over-the-counter and prescription medicines only as told by your health care provider.  Rest and return to your normal activities as told by your health care provider. Ask your health care provider what activities are safe for you.  Do not drive for 24 hours if you were given a medicine to help you relax (sedative), or until your health care provider approves.  Keep all follow-up visits as told by your health care provider. This is important. Contact a health  care provider if:  Your skin becomes itchy or you develop a rash or hives.  You have a fever that does not get better with medicine.  You feel nauseous.  You vomit.  You have redness, swelling, or pain around the insertion site.  You have fluid or blood coming from the insertion  site.  Your insertion area feels warm to the touch.  You have pus or a bad smell coming from the insertion site. Get help right away if:  You have difficulty breathing or shortness of breath.  You develop chest pain.  You faint.  You feel very dizzy. These symptoms may represent a serious problem that is an emergency. Do not wait to see if the symptoms will go away. Get medical help right away. Call your local emergency services (911 in the [place] Do not drive yourself to the hospital. Summary  After your procedure, it is common to have bruising or mild discomfort in the area where the IV was inserted.  You should check your IV insertion area every day for signs of infection.  Take over-the-counter and prescription medicines only as told by your health care provider.  You should drink a lot of fluids for the first several days after the procedure to help flush the contrast from your body. This information is not intended to replace advice given to you by your health care provider. Make sure you discuss any questions you have with your health care provider. Document Released: 04/22/2013 Document Revised: 05/26/2016 Document Reviewed: 05/26/2016 Elsevier Interactive Patient Education  2019 [employer].

## 2019-01-14 DIAGNOSIS — E119 Type 2 diabetes mellitus without complications: Secondary | ICD-10-CM

## 2019-01-14 DIAGNOSIS — E039 Hypothyroidism, unspecified: Secondary | ICD-10-CM

## 2019-01-14 HISTORY — DX: Hypothyroidism, unspecified: E03.9

## 2019-01-14 HISTORY — DX: Type 2 diabetes mellitus without complications: E11.9

## 2019-01-22 ENCOUNTER — Encounter

## 2019-01-23 ENCOUNTER — Telehealth (HOSPITAL_COMMUNITY): Payer: Self-pay | Admitting: *Deleted

## 2019-01-23 NOTE — Telephone Encounter (Signed)
Pt is sch for her pft's on 7/21 and needs her covid test done prior.  Attempted to call pt to sch it for Fri 7/17 and Left message to call back

## 2019-01-28 ENCOUNTER — Other Ambulatory Visit: Payer: Self-pay

## 2019-01-28 ENCOUNTER — Encounter: Payer: Self-pay | Admitting: Registered"

## 2019-01-28 ENCOUNTER — Encounter: Payer: BC Managed Care – PPO | Attending: Family Medicine | Admitting: Registered"

## 2019-01-28 DIAGNOSIS — E119 Type 2 diabetes mellitus without complications: Secondary | ICD-10-CM | POA: Insufficient documentation

## 2019-01-28 NOTE — Progress Notes (Signed)
Diabetes Self-Management Education  Visit Type: First/Initial  Appt. Start Time: 1038 Appt. End Time: 1230  01/28/2019  [name], identified by name and date of birth, is a 48 y.o. female with a diagnosis of Diabetes: Type 2.   ASSESSMENT  Weight 172 lb 3.2 oz (78.1 kg), last menstrual period 04/02/2017. There is no height or weight on file to calculate BMI.   Pt works for [employer]. Pt has been dx with lupus for 18 years. Reports she sometimes struggles to remember to take all of her medications. Reports she tries to keep medications with her to help remind her. Reports she has a pill box to help her remember to take medications but has not yet filled it.   Pt reports her husband tries to help her with eating better. Reports the nutrition component is the hardest for her to manage. Reports she often does not care about eating and is a picky eater. Reports eating has been a struggle for her since she was a child. Reports she eats 1-2 meals per day, very rare that she eats 3. Dinner is sometimes late. Reports she does not want to eat when she gets stressed. Reports food has not had same taste over past 2-3 months. Denies having any respiratory symptoms during that time. Reports juice does not taste good anymore. Reports she has lost 40 lb since October. Reports some was d/t fluid retention.   Pt reports she does not like checking blood sugar but knows she needs to do it and does it 1-2 times daily. Reports she tries to switch up which finger and do it on the side of the finger. Recent fastings have been between 35-139. Reports having hypoglycemia most morning when she checks her blood sugar before eating. Pt was taking Glipizide but MD recommended stopping it when she had appointment earlier this week d/t low blood sugars. Reports this morning BG was 48. Reports feeling shaky, tired, and having blurred vision when blood sugar is less than 70. Over past week pt reports that postprandial  readings have been above 200 most of the time.   Pt reports she was dx with prediabetes +10 years ago when she was taking a large amount of prednisone and received some diabetes education at that time.  Pt reports husband has anxiety and has had increased anxiety recently. Reports husband tends to shutdown when stressed. 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 Pt reports she has seen a counselor in the past but not consistently.   Diabetes Self-Management Education - 01/28/19 1049      Visit Information   Visit Type  First/Initial      Initial Visit   Diabetes Type  Type 2    Are you taking your medications as prescribed?  --   Reports she has not advanced Metformin to 4 pills yet. Pt has talked with her doctor about waiting to advance to 4. Pt reports sometimes she forgets to take medications.   Date Diagnosed  12/2018      Health Coping   How would you rate your overall health?  Fair      Psychosocial Assessment   Patient Belief/Attitude about Diabetes  Motivated to manage diabetes   Motivated and afraid   Self-care barriers  Unable to determine    Self-management support  Family;Doctor's office    Other  persons present  Patient    Patient Concerns  Nutrition/Meal planning    Special Needs  Unable to determine    Preferred Learning Style  No preference indicated    Learning Readiness  Ready    How often do you need to have someone help you when you read instructions, pamphlets, or other written materials from your doctor or pharmacy?  1 - Never    What is the last grade level you completed in school?  Post Master's Certificate      Pre-Education Assessment   Patient understands the diabetes disease and treatment process.  Needs Instruction    Patient understands incorporating nutritional management into lifestyle.  Needs Instruction     Patient undertands incorporating physical activity into lifestyle.  Needs Instruction    Patient understands using medications safely.  Needs Instruction    Patient understands monitoring blood glucose, interpreting and using results  Needs Instruction    Patient understands prevention, detection, and treatment of acute complications.  Needs Instruction    Patient understands prevention, detection, and treatment of chronic complications.  Needs Instruction    Patient understands how to develop strategies to address psychosocial issues.  Needs Instruction    Patient understands how to develop strategies to promote health/change behavior.  Needs Instruction      Complications   Last HgB A1C per patient/outside source  --   Last fasting taken at MD visit 173   How often do you check your blood sugar?  1-2 times/day    Fasting Blood glucose range (mg/dL)  <70;70-129    Postprandial Blood glucose range (mg/dL)  >200    Number of hypoglycemic episodes per month  25    Can you tell when your blood sugar is low?  Yes    What do you do if your blood sugar is low?  drinks juice    Number of hyperglycemic episodes per week  6    Can you tell when your blood sugar is high?  No    Have you had a dilated eye exam in the past 12 months?  Yes    Have you had a dental exam in the past 12 months?  Yes    Are you checking your feet?  No   Pt reports it is hard for her to check her feet d/t hx of hip surgeries.     Dietary Intake   Breakfast  8:15-8:30 AM: 2 Eggo waffles, regular syrup, 2 oz orange juice (d/t low BG)    Snack (afternoon)  4 PM: [employer] small order waffle fries; 7 PM: ~16 oz Coke/Cherry icee from [employer]  9 PM: [place] 1 1/3 cup spaghetti with meat sauce, 2.5 small pieces of toast    Beverage(s)  ~8 oz water; 2 oz juice; ~16 oz icee      Exercise   Exercise Type  ADL's    How many days per week to you exercise?  0    How many minutes per day do you exercise?  0     Total minutes per week of exercise  0      Patient Education   Previous Diabetes Education  Yes (please comment)   Pt received education when dx with prediabetes more than 10 years ago.   Disease state   Definition of diabetes, type 1 and 2, and the diagnosis of diabetes;Factors that contribute to the development of diabetes    Nutrition management  Role of diet in the treatment of diabetes and the relationship between the three main macronutrients and blood glucose level;Food label reading, portion sizes and measuring food.;Carbohydrate counting;Reviewed blood glucose goals for pre and post meals and how to evaluate the patients' food intake on their blood glucose level.    Physical activity and exercise   Role of exercise on diabetes management, blood pressure control and cardiac health.    Monitoring  Interpreting lab values - A1C, lipid, urine microalbumina.;Daily foot exams;Yearly dilated eye exam;Purpose and frequency of SMBG.    Acute complications  Taught treatment of hypoglycemia - the 15 rule.;Discussed and identified patients' treatment of hyperglycemia.    Chronic complications  Relationship between chronic complications and blood glucose control;Assessed and discussed foot care and prevention of foot problems;Lipid levels, blood glucose control and heart disease;Dental care;Retinopathy and reason for yearly dilated eye exams;Nephropathy, what it is, prevention of, the use of ACE, ARB's and early detection of through urine microalbumia.;Reviewed with patient heart disease, higher risk of, and prevention    Psychosocial adjustment  Role of stress on diabetes;Worked with patient to identify barriers to care and solutions    Personal strategies to promote health  Lifestyle issues that need to be addressed for better diabetes care      Individualized Goals (developed by patient)   Nutrition  Follow meal plan discussed;General guidelines for healthy choices and portions discussed     Medications  take my medication as prescribed;Other (comment)   Use pill box and reminders to prevent forgetting to take medications.   Monitoring   test my blood glucose as discussed   Log evening meals and times to assess effects on fasting blood sugar the next morning   Reducing Risk  examine blood glucose patterns;do foot checks daily;treat hypoglycemia with 15 grams of carbs if blood glucose less than 70mg /dL;Other (comment)   Eat a meal/snack every 3-5 hours     Post-Education Assessment   Patient understands the diabetes disease and treatment process.  Demonstrates understanding / competency    Patient understands incorporating nutritional management into lifestyle.  Demonstrates understanding / competency    Patient undertands incorporating physical activity into lifestyle.  Demonstrates understanding / competency    Patient understands using medications safely.  Demonstrates understanding / competency    Patient understands monitoring blood glucose, interpreting and using results  Demonstrates understanding / competency    Patient understands prevention, detection, and treatment of acute complications.  Demonstrates understanding / competency    Patient understands prevention, detection, and treatment of chronic complications.  Demonstrates understanding / competency    Patient understands how to develop strategies to address psychosocial issues.  Demonstrates understanding / competency    Patient understands how to develop strategies to promote health/change behavior.  Demonstrates understanding / competency      Outcomes   Expected Outcomes  Demonstrated interest in learning. Expect positive outcomes    Future DMSE  4-6 wks    Program Status  Completed       Individualized Plan for Diabetes Self-Management Training:   Learning Objective:  Patient will have a greater understanding of diabetes self-management. Patient education plan is to attend individual and/or group sessions  per assessed needs and concerns.   Plan: Dietitian provided DSME. Discussed importance of having consistent meals and carbohydrate intake to manage blood sugar and prevent highs and lows which pt reports having frequently. Encouraged pt to carry fast acting carbohydrate with her d/t reports of frequent lows. Encouraged pt to talk  with her doctor is she continues to have concerns regarding lows. Discussed getting in adequate fluid. Encouraged counseling services to help with stress. Discussed plan for having regular meals as well as plan for taking medication regularly. Pt appeared agreeable to information/goals discussed.   Patient Instructions  Instructions/Goals:  Goal: Eat every 3-5 hours. Include protein and carbohydrate each time you eat. Try to have balanced meals like the plate.   Recommend 2-3 carbohydrate choices per meal (30-45 g per meal). For nutrition labels: look under "total carbohydrate" and divide by 15 to see how many carb choices it contains.   1) Make meal plan and shopping list.  2) Plan to go shopping on certain day(s) of the week for the upcoming week.  3) Prep what you can of meals/snacks ahead of time.   4) Take work breaks that allow for you to eat every 3-5 hours consistently  -Log evening foods to assess effects on fasting blood sugar the next morning. Continue checking fasting blood sugar and 2 times after meals each day (1-2 hours after start of a meal)  -Work to include 64 oz fluid per day. May start with goal of at least 40 oz and gradually work to 64 oz.   -Recommend filling pill box at a certain time each week and having reminders to take medications each day.   -Recommend considering counseling services to help manage stress and life changes.   Expected Outcomes:  Demonstrated interest in learning. Expect positive outcomes  Education material provided: ADA - How to Thrive: A Guide for Your Journey with Diabetes, My Plate, Snack sheet and Carbohydrate  counting sheet, list of local counselors  If problems or questions, patient to contact team via:  Phone and Email  Future DSME appointment: 4-6 wks

## 2019-01-28 NOTE — Patient Instructions (Addendum)
Instructions/Goals:  Goal: Eat every 3-5 hours. Include protein and carbohydrate each time you eat. Try to have balanced meals like the plate.   Recommend 2-3 carbohydrate choices per meal (30-45 g per meal). For nutrition labels: look under "total carbohydrate" and divide by 15 to see how many carb choices it contains.   1) Make meal plan and shopping list.  2) Plan to go shopping on certain day(s) of the week for the upcoming week.  3) Prep what you can of meals/snacks ahead of time.   4) Take work breaks that allow for you to eat every 3-5 hours consistently  -Log evening foods to assess effects on fasting blood sugar the next morning. Continue checking fasting blood sugar and 2 times after meals each day (1-2 hours after start of a meal)  -Work to include 64 oz fluid per day. May start with goal of at least 40 oz and gradually work to 64 oz.   -Recommend filling pill box at a certain time each week and having reminders to take medications each day.   -Recommend considering counseling services to help manage stress and life changes.

## 2019-01-29 ENCOUNTER — Other Ambulatory Visit (HOSPITAL_COMMUNITY): Payer: Self-pay | Admitting: *Deleted

## 2019-01-29 NOTE — Telephone Encounter (Signed)
Spoke w/pt, covid test sch for tomorrow at 1 pm, she is aware and agreeable to self quarantine afterwards

## 2019-01-30 ENCOUNTER — Other Ambulatory Visit (HOSPITAL_COMMUNITY)
Admission: RE | Admit: 2019-01-30 | Discharge: 2019-01-30 | Disposition: A | Discharge status: Home / Self Care | Payer: BC Managed Care – PPO | Source: Ambulatory Visit | Attending: Internal Medicine | Admitting: Internal Medicine

## 2019-01-30 DIAGNOSIS — Z1159 Encounter for screening for other viral diseases: Secondary | ICD-10-CM | POA: Insufficient documentation

## 2019-01-30 LAB — SARS CORONAVIRUS 2 (TAT 6-24 HRS): SARS Coronavirus 2: NEGATIVE

## 2019-02-03 ENCOUNTER — Ambulatory Visit (HOSPITAL_COMMUNITY)
Admission: RE | Admit: 2019-02-03 | Discharge: 2019-02-03 | Disposition: A | Discharge status: Home / Self Care | Payer: BC Managed Care – PPO | Source: Ambulatory Visit | Attending: Internal Medicine | Admitting: Internal Medicine

## 2019-02-03 ENCOUNTER — Other Ambulatory Visit: Payer: Self-pay

## 2019-02-03 DIAGNOSIS — I272 Pulmonary hypertension, unspecified: Secondary | ICD-10-CM | POA: Insufficient documentation

## 2019-02-03 LAB — PULMONARY FUNCTION TEST
DL/VA % pred: 71 %
DL/VA: 3.05 ml/min/mmHg/L
DLCO unc % pred: 45 %
DLCO unc: 10.82 ml/min/mmHg
FEF 25-75 Post: 1.01 L/sec
FEF 25-75 Pre: 0.76 L/sec
FEF2575-%Change-Post: 33 %
FEF2575-%Pred-Post: 35 %
FEF2575-%Pred-Pre: 26 %
FEV1-%Change-Post: 13 %
FEV1-%Pred-Post: 53 %
FEV1-%Pred-Pre: 47 %
FEV1-Post: 1.45 L
FEV1-Pre: 1.29 L
FEV1FVC-%Change-Post: 6 %
FEV1FVC-%Pred-Pre: 78 %
FEV6-%Change-Post: 4 %
FEV6-%Pred-Post: 63 %
FEV6-%Pred-Pre: 60 %
FEV6-Post: 2.08 L
FEV6-Pre: 2 L
FEV6FVC-%Change-Post: -1 %
FEV6FVC-%Pred-Post: 101 %
FEV6FVC-%Pred-Pre: 102 %
FVC-%Change-Post: 5 %
FVC-%Pred-Post: 62 %
FVC-%Pred-Pre: 58 %
FVC-Post: 2.11 L
FVC-Pre: 2 L
Post FEV1/FVC ratio: 69 %
Post FEV6/FVC ratio: 99 %
Pre FEV1/FVC ratio: 64 %
Pre FEV6/FVC Ratio: 100 %
RV % pred: 135 %
RV: 2.54 L
TLC % pred: 82 %
TLC: 4.59 L

## 2019-02-03 MED ORDER — ALBUTEROL SULFATE (2.5 MG/3ML) 0.083% IN NEBU
2.5000 mg | INHALATION_SOLUTION | Freq: Once | RESPIRATORY_TRACT | Status: AC
  Administered 2019-02-03: 2.5 mg via RESPIRATORY_TRACT

## 2019-02-19 ENCOUNTER — Ambulatory Visit: Payer: BC Managed Care – PPO | Admitting: Registered"

## 2019-03-03 ENCOUNTER — Ambulatory Visit (HOSPITAL_COMMUNITY)
Admission: RE | Admit: 2019-03-03 | Discharge: 2019-03-03 | Disposition: A | Discharge status: Home / Self Care | Payer: BC Managed Care – PPO | Source: Ambulatory Visit | Attending: Internal Medicine | Admitting: Internal Medicine

## 2019-03-03 ENCOUNTER — Other Ambulatory Visit: Payer: Self-pay

## 2019-03-03 ENCOUNTER — Encounter (HOSPITAL_COMMUNITY): Payer: Self-pay | Admitting: Internal Medicine

## 2019-03-03 VITALS — BP 140/98 | HR 87 | Wt 169.8 lb

## 2019-03-03 DIAGNOSIS — I071 Rheumatic tricuspid insufficiency: Secondary | ICD-10-CM | POA: Insufficient documentation

## 2019-03-03 DIAGNOSIS — Z79899 Other long term (current) drug therapy: Secondary | ICD-10-CM | POA: Diagnosis not present

## 2019-03-03 DIAGNOSIS — M3214 Glomerular disease in systemic lupus erythematosus: Secondary | ICD-10-CM | POA: Diagnosis not present

## 2019-03-03 DIAGNOSIS — I272 Pulmonary hypertension, unspecified: Secondary | ICD-10-CM | POA: Insufficient documentation

## 2019-03-03 DIAGNOSIS — Z880 Allergy status to penicillin: Secondary | ICD-10-CM | POA: Insufficient documentation

## 2019-03-03 DIAGNOSIS — R0602 Shortness of breath: Secondary | ICD-10-CM | POA: Diagnosis not present

## 2019-03-03 DIAGNOSIS — I270 Primary pulmonary hypertension: Secondary | ICD-10-CM

## 2019-03-03 DIAGNOSIS — Z8249 Family history of ischemic heart disease and other diseases of the circulatory system: Secondary | ICD-10-CM | POA: Diagnosis not present

## 2019-03-03 DIAGNOSIS — Z888 Allergy status to other drugs, medicaments and biological substances status: Secondary | ICD-10-CM | POA: Insufficient documentation

## 2019-03-03 DIAGNOSIS — Z7984 Long term (current) use of oral hypoglycemic drugs: Secondary | ICD-10-CM | POA: Diagnosis not present

## 2019-03-03 DIAGNOSIS — I10 Essential (primary) hypertension: Secondary | ICD-10-CM | POA: Diagnosis not present

## 2019-03-03 DIAGNOSIS — Z832 Family history of diseases of the blood and blood-forming organs and certain disorders involving the immune mechanism: Secondary | ICD-10-CM | POA: Insufficient documentation

## 2019-03-03 DIAGNOSIS — Z803 Family history of malignant neoplasm of breast: Secondary | ICD-10-CM | POA: Diagnosis not present

## 2019-03-03 DIAGNOSIS — R7303 Prediabetes: Secondary | ICD-10-CM | POA: Diagnosis not present

## 2019-03-03 DIAGNOSIS — M329 Systemic lupus erythematosus, unspecified: Secondary | ICD-10-CM

## 2019-03-03 LAB — BASIC METABOLIC PANEL
Anion gap: 8 (ref 5–15)
BUN: 23 mg/dL — ABNORMAL HIGH (ref 6–20)
CO2: 19 mmol/L — ABNORMAL LOW (ref 22–32)
Calcium: 8.6 mg/dL — ABNORMAL LOW (ref 8.9–10.3)
Chloride: 116 mmol/L — ABNORMAL HIGH (ref 98–111)
Creatinine, Ser: 1.49 mg/dL — ABNORMAL HIGH (ref 0.44–1.00)
GFR calc Af Amer: 48 mL/min — ABNORMAL LOW (ref >60)
GFR calc non Af Amer: 42 mL/min — ABNORMAL LOW (ref >60)
Glucose, Bld: 55 mg/dL — ABNORMAL LOW (ref 70–99)
Potassium: 4.2 mmol/L (ref 3.5–5.1)
Sodium: 143 mmol/L (ref 135–145)

## 2019-03-03 NOTE — Progress Notes (Signed)
Advanced [facility] Failure Clinic Note  Date:  03/03/2019   ID:  [name], DOB 1970-09-17, MRN [id number]  Location: Home  Provider location: [facility] Type of Visit: Established patient  PCP:  [name], MD  Cardiologist:  No primary care provider on file. Primary HF: Gabrille Kilbride  Chief Complaint: Heart Failure follow-up   History of Present Illness:  [name] is a 48 y.o. 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   Seen by Dr. Martinique 06/16/18 with complaints of worsening LE edema since July. Worse towards the end of the day. She was started on lasix previously without much improvement. She has h/o the same due to her nephritis, but protein levels have been stable. She follows with Dr. Amil Amen with Rheumatology and Dr. Moshe Cipro with Nephrology. Sent for Echo as below which showed pulmonary HTN, likely due to her Lupus.   Echo 06/17/18 LVEF 65-70%, Mild MR, Severe RAE, Severe TR, Trivial PI, PA peak pressure 68 mm/Hg, trivial pericardial effusion.  We saw her for the first time in the pulmonary HTN clinic in 12/19. She subsequently underwent R/LHC in 12/19 which sowed normal coronary arteries with mild PAH 43/18 (26) but normal PVR and high output.  Had echo 2/20. EF 55-60% RV mildly dilated. Function ok. Septum flat. Severe TR, RVSP measured at 84mmHG. Bubble negative   RHC 12/19  Ao = 115/73 (92) LV = 116/12 RA = 8 RV = 39/10 PA = 43/18 (26) PCW = 13 Fick cardiac output/index = 8.7/4.5 PVR = 1.5 WU FA sat = 99% PA sat = 73%, 75% SVC sat = 79%   At last visit echo continued to show RV strain and severely elevated RVSP. Right heart cath repeated in 6/20 without evidence of PAH. Says she feels ok. Can do ADLs and go to the store without too much problem. Gets fatigued afterward. Edema well controlled. Sometimes feels a little  dizzy but no syncope.    RHC 01/02/19 RA = 5 RV = 34/7 PA =  34/6 (20) PCW = 8 Fick cardiac output/index = 5.9/3.1 PVR = 2.0 WU Ao sat = 100% PA sat = 71%, 72% SVC sat = 66%   PFTs 02/03/19 FEV1 1.29 (47%) FVC 2.00 (58%) Ratio 64%  DLCO 45%  Says she feels pretty good. Can do all ADLs and go to the grocery store and walk around without too much problem. Edema well controlled. Sometimes feels a little dizzy but no syncope.    [name] denies symptoms worrisome for COVID 19.   Past Medical History:  Diagnosis Date  . Avascular necrosis ([place])   . Hypertension   . Lupus ([place])   . Lupus nephritis ([place])   . Prediabetes 10/2011  . 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 bilat hip grafts     2007  . BUNIONECTOMY  2018  . HIP SURGERY     x2  . RIGHT HEART CATH N/A 01/02/2019   Procedure: RIGHT HEART CATH;  Surgeon: Jolaine Artist, MD;  Location: [facility];  Service: Cardiovascular;  Laterality: N/A;  . RIGHT/LEFT HEART CATH AND CORONARY ANGIOGRAPHY N/A 07/11/2018   Procedure: RIGHT/LEFT HEART CATH AND CORONARY ANGIOGRAPHY;  Surgeon: Jolaine Artist, MD;  Location: [facility];  Service: Cardiovascular;  Laterality: N/A;  . TOTAL  HIP ARTHROPLASTY  08/2016 and 10/2016   x2  . UMBILICAL HERNIA REPAIR       Current Outpatient Medications  Medication Sig Dispense Refill  . acetaminophen (TYLENOL) 500 MG tablet Take 500-1,000 mg by mouth every 8 (eight) hours as needed (pain).     Marland Kitchen albuterol (VENTOLIN HFA) 108 (90 Base) MCG/ACT inhaler Inhale 2 puffs into the lungs every 4 (four) hours as needed for wheezing or shortness of breath.     . Ca Carbonate-Mag Hydroxide (ROLAIDS PO) Take 2 tablets by mouth daily as needed (acid reflux).    Marland Kitchen empagliflozin (JARDIANCE) 10 MG TABS tablet Take 10 mg by mouth daily.    . hydroxychloroquine (PLAQUENIL) 200 MG tablet Take 400 mg by mouth  daily.     Marland Kitchen ibuprofen (ADVIL,MOTRIN) 200 MG tablet Take 200-400 mg by mouth every 8 (eight) hours as needed (pain).     Marland Kitchen losartan (COZAAR) 50 MG tablet Take 50 mg by mouth daily.    . metFORMIN (GLUCOPHAGE) 500 MG tablet Take 2,000 mg by mouth daily with breakfast.     . naproxen sodium (ALEVE) 220 MG tablet Take 220 mg by mouth daily as needed (pain).    Marland Kitchen spironolactone (ALDACTONE) 25 MG tablet Take 0.5 tablets (12.5 mg total) by mouth daily. 15 tablet 6   No current facility-administered medications for this encounter.     Allergies:   Lisinopril and Penicillins   Social History:  The patient  reports that she has never smoked. She has never used smokeless tobacco. She reports that she does not drink alcohol or use drugs.   Family History:  The patient's family history includes Anemia in her father; Breast cancer in her maternal grandmother and mother; Cancer in her maternal grandfather, maternal grandmother, and mother; Clotting disorder in her father; Heart disease in her mother; Hypertension in her father; Sleep apnea in her father.   ROS:  Please see the history of present illness.   All other systems are personally reviewed and negative.   Vitals:   03/03/19 1158  BP: (!) 140/98  Pulse: 87  SpO2: 98%    Exam:   General:  Well appearing. No resp difficulty HEENT: normal Neck: supple. no JVD. Carotids 2+ bilat; no bruits. No lymphadenopathy or thryomegaly appreciated. Cor: PMI nondisplaced. Regular rate & rhythm. 2/6 TR with prominent P2 Lungs: clear Abdomen: soft, nontender, nondistended. No hepatosplenomegaly. No bruits or masses. Good bowel sounds. Extremities: no cyanosis, clubbing, rash, edema Neuro: alert & orientedx3, cranial nerves grossly intact. moves all 4 extremities w/o difficulty. Affect pleasant  Recent Labs: 04/03/2018: ALT 17 08/13/2018: B Natriuretic Peptide 306.2 12/30/2018: BUN 15; Creatinine, Ser 1.04; Platelets 104 01/02/2019: Hemoglobin 7.8; Potassium  3.6; Sodium 147  Personally reviewed   Wt Readings from Last 3 Encounters:  01/28/19 78.1 kg (172 lb 3.2 oz)  01/02/19 76.7 kg (169 lb)  08/13/18 88.1 kg (194 lb 3.2 oz)     6 minute walk test completed.  Pt ambulated 900 ft (274 m) O2 sats ranged 96-98% on RA.  ASSESSMENT AND PLAN:  1. Pulmonary HTN - Echo 06/17/18 LVEF 65-70%, Mild MR, Severe RAE, Severe TR, Trivial PI, PA peak pressure 68 mm/Hg, trivial pericardial effusion.  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 Pilar Plate at [facility] and will also consider CT angio chest to further evaluate for shunt physiology though bubble study negative - Will need sleep study and possible cMRI to better evaluate RV (had normal coronaries on recent cath so no previous RV infarct to explain)  2. Lupus with history of Nephritis - Followed by Dr. Amil Amen and Dr. Moshe Cipro (Nephrology) - On Plaquenil per Dr. Amil Amen.  - Now off cytoxan   3. HTN - Labile but overall blood pressure well controlled (high today due to not taking am meds yet) Continue current regimen.  4. Severe tricuspid regurgitation  - likely secondary to dilated RV and poor leaflet coaptation. That said currently unclear why RV so dilated in setting of normal PA pressures and no exertional hypoxemia - Will need sleep study and cMRI   Total time spent 45 minutes. Over half that time spent discussing above.    Signed, Glori Bickers, MD  03/03/2019 11:59 AM  [facility] [place] [address] [place] [address] [phone] (office) [phone] (fax)

## 2019-03-03 NOTE — Progress Notes (Signed)
6 minute walk test completed.  Pt ambulated 900 ft (274 m) O2 sats ranged 96-98% on RA.

## 2019-03-03 NOTE — Patient Instructions (Signed)
Dr Haroldine Laws has recommended you have the following test completed:   VQ Scan  Chest x-ray  High Resolution CT Scan of your chest  Your physician recommends that you schedule a follow-up appointment in: 6 weeks  At the [facility], you and your health needs are our priority. As part of our continuing mission to provide you with exceptional heart care, we have created designated Provider Care Teams. These Care Teams include your primary Cardiologist (physician) and Advanced Practice Providers (APPs- Physician Assistants and Nurse Practitioners) who all work together to provide you with the care you need, when you need it.   You may see any of the following providers on your designated Care Team at your next follow up: Marland Kitchen Dr Glori Bickers . Dr Loralie Champagne . Darrick Grinder, NP   Please be sure to bring in all your medications bottles to every appointment.

## 2019-03-05 ENCOUNTER — Ambulatory Visit: Payer: BC Managed Care – PPO | Admitting: Registered"

## 2019-03-20 ENCOUNTER — Encounter (HOSPITAL_COMMUNITY)
Admission: RE | Admit: 2019-03-20 | Discharge: 2019-03-20 | Disposition: A | Discharge status: Home / Self Care | Payer: BC Managed Care – PPO | Source: Ambulatory Visit | Attending: Internal Medicine | Admitting: Internal Medicine

## 2019-03-20 ENCOUNTER — Other Ambulatory Visit: Payer: Self-pay

## 2019-03-20 ENCOUNTER — Other Ambulatory Visit (HOSPITAL_COMMUNITY): Payer: Self-pay | Admitting: Internal Medicine

## 2019-03-20 ENCOUNTER — Ambulatory Visit (HOSPITAL_COMMUNITY)
Admission: RE | Admit: 2019-03-20 | Discharge: 2019-03-20 | Disposition: A | Discharge status: Home / Self Care | Payer: BC Managed Care – PPO | Source: Ambulatory Visit | Attending: Internal Medicine | Admitting: Internal Medicine

## 2019-03-20 DIAGNOSIS — I270 Primary pulmonary hypertension: Secondary | ICD-10-CM

## 2019-03-20 MED ORDER — TECHNETIUM TO 99M ALBUMIN AGGREGATED
1.5000 | Freq: Once | INTRAVENOUS | Status: AC | PRN
  Administered 2019-03-20: 09:00:00 1.5 via INTRAVENOUS

## 2019-03-24 ENCOUNTER — Other Ambulatory Visit: Payer: Self-pay

## 2019-03-24 ENCOUNTER — Ambulatory Visit (HOSPITAL_COMMUNITY)
Admission: RE | Admit: 2019-03-24 | Discharge: 2019-03-24 | Disposition: A | Discharge status: Home / Self Care | Payer: BC Managed Care – PPO | Source: Ambulatory Visit | Attending: Internal Medicine | Admitting: Internal Medicine

## 2019-03-24 DIAGNOSIS — I270 Primary pulmonary hypertension: Secondary | ICD-10-CM | POA: Diagnosis not present

## 2019-03-25 ENCOUNTER — Telehealth (HOSPITAL_COMMUNITY): Payer: Self-pay

## 2019-03-25 ENCOUNTER — Ambulatory Visit: Payer: BC Managed Care – PPO | Admitting: Registered"

## 2019-03-25 NOTE — Telephone Encounter (Signed)
Reviewed both cxr and pulmonary perfusion results with pt. Pt agreeable and appreciative.

## 2019-03-25 NOTE — Telephone Encounter (Signed)
-----   Message from Jolaine Artist, MD sent at 03/24/2019  4:34 PM EDT ----- No PE

## 2019-03-25 NOTE — Telephone Encounter (Signed)
Called pt to review results. No answer. Left voicemail for call back.

## 2019-03-27 ENCOUNTER — Telehealth (HOSPITAL_COMMUNITY): Payer: Self-pay

## 2019-03-27 NOTE — Telephone Encounter (Signed)
Called pt and left voicemail to review chest ct. No answer. Left voicemail for call back.

## 2019-03-27 NOTE — Telephone Encounter (Signed)
-----   Message from Jolaine Artist, MD sent at 03/25/2019  3:24 PM EDT ----- CT no ILD

## 2019-04-09 ENCOUNTER — Encounter: Payer: Self-pay | Admitting: Registered"

## 2019-04-09 ENCOUNTER — Other Ambulatory Visit: Payer: Self-pay

## 2019-04-09 ENCOUNTER — Encounter: Payer: BC Managed Care – PPO | Attending: Family Medicine | Admitting: Registered"

## 2019-04-09 DIAGNOSIS — E119 Type 2 diabetes mellitus without complications: Secondary | ICD-10-CM | POA: Insufficient documentation

## 2019-04-09 NOTE — Progress Notes (Signed)
Diabetes Self-Management Education  Visit Type:    Appt. Start Time: 1545 Appt. End Time: [id number]  04/09/2019  [name], identified by name and date of birth, is a 48 y.o. female with a diagnosis of Diabetes:  .   ASSESSMENT  Last menstrual period 04/02/2017. Body mass index is 26.94 kg/m.   Pt works for [employer]. Pt has been dx with lupus for 18 years.  Pt reports she went to her doctor on Monday and her a1c was 6.6. Reports her numbers were still very low in the morning-39 that morning and then 59 while at the doctor. Reports she bought a new meter as her doctor advised to ensure the meter was not giving her false low readings. Reports she has not yet used the new meter. Reports her fasting was 78 this morning with her old meter which was the highest for fasting in a while. Reports she ate a cupcake last night. Also reports she is no longer taking Metformin since last doctor appointment at the beginning of the week. Reports feeling better since going off of Metformin. Reports yesterday she felt better than she has in a while. Pt is now only on Jardiance. Reports fasting range since last appointment has been 30s-78. Pt denies feeling dizzy when having lows, but reports she is used to feeling that way so she may not notice. Reports feeling better today and yesterday than usual. Reports usually her eyes are blurry when she wakes up in the morning and she will feel very slow. Reports today and yesterday she had more energy and did not have blurry vision. Pt reports her stomach has felt a lot better since going off of metformin.    Pt reports she has shopped better but has not improved with her eating. Reports it is hard for her to decide what to eat and amounts are hard for her to think about. Reports she often skips eating carbohydrate foods because she knows she cannot have but a certain amount. Reports taste changes d/t dry mouth. Reports she prefers premeasured packaged foods. Pt  reports that she usually does not have breakfast until 9AM if at all. Dinner is later on Mondays and Tuesdays usually after 730-~9PM. Otherwise 630-730 PM. Pt reports that she is a picky eater. Does not like [place] yogurt, nuts, not really into peanut butter either per pt. Reports reducing soda intake to having coke only 5 times in past 6-7 weeks.    24-Hour Recall: Breakfast: 11 AM: croissant with a little butter.  Snk: None reported.  Lunch: beef stew (white potatoes, carrots, onions, beef) Snk: None reported.  Dinner beef stew (white potatoes, carrots, onions, beef), cupcake Beverages: ~32-64 oz water.   Individualized Plan for Diabetes Self-Management Training:   Learning Objective:  Patient will have a greater understanding of diabetes self-management. Patient education plan is to attend individual and/or group sessions per assessed needs and concerns.   Plan: Dietitian reviewed tx for hypoglycemia and discussed importance of having consistent carbohydrate intake to help reduce lows. Discussed pt portioning out foods in bags/containers and already portioned snack foods to help reduce anxiety around food portions which pt reports causes her to skip meals at times. Discussed checking blood sugar after meals at times to see how food intake if affecting BG. Discussed that with pt's hgba1c of 6.6 and reports of extremely low fasting blood sugar it is still likely she may be having high readings at other times which are evening out hgba1c to 6.6.  Discussed logging evening meal and fasting BG the next day to see how it is affected. Pt appeared agreeable to information/goals discussed.   Instructions/Goals:   Continued Goal: Eat every 3-5 hours. Include protein and carbohydrate each time you eat. Try to have balanced meals like the plate.   Recommend 2-3 carbohydrate choices per meal (30-45 g per meal). For nutrition labels: look under "total carbohydrate" and divide by 15 to see how many carb  choices it contains.   1) Make meal plan and shopping list.  2) Plan to go shopping on certain day(s) of the week for the upcoming week.  3) Prep what you can of meals/snacks ahead of time.   4) Take work breaks that allow for you to eat every 3-5 hours consistently  Eating something even if off from recommended 2-3 carb choices, is better than eating nothing.   Recommend Keeping Balanced premeasured snacks which includes ~15 g CHO and 8 g pro:   Protein bar with ~15 g total CHO and 8 g pro  1 piece of fruit and 1-2 tbsp peanut butter or 1 low fat string cheese    -Log evening foods to assess effects on fasting blood sugar the next morning. Continue checking fasting blood sugar and 2 times after meals each day (1-2 hours after start of a meal).   -Continue working to have at least 48-64 oz water daily   Patient Instructions  Instructions/Goals:   Continued Goal: Eat every 3-5 hours. Include protein and carbohydrate each time you eat. Try to have balanced meals like the plate.   Recommend 2-3 carbohydrate choices per meal (30-45 g per meal). For nutrition labels: look under "total carbohydrate" and divide by 15 to see how many carb choices it contains.   1) Make meal plan and shopping list.  2) Plan to go shopping on certain day(s) of the week for the upcoming week.  3) Prep what you can of meals/snacks ahead of time.   4) Take work breaks that allow for you to eat every 3-5 hours consistently  Eating something even if off from recommended 2-3 carb choices, is better than eating nothing.   Recommend Keeping Balanced premeasured snacks which includes ~15 g CHO and 8 g pro:   Protein bar with ~15 g total CHO and 8 g pro  1 piece of fruit and 1-2 tbsp peanut butter or 1 low fat string cheese    -Log evening foods to assess effects on fasting blood sugar the next morning. Continue checking fasting blood sugar and 2 times after meals each day (1-2 hours after start of a  meal).   -Continue working to have at least 48-64 oz water daily    Education material provided: None.   If problems or questions, patient to contact team via:  Phone and Email  Future DSME appointment:  1 month.

## 2019-04-09 NOTE — Patient Instructions (Addendum)
Instructions/Goals:   Continued Goal: Eat every 3-5 hours. Include protein and carbohydrate each time you eat. Try to have balanced meals like the plate.   Recommend 2-3 carbohydrate choices per meal (30-45 g per meal). For nutrition labels: look under "total carbohydrate" and divide by 15 to see how many carb choices it contains.   1) Make meal plan and shopping list.  2) Plan to go shopping on certain day(s) of the week for the upcoming week.  3) Prep what you can of meals/snacks ahead of time.   4) Take work breaks that allow for you to eat every 3-5 hours consistently  Eating something even if off from recommended 2-3 carb choices, is better than eating nothing.   Recommend Keeping Balanced premeasured snacks which includes ~15 g CHO and 8 g pro:   Protein bar with ~15 g total CHO and 8 g pro  1 piece of fruit and 1-2 tbsp peanut butter or 1 low fat string cheese    -Log evening foods to assess effects on fasting blood sugar the next morning. Continue checking fasting blood sugar and 2 times after meals each day (1-2 hours after start of a meal).   -Continue working to have at least 48-64 oz water daily

## 2019-04-15 ENCOUNTER — Other Ambulatory Visit: Payer: Self-pay

## 2019-04-15 ENCOUNTER — Telehealth (HOSPITAL_COMMUNITY): Payer: Self-pay

## 2019-04-15 ENCOUNTER — Encounter (HOSPITAL_COMMUNITY): Payer: Self-pay | Admitting: Internal Medicine

## 2019-04-15 ENCOUNTER — Ambulatory Visit (HOSPITAL_COMMUNITY)
Admission: RE | Admit: 2019-04-15 | Discharge: 2019-04-15 | Disposition: A | Discharge status: Home / Self Care | Payer: BC Managed Care – PPO | Source: Ambulatory Visit | Attending: Internal Medicine | Admitting: Internal Medicine

## 2019-04-15 VITALS — BP 141/92 | HR 72 | Wt 175.8 lb

## 2019-04-15 DIAGNOSIS — Z888 Allergy status to other drugs, medicaments and biological substances status: Secondary | ICD-10-CM | POA: Diagnosis not present

## 2019-04-15 DIAGNOSIS — I10 Essential (primary) hypertension: Secondary | ICD-10-CM | POA: Diagnosis not present

## 2019-04-15 DIAGNOSIS — I50810 Right heart failure, unspecified: Secondary | ICD-10-CM | POA: Diagnosis not present

## 2019-04-15 DIAGNOSIS — Z880 Allergy status to penicillin: Secondary | ICD-10-CM | POA: Insufficient documentation

## 2019-04-15 DIAGNOSIS — Z79899 Other long term (current) drug therapy: Secondary | ICD-10-CM | POA: Insufficient documentation

## 2019-04-15 DIAGNOSIS — Z8249 Family history of ischemic heart disease and other diseases of the circulatory system: Secondary | ICD-10-CM | POA: Diagnosis not present

## 2019-04-15 DIAGNOSIS — M3214 Glomerular disease in systemic lupus erythematosus: Secondary | ICD-10-CM | POA: Insufficient documentation

## 2019-04-15 DIAGNOSIS — R5383 Other fatigue: Secondary | ICD-10-CM | POA: Diagnosis not present

## 2019-04-15 DIAGNOSIS — I110 Hypertensive heart disease with heart failure: Secondary | ICD-10-CM | POA: Insufficient documentation

## 2019-04-15 DIAGNOSIS — E119 Type 2 diabetes mellitus without complications: Secondary | ICD-10-CM | POA: Diagnosis not present

## 2019-04-15 DIAGNOSIS — I272 Pulmonary hypertension, unspecified: Secondary | ICD-10-CM

## 2019-04-15 DIAGNOSIS — I071 Rheumatic tricuspid insufficiency: Secondary | ICD-10-CM | POA: Diagnosis not present

## 2019-04-15 DIAGNOSIS — R609 Edema, unspecified: Secondary | ICD-10-CM

## 2019-04-15 DIAGNOSIS — R0683 Snoring: Secondary | ICD-10-CM

## 2019-04-15 DIAGNOSIS — I739 Peripheral vascular disease, unspecified: Secondary | ICD-10-CM | POA: Diagnosis not present

## 2019-04-15 LAB — BASIC METABOLIC PANEL
Anion gap: 5 (ref 5–15)
BUN: 17 mg/dL (ref 6–20)
CO2: 22 mmol/L (ref 22–32)
Calcium: 8 mg/dL — ABNORMAL LOW (ref 8.9–10.3)
Chloride: 116 mmol/L — ABNORMAL HIGH (ref 98–111)
Creatinine, Ser: 1.23 mg/dL — ABNORMAL HIGH (ref 0.44–1.00)
GFR calc Af Amer: >60 mL/min (ref >60)
GFR calc non Af Amer: 53 mL/min — ABNORMAL LOW (ref >60)
Glucose, Bld: 167 mg/dL — ABNORMAL HIGH (ref 70–99)
Potassium: 3.8 mmol/L (ref 3.5–5.1)
Sodium: 143 mmol/L (ref 135–145)

## 2019-04-15 LAB — BRAIN NATRIURETIC PEPTIDE: B Natriuretic Peptide: 248.7 pg/mL — ABNORMAL HIGH (ref 0.0–100.0)

## 2019-04-15 MED ORDER — POTASSIUM CHLORIDE CRYS ER 20 MEQ PO TBCR: 20.0000 meq | EXTENDED_RELEASE_TABLET | ORAL | 6 refills | Status: AC

## 2019-04-15 MED ORDER — FUROSEMIDE 40 MG PO TABS: 40.0000 mg | ORAL_TABLET | ORAL | 6 refills | Status: AC

## 2019-04-15 NOTE — Telephone Encounter (Signed)
Patient Name:         DOB:       Height:     Weight:  Office Name:         Referring Provider:  Today's Date:  Date:   STOP BANG RISK ASSESSMENT S (snore) Have you been told that you snore?     YES   T (tired) Are you often tired, fatigued, or sleepy during the day?   YES  O (obstruction) Do you stop breathing, choke, or gasp during sleep? NO   P (pressure) Do you have or are you being treated for high blood pressure? YES   B (BMI) Is your body index greater than 35 kg/m? NO   A (age) Are you 52 years old or older? NO   N (neck) Do you have a neck circumference greater than 16 inches?   NO   G (gender) Are you a female? NO   TOTAL STOP/BANG "YES" ANSWERS 3                                                                       For Office Use Only              Procedure Order Form    YES to 3+ Stop Bang questions OR two clinical symptoms - patient qualifies for WatchPAT (CPT 95800)     Submit: This Form + Patient Face Sheet + Clinical Note via [facility] or Fax: [phone]         Clinical Notes: Will consult Sleep Specialist and refer for management of therapy due to patient increased risk of Sleep Apnea. Ordering a sleep study due to the following two clinical symptoms: Excessive daytime sleepiness G47.10 / Gastroesophageal reflux K21.9 / Nocturia R35.1 / Morning Headaches G44.221 / Difficulty concentrating R41.840 / Memory problems or poor judgment G31.84 / Personality changes or irritability R45.4 / Loud snoring R06.83 / Depression F32.9 / Unrefreshed by sleep G47.8 / Impotence N52.9 / History of high blood pressure R03.0 / Insomnia G47.00    I understand that I am proceeding with a home sleep apnea test as ordered by my treating physician. I understand that untreated sleep apnea is a serious cardiovascular risk factor and it is my responsibility to perform the test and seek management for sleep apnea. I will be contacted with the results and be managed for sleep apnea by a  local sleep physician. I will be receiving equipment and further instructions from [facility]. I shall promptly ship back the equipment via the included mailing label. I understand my insurance will be billed for the test and as the patient I am responsible for any insurance related out-of-pocket costs incurred. I have been provided with written instructions and can call for additional video or telephonic instruction, with 24-hour availability of qualified personnel to answer any questions: Patient Help Desk [phone].  Patient Signature ______________________________________________________   Date______________________ Patient Telemedicine Verbal Consent

## 2019-04-15 NOTE — Progress Notes (Signed)
Advanced Heart Failure Clinic Note  Date:  04/15/2019   ID:  [name], DOB [birth year], MRN [id number]  Location: Home  Provider location: [facility] Type of Visit: Established patient  PCP:  [name], MD  Cardiologist:  No primary care provider on file. Primary HF: [name]  Chief Complaint: Heart Failure follow-up   History of Present Illness:  [name] is a 48 y.o. 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   Seen by Dr. Martinique 06/16/18 with complaints of worsening LE edema since July. Worse towards the end of the day. She was started on lasix previously without much improvement. She has h/o the same due to her nephritis, but protein levels have been stable. She follows with Dr. Amil Amen with Rheumatology and Dr. Moshe Cipro with Nephrology. Sent for Echo as below which showed pulmonary HTN, likely due to her Lupus.   Echo 06/17/18 LVEF 65-70%, Mild MR, Severe RAE, Severe TR, Trivial PI, PA peak pressure 68 mm/Hg, trivial pericardial effusion.  We saw her for the first time in the pulmonary HTN clinic in 12/19. She subsequently underwent R/LHC in 12/19 which sowed normal coronary arteries with mild PAH 43/18 (26) but normal PVR and high output.  Had echo 2/20. EF 55-60% RV mildly dilated. Function ok. Septum flat. Severe TR, RVSP measured at 68mmHG. Bubble negative  Right heart cath repeated in 6/20 due to persistent RV strain on echo. RHC without evidence of PAH.   At last visit we ordered Hi-res CT and VQ study to further evaluate possible causes of RV strain.  Hi-res CT 03/24/19: No ILD VQ study (94/20): No PE. Changes c/w PHTN  Says she feels pretty good. Can do all her ADLs and go to the store without much problem but does have to take frequent breaks.  Occasionally will get a mild wheeze. Over last few weeks LE swelling has  increased. Mild dizziness when standing or if walking too long. Weight up 6 pounds over last month. + fatigue. Husband says she snores   Cardiac studies:  RHC 01/02/19 RA = 5 RV = 34/7 PA =  34/6 (20) PCW = 8 Fick cardiac output/index = 5.9/3.1 PVR = 2.0 WU Ao sat = 100% PA sat = 71%, 72% SVC sat = 66%   PFTs 02/03/19 FEV1 1.29 (47%) FVC 2.00 (58%) Ratio 64%  DLCO 45%  RHC 12/19  Ao = 115/73 (92) LV = 116/12 RA = 8 RV = 39/10 PA = 43/18 (26) PCW = 13 Fick cardiac output/index = 8.7/4.5 PVR = 1.5 WU FA sat = 99% PA sat = 73%, 75% SVC sat = 79%    Past Medical History:  Diagnosis Date  . Avascular necrosis ([place])   . Hypertension   . Lupus ([place])   . Lupus nephritis ([place])   . Prediabetes 10/2011  . TTP (thrombotic thrombopenic purpura) ([place]) 2007   Past Surgical History:  Procedure Laterality Date  . bilat hip grafts     2007  . BUNIONECTOMY  2018  . HIP SURGERY     x2  . RIGHT HEART CATH N/A 01/02/2019   Procedure: RIGHT HEART CATH;  Surgeon: Jolaine Artist, MD;  Location: [facility];  Service: Cardiovascular;  Laterality: N/A;  . RIGHT/LEFT HEART CATH AND CORONARY ANGIOGRAPHY N/A 07/11/2018   Procedure: RIGHT/LEFT HEART CATH AND CORONARY ANGIOGRAPHY;  Surgeon: Glori Bickers  R, MD;  Location: [facility];  Service: Cardiovascular;  Laterality: N/A;  . TOTAL HIP ARTHROPLASTY  08/2016 and 10/2016   x2  . UMBILICAL HERNIA REPAIR       Current Outpatient Medications  Medication Sig Dispense Refill  . acetaminophen (TYLENOL) 500 MG tablet Take 500-1,000 mg by mouth every 8 (eight) hours as needed (pain).     Marland Kitchen albuterol (VENTOLIN HFA) 108 (90 Base) MCG/ACT inhaler Inhale 2 puffs into the lungs every 4 (four) hours as needed for wheezing or shortness of breath.     . Ca Carbonate-Mag Hydroxide (ROLAIDS PO) Take 2 tablets by mouth daily as needed (acid reflux).    Marland Kitchen empagliflozin (JARDIANCE) 10 MG TABS tablet Take 10 mg by mouth daily.     . hydroxychloroquine (PLAQUENIL) 200 MG tablet Take 400 mg by mouth daily.     Marland Kitchen ibuprofen (ADVIL,MOTRIN) 200 MG tablet Take 200-400 mg by mouth every 8 (eight) hours as needed (pain).     Marland Kitchen losartan (COZAAR) 50 MG tablet Take 50 mg by mouth daily.    Marland Kitchen spironolactone (ALDACTONE) 25 MG tablet Take 0.5 tablets (12.5 mg total) by mouth daily. 15 tablet 6   No current facility-administered medications for this encounter.     Allergies:   Lisinopril and Penicillins   Social History:  The patient  reports that she has never smoked. She has never used smokeless tobacco. She reports that she does not drink alcohol or use drugs.   Family History:  The patient's family history includes Anemia in her father; Breast cancer in her maternal grandmother and mother; Cancer in her maternal grandfather, maternal grandmother, and mother; Clotting disorder in her father; Heart disease in her mother; Hypertension in her father; Sleep apnea in her father.   ROS:  Please see the history of present illness.   All other systems are personally reviewed and negative.   Vitals:   04/15/19 0955  BP: (!) 141/92  Pulse: 72  SpO2: 99%    Exam:   General:  Well appearing. No resp difficulty HEENT: normal Neck: supple. JVP 9-10 Carotids 2+ bilat; no bruits. No lymphadenopathy or thryomegaly appreciated. Cor: PMI nondisplaced. Regular rate & rhythm. No rubs, gallops or murmurs. Lungs: clear Abdomen: soft, nontender, nondistended. No hepatosplenomegaly. No bruits or masses. Good bowel sounds. Extremities: no cyanosis, clubbing, rash, 2+ edema Neuro: alert & orientedx3, cranial nerves grossly intact. moves all 4 extremities w/o difficulty. Affect pleasant   Recent Labs: 08/13/2018: B Natriuretic Peptide 306.2 12/30/2018: Platelets 104 01/02/2019: Hemoglobin 7.8 03/03/2019: BUN 23; Creatinine, Ser 1.49; Potassium 4.2; Sodium 143  Personally reviewed   Wt Readings from Last 3 Encounters:  04/15/19 79.7 kg (175  lb 12.8 oz)  04/09/19 78 kg (172 lb)  03/03/19 77 kg (169 lb 12.8 oz)     6 minute walk (03/03/19).  Pt ambulated 900 ft (274 m) O2 sats ranged 96-98% on RA.  ASSESSMENT AND PLAN:  1. Pulmonary HTN - Echo 06/17/18 LVEF 65-70%, Mild MR, Severe RAE, Severe TR, Trivial PI, PA peak pressure 68 mm/Hg, trivial pericardial effusion.  - Echo suggestive of WHO Group I PAH with RV strain and severe TR but RHC x 2 without significant PAH - Bubble study negative for shunt - PFTs with significant obstruction/restriction with markedly decreased DLCO - 6MW 03/03/19 without hypoxemia - Hi-res CT and VQ bout normal but showed evidence of PAH - It is hard for me to explain discrepancy between echo and RHC x  2. Echo suggests significant PAH with RV strain. Initial RHC with mildly elevated PA pressures in the setting of elevated CO with normal PVR. F/u RHC showed PA mean 20 with normal output. RA pressures have been normal. Ancillary studies also suggest presence of PAH.  - In setting of SLE I think the burden is upon [place] to prove that she does NOT truly have PAH. I think she will likely need RHC with exercise looking for exercise-induced PAH - I discussed case with Dr. Pilar Plate at [facility] and will also consider CT angio chest to further evaluate for shunt physiology though bubble study negative - Will get sleep study and cMRI to better evaluate RV (had normal coronaries on recent cath so no previous RV infarct to explain) then proceed with exercise RHC - Volume up today. Lasix stopped in January. Restart lasix 40 + KCL 20 on M/W/F  2. Lupus with history of Nephritis - Followed by Dr. Amil Amen and Dr. Moshe Cipro (Nephrology) - On Plaquenil per Dr. Amil Amen.  - Now off cytoxan   3. HTN - Labile but overall blood pressure well controlled (high today due to not taking am meds yet) Continue current regimen.  4. Severe tricuspid regurgitation  - likely secondary to dilated RV and poor leaflet coaptation. That said  currently unclear why RV so dilated in setting of normal PA pressures and no exertional hypoxemia - unclear if this is primary or secondary. (I suspect secondary)  - Will get sleep study and cMRI. Can get TEE as needed  5. DM2 - Sugars are still very labile.   6. Snoring and fatigue - check sleep study  Total time spent 35 minutes. Over half that time spent discussing above.    Signed, Glori Bickers, MD  04/15/2019 10:13 AM  [facility] [place] [address] [phone] (office) [phone] (fax)

## 2019-04-15 NOTE — Patient Instructions (Signed)
Labs were done today. We will only call you if there are any Abnormalities. No Call Is A Good Call!!   Medication Changes :   Start Furosemide 40 mg every Monday, Wednesday and Friday  Start Potassium 20 meq every Monday, Wednesday and Friday  Your provider has recommended that you have a home sleep study.  [employer] is the company that does these test.  They will contact you by phone and must speak with you before they can ship the equipment.  Once they have spoken with you they will send the equipment right to your home with instructions on how to set it up.  Once you have completed the test you just dispose of the equipment, the information is automatically uploaded to [place] via blue-tooth technology.  IF you have any questions or issues with the equipment please call the company directly at [phone].  If your test is positive for sleep apnea and you need a home CPAP machine you will be contacted by Dr Theodosia Blender office [facility]) to set this up.  Your physician has requested that you have a cardiac MRI. Cardiac MRI uses a computer to create images of your heart as its beating, producing both still and moving pictures of your heart and major blood vessels. For further information please visit [email or web address] Please follow the instruction sheet given to you today for more information. Once approved with your insurance you will be contacted for scheduling.  Your Physician would like you to follow up in 2 - 3 months.  At the [facility], you and your health needs are our priority. As part of our continuing mission to provide you with exceptional heart care, we have created designated Provider Care Teams. These Care Teams include your primary Cardiologist (physician) and Advanced Practice Providers (APPs- Physician Assistants and Nurse Practitioners) who all work together to provide you with the care you need, when you need it.   You may see any of the following providers  on your designated Care Team at your next follow up: Marland Kitchen Dr Glori Bickers . Dr Loralie Champagne . Darrick Grinder, NP   Please be sure to bring in all your medications bottles to every appointment.

## 2019-05-20 ENCOUNTER — Ambulatory Visit: Payer: BC Managed Care – PPO | Admitting: Registered"

## 2019-05-21 ENCOUNTER — Telehealth (HOSPITAL_COMMUNITY): Payer: Self-pay | Admitting: Emergency Medicine

## 2019-05-21 NOTE — Telephone Encounter (Signed)
Reaching out to patient to offer assistance regarding upcoming cardiac imaging study; pt verbalizes understanding of appt date/time, parking situation and where to check in,  and verified current allergies; name and call back number provided for further questions should they arise Marchia Bond RN Navigator Cardiac Imaging [facility] [phone] office [phone] cell  bilat hip replacements  Denies claustrophobia

## 2019-05-22 ENCOUNTER — Ambulatory Visit (HOSPITAL_COMMUNITY)
Admission: RE | Admit: 2019-05-22 | Discharge: 2019-05-22 | Disposition: A | Discharge status: Home / Self Care | Payer: BC Managed Care – PPO | Source: Ambulatory Visit | Attending: Internal Medicine | Admitting: Internal Medicine

## 2019-05-22 ENCOUNTER — Other Ambulatory Visit: Payer: Self-pay

## 2019-05-22 DIAGNOSIS — I50810 Right heart failure, unspecified: Secondary | ICD-10-CM | POA: Diagnosis not present

## 2019-05-22 MED ORDER — GADOBUTROL 1 MMOL/ML IV SOLN
8.0000 mL | Freq: Once | INTRAVENOUS | Status: AC | PRN
  Administered 2019-05-22: 11:00:00 8 mL via INTRAVENOUS

## 2019-05-26 ENCOUNTER — Telehealth (HOSPITAL_COMMUNITY): Payer: Self-pay

## 2019-05-26 NOTE — Telephone Encounter (Signed)
Received a Request from [facility],, height, weight, and notes with sleep symptoms. Faxed to [phone].

## 2019-05-27 ENCOUNTER — Other Ambulatory Visit: Payer: Self-pay

## 2019-05-27 ENCOUNTER — Encounter: Payer: BC Managed Care – PPO | Attending: Family Medicine | Admitting: Registered"

## 2019-05-27 DIAGNOSIS — E119 Type 2 diabetes mellitus without complications: Secondary | ICD-10-CM | POA: Diagnosis not present

## 2019-05-27 NOTE — Progress Notes (Signed)
Diabetes Self-Management Education  Visit Type:    Appt. Start Time: 1145 Appt. End Time: 1300  05/27/2019  [name], identified by name and date of birth, is a 48 y.o. female with a diagnosis of Diabetes:    ASSESSMENT  Last menstrual period 04/02/2017. There is no height or weight on file to calculate BMI.   Pt works for [employer]. Pt has been dx with lupus for 18 years.  Nutrition Follow-Up: Pt started synthroid about 2 weeks ago and started prednisone last week. Pt reports that she found out her blood sugar was spiking. Previously pt reported having low in the morning and pt was not checking at other times. Reports she now has a [employer]. Reports she has been having more spikes since starting prednisone. Reports having some 405-500 or greater. Pt reports fasting was 105 today but 405 yesterday and 270s earlier this week. Was 42 the morning before starting prednisone. Reports significant increase in urination since increases in blood sugar. Reports still having low appetite, even since starting prednisone. Reports she is starting insulin 1 time daily starting tomorrow. Pt will be taking insulin before lunch depending on blood sugar pre-lunch.   Pt reports she has still been struggling with eating consistently. Pt reports that many of the foods she does not eat she has never tried including many foods she prepares for her family but does not eat herself. Pt reports her mother telling her she used to eat some foods she no longer eats but doesn't recall remembering trying those foods as a child.   24-Hour Recall: Breakfast 9AM: 2 pieces bacon, 1 piece whole grain toast, water Snk: None reported.  Lunch: None reported.  Snk: 2 pieces bacon  Dinner: 630 PM: chicken skewer with 6 small pieces, water  Beverages: ~32-64 oz water.   Preferred/Accepted Foods:  Grains/Starches: most grains-likes whole grain breads; brown rice; willing to try quinoa; potatoes, pinto beans if  in chili only; dry cereal: golden grahams with milk Proteins: [place], chicken, beef, pork, doesn't care for seafood; pintos but only in chili; no eggs unless prepared in a food; peanut butter but not fond of it Vegetables: carrots if in combination dishes; broccoli, celery if in soup; onion if in soup or in combination dishes; zucchini if in stir fry  Fruits: apples; was drinking juice but has reduced it. Still includes small amount of apple juice.  Dairy: cheese in and on foods but not alone; chocolate milk; ice cream Sauces/Dips/Spreads: BBQ sauce; A1; cooks with Ketchup but not otherwise Beverages: water; chocolate milk; juices Other: loves chicken soup with vegetables   Individualized Plan for Diabetes Self-Management Training:   Learning Objective:  Patient will have a greater understanding of diabetes self-management. Patient education plan is to attend individual and/or group sessions per assessed needs and concerns.   Plan: Dietitian discussed importance of consistent intake, especially with starting insulin. Encouraged pt to call if any questions once she starts insulin. If pt has questions once starting insulin will consider referring to CDE dietitian. Discussed meal planning for balanced meals with 2-3 carbohydrate choices per meal which include foods pt is comfortable with eating and preparing. Included multiple meals on meal ideas sheet. Reviewed carbodyrate counting. Discussed that while chocolate milk is higher in sugar than regular milk and not recommended, it is preferred over juice. Discussed strategies for expanding diet/trying new foods. Pt appeared agreeable to information/goals discussed.   Instructions/Goals:   Continued Goal: Eat every 3-5 hours. Include protein and  carbohydrate each time you eat. Try to have balanced meals like the plate.   Recommend 2-3 carbohydrate choices per meal (30-45 g per meal). For nutrition labels: look under "total carbohydrate" and divide by  15 to see how many carb choices it contains.   Recommend using meal ideas plan to plan out balanced meals  -Log evening foods to assess effects on fasting blood sugar the next morning. Continue checking fasting blood sugar and 2 times after meals each day (1-2 hours after start of a meal). Follow doctor recommendations regarding checking blood sugar for insulin.   -Continue working to have at least 48-64 oz water daily   -Recommend trying carbonated water with fruit essence but sugar free   Foods to try to expand diet:  Frozen blueberries Pear (recommend refrigerated to help with texture)  Greek yogurt Whole grain pasta quinoa   Patient Instructions  Instructions/Goals:   Continued Goal: Eat every 3-5 hours. Include protein and carbohydrate each time you eat. Try to have balanced meals like the plate.   Recommend 2-3 carbohydrate choices per meal (30-45 g per meal). For nutrition labels: look under "total carbohydrate" and divide by 15 to see how many carb choices it contains.   Recommend using meal ideas plan to plan out balanced meals  -Log evening foods to assess effects on fasting blood sugar the next morning. Continue checking fasting blood sugar and 2 times after meals each day (1-2 hours after start of a meal). Follow doctor recommendations regarding checking blood sugar for insulin.   -Continue working to have at least 48-64 oz water daily   -Recommend trying carbonated water with fruit essence but sugar free   Foods to try to expand diet:  Frozen blueberries Pear (recommend refrigerated to help with texture)  Greek yogurt Whole grain pasta quinoa       Education material provided: None.   If problems or questions, patient to contact team via:  Phone and Email  Future DSME appointment:  3 weeks.

## 2019-05-27 NOTE — Patient Instructions (Addendum)
Instructions/Goals:   Continued Goal: Eat every 3-5 hours. Include protein and carbohydrate each time you eat. Try to have balanced meals like the plate.   Recommend 2-3 carbohydrate choices per meal (30-45 g per meal). For nutrition labels: look under "total carbohydrate" and divide by 15 to see how many carb choices it contains.   Recommend using meal ideas plan to plan out balanced meals  -Log evening foods to assess effects on fasting blood sugar the next morning. Continue checking fasting blood sugar and 2 times after meals each day (1-2 hours after start of a meal). Follow doctor recommendations regarding checking blood sugar for insulin.   -Continue working to have at least 48-64 oz water daily   -Recommend trying carbonated water with fruit essence but sugar free   Foods to try to expand diet:  Frozen blueberries Pear (recommend refrigerated to help with texture)  Greek yogurt Whole grain pasta quinoa

## 2019-06-02 ENCOUNTER — Encounter: Payer: Self-pay | Admitting: Registered"

## 2019-06-07 ENCOUNTER — Other Ambulatory Visit (HOSPITAL_COMMUNITY): Payer: Self-pay | Admitting: Internal Medicine

## 2019-06-15 ENCOUNTER — Telehealth (HOSPITAL_COMMUNITY): Payer: Self-pay

## 2019-06-15 NOTE — Telephone Encounter (Signed)
Received a Request from [employer] Requesting more consult notes because the Patients insurance denied due to lack of medical necessity.

## 2019-06-17 ENCOUNTER — Other Ambulatory Visit: Payer: Self-pay

## 2019-06-17 ENCOUNTER — Ambulatory Visit (HOSPITAL_COMMUNITY)
Admission: RE | Admit: 2019-06-17 | Discharge: 2019-06-17 | Disposition: A | Discharge status: Home / Self Care | Payer: BC Managed Care – PPO | Source: Ambulatory Visit | Attending: Internal Medicine | Admitting: Internal Medicine

## 2019-06-17 DIAGNOSIS — I071 Rheumatic tricuspid insufficiency: Secondary | ICD-10-CM

## 2019-06-17 DIAGNOSIS — I50810 Right heart failure, unspecified: Secondary | ICD-10-CM | POA: Diagnosis not present

## 2019-06-17 DIAGNOSIS — I272 Pulmonary hypertension, unspecified: Secondary | ICD-10-CM | POA: Diagnosis not present

## 2019-06-17 DIAGNOSIS — M329 Systemic lupus erythematosus, unspecified: Secondary | ICD-10-CM

## 2019-06-17 MED FILL — CYCLOPHOSPHAMIDE 50 MG CAPS: 50 | 30 days supply | Qty: 30 | Fill #0

## 2019-06-17 NOTE — Addendum Note (Signed)
Encounter addended by: Kerry Dory, CMA on: 06/17/2019 3:34 PM  Actions taken: Clinical Note Signed

## 2019-06-17 NOTE — Progress Notes (Signed)
Virtual 1 month chf followup scheduled 07/27/19 Pt aware and AVS mailed

## 2019-06-17 NOTE — Progress Notes (Signed)
Heart Failure TeleHealth Note  Due to national recommendations of social distancing due to [place] 19, Audio/video telehealth visit is felt to be most appropriate for this patient at this time.  See MyChart message from today for patient consent regarding telehealth for [name].  Date:  06/17/2019   ID:  [name], DOB 1971/04/05, MRN [id number]  Location: Home  Provider location: [facility] Type of Visit: Established patient  PCP:  [name], MD  Cardiologist:  No primary care provider on file. 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   Seen by Dr. Martinique 06/16/18 with complaints of worsening LE edema since July. Worse towards the end of the day. She was started on lasix previously without much improvement. She has h/o the same due to her nephritis, but protein levels have been stable. She follows with Dr. Amil Amen with Rheumatology and Dr. Moshe Cipro with Nephrology. Sent for Echo as below which showed pulmonary HTN, likely due to her Lupus.   Echo 06/17/18 LVEF 65-70%, Mild MR, Severe RAE, Severe TR, Trivial PI, PA peak pressure 68 mm/Hg, trivial pericardial effusion.  We saw her for the first timein the pulmonary HTN clinic in 12/19.She subsequently underwent R/LHC in 12/19 which sowed normal coronary arteries with mild PAH 43/18 (26) but normal PVR and high output.  Had echo 2/20. EF 55-60% RV mildly dilated. Function ok. Septum flat. Severe TR, RVSP measured at 85mmHG. Bubble negative  Right heart cath repeated in 6/20 due to persistent RV strain on echo. RHC without evidence of PAH.   Hi-res CT 03/24/19: No ILD VQ study (94/20): No PE. Changes c/w PHTN   cMRI 05/22/19 1. Moderate RVE with decreased  RVEF 40% 2.  Normal LV size and function LVEF 62% 3.  Moderate LAE and Severe RAE 4.  Severe appearing TR 5.  Mild appearing MR 6. Moderate circumferential pericardial effusion with dilated IVC2.4 cm no diastolic RV collapse 7.  NO ASD/PFO/VSD   She presents via Engineer, civil (consulting) for a telehealth visit today due to COVID-19 pandemic. At last visit we restarted lasix for volume overload. Recently had low blood counts and saw Dr. Moshe Cipro who was concerned about lupus flare. Started prednisone. About to start cytoxan. CBGs went up to 500. Saw Dr. Debbora Presto in Endo and started on insulin. Says she is not feeling very well. Weight much improved with lasix and spiro. Down 20 pounds 175 -> 155. No edema, orthopnea or PND.     Other cardiac studies:  RHC 01/02/19 RA = 5 RV = 34/7 PA = 34/6 (20) PCW = 8 Fick cardiac output/index = 5.9/3.1 PVR = 2.0 WU Ao sat = 100% PA sat = 71%, 72% SVC sat = 66%   PFTs 02/03/19 FEV1 1.29 (47%) FVC 2.00 (58%) Ratio 64%  DLCO 45%  RHC 12/19  Ao = 115/73 (92) LV = 116/12 RA = 8 RV = 39/10 PA = 43/18 (26) PCW = 13 Fick cardiac output/index = 8.7/4.5 PVR = 1.5 WU FA sat = 99% PA sat = 73%, 75% SVC sat = 79%       [name] denies symptoms worrisome for COVID 19.   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   Past Surgical History:  Procedure Laterality Date   bilat hip grafts     2007   BUNIONECTOMY  2018   HIP SURGERY     x2   RIGHT HEART CATH N/A 01/02/2019   Procedure: RIGHT HEART CATH;  Surgeon: Jolaine Artist, MD;  Location: [facility];  Service: Cardiovascular;  Laterality: N/A;   RIGHT/LEFT HEART CATH AND CORONARY ANGIOGRAPHY N/A 07/11/2018   Procedure: RIGHT/LEFT HEART CATH AND CORONARY ANGIOGRAPHY;  Surgeon: Jolaine Artist, MD;  Location: [facility];  Service: Cardiovascular;  Laterality: N/A;   TOTAL HIP ARTHROPLASTY  08/2016 and 99991111   x2   UMBILICAL HERNIA REPAIR       Current Outpatient Medications  Medication Sig Dispense Refill   acetaminophen (TYLENOL) 500 MG tablet Take 500-1,000 mg by mouth every 8 (eight) hours as needed (pain).      albuterol (VENTOLIN HFA) 108 (90 Base) MCG/ACT inhaler Inhale 2 puffs into the lungs every 4 (four) hours as needed for wheezing or shortness of breath.      Ca Carbonate-Mag Hydroxide (ROLAIDS PO) Take 2 tablets by mouth daily as needed (acid reflux).     empagliflozin (JARDIANCE) 10 MG TABS tablet Take 10 mg by mouth daily.     furosemide (LASIX) 40 MG tablet Take 1 tablet (40 mg total) by mouth 3 (three) times a week. Every Monday, Wednesday and Friday 15 tablet 6   hydroxychloroquine (PLAQUENIL) 200 MG tablet Take 400 mg by mouth daily.      ibuprofen (ADVIL,MOTRIN) 200 MG tablet Take 200-400 mg by mouth every 8 (eight) hours as needed (pain).      levothyroxine (SYNTHROID) 25 MCG tablet Take 25 mcg by mouth daily before breakfast.     losartan (COZAAR) 50 MG tablet Take 50 mg by mouth daily.     potassium chloride SA (KLOR-CON) 20 MEQ tablet Take 1 tablet (20 mEq total) by mouth 3 (three) times a week. Every Monday, Wednesday, and Friday 15 tablet 6   predniSONE (DELTASONE) 20 MG tablet Take 20 mg by mouth.     spironolactone (ALDACTONE) 25 MG tablet TAKE (1/2) TABLET DAILY. 15 tablet 0   No current facility-administered medications for this encounter.     Allergies:   Lisinopril and Penicillins   Social History:  The patient  reports that she has never smoked. She has never used smokeless tobacco. She reports that she does not drink alcohol or use drugs.   Family History:  The patient's family history includes Anemia in her father; Breast cancer in her maternal grandmother and mother; Cancer in her maternal grandfather, maternal  grandmother, and mother; Clotting disorder in her father; Heart disease in her mother; Hypertension in her father; Sleep apnea in her father.   ROS:  Please see the history of present illness.   All other systems are personally reviewed and negative.   Exam:  (Video/Tele Health Call; Exam is subjective and or/visual.) General:  Speaks in full sentences. No resp difficulty. Lungs: Normal respiratory effort with conversation.  Abdomen: Non-distended per patient report Extremities: Pt denies edema. Neuro: Alert & oriented x 3.   Recent Labs: 12/30/2018: Platelets 104 01/02/2019: Hemoglobin 7.8 04/15/2019: B Natriuretic Peptide 248.7; BUN 17; Creatinine, Ser 1.23; Potassium 3.8; Sodium 143  Personally reviewed   Wt Readings from Last 3 Encounters:  04/15/19  79.7 kg (175 lb 12.8 oz)  04/09/19 78 kg (172 lb)  03/03/19 77 kg (169 lb 12.8 oz)      ASSESSMENT AND PLAN:  1. Pulmonary HTN with severe TR - Echo 06/17/18 LVEF 65-70%, Mild MR, Severe RAE, Severe TR, Trivial PI, PA peak pressure 68 mm/Hg, trivial pericardial effusion.  -Echo suggestive of WHO Group I PAH with RV strain and severe TR but RHC x 2 without significant PAH - Bubble study negative for shunt - cMRI 11/20 with RVEF 40% severe TR. Moderate effusion. No LGE or other RV pathology.  - PFTs with significant obstruction/restriction with markedly decreased DLCO - 6MW 03/03/19 without hypoxemia - Hi-res CT and VQ normal but showed evidence of PAH - It is hard for me to explain discrepancy between echo and RHC x 2. Echo suggests significant PAH with RV strain. Initial RHC with mildly elevated PA pressures in the setting of elevated CO with normal PVR. F/u RHC showed PA mean 20 with normal output. RA pressures have been normal. Ancillary studies also suggest presence of PAH.  - In setting of SLE I think the burden is upon [place] to prove that she does NOT truly have PAH. I think she will likely need RHC with exercise looking for  exercise-induced PAH. Will schedule this after lupus flare settles down  - I discussed case with Dr. Pilar Plate at [facility] and will also consider CT angio chest to further evaluate for shunt physiology though bubble study negative - Sleep study pending (denied by insurance) now appealing.  - Will review TR with structural team. ? TV clip  2. Lupus with history of Nephritis - Followed by Dr. Amil Amen and Dr. Moshe Cipro (Nephrology) - Now seems to be having a flare. Treated by Dr. Moshe Cipro  3. HTN -Labile but improved  4. Severe tricuspid regurgitation  - likely secondary to dilated RV and poor leaflet coaptation. That said currently unclear why RV so dilated in setting of normal PA pressures and no exertional hypoxemia - unclear if this is primary or secondary. (I suspect secondary)  - Plan as above. Can get TEE as needed  5. DM2 - Sugars are still very labile. Managed by Dr. Debbora Presto   6. Snoring and fatigue - check sleep study  COVID screen The patient does not have any symptoms that suggest any further testing/ screening at this time.  Social distancing reinforced today.  Recommended follow-up:  As above  Relevant cardiac medications were reviewed at length with the patient today.   The patient does not have concerns regarding their medications at this time.   The following changes were made today:  As above  Today, I have spent 22 minutes with the patient with telehealth technology discussing the above issues .    Signed, Glori Bickers, MD  06/17/2019 2:50 PM  [facility] [place] [address] Heart and [place] [address] [phone] (office) [phone] (fax)

## 2019-06-17 NOTE — Patient Instructions (Signed)
It was great to speaking you today! No medication changes are needed at this time.  Your physician recommends that you schedule a follow-up appointment in: 1 month with Dr Haroldine Laws (virtual)  Do the following things EVERYDAY: 1) Weigh yourself in the morning before breakfast. Write it down and keep it in a log. 2) Take your medicines as prescribed 3) Eat low salt foods-Limit salt (sodium) to 2000 mg per day.  4) Stay as active as you can everyday 5) Limit all fluids for the day to less than 2 liters  At the [facility], you and your health needs are our priority. As part of our continuing mission to provide you with exceptional heart care, we have created designated Provider Care Teams. These Care Teams include your primary Cardiologist (physician) and Advanced Practice Providers (APPs- Physician Assistants and Nurse Practitioners) who all work together to provide you with the care you need, when you need it.   You may see any of the following providers on your designated Care Team at your next follow up: Marland Kitchen Dr Glori Bickers . Dr Loralie Champagne . Darrick Grinder, NP . Lyda Jester, PA . Audry Riles, PharmD   Please be sure to bring in all your medications bottles to every appointment.

## 2019-06-23 ENCOUNTER — Telehealth (HOSPITAL_COMMUNITY): Payer: Self-pay

## 2019-06-23 NOTE — Telephone Encounter (Signed)
Received a fax from [employer] stating that they received consult notes from 04/15/19 but the notes did not include any sleep symptoms. I faxed over a few more ov notes from prior visits as well as the last ov note to [phone] on 06/23/19.

## 2019-06-24 ENCOUNTER — Other Ambulatory Visit: Payer: Self-pay | Admitting: Endocrinology

## 2019-06-24 DIAGNOSIS — E162 Hypoglycemia, unspecified: Secondary | ICD-10-CM

## 2019-06-25 ENCOUNTER — Ambulatory Visit: Payer: BC Managed Care – PPO | Admitting: Registered"

## 2019-07-06 ENCOUNTER — Other Ambulatory Visit (HOSPITAL_COMMUNITY): Payer: Self-pay | Admitting: Internal Medicine

## 2019-07-08 ENCOUNTER — Telehealth (HOSPITAL_COMMUNITY): Payer: Self-pay

## 2019-07-08 ENCOUNTER — Other Ambulatory Visit (HOSPITAL_COMMUNITY): Payer: Self-pay

## 2019-07-08 NOTE — Telephone Encounter (Signed)
Called Patient to speak with her about her [place] Sleep Study. [employer] sent me a message stating that her insurance denied the study and I asked her if she wanted to go do the study overnight and she stated not at this time. Removed order from chart.

## 2019-07-15 ENCOUNTER — Ambulatory Visit
Admission: RE | Admit: 2019-07-15 | Discharge: 2019-07-15 | Disposition: A | Discharge status: Home / Self Care | Payer: BC Managed Care – PPO | Source: Ambulatory Visit | Attending: Endocrinology | Admitting: Endocrinology

## 2019-07-15 ENCOUNTER — Ambulatory Visit: Payer: BC Managed Care – PPO | Admitting: Registered"

## 2019-07-15 ENCOUNTER — Encounter

## 2019-07-15 ENCOUNTER — Other Ambulatory Visit: Payer: Self-pay

## 2019-07-15 DIAGNOSIS — E162 Hypoglycemia, unspecified: Secondary | ICD-10-CM

## 2019-07-15 MED ORDER — GADOBENATE DIMEGLUMINE 529 MG/ML IV SOLN
16.0000 mL | Freq: Once | INTRAVENOUS | Status: AC | PRN
  Administered 2019-07-15: 16 mL via INTRAVENOUS

## 2019-07-16 ENCOUNTER — Encounter: Payer: Self-pay | Admitting: Registered"

## 2019-07-16 ENCOUNTER — Encounter: Payer: BC Managed Care – PPO | Attending: Family Medicine | Admitting: Registered"

## 2019-07-16 DIAGNOSIS — E119 Type 2 diabetes mellitus without complications: Secondary | ICD-10-CM | POA: Insufficient documentation

## 2019-07-16 MED FILL — CYCLOPHOSPHAMIDE 50 MG CAPS: 50 | 30 days supply | Qty: 30 | Fill #1

## 2019-07-16 MED FILL — FREESTYLE LIBRE 14 DAY SENS: 28 days supply | Qty: 2 | Fill #0

## 2019-07-16 NOTE — Patient Instructions (Addendum)
Instructions/Goals:   Continued Goal: Eat every 3-5 hours. Include protein and carbohydrate each time you eat. Try to have balanced meals like the plate.   Recommend 2-3 carbohydrate choices per meal (30-45 g per meal).   -Log evening foods to assess effects on fasting blood sugar the next morning.  -Continue working to have at least 48-64 oz water daily   -When having a low (70 or less) we must treat with fast acting carbohydrate: 4 oz juice or reg soda, 1 tbsp sugar, honey, syrup  -We MUST treat a low blood sugar before leaving for work. Think of it as more important than changing your clothes, brushing your teeth, etc as it is vital  -Recommend a Boost High Protein recommend having for breakfast   -Have a balanced snack within 2 hours of bedtime (see sheet)

## 2019-07-16 NOTE — Progress Notes (Signed)
Diabetes Self-Management Education  Visit Type:    Appt. Start Time: 1040 Appt. End Time: 1120  07/16/2019  [name], identified by name and date of birth, is a 48 y.o. female with a diagnosis of Diabetes:    ASSESSMENT  Last menstrual period 04/02/2017. There is no height or weight on file to calculate BMI.    Nutrition Follow-Up:    Pt reports since last appointment she completed her dose of prednisone and is no longer on insulin but on Metformin and Cytoxan. Pt reports since last visit she had tried two different insulins while taking prednisone. Was taking 4 shots of insulin, morning and before each meal. Reports she has finished the prednisone and is no longer taking the insulin. Reports she can tell she feels better since blood sugar has come down from what it was. Reports she didn't feel well right after coming off the insulin. Pt has been off of insulin for 1.5-2 weeks. Pt reports having a little shakiness when having lows recently. Reports having vision changes lately. Pt last went to eye doctor in September. Reports she is going to go back if vision issue continues. Feels it may be due to increased screen time but unsure. 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 Pt reports he CGM sensor broke, she is getting a new one on Monday. Has been manually checking.   Pt reports trying to shop and plan meals better. Reports holidays have been more challenging to keep on plan. Reports still struggling with eating consistently. Pt continues to have very low fastings. Pt reports she is still having a hard time eating consistently. Reports concern about going back to work next week. Reports she knows she should not drive to work while having a low but feels she has done that in the past. Reports concerns about timing in the morning to treat low and also having to wait 30 minutes after taking her Synthroid medication. Pt gets up around 615-630. Needs to leave for  work between 630-645 AM. Reports she could get there a little later if needed. Pt works for [employer].   Pt reports her doctor has recommended she get up during the night and drink a protein shake but she does not want to do that because she is afraid it will interfere with her sleep. Pt has purchased some chocolate Premier Protein drinks but has not yet tried them. Pt is often to having a Boost High Protein drink for breakfast and adding a balanced snack before bed. She reports feels the late snack would work best. Reports she used to worry about weight but not now and knows she probably needs to gain some weight. Pt reports 150 lb weight at home today. Last weight in chart shows 175 lb 12 oz on 09/30, weight loss of ~26 lb over past 3 months.   Pt reports husband had a physical and needs to lose weight. Husband will see colleague [name] next month. Pt wants to know if she and husband could have some appointments together.  Over Past Week:  Fastings: 39, 40 yesterday, 40 12/29, 111 12/27 Postprandial: 217 (2.5 hours after meal), reports most around 200  24-Hour Recall: Not provided today.   Preferred/Accepted Foods:  Grains/Starches: most grains-likes whole grain breads; brown rice; willing to try quinoa; potatoes, pinto beans if in chili only; dry cereal: golden grahams with milk Proteins: [place], chicken, beef, pork, doesn't care for seafood; pintos but only in  chili; no eggs unless prepared in a food; peanut butter but not fond of it Vegetables: carrots if in combination dishes; broccoli, celery if in soup; onion if in soup or in combination dishes; zucchini if in stir fry  Fruits: apples; was drinking juice but has reduced it. Still includes small amount of apple juice.  Dairy: cheese in and on foods but not alone; chocolate milk; ice cream Sauces/Dips/Spreads: BBQ sauce; A1; cooks with Ketchup but not otherwise Beverages: water; chocolate milk; juices Other: loves chicken soup with  vegetables   Individualized Plan for Diabetes Self-Management Training:   Learning Objective:  Patient will have a greater understanding of diabetes self-management. Patient education plan is to attend individual and/or group sessions per assessed needs and concerns.   Plan: Dietitian discussed importance of treating lows before leaving for work. Discussed viewing treating a low before heading to work as essential just as pt would not go to work without changing clothes or putting on shoes, etc. Discussed that treating the low is vital and could lead to very dangerous outcome otherwise (fainting, car accident, etc). Reviewed treatment for hypoglycemia and recommended having a Boost High Protein drink before work for breakfast to maintain blood sugar. Discussed that Premier protein does not supply carbohydrates and thus would not help with lows. Discussed that pt and husband could have back to back appointments in future possibly and also that they are welcome to attend each others appointments in future if desired. Discussed weight loss. Will monitor this. Discussed adding a balanced snack before bed to help with lows in the morning. Pt appeared agreeable to information/goals discussed.   Instructions/Goals:   Continued Goal: Eat every 3-5 hours. Include protein and carbohydrate each time you eat. Try to have balanced meals like the plate.   Recommend 2-3 carbohydrate choices per meal (30-45 g per meal).   -Log evening foods to assess effects on fasting blood sugar the next morning.  -Continue working to have at least 48-64 oz water daily   -When having a low (70 or less) we must treat with fast acting carbohydrate: 4 oz juice or reg soda, 1 tbsp sugar, honey, syrup  -We MUST treat a low blood sugar before leaving for work. Think of it as more important than changing your clothes, brushing your teeth, etc as it is vital  -Recommend a Boost High Protein recommend having for breakfast    -Have a balanced snack within 2 hours of bedtime (see sheet)  Patient Instructions  Instructions/Goals:   Continued Goal: Eat every 3-5 hours. Include protein and carbohydrate each time you eat. Try to have balanced meals like the plate.   Recommend 2-3 carbohydrate choices per meal (30-45 g per meal).   -Log evening foods to assess effects on fasting blood sugar the next morning.  -Continue working to have at least 48-64 oz water daily   -When having a low (70 or less) we must treat with fast acting carbohydrate: 4 oz juice or reg soda, 1 tbsp sugar, honey, syrup  -We MUST treat a low blood sugar before leaving for work. Think of it as more important than changing your clothes, brushing your teeth, etc as it is vital  -Recommend a Boost High Protein recommend having for breakfast   -Have a balanced snack within 2 hours of bedtime (see sheet)   Education material provided: None.   If problems or questions, patient to contact team via:  Phone and Email. Provider to check in with pt next week.  Future DSME appointment:  2 weeks.

## 2019-07-23 ENCOUNTER — Telehealth: Payer: Self-pay | Admitting: Registered"

## 2019-07-23 ENCOUNTER — Encounter: Payer: Self-pay | Admitting: Registered"

## 2019-07-23 NOTE — Telephone Encounter (Signed)
Dietitian called pt to follow-up from appointment last week.   Pt reports she has been doing better with planning meals for the week and has been having real meals rather than "[employer] meals." Pt reports she is still having lows in the morning but some have been a little higher than before on mornings she had a snack the night before. Pt reports evening snacks have included cookies or pretzels. Pt reports she has been trying to treat her low blood sugars in the morning before leaving for work and will get it up some, but they have not been reaching above 70 before leaving. Reports she has been using glucose tablets to treat lows but has been taking 1 tablet which pt reports contains 4 g CHO per tablet (pt checked while on the phone). She reports she will then eat something for breakfast on the way to work. Pt reports she had lab work done over past week and her HgbA1c was higher than last time, she thinks around 10. Pt has an appointment with her MD next week per pt.   Dietitian praised pt's progress in starting to implement recommendations discussed at last appointment (having an evening snack, meal planning, taking fast acting glucose when having a low and eating breakfast). Discussed that pt will want to take 4 of the glucose tablets which contain 4 g CHO per tablet to ensure she treats lows with ~15 g CHO to bring it up to above 70. Discussed then checking in 15 minutes and repeating that process until blood sugar is above 70. Discussed importance of reaching above 70 before leaving for work and then having breakfast soon after. Discussed adding protein to evening carbohydrate snack to see if that further improves morning blood sugar. Discussed keeping track of evening snack and morning blood sugar and encouraged pt to email over this information to dietitian before next appointment, especially if morning lows continue. Dietitian discussed that pt's A1C was likely much higher than previous reading due to  period when pt started prednisone and was having some blood sugars in the 5000-400s as HgbA1c will show an average over past 2-3 months.   Pt did not have any further questions or concerns.   Dietitian emailed the following notes to pt after phone call:    -Treat blood sugar below 70 with 4 glucose tablets (if they each contain 4 g carbohydrates).  . Want to take in ~15 g carbohydrates for a low, then wait 15 minutes and check blood sugar again.  . If still below 70, want to repeat process-take 4 more tablets (if containing 4 g carbohydrate per tablet) and then recheck blood sugar after 15 minutes. Repeat until above 70. If blood sugar will not come up after multiple tries of taking in 15 g carbohydrates each time, will want to call 911 . Want to get blood sugar above 70 before you do anything else or leave the house.  . Once blood sugar is back above 70, want to eat a snack or breakfast containing carbohydrates and protein (or could do the High Protein Boost drink) within 30 minutes. Can eat some then and rest on way to work.   -Continue having a snack at night and write down what, how much, and time you have snack and what your fasting blood sugar is the next day. Could take a picture of snack instead of writing it down if easier. This information can help [place] see how different snacks affect your blood sugar and  make changes which can help prevent morning lows.   -Let your MD know next week about having the morning lows again.   You are doing great going ahead and putting into action what we talked about last week. Keep up the progress!   Please feel free to email me with any questions and especially if you continue having the lows in the morning. I am glad to help.

## 2019-07-27 ENCOUNTER — Encounter (HOSPITAL_COMMUNITY): Payer: Self-pay | Admitting: *Deleted

## 2019-07-27 ENCOUNTER — Telehealth (HOSPITAL_COMMUNITY): Payer: Self-pay

## 2019-07-27 ENCOUNTER — Encounter (HOSPITAL_COMMUNITY): Payer: Self-pay

## 2019-07-27 ENCOUNTER — Other Ambulatory Visit: Payer: Self-pay

## 2019-07-27 ENCOUNTER — Ambulatory Visit (HOSPITAL_COMMUNITY)
Admission: RE | Admit: 2019-07-27 | Discharge: 2019-07-27 | Disposition: A | Discharge status: Home / Self Care | Payer: BC Managed Care – PPO | Source: Ambulatory Visit | Attending: Internal Medicine | Admitting: Internal Medicine

## 2019-07-27 DIAGNOSIS — I50810 Right heart failure, unspecified: Secondary | ICD-10-CM

## 2019-07-27 DIAGNOSIS — M329 Systemic lupus erythematosus, unspecified: Secondary | ICD-10-CM

## 2019-07-27 DIAGNOSIS — I071 Rheumatic tricuspid insufficiency: Secondary | ICD-10-CM

## 2019-07-27 NOTE — Progress Notes (Signed)
LM for patient to call office to discuss planned procedure (see telephone encpunter)

## 2019-07-27 NOTE — Telephone Encounter (Signed)
LM for patient to call office to discuss planned procedure TEE

## 2019-07-27 NOTE — Addendum Note (Signed)
Encounter addended by: Valeda Malm, RN on: 07/27/2019 2:31 PM  Actions taken: Clinical Note Signed

## 2019-07-27 NOTE — H&P (View-Only) (Signed)
Heart Failure TeleHealth Note  Due to national recommendations of social distancing due to [place] 19, Audio/video telehealth visit is felt to be most appropriate for this patient at this time.  See MyChart message from today for patient consent regarding telehealth for [facility].  Date:  07/27/2019   ID:  [name], DOB 05/27/1971, MRN [id number]  Location: Home  Provider location: [facility] Type of Visit: Established patient  PCP:  [name], MD  Cardiologist:  No primary care provider on file. 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   Seen by Dr. Martinique 06/16/18 with complaints of worsening LE edema since July. Worse towards the end of the day. She was started on lasix previously without much improvement. She has h/o the same due to her nephritis, but protein levels have been stable. She follows with Dr. Amil Amen with Rheumatology and Dr. Moshe Cipro with Nephrology. Sent for Echo as below which showed pulmonary HTN, likely due to her Lupus.   Echo 06/17/18 LVEF 65-70%, Mild MR, Severe RAE, Severe TR, Trivial PI, PA peak pressure 68 mm/Hg, trivial pericardial effusion.  We saw her for the first timein the pulmonary HTN clinic in 12/19.She subsequently underwent R/LHC in 12/19 which sowed normal coronary arteries with mild PAH 43/18 (26) but normal PVR and high output.  Had echo 2/20. EF 55-60% RV mildly dilated. Function ok. Septum flat. Severe TR, RVSP measured at 30mmHG. Bubble negative  Right heart cath repeated in 6/20 due to persistent RV strain on echo. RHC without evidence of PAH.   Hi-res CT 03/24/19: No ILD VQ study (94/20): No PE. Changes c/w PHTN   cMRI 05/22/19 1. Moderate RVE with decreased  RVEF 40% 2.  Normal LV size and function LVEF 62% 3.  Moderate LAE and Severe RAE 4.  Severe appearing TR 5.  Mild appearing MR 6. Moderate circumferential pericardial effusion with dilated IVC2.4 cm no diastolic RV collapse 7.  NO ASD/PFO/VSD   She presents via [employer], civil (consulting) for a telehealth visit today due to COVID-19 pandemic. Recently had SLE flare. Was treated with prednisone (stopped in December) and now switched to cytoxan 50mg  daily. Feeling much better. Seeing Dr. Debbora Presto in Endo and now off insulin. Taking lasix 40 qod and spiro 12.5. Weight down 148-152 (previosuly was 155). Very mild edema. Mild exertional exertional dyspnea. No orthopnea or PND.    Other cardiac studies:  RHC 01/02/19 RA = 5 RV = 34/7 PA = 34/6 (20) PCW = 8 Fick cardiac output/index = 5.9/3.1 PVR = 2.0 WU Ao sat = 100% PA sat = 71%, 72% SVC sat = 66%   PFTs 02/03/19 FEV1 1.29 (47%) FVC 2.00 (58%) Ratio 64%  DLCO 45%  RHC 12/19  Ao = 115/73 (92) LV = 116/12 RA = 8 RV = 39/10 PA = 43/18 (26) PCW = 13 Fick cardiac output/index = 8.7/4.5 PVR = 1.5 WU FA sat = 99% PA sat = 73%, 75% SVC sat = 79%       [name] denies symptoms worrisome for COVID 19.   Past Medical History:  Diagnosis Date  . Avascular necrosis ([place])   . Hypertension   . Lupus ([place])   . Lupus nephritis ([place])   . Prediabetes 10/2011  . 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 bilat hip grafts     2007  . BUNIONECTOMY  2018  . HIP SURGERY     x2  . RIGHT HEART CATH N/A 01/02/2019   Procedure: RIGHT HEART CATH;  Surgeon: Jolaine Artist, MD;  Location: [facility];  Service: Cardiovascular;  Laterality: N/A;  . RIGHT/LEFT HEART CATH AND CORONARY ANGIOGRAPHY N/A 07/11/2018   Procedure: RIGHT/LEFT HEART CATH AND CORONARY ANGIOGRAPHY;  Surgeon: Jolaine Artist, MD;  Location: [facility];  Service:  Cardiovascular;  Laterality: N/A;  . TOTAL HIP ARTHROPLASTY  08/2016 and 10/2016   x2  . UMBILICAL HERNIA REPAIR       Current Outpatient Medications  Medication Sig Dispense Refill  . acetaminophen (TYLENOL) 500 MG tablet Take 500-1,000 mg by mouth every 8 (eight) hours as needed (pain).     Marland Kitchen albuterol (VENTOLIN HFA) 108 (90 Base) MCG/ACT inhaler Inhale 2 puffs into the lungs every 4 (four) hours as needed for wheezing or shortness of breath.     . Ca Carbonate-Mag Hydroxide (ROLAIDS PO) Take 2 tablets by mouth daily as needed (acid reflux).    . Cyclophosphamide (CYTOXAN PO) Take by mouth.    . empagliflozin (JARDIANCE) 10 MG TABS tablet Take 10 mg by mouth daily.    . furosemide (LASIX) 40 MG tablet Take 1 tablet (40 mg total) by mouth 3 (three) times a week. Every Monday, Wednesday and Friday 15 tablet 6  . hydroxychloroquine (PLAQUENIL) 200 MG tablet Take 400 mg by mouth daily.     Marland Kitchen ibuprofen (ADVIL,MOTRIN) 200 MG tablet Take 200-400 mg by mouth every 8 (eight) hours as needed (pain).     Marland Kitchen levothyroxine (SYNTHROID) 25 MCG tablet Take 25 mcg by mouth daily before breakfast.    . losartan (COZAAR) 50 MG tablet Take 50 mg by mouth daily.    . metFORMIN (GLUCOPHAGE) 500 MG tablet Take by mouth 2 (two) times daily with a meal.    . potassium chloride SA (KLOR-CON) 20 MEQ tablet Take 1 tablet (20 mEq total) by mouth 3 (three) times a week. Every Monday, Wednesday, and Friday 15 tablet 6  . predniSONE (DELTASONE) 20 MG tablet Take 20 mg by mouth.    . spironolactone (ALDACTONE) 25 MG tablet TAKE (1/2) TABLET DAILY. 15 tablet 0   No current facility-administered medications for this encounter.    Allergies:   Lisinopril and Penicillins   Social History:  The patient  reports that she has never smoked. She has never used smokeless tobacco. She reports that she does not drink alcohol or use drugs.   Family History:  The patient's family history includes Anemia in her father; Breast  cancer in her maternal grandmother and mother; Cancer in her maternal grandfather, maternal grandmother, and mother; Clotting disorder in her father; Heart disease in her mother; Hypertension in her father; Sleep apnea in her father.   ROS:  Please see the history of present illness.   All other systems are personally reviewed and negative.   Exam:  (Video/Tele Health Call; Exam is subjective and or/visual.) General:  Speaks in full sentences. No resp difficulty. Lungs: Normal respiratory effort with conversation.  Abdomen: Non-distended per patient report Extremities: patient reports trace edema. Neuro: Alert & oriented x 3.   Recent Labs: 12/30/2018: Platelets 104 01/02/2019: Hemoglobin 7.8 04/15/2019: B Natriuretic Peptide 248.7; BUN 17; Creatinine, Ser 1.23; Potassium 3.8; Sodium 143  Personally reviewed   Wt Readings from Last 3 Encounters:  04/15/19 79.7 kg (175 lb 12.8 oz)  04/09/19 78 kg (172 lb)  03/03/19 77 kg (169 lb 12.8 oz)      ASSESSMENT AND PLAN:  1. Pulmonary HTN with severe TR - Echo 06/17/18 LVEF 65-70%, Mild MR, Severe RAE, Severe TR, Trivial PI, PA peak pressure 68 mm/Hg, trivial pericardial effusion.  -Echo suggestive of WHO Group I PAH with RV strain and severe TR but RHC x 2 without significant PAH - Bubble study negative for shunt - cMRI 11/20 with RVEF 40% severe TR. Moderate effusion. No LGE or other RV pathology.  - PFTs with significant obstruction/restriction with markedly decreased DLCO - 6MW 03/03/19 without hypoxemia - Hi-res CT and VQ normal but showed evidence of PAH - cMRI 11/20  1. Moderate RVE with decreased RVEF 40% 2.  Normal LV size and function LVEF 62% 3.  Severe TR 4. Moderate circumferential pericardial effusion - RHC x 2 with minimal PAH and normal PVR - Reviewed with structural heart team. Will get TEE and refer to Dr. Roxy Manns for possible TVR  2. Lupus with history of Nephritis - Followed by Dr. Amil Amen and Dr. Moshe Cipro  (Nephrology) - Recent flare. Now improved with cytoxan  3. HTN -Labile but improved  4. Severe tricuspid regurgitation  - likely secondary to dilated RV and poor leaflet coaptation. That said currently unclear why RV so dilated in setting of normal PA pressures and no exertional hypoxemia - unclear if this is primary or secondary. (I suspect secondary)  - Plan as above. Will schedule TEE and refer to Dr. Roxy Manns   5. DM2 - Sugars are still very labile. Managed by Dr. Debbora Presto   6. Snoring and fatigue - insurance refused at home and in-lab PSG.   7. Pericardial effusion - moderate no tamponade - continue to follow  COVID screen The patient does not have any symptoms that suggest any further testing/ screening at this time.  Social distancing reinforced today.  Recommended follow-up:  As above  Relevant cardiac medications were reviewed at length with the patient today.   The patient does not have concerns regarding their medications at this time.   The following changes were made today:  As above  Today, I have spent 24 minutes with the patient with telehealth technology discussing the above issues .    Signed, Glori Bickers, MD  07/27/2019 1:16 PM  Advanced [facility] Failure [place] [id number] [phone] (office) [phone] (fax)

## 2019-07-27 NOTE — Progress Notes (Signed)
Heart Failure TeleHealth Note  Due to national recommendations of social distancing due to [place] 19, Audio/video telehealth visit is felt to be most appropriate for this patient at this time.  See MyChart message from today for patient consent regarding telehealth for [facility].  Date:  07/27/2019   ID:  [name], DOB 1970-10-11, MRN [id number]  Location: Home  Provider location: [facility] Type of Visit: Established patient  PCP:  [name], MD  Cardiologist:  No primary care provider on file. 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   Seen by Dr. Martinique 06/16/18 with complaints of worsening LE edema since July. Worse towards the end of the day. She was started on lasix previously without much improvement. She has h/o the same due to her nephritis, but protein levels have been stable. She follows with Dr. Amil Amen with Rheumatology and Dr. Moshe Cipro with Nephrology. Sent for Echo as below which showed pulmonary HTN, likely due to her Lupus.   Echo 06/17/18 LVEF 65-70%, Mild MR, Severe RAE, Severe TR, Trivial PI, PA peak pressure 68 mm/Hg, trivial pericardial effusion.  We saw her for the first timein the pulmonary HTN clinic in 12/19.She subsequently underwent R/LHC in 12/19 which sowed normal coronary arteries with mild PAH 43/18 (26) but normal PVR and high output.  Had echo 2/20. EF 55-60% RV mildly dilated. Function ok. Septum flat. Severe TR, RVSP measured at 70mmHG. Bubble negative  Right heart cath repeated in 6/20 due to persistent RV strain on echo. RHC without evidence of PAH.   Hi-res CT 03/24/19: No ILD VQ study (94/20): No PE. Changes c/w PHTN   cMRI 05/22/19 1. Moderate RVE with decreased  RVEF 40% 2.  Normal LV size and function LVEF 62% 3.  Moderate LAE and Severe RAE 4.  Severe appearing TR 5.  Mild appearing MR 6. Moderate circumferential pericardial effusion with dilated IVC2.4 cm no diastolic RV collapse 7.  NO ASD/PFO/VSD   She presents via [employer], [employer] (consulting) for a telehealth visit today due to COVID-19 pandemic. Recently had SLE flare. Was treated with prednisone (stopped in December) and now switched to cytoxan 50mg  daily. Feeling much better. Seeing Dr. Debbora Presto in Endo and now off insulin. Taking lasix 40 qod and spiro 12.5. Weight down 148-152 (previosuly was 155). Very mild edema. Mild exertional exertional dyspnea. No orthopnea or PND.    Other cardiac studies:  RHC 01/02/19 RA = 5 RV = 34/7 PA = 34/6 (20) PCW = 8 Fick cardiac output/index = 5.9/3.1 PVR = 2.0 WU Ao sat = 100% PA sat = 71%, 72% SVC sat = 66%   PFTs 02/03/19 FEV1 1.29 (47%) FVC 2.00 (58%) Ratio 64%  DLCO 45%  RHC 12/19  Ao = 115/73 (92) LV = 116/12 RA = 8 RV = 39/10 PA = 43/18 (26) PCW = 13 Fick cardiac output/index = 8.7/4.5 PVR = 1.5 WU FA sat = 99% PA sat = 73%, 75% SVC sat = 79%       [name] denies symptoms worrisome for COVID 19.   Past Medical History:  Diagnosis Date  . Avascular necrosis ([place])   . Hypertension   . Lupus ([place])   . Lupus nephritis ([place])   . Prediabetes 10/2011  . 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 bilat hip grafts     2007  . BUNIONECTOMY  2018  . HIP SURGERY     x2  . RIGHT HEART CATH N/A 01/02/2019   Procedure: RIGHT HEART CATH;  Surgeon: Jolaine Artist, MD;  Location: [place] [facility];  Service: Cardiovascular;  Laterality: N/A;  . RIGHT/LEFT HEART CATH AND CORONARY ANGIOGRAPHY N/A 07/11/2018   Procedure: RIGHT/LEFT HEART CATH AND CORONARY ANGIOGRAPHY;  Surgeon: Jolaine Artist, MD;  Location: [place] [facility];  Service:  Cardiovascular;  Laterality: N/A;  . TOTAL HIP ARTHROPLASTY  08/2016 and 10/2016   x2  . UMBILICAL HERNIA REPAIR       Current Outpatient Medications  Medication Sig Dispense Refill  . acetaminophen (TYLENOL) 500 MG tablet Take 500-1,000 mg by mouth every 8 (eight) hours as needed (pain).     Marland Kitchen albuterol (VENTOLIN HFA) 108 (90 Base) MCG/ACT inhaler Inhale 2 puffs into the lungs every 4 (four) hours as needed for wheezing or shortness of breath.     . Ca Carbonate-Mag Hydroxide (ROLAIDS PO) Take 2 tablets by mouth daily as needed (acid reflux).    . Cyclophosphamide (CYTOXAN PO) Take by mouth.    . empagliflozin (JARDIANCE) 10 MG TABS tablet Take 10 mg by mouth daily.    . furosemide (LASIX) 40 MG tablet Take 1 tablet (40 mg total) by mouth 3 (three) times a week. Every Monday, Wednesday and Friday 15 tablet 6  . hydroxychloroquine (PLAQUENIL) 200 MG tablet Take 400 mg by mouth daily.     Marland Kitchen ibuprofen (ADVIL,MOTRIN) 200 MG tablet Take 200-400 mg by mouth every 8 (eight) hours as needed (pain).     Marland Kitchen levothyroxine (SYNTHROID) 25 MCG tablet Take 25 mcg by mouth daily before breakfast.    . losartan (COZAAR) 50 MG tablet Take 50 mg by mouth daily.    . metFORMIN (GLUCOPHAGE) 500 MG tablet Take by mouth 2 (two) times daily with a meal.    . potassium chloride SA (KLOR-CON) 20 MEQ tablet Take 1 tablet (20 mEq total) by mouth 3 (three) times a week. Every Monday, Wednesday, and Friday 15 tablet 6  . predniSONE (DELTASONE) 20 MG tablet Take 20 mg by mouth.    . spironolactone (ALDACTONE) 25 MG tablet TAKE (1/2) TABLET DAILY. 15 tablet 0   No current facility-administered medications for this encounter.    Allergies:   Lisinopril and Penicillins   Social History:  The patient  reports that she has never smoked. She has never used smokeless tobacco. She reports that she does not drink alcohol or use drugs.   Family History:  The patient's family history includes Anemia in her father; Breast  cancer in her maternal grandmother and mother; Cancer in her maternal grandfather, maternal grandmother, and mother; Clotting disorder in her father; Heart disease in her mother; Hypertension in her father; Sleep apnea in her father.   ROS:  Please see the history of present illness.   All other systems are personally reviewed and negative.   Exam:  (Video/Tele Health Call; Exam is subjective and or/visual.) General:  Speaks in full sentences. No resp difficulty. Lungs: Normal respiratory effort with conversation.  Abdomen: Non-distended per patient report Extremities: patient reports trace edema. Neuro: Alert & oriented x 3.   Recent Labs: 12/30/2018: Platelets 104 01/02/2019: Hemoglobin 7.8 04/15/2019: B Natriuretic Peptide 248.7; BUN 17; Creatinine, Ser 1.23; Potassium 3.8; Sodium 143  Personally reviewed   Wt Readings from Last 3 Encounters:  04/15/19 79.7 kg (175 lb 12.8 oz)  04/09/19 78 kg (172 lb)  03/03/19 77 kg (169 lb 12.8 oz)      ASSESSMENT AND PLAN:  1. Pulmonary HTN with severe TR - Echo 06/17/18 LVEF 65-70%, Mild MR, Severe RAE, Severe TR, Trivial PI, PA peak pressure 68 mm/Hg, trivial pericardial effusion.  -Echo suggestive of WHO Group I PAH with RV strain and severe TR but RHC x 2 without significant PAH - Bubble study negative for shunt - cMRI 11/20 with RVEF 40% severe TR. Moderate effusion. No LGE or other RV pathology.  - PFTs with significant obstruction/restriction with markedly decreased DLCO - 6MW 03/03/19 without hypoxemia - Hi-res CT and VQ normal but showed evidence of PAH - cMRI 11/20  1. Moderate RVE with decreased RVEF 40% 2.  Normal LV size and function LVEF 62% 3.  Severe TR 4. Moderate circumferential pericardial effusion - RHC x 2 with minimal PAH and normal PVR - Reviewed with structural heart team. Will get TEE and refer to Dr. Roxy Manns for possible TVR  2. Lupus with history of Nephritis - Followed by Dr. Amil Amen and Dr. Moshe Cipro  (Nephrology) - Recent flare. Now improved with cytoxan  3. HTN -Labile but improved  4. Severe tricuspid regurgitation  - likely secondary to dilated RV and poor leaflet coaptation. That said currently unclear why RV so dilated in setting of normal PA pressures and no exertional hypoxemia - unclear if this is primary or secondary. (I suspect secondary)  - Plan as above. Will schedule TEE and refer to Dr. Roxy Manns   5. DM2 - Sugars are still very labile. Managed by Dr. Debbora Presto   6. Snoring and fatigue - insurance refused at home and in-lab PSG.   7. Pericardial effusion - moderate no tamponade - continue to follow  COVID screen The patient does not have any symptoms that suggest any further testing/ screening at this time.  Social distancing reinforced today.  Recommended follow-up:  As above  Relevant cardiac medications were reviewed at length with the patient today.   The patient does not have concerns regarding their medications at this time.   The following changes were made today:  As above  Today, I have spent 24 minutes with the patient with telehealth technology discussing the above issues .    Signed, Glori Bickers, MD  07/27/2019 1:16 PM  Advanced [facility] Failure [place] [address] [phone] (office) [phone] (fax)

## 2019-07-27 NOTE — Telephone Encounter (Signed)
Pt called back, discussed plan for TEE and covid test, pt voiced understanding.

## 2019-07-28 ENCOUNTER — Other Ambulatory Visit (HOSPITAL_COMMUNITY): Payer: Self-pay

## 2019-07-28 DIAGNOSIS — I071 Rheumatic tricuspid insufficiency: Secondary | ICD-10-CM

## 2019-07-28 DIAGNOSIS — I272 Pulmonary hypertension, unspecified: Secondary | ICD-10-CM

## 2019-07-29 ENCOUNTER — Ambulatory Visit: Payer: BC Managed Care – PPO | Admitting: Registered"

## 2019-08-04 ENCOUNTER — Other Ambulatory Visit: Payer: Self-pay

## 2019-08-04 ENCOUNTER — Ambulatory Visit (HOSPITAL_COMMUNITY)
Admission: RE | Admit: 2019-08-04 | Discharge: 2019-08-04 | Disposition: A | Discharge status: Home / Self Care | Payer: BC Managed Care – PPO | Source: Ambulatory Visit | Attending: Cardiology | Admitting: Cardiology

## 2019-08-04 ENCOUNTER — Other Ambulatory Visit (HOSPITAL_COMMUNITY)
Admission: RE | Admit: 2019-08-04 | Discharge: 2019-08-04 | Disposition: A | Discharge status: Home / Self Care | Payer: BC Managed Care – PPO | Source: Ambulatory Visit | Attending: Internal Medicine | Admitting: Internal Medicine

## 2019-08-04 ENCOUNTER — Other Ambulatory Visit (HOSPITAL_COMMUNITY): Payer: BC Managed Care – PPO

## 2019-08-04 DIAGNOSIS — Z01812 Encounter for preprocedural laboratory examination: Secondary | ICD-10-CM | POA: Diagnosis present

## 2019-08-04 DIAGNOSIS — Z20822 Contact with and (suspected) exposure to covid-19: Secondary | ICD-10-CM | POA: Diagnosis not present

## 2019-08-04 DIAGNOSIS — I071 Rheumatic tricuspid insufficiency: Secondary | ICD-10-CM | POA: Insufficient documentation

## 2019-08-04 LAB — CBC
HCT: 28 % — ABNORMAL LOW (ref 36.0–46.0)
Hemoglobin: 9 g/dL — ABNORMAL LOW (ref 12.0–15.0)
MCH: 30.5 pg (ref 26.0–34.0)
MCHC: 32.1 g/dL (ref 30.0–36.0)
MCV: 94.9 fL (ref 80.0–100.0)
Platelets: 104 10*3/uL — ABNORMAL LOW (ref 150–400)
RBC: 2.95 MIL/uL — ABNORMAL LOW (ref 3.87–5.11)
RDW: 14.8 % (ref 11.5–15.5)
WBC: 2.1 10*3/uL — ABNORMAL LOW (ref 4.0–10.5)
nRBC: 0 % (ref 0.0–0.2)

## 2019-08-04 LAB — BASIC METABOLIC PANEL
Anion gap: 4 — ABNORMAL LOW (ref 5–15)
BUN: 26 mg/dL — ABNORMAL HIGH (ref 6–20)
CO2: 23 mmol/L (ref 22–32)
Calcium: 8.1 mg/dL — ABNORMAL LOW (ref 8.9–10.3)
Chloride: 112 mmol/L — ABNORMAL HIGH (ref 98–111)
Creatinine, Ser: 1.54 mg/dL — ABNORMAL HIGH (ref 0.44–1.00)
GFR calc Af Amer: 46 mL/min — ABNORMAL LOW (ref >60)
GFR calc non Af Amer: 40 mL/min — ABNORMAL LOW (ref >60)
Glucose, Bld: 98 mg/dL (ref 70–99)
Potassium: 4.3 mmol/L (ref 3.5–5.1)
Sodium: 139 mmol/L (ref 135–145)

## 2019-08-04 NOTE — Addendum Note (Signed)
Addended by: Scarlette Calico on: 08/04/2019 10:15 AM   Modules accepted: Orders

## 2019-08-05 LAB — NOVEL CORONAVIRUS, NAA (HOSP ORDER, SEND-OUT TO REF LAB; TAT 18-24 HRS): SARS-CoV-2, NAA: NOT DETECTED

## 2019-08-06 NOTE — Progress Notes (Signed)
Pre-call complete. All questions answered. Patient instructed per her endocrinologist to have a boost every morning around 0300 and 0400 due to morning hypoglycemia as low as 30's requiring EMS to be called. Pt instructed that it would be best for her to try and drink her boost by 12 if she must have a boost to drink it no later than 0230 am. Pt states she understands.

## 2019-08-07 ENCOUNTER — Encounter (HOSPITAL_COMMUNITY): Payer: Self-pay | Admitting: Internal Medicine

## 2019-08-07 ENCOUNTER — Other Ambulatory Visit: Payer: Self-pay

## 2019-08-07 ENCOUNTER — Encounter (HOSPITAL_COMMUNITY)
Admission: RE | Disposition: A | Discharge status: Home / Self Care | Payer: Self-pay | Source: Home / Self Care | Attending: Internal Medicine

## 2019-08-07 ENCOUNTER — Ambulatory Visit (HOSPITAL_COMMUNITY)
Admission: RE | Admit: 2019-08-07 | Discharge: 2019-08-07 | Disposition: A | Discharge status: Home / Self Care | Payer: BC Managed Care – PPO | Attending: Internal Medicine | Admitting: Internal Medicine

## 2019-08-07 ENCOUNTER — Ambulatory Visit (HOSPITAL_BASED_OUTPATIENT_CLINIC_OR_DEPARTMENT_OTHER)
Admission: RE | Admit: 2019-08-07 | Discharge: 2019-08-07 | Disposition: A | Discharge status: Home / Self Care | Payer: BC Managed Care – PPO | Source: Ambulatory Visit | Attending: Cardiology | Admitting: Cardiology

## 2019-08-07 DIAGNOSIS — M879 Osteonecrosis, unspecified: Secondary | ICD-10-CM | POA: Insufficient documentation

## 2019-08-07 DIAGNOSIS — R0683 Snoring: Secondary | ICD-10-CM | POA: Diagnosis not present

## 2019-08-07 DIAGNOSIS — M3214 Glomerular disease in systemic lupus erythematosus: Secondary | ICD-10-CM | POA: Diagnosis not present

## 2019-08-07 DIAGNOSIS — I119 Hypertensive heart disease without heart failure: Secondary | ICD-10-CM | POA: Insufficient documentation

## 2019-08-07 DIAGNOSIS — Z79890 Hormone replacement therapy (postmenopausal): Secondary | ICD-10-CM | POA: Diagnosis not present

## 2019-08-07 DIAGNOSIS — Z880 Allergy status to penicillin: Secondary | ICD-10-CM | POA: Diagnosis not present

## 2019-08-07 DIAGNOSIS — I361 Nonrheumatic tricuspid (valve) insufficiency: Secondary | ICD-10-CM

## 2019-08-07 DIAGNOSIS — Z794 Long term (current) use of insulin: Secondary | ICD-10-CM | POA: Insufficient documentation

## 2019-08-07 DIAGNOSIS — I071 Rheumatic tricuspid insufficiency: Secondary | ICD-10-CM | POA: Diagnosis present

## 2019-08-07 DIAGNOSIS — Z888 Allergy status to other drugs, medicaments and biological substances status: Secondary | ICD-10-CM | POA: Diagnosis not present

## 2019-08-07 DIAGNOSIS — E119 Type 2 diabetes mellitus without complications: Secondary | ICD-10-CM | POA: Insufficient documentation

## 2019-08-07 DIAGNOSIS — R5383 Other fatigue: Secondary | ICD-10-CM | POA: Insufficient documentation

## 2019-08-07 DIAGNOSIS — Z79899 Other long term (current) drug therapy: Secondary | ICD-10-CM | POA: Diagnosis not present

## 2019-08-07 DIAGNOSIS — Z8249 Family history of ischemic heart disease and other diseases of the circulatory system: Secondary | ICD-10-CM | POA: Insufficient documentation

## 2019-08-07 DIAGNOSIS — I272 Pulmonary hypertension, unspecified: Secondary | ICD-10-CM | POA: Diagnosis not present

## 2019-08-07 DIAGNOSIS — I313 Pericardial effusion (noninflammatory): Secondary | ICD-10-CM | POA: Insufficient documentation

## 2019-08-07 DIAGNOSIS — Z96643 Presence of artificial hip joint, bilateral: Secondary | ICD-10-CM | POA: Insufficient documentation

## 2019-08-07 HISTORY — PX: TEE WITHOUT CARDIOVERSION: SHX5443

## 2019-08-07 LAB — GLUCOSE, CAPILLARY
Glucose-Capillary: 36 mg/dL — CL (ref 70–99)
Glucose-Capillary: 95 mg/dL (ref 70–99)
Glucose-Capillary: 96 mg/dL (ref 70–99)

## 2019-08-07 SURGERY — ECHOCARDIOGRAM, TRANSESOPHAGEAL
Anesthesia: Moderate Sedation

## 2019-08-07 MED ORDER — FENTANYL CITRATE (PF) 100 MCG/2ML IJ SOLN
INTRAMUSCULAR | Status: DC | PRN
  Administered 2019-08-07 (×2): 25 ug via INTRAVENOUS

## 2019-08-07 MED ORDER — BUTAMBEN-TETRACAINE-BENZOCAINE 2-2-14 % EX AERO
INHALATION_SPRAY | CUTANEOUS | Status: DC | PRN
  Administered 2019-08-07: 09:00:00 2 via TOPICAL

## 2019-08-07 MED ORDER — DEXTROSE 50 % IV SOLN
INTRAVENOUS | Status: DC | PRN
  Administered 2019-08-07: 1 via INTRAVENOUS

## 2019-08-07 MED ORDER — FENTANYL CITRATE (PF) 100 MCG/2ML IJ SOLN
INTRAMUSCULAR | Status: AC
  Filled 2019-08-07: qty 2

## 2019-08-07 MED ORDER — SODIUM CHLORIDE 0.9 % IV SOLN: INTRAVENOUS | Status: DC

## 2019-08-07 MED ORDER — MIDAZOLAM HCL (PF) 10 MG/2ML IJ SOLN
INTRAMUSCULAR | Status: DC | PRN
  Administered 2019-08-07: 1 mg via INTRAVENOUS
  Administered 2019-08-07 (×2): 2 mg via INTRAVENOUS

## 2019-08-07 MED ORDER — DEXTROSE 50 % IV SOLN
INTRAVENOUS | Status: AC
  Filled 2019-08-07: qty 50

## 2019-08-07 MED ORDER — MIDAZOLAM HCL (PF) 5 MG/ML IJ SOLN
INTRAMUSCULAR | Status: AC
  Filled 2019-08-07: qty 2

## 2019-08-07 NOTE — CV Procedure (Signed)
    TRANSESOPHAGEAL ECHOCARDIOGRAM   NAME:  [name]   MRN: [id number] DOB:  11-Feb-1971   ADMIT DATE: 08/07/2019  INDICATIONS:  Tricuspid regurgitation  PROCEDURE:   Informed consent was obtained prior to the procedure. The risks, benefits and alternatives for the procedure were discussed and the patient comprehended these risks.  Risks include, but are not limited to, cough, sore throat, vomiting, nausea, somnolence, esophageal and stomach trauma or perforation, bleeding, low blood pressure, aspiration, pneumonia, infection, trauma to the teeth and death.    After a procedural time-out, the patient was given 5 mg versed and 50 mcg fentanyl for moderate sedation.  The oropharynx was anesthetized 10 cc of topical 1% viscous lidocaine.  The transesophageal probe was inserted in the esophagus and stomach without difficulty and multiple views were obtained.    COMPLICATIONS:    There were no immediate complications.  FINDINGS:  LEFT VENTRICLE: EF = 60%. No regional wall motion abnormalities.  RIGHT VENTRICLE: Normal size. Mild HK  LEFT ATRIUM: Moderate to severely dilated  LEFT ATRIAL APPENDAGE: No thrombus.   RIGHT ATRIUM: Severely dialted  AORTIC VALVE:  Trileaflet. No AI/AS  MITRAL VALVE:    Normal. Trivial MR  TRICUSPID VALVE: Mild restriction of septal leaflet. Severe central TR. RVSP ~95  PULMONIC VALVE: Grossly normal. No PI  INTERATRIAL SEPTUM: No PFO or ASD.  PERICARDIUM: No effusion  DESCENDING AORTA: Mild plaque    Benay Spice 9:57 AM

## 2019-08-07 NOTE — Progress Notes (Signed)
  Echocardiogram Echocardiogram Transesophageal has been performed.  [name] 08/07/2019, 10:05 AM

## 2019-08-07 NOTE — Interval H&P Note (Signed)
History and Physical Interval Note:  08/07/2019 9:26 AM  [name]  has presented today for surgery, with the diagnosis of TRICUSPID REGURGITATION.  The various methods of treatment have been discussed with the patient and family. After consideration of risks, benefits and other options for treatment, the patient has consented to  Procedure(s): TRANSESOPHAGEAL ECHOCARDIOGRAM (TEE) (N/A) as a surgical intervention.  The patient's history has been reviewed, patient examined, no change in status, stable for surgery.  I have reviewed the patient's chart and labs.  Questions were answered to the patient's satisfaction.     Nobuko Gsell

## 2019-08-10 ENCOUNTER — Encounter: Payer: BC Managed Care – PPO | Attending: Family Medicine | Admitting: Registered"

## 2019-08-10 ENCOUNTER — Ambulatory Visit: Payer: BC Managed Care – PPO | Admitting: Registered"

## 2019-08-10 ENCOUNTER — Other Ambulatory Visit: Payer: Self-pay

## 2019-08-10 ENCOUNTER — Encounter: Payer: Self-pay | Admitting: Registered"

## 2019-08-10 DIAGNOSIS — E119 Type 2 diabetes mellitus without complications: Secondary | ICD-10-CM | POA: Insufficient documentation

## 2019-08-10 DIAGNOSIS — E11649 Type 2 diabetes mellitus with hypoglycemia without coma: Secondary | ICD-10-CM

## 2019-08-10 NOTE — Progress Notes (Signed)
Diabetes Self-Management Education  Visit Type:    Appt. Start Time: 0915 Appt. End Time: 0945  08/10/2019  [name], identified by name and date of birth, is a 49 y.o. female with a diagnosis of Diabetes:    ASSESSMENT  Last menstrual period 04/02/2017. There is no height or weight on file to calculate BMI.    Nutrition Follow-Up:    Pt reports things are going ok. Pt had a TEE test last week. Reports her cardiologist wants her to see a surgeon regarding a leaky valve. Pt reports still having same issue with low blood sugars. Reports noticing the lows more than before. Reports about a week ago her husband called EMS due to feeling she was acting like she was "drunk." Reports when EMS came her blood sugar was 57. Reports she has been drinking a Boost when she wakes in the morning or during the middle of the night. Pt reports she can't say it is not helping but is not sure. Her endocrinologist recommended having a snack at night and protein during the night. Pt has been doing the high protein Boost sometimes during the night or early morning depending on blood sugar reading. Pt reports she did not want to drink the Premier protein drink her doctor recommended previously due to taste. Reports drinking the High Protein Boost has been ok. Reports she has been including it for a little over a week. Pt reports since having EMS come everyone in her family has gotten concerned. Reports she wants to do something to reduce that concern. Reports her son got very scared. She reports she was not motivated enough before to make a change for herself but she is now motivated to make changes to prevent her family from worrying.   Has Boost High Protein at inconsistent times in the morning based on blood sugar sugar which makes time drink was consumed hard to document. Reports she sometimes checks blood sugar during the night. Pt reports sometimes her blood sugar will be too low for sensor to pick up and  sometimes she will then check manually with finger prick. Since starting the Boost during the early morning readings have been higher than before but still have some below 50.  Blood Sugar Over Past Week:  01/19: 2 AM low sensor would not pick up, pt then had Boost High Protein and at 712 AM: 74 01/20: 530 AM low sensor would not pick up, pt drank Boost High Protein, 107 at 750 AM 01/21: Pt had Boost early morning, 715 AM low sensor would not pick up, 735 AM: 51 (had Boost?); between ~04-1029 AM may have had a mini croissant and bacon; 1:37PM: 46 had lunch; 2:51 PM: 96  24 Hour Recall: 1/24 Breakfast (610-630 AM): Boost High Protein (very low blood sugar reading before that)  Snk: None reported. 10AM: 68 Lunch (12): french fries, small Sprite  350 PM: 85 Snk 4 PM: 1 Maxi B's cookie, no beverage  Dinner (730 AM): Hamburger, beans, 2 cresent rolls, ~8 oz Sprite, a little rice  Beverages: water, Sprite Pt went to bed a little after 1130 PM 11:39 PM: 272 During the night: 01/25: 1235 AM 278; 646 AM: 61   Preferred/Accepted Foods:  Grains/Starches: most grains-likes whole grain breads; brown rice; willing to try quinoa; potatoes, pinto beans if in chili only; dry cereal: golden grahams with milk Proteins: [place], chicken, beef, pork, doesn't care for seafood; pintos but only in chili; no eggs unless prepared in a food;  peanut butter but not fond of it Vegetables: carrots if in combination dishes; broccoli, celery if in soup; onion if in soup or in combination dishes; zucchini if in stir fry  Fruits: apples; was drinking juice but has reduced it. Still includes small amount of apple juice.  Dairy: cheese in and on foods but not alone; chocolate milk; ice cream Sauces/Dips/Spreads: BBQ sauce; A1; cooks with Ketchup but not otherwise Beverages: water; chocolate milk; juices Other: loves chicken soup with vegetables   Individualized Plan for Diabetes Self-Management Training:   Learning  Objective:  Patient will have a greater understanding of diabetes self-management. Patient education plan is to attend individual and/or group sessions per assessed needs and concerns.   Plan: Dietitian discussed treating lows at anytime with 15 g fast acting carbohydrates and then having a balanced snack once blood sugar is brought above 70. Discussed importance of consistent carbohydrate intake as well as eating at consistent times (eating every 3-5 hours). Discussed how many of pt's meals are very inconsistent in carbohydrate amount and very unbalanced which can lead to extremes in blood sugar. Discussed pt checking blood sugar more often (fasting, 1-2 hours after each meal, before bed, during night, and any other time she feels off). Discussed tracking food intake as well and how recording this information will help identify what may be causing extremes in blood sugar. Feel pt's very inconsistent meal and carbohydrate intake as well as intake of simple sugars is likely playing a big role. Pt appeared agreeable to information/goals discussed.   Instructions/Goals:  Continued Goal: Eat every 3-4 hours. Include protein and carbohydrate each time you eat. Try to have balanced meals like the plate.   Recommend 2-3 carbohydrate choices per meal (30-45 g per meal).   Recommend 1 carbohydrate choice (15 g Carb) and 1 protein serving at each snack (See handout)   Ideas: peanut butter and crackers, apple and peanut butter OR 1/2 banana with peanut butter  -Check blood sugar: Fasting, 1-2 hours after each meal, before bed, and during the night when you wake and any other time you feel symptoms of a low blood sugar.   -Track food intake to help assess how your intake is affecting blood sugar  -Treat all lows (70 or less) with fast acting carbohydrate: 4 oz juice or reg soda, 1 tbsp sugar, honey, syrup, glucose tablets. Then once up to above 70 have balanced snack if not eating within 30 minutes.   -We  MUST treat a low blood sugar before leaving for work. Think of it as more important than changing your clothes, brushing your teeth, etc as it is vital.   Must get blood sugar above 70 before leaving and then also have the Boost High Protein drink for breakfast.  Patient Instructions  Instructions/Goals:  Continued Goal: Eat every 3-4 hours. Include protein and carbohydrate each time you eat. Try to have balanced meals like the plate.   Recommend 2-3 carbohydrate choices per meal (30-45 g per meal).   Recommend 1 carbohydrate choice (15 g Carb) and 1 protein serving at each snack (See handout)   Ideas: peanut butter and crackers, apple and peanut butter OR 1/2 banana with peanut butter  -Check blood sugar: Fasting, 1-2 hours after each meal, before bed, and during the night when you wake and any other time you feel symptoms of a low blood sugar.   -Track food intake to help assess how your intake is affecting blood sugar  -Treat all lows (70 or  less) with fast acting carbohydrate: 4 oz juice or reg soda, 1 tbsp sugar, honey, syrup, glucose tablets. Then once up to above 70 have balanced snack if not eating within 30 minutes.   -We MUST treat a low blood sugar before leaving for work. Think of it as more important than changing your clothes, brushing your teeth, etc as it is vital.   Must get blood sugar above 70 before leaving and then also have the Boost High Protein drink for breakfast.         Education material provided: None.   If problems or questions, patient to contact team via:  Phone and Email. Provider to check in with pt next week.   Future DSME appointment:  2 weeks. Encouraged pt to call or email with any questions or concerns prior to next appointment.

## 2019-08-10 NOTE — Patient Instructions (Addendum)
Instructions/Goals:  Continued Goal: Eat every 3-4 hours. Include protein and carbohydrate each time you eat. Try to have balanced meals like the plate.   Recommend 2-3 carbohydrate choices per meal (30-45 g per meal).   Recommend 1 carbohydrate choice (15 g Carb) and 1 protein serving at each snack (See handout)   Ideas: peanut butter and crackers, apple and peanut butter OR 1/2 banana with peanut butter  -Check blood sugar: Fasting, 1-2 hours after each meal, before bed, and during the night when you wake and any other time you feel symptoms of a low blood sugar.   -Track food intake to help assess how your intake is affecting blood sugar  -Treat all lows (70 or less) with fast acting carbohydrate: 4 oz juice or reg soda, 1 tbsp sugar, honey, syrup, glucose tablets. Then once up to above 70 have balanced snack if not eating within 30 minutes.   -We MUST treat a low blood sugar before leaving for work. Think of it as more important than changing your clothes, brushing your teeth, etc as it is vital.   Must get blood sugar above 70 before leaving and then also have the Boost High Protein drink for breakfast.

## 2019-08-18 MED FILL — CYCLOPHOSPHAMIDE 50 MG CAPS: 50 | 30 days supply | Qty: 30 | Fill #2

## 2019-08-21 ENCOUNTER — Telehealth (HOSPITAL_COMMUNITY): Payer: Self-pay | Admitting: *Deleted

## 2019-08-21 DIAGNOSIS — I071 Rheumatic tricuspid insufficiency: Secondary | ICD-10-CM

## 2019-08-21 NOTE — Telephone Encounter (Signed)
Pt called to get TEE results, she states if Dr Haroldine Laws discussed w/her after the test she does not remember.  Per report, severe Tricuspid reg, per last OV note ref pt to Dr Roxy Manns for further eval.  Pt is aware and agreeable w/plan, ref placed to Dr Guy Sandifer office

## 2019-08-25 ENCOUNTER — Other Ambulatory Visit: Payer: Self-pay | Admitting: *Deleted

## 2019-08-25 ENCOUNTER — Institutional Professional Consult (permissible substitution): Payer: BC Managed Care – PPO | Admitting: Thoracic Surgery (Cardiothoracic Vascular Surgery)

## 2019-08-25 ENCOUNTER — Other Ambulatory Visit: Payer: Self-pay

## 2019-08-25 ENCOUNTER — Encounter: Payer: Self-pay | Admitting: Thoracic Surgery (Cardiothoracic Vascular Surgery)

## 2019-08-25 VITALS — BP 126/83 | HR 83 | Temp 97.7°F | Resp 16 | Ht 67.0 in | Wt 144.0 lb

## 2019-08-25 DIAGNOSIS — I071 Rheumatic tricuspid insufficiency: Secondary | ICD-10-CM

## 2019-08-25 DIAGNOSIS — I272 Pulmonary hypertension, unspecified: Secondary | ICD-10-CM | POA: Diagnosis not present

## 2019-08-25 DIAGNOSIS — I361 Nonrheumatic tricuspid (valve) insufficiency: Secondary | ICD-10-CM

## 2019-08-25 DIAGNOSIS — Z01818 Encounter for other preprocedural examination: Secondary | ICD-10-CM

## 2019-08-25 DIAGNOSIS — M329 Systemic lupus erythematosus, unspecified: Secondary | ICD-10-CM | POA: Diagnosis not present

## 2019-08-25 NOTE — Patient Instructions (Signed)
Continue all previous medications without any changes at this time  

## 2019-08-25 NOTE — Progress Notes (Signed)
[facility]  MULTIDISCIPLINARY HEART VALVE CLINIC  CARDIOTHORACIC SURGERY CONSULTATION REPORT  Referring Provider is [name], [name], MD Primary Nephrologist is Allison Parish, MD Primary Rheumatologist is Allison Duos, MD PCP is Allison Stains, MD  Chief Complaint  Patient presents with  . Tricuspid Regurgitation    SEVERE...eval for surgery...TEE 08/07/19, CARD MORPH 05/22/19, CATH 01/02/19, PFT 02/03/19, PULM PERF. 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  Patient has a long history of lupus dating back more than 20 years.  The patient was on prednisone for many years but eventually successfully weaned off after she had problems with avascular necrosis involving both hips and both shoulders as well as weight gain and type 2 diabetes mellitus.  For the most part she has been off of steroids for the past 2 and half to 3 years although she required a brief steroid taper on 1 occasion last fall.  She is currently immunosuppressed using Plaquenil and cyclophosphamide.  She was first diagnosed with congestive heart failure and fluid retention approximately 1 and half years ago.  She was referred to [name] in the advanced heart failure clinic who has been following her ever since.  Transthoracic echocardiogram performed December 2019 revealed normal left ventricular size and systolic function with mild mitral regurgitation, severe right atrial enlargement, severe tricuspid regurgitation, and peak pulmonary artery pressures estimated 68 mmHg.  Right and left heart cath was performed demonstrating normal coronary arteries with mild pulmonary  hypertension and normal PVR.  Follow-up echocardiogram February 2020 revealed normal left ventricular systolic function with mild right ventricular chamber enlargement.  The septum appears flat and there remains severe tricuspid regurgitation.  RV systolic pressures were estimated 86 mmHg.  A bubble study was negative.  Right heart catheterization was repeated June 2020 but there remained only mild pulmonary hypertension.  High resolution chest CT and V/Q studies were performed and notable for the absence of interstitial lung disease pulmonary embolism but findings were consistent with pulmonary hypertension.  Cardiac MRI revealed moderate right ventricular enlargement with right ventricular ejection fraction decreased to 40%.  There was normal left ventricular size and function.  There was severe tricuspid regurgitation very mild mitral regurgitation.  There is no sign of atrial septal defect, patent foramen ovale, nor ventricular septal defect.  Patient recently underwent transesophageal echocardiogram which revealed normal left ventricular size and systolic function.  There was mildly reduced right ventricular systolic function and the right ventricle did not appear severely dilated.  There was moderate left atrial enlargement with severe right atrial enlargement.  There was trivial mitral regurgitation.  There was severe tricuspid regurgitation.  The aortic valve appeared normal.  There was severely elevated pulmonary artery systolic pressure.  Cardiothoracic surgical consultation was requested.  Patient is married and lives locally in [place] with her husband.  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  She admits that she is not very active physically because of her numerous chronic medical problems.  She tires easily and describes exertional shortness of breath with moderate and occasionally low level activity.  Her exertional shortness of breath and fatigue  affects her daily activities and lifestyle to a moderate degree.  She denies resting shortness of breath, PND,  or orthopnea.  She has chronic lower extremity edema but it has been better recently.  She reports some abdominal swelling in the past but none recently.  She has lost some appetite and decreased energy and she has lost over 60 pounds in weight over the past 6 months unintentionally.  She denies any chest pain or chest tightness.  She does not have palpitations, dizzy spells, or syncope.  She reports that arthritis and arthralgias related to her lupus have been under fairly good control recently.  She does have active shingles with a rash around the right upper chest and shoulder which began approximately 8 or 9 days ago and seems to be slowly improving.  Past Medical History:  Diagnosis Date  . Avascular necrosis ([place])   . Hypertension   . Lupus ([place])   . Lupus nephritis ([place])   . Prediabetes 10/2011  . Pulmonary hypertension ([place])   . Tricuspid regurgitation   . TTP (thrombotic thrombopenic purpura) ([place]) 2007    Past Surgical History:  Procedure Laterality Date  . bilat hip grafts     2007  . BUNIONECTOMY  2018  . HIP SURGERY     x2  . RIGHT HEART CATH N/A 01/02/2019   Procedure: RIGHT HEART CATH;  Surgeon: Allison Artist, MD;  Location: [facility];  Service: Cardiovascular;  Laterality: N/A;  . RIGHT/LEFT HEART CATH AND CORONARY ANGIOGRAPHY N/A 07/11/2018   Procedure: RIGHT/LEFT HEART CATH AND CORONARY ANGIOGRAPHY;  Surgeon: Allison Artist, MD;  Location: [facility];  Service: Cardiovascular;  Laterality: N/A;  . TEE WITHOUT CARDIOVERSION N/A 08/07/2019   Procedure: TRANSESOPHAGEAL ECHOCARDIOGRAM (TEE);  Surgeon: Allison Artist, MD;  Location: [facility] ENDOSCOPY;  Service: Cardiovascular;  Laterality: N/A;  . TOTAL HIP ARTHROPLASTY  08/2016 and 10/2016   x2  . UMBILICAL HERNIA REPAIR      Family History  Problem Relation Age of Onset  . Breast  cancer Mother   . Heart disease Mother   . Cancer Mother   . Breast cancer Maternal Grandmother   . Cancer Maternal Grandmother   . Clotting disorder Father   . Hypertension Father   . Anemia Father   . Sleep apnea Father   . Cancer Maternal Grandfather     Social History   Socioeconomic History  . Marital status: Married    Spouse name: Not on file  . Number of children: 2  . Years of education: Not on file  . Highest education level: Not on file  Occupational History  . Not on file  Tobacco Use  . Smoking status: Never Smoker  . Smokeless tobacco: Never Used  Substance and Sexual Activity  . Alcohol use: No  . Drug use: No  . Sexual activity: Yes    Partners: Male    Birth control/protection: Pill, None    Comment: LoLoestrin Fe   Other Topics Concern  . Not on file  Social History Narrative  . Not on file   Social Determinants of Health   Financial Resource Strain:   . Difficulty of Paying Living Expenses: Not on file  Food Insecurity:   . Worried About Charity fundraiser in the Last Year: Not on file  . Ran Out of Food in the Last Year: Not on file  Transportation Needs:   . Lack of Transportation (Medical): Not on file  . Lack of Transportation (Non-Medical): Not on file  Physical Activity:   . Days of Exercise per Week:  Not on file  . Minutes of Exercise per Session: Not on file  Stress:   . Feeling of Stress : Not on file  Social Connections:   . Frequency of Communication with Friends and Family: Not on file  . Frequency of Social Gatherings with Friends and Family: Not on file  . Attends Religious Services: Not on file  . Active Member of Clubs or Organizations: Not on file  . Attends Archivist Meetings: Not on file  . Marital Status: Not on file  Intimate Partner Violence:   . Fear of Current or Ex-Partner: Not on file  . Emotionally Abused: Not on file  . Physically Abused: Not on file  . Sexually Abused: Not on file    Current  Outpatient Medications  Medication Sig Dispense Refill  . Cyclophosphamide (CYTOXAN PO) Take 50 mg by mouth at bedtime.     . furosemide (LASIX) 40 MG tablet Take 1 tablet (40 mg total) by mouth 3 (three) times a week. Every Monday, Wednesday and Friday (Patient taking differently: Take 40 mg by mouth every Monday, Wednesday, and Friday. ) 15 tablet 6  . hydroxychloroquine (PLAQUENIL) 200 MG tablet Take 400 mg by mouth daily.     Marland Kitchen levothyroxine (SYNTHROID) 50 MCG tablet Take 100 mcg by mouth daily before breakfast.     . losartan (COZAAR) 50 MG tablet Take 50 mg by mouth daily.    . metFORMIN (GLUCOPHAGE) 500 MG tablet Take 500-1,000 mg by mouth See admin instructions. Take 1000 mg by mouth in the morning and 500 mg in the evening    . potassium chloride SA (KLOR-CON) 20 MEQ tablet Take 1 tablet (20 mEq total) by mouth 3 (three) times a week. Every Monday, Wednesday, and Friday (Patient taking differently: Take 20 mEq by mouth every 7 (seven) days. ) 15 tablet 6  . spironolactone (ALDACTONE) 25 MG tablet TAKE (1/2) TABLET DAILY. (Patient taking differently: Take 12.5 mg by mouth daily. ) 15 tablet 0  . valACYclovir (VALTREX) 1000 MG tablet Take 1,000 mg by mouth 3 (three) times daily.     No current facility-administered medications for this visit.    Allergies  Allergen Reactions  . Lisinopril     angioedema  . Penicillins Hives    DID THE REACTION INVOLVE: Swelling of the face/tongue/throat, SOB, or low BP? Unknown Sudden or severe rash/hives, skin peeling, or the inside of the mouth or nose? Unknown Did it require medical treatment? Unknown When did it last happen?age 63 If all above answers are "NO", may proceed with cephalosporin use.       Review of Systems:   General:  decreased appetite, decreased energy, no weight gain, + weight loss, no fever  Cardiac:  no chest pain with exertion, no chest pain at rest, +SOB with exertion, no resting SOB, no PND, no orthopnea, no  palpitations, no arrhythmia, no atrial fibrillation, + LE edema, no dizzy spells, no syncope  Respiratory:  + exertional shortness of breath, no home oxygen, no productive cough, no dry cough, no bronchitis, no wheezing, no hemoptysis, no asthma, no pain with inspiration or cough, no sleep apnea, no CPAP at night  GI:   no difficulty swallowing, no reflux, no frequent heartburn, no hiatal hernia, no abdominal pain, no constipation, no diarrhea, no hematochezia, no hematemesis, no melena  GU:   no dysuria,  no frequency, no urinary tract infection, no hematuria, no kidney stones, no kidney disease  Vascular:  no pain suggestive of  claudication, no pain in feet, no leg cramps, no varicose veins, no DVT, no non-healing foot ulcer  Neuro:   no stroke, no TIA's, no seizures, no headaches, no temporary blindness one eye,  no slurred speech, no peripheral neuropathy, no chronic pain, no instability of gait, no memory/cognitive dysfunction  Musculoskeletal: + arthritis, + joint swelling, no myalgias, no difficulty walking, normal mobility   Skin:   + rash, + itching, no skin infections, no pressure sores or ulcerations  Psych:   no anxiety, no depression, no nervousness, no unusual recent stress  Eyes:   no blurry vision, no floaters, no recent vision changes, + wears glasses or contacts  ENT:   no hearing loss, no loose or painful teeth, no dentures, last saw dentist Fall 2020  Hematologic:  no easy bruising, no abnormal bleeding, no clotting disorder, no frequent epistaxis  Endocrine:  + diabetes, does check CBG's at home           Physical Exam:   BP 126/83 (BP Location: Right Arm, Patient Position: Sitting, Cuff Size: Normal)   Pulse 83   Temp 97.7 F (36.5 C)   Resp 16   Ht 5\' 7"  (1.702 m)   Wt 144 lb (65.3 kg)   LMP 04/02/2017   SpO2 100% Comment: RA  BMI 22.55 kg/m   General:  WDWN AA female NAD  HEENT:  Unremarkable   Neck:   + JVD, no bruits, no adenopathy   Chest:   clear to  auscultation, symmetrical breath sounds, no wheezes, no rhonchi   CV:   RRR, grade III/VI systolic murmur heard best at LSB,  no diastolic murmur  Abdomen:  soft, non-tender, no masses   Extremities:  warm, well-perfused, pulses diminished, mild bilateral LE edema  Rectal/GU  Deferred  Neuro:   Grossly non-focal and symmetrical throughout  Skin:   Clean and dry, no rashes, no breakdown   Diagnostic Tests:  ECHOCARDIOGRAM LIMITED REPORT       Patient Name:  [name] Date of Exam: 08/22/2018  Medical Rec #: [id number]     Height:    67.0 in  Accession #:  [id number]     Weight:    194.2 lb  Date of Birth: [birth year]      BSA:     2.00 m  Patient Age:  17 years      BP:      131/85 mmHg  Patient Gender: F         HR:      85 bpm.  Exam Location: Inpatient     Procedure: 2D Echo and Saline Contrast Bubble Study   Indications:  Pulmonary hypertension    History:    Patient has prior history of Echocardiogram examinations,  most         recent 06/17/2018.    Sonographer:  Johny Chess RDCS  Referring Phys: [place]    1. The left ventricle has normal systolic function of 0000000. The cavity  size was normal. There is no increased left ventricular wall thickness.  Echo evidence of normal diastolic relaxation.  2. The right ventricle has normal systolic function. The cavity was  mildly enlarged . There is no increase in right ventricular wall  thickness. Right ventricular systolic pressure is severely elevated with  an estimated pressure of 85.8 mmHg.  3. Right atrial size was moderately dilated.  4. Trivial pericardial effusion.  5. The mitral valve is normal in structure.  6. The tricuspid valve was normal in structure. Tricuspid valve  regurgitation is severe.  7. The aortic valve is tricuspid There is mild thickening of the aortic  valve.  8.  The pulmonic valve was normal in structure.  9. The inferior vena cava was dilated in size with >50% respiratory  variability.  10. Normal LV systolic function; moderate RAE; mild RVE; severe TR; severe  pulmonary hypertension; negative saline microcavitation study.   FINDINGS  Left Ventricle: The left ventricle has normal systolic function of  0000000. The cavity size was normal. There is no increased left ventricular  wall thickness. Echo evidence of normal diastolic relaxation.  Right Ventricle: The right ventricle has normal systolic function. The  cavity was mildly enlarged. There is no increase in right ventricular wall  thickness. Right ventricular systolic pressure is severely elevated with  an estimated pressure of 85.8 mmHg.  Left Atrium: Left atrial size was normal in size.  Right Atrium: Right atrial size was moderately dilated.  Interatrial Septum: No atrial level shunt detected by color flow Doppler.  Agitated saline contrast was given intravenously to evaluate for  intracardiac shunting.   Pericardium: Trivial pericardial effusion is present.  Mitral Valve: The mitral valve is normal in structure. Mitral valve  regurgitation is trivial by color flow Doppler.  Tricuspid Valve: The tricuspid valve was normal in structure. Tricuspid  valve regurgitation is severe by color flow Doppler.  Aortic Valve: The aortic valve is tricuspid There is mild thickening of  the aortic valve. Aortic valve regurgitation was not visualized by color  flow Doppler.  Pulmonic Valve: The pulmonic valve was normal in structure. Pulmonic valve  regurgitation is not visualized by color flow Doppler.  Venous: The inferior vena cava is dilated in size with greater than 50%  respiratory variability.      Diastology  LV e' lateral:  15.80 cm/s  LV E/e' lateral: 4.6  LV e' medial:  12.20 cm/s  LV E/e' medial: 6.0     RIGHT VENTRICLE  RV S prime:   12.10 cm/s  TAPSE (M-mode): 2.1 cm    RVSP:      85.8 mmHg   RIGHT ATRIUM      Index  RA Pressure: 8 mmHg  RA Area:   24.70 cm  RA Volume:  79.70 ml 39.91 ml/m  MITRAL VALVE       TR Peak grad: 77.8 mmHg  MV Area (PHT): 3.72 cm  TR Vmax:   441.00 cm/s  MV PHT:    59.16 msec RVSP:     85.8 mmHg  MV Decel Time: 204 msec  MV E velocity: 73.30 cm/s  MV A velocity: 43.70 cm/s  MV E/A ratio: 1.68     Kirk Ruths MD  Electronically signed by Kirk Ruths MD  Signature Date/Time: 08/22/2018/4:48:13 PM      NUCLEAR MEDICINE PERFUSION LUNG SCAN  TECHNIQUE: Perfusion images were obtained in multiple projections after intravenous injection of radiopharmaceutical.  Ventilation scans intentionally deferred if perfusion scan and chest x-ray adequate for interpretation during COVID 19 epidemic.  RADIOPHARMACEUTICALS:  1.5 mCi Tc-81m MAA IV  COMPARISON:  Chest radiograph 03/20/2019  FINDINGS: Reversal of normal anteroposterior perfusion gradient consistent with pulmonary arterial hypertension.  No segmental or subsegmental perfusion defects identified.  Enlargement of cardiac silhouette.  Ventilation exam not required.  IMPRESSION: Reversal of anteroposterior perfusion gradient consistent with pulmonary arterial hypertension.  Cardiomegaly.  No scintigraphic evidence of pulmonary emboli.   Electronically Signed   By: Elta Gu[place]  Thornton Papas M.D.   On: 03/20/2019 13:34    CT CHEST WITHOUT CONTRAST  TECHNIQUE: Multidetector CT imaging of the chest was performed following the standard protocol without intravenous contrast. High resolution imaging of the lungs, as well as inspiratory and expiratory imaging, was performed.  COMPARISON:  Chest CT 02/01/2009.  FINDINGS: Cardiovascular: Heart size is mildly enlarged. Small amount of pericardial fluid and/or thickening, unlikely to be of any hemodynamic significance at this time. No associated  pericardial calcification. Small amount of atherosclerotic calcifications in the thoracic aorta. No definite coronary artery calcifications. Dilatation of the pulmonic trunk (3.9 cm in diameter).  Mediastinum/Nodes: No pathologically enlarged mediastinal or hilar lymph nodes. Please note that accurate exclusion of hilar adenopathy is limited on noncontrast CT scans. Esophagus is unremarkable in appearance. No axillary lymphadenopathy.  Lungs/Pleura: High-resolution images demonstrate no significant regions of ground-glass attenuation, septal thickening, subpleural reticulation, parenchymal banding, traction bronchiectasis or frank honeycombing. Inspiratory and expiratory imaging is unremarkable.  Upper Abdomen: Unremarkable.  Musculoskeletal: There are no aggressive appearing lytic or blastic lesions noted in the visualized portions of the skeleton.  IMPRESSION: 1. No findings to suggest interstitial lung disease. 2. Mild cardiomegaly. There is also dilatation of the pulmonic trunk (3.9 cm in diameter), compatible with the reported clinical history of pulmonary arterial hypertension. 3. Small amount of pericardial fluid and/or thickening, unlikely to be of any hemodynamic significance at this time. 4. Mild aortic atherosclerosis.  Aortic Atherosclerosis (ICD10-I70.0).   Electronically Signed   By: Vinnie Langton M.D.   On: 03/25/2019 09:22     CARDIAC MRI  TECHNIQUE: The patient was scanned on a 1.5 Tesla Siemens magnet. A dedicated cardiac coil was used. Functional imaging was done using Fiesta sequences. 2,3, and 4 chamber views were done to assess for RWMA's. Modified Simpson's rule using a short axis stack was used to calculate an ejection fraction on a dedicated work Conservation officer, nature. The patient received Gadavist . After 10 minutes inversion recovery sequences were used to assess for infiltration and scar tissue.  CONTRAST:   Gadavist  FINDINGS: There is moderate LAE and severe RAE. No ASD/PFO/VSD noted. Normal ascending aortic root 2.9 cm. The LV was normal in size and function Septal thickness 10 mm. Quantitative LVEF was 62% (EDV 113 cc ESV 43 cc SV 70 cc) There was mild MR. The AV was tri leaflet and normal. There was severe appearing TR. The RV was moderately dilated. Basal diameter 53 mm mid chamber diameter 58 mm and length 77 mm. The quantitative RVEF was reduced at 40% (EDV 238 ESV 142 SV 96 cc) Delayed enhancement images showed no myocardial uptake There was a moderate circumferential pericardial effusion with dilated IVC and no diastolic RV collapse  IMPRESSION: 1. Moderate RVE with decreased RVEF 40%  2.  Normal LV size and function LVEF 62%  3.  Moderate LAE and Severe RAE  4.  Severe appearing TR  5.  Mild appearing MR  6. Moderate circumferential pericardial effusion with dilated IVC 2.4 cm no diastolic RV collapse  7.  NO ASD/PFO/VSD  Jenkins Rouge   Electronically Signed   By: Jenkins Rouge M.D.   On: 05/22/2019 12:53     TRANSESOPHOGEAL ECHO REPORT       Patient Name:  [name] Date of Exam: 08/07/2019  Medical Rec #: [id number]     Height:    67.0 in  Accession #:  [id number]     Weight:    148.0 lb  Date of Birth: [birth year]      BSA:     1.78 m  Patient Age:  57 years      BP:      143/90 mmHg  Patient Gender: F         HR:      69 bpm.  Exam Location: Inpatient     Procedure: Transesophageal Echo and 3D Echo   Indications:   tricuspid regurgitation    History:     Patient has prior history of Echocardiogram examinations,  most          recent 08/22/2018.    Sonographer:   Johny Chess  Referring Phys: [place]  Diagnosing Phys: Glori Bickers MD      PROCEDURE: Patients was under conscious sedation during this procedure.  Anesthetic  was administered intravenously by performing Physician: 37mcg  of Fentanyl. The transesophogeal probe was passed through the esophogus of  the patient. The patient developed  no complications during the procedure.   IMPRESSIONS    1. Left ventricular ejection fraction, by visual estimation, is 60 to  65%. The left ventricle has normal function. There is no left ventricular  hypertrophy.  2. The left ventricle has no regional wall motion abnormalities.  3. Global right ventricle has mildly reduced systolic function.The right  ventricular size is normal. No increase in right ventricular wall  thickness.  4. Left atrial size was moderately dilated.  5. Right atrial size was severely dilated.  6. The mitral valve is normal in structure. Trivial mitral valve  regurgitation.  7. The tricuspid valve is abnormal.  8. The tricuspid valve is abnormal. Tricuspid valve regurgitation is  severe.  9. Apparent mild restriction of septal leaflet. RVSP ~ 39mmHG.  10. The aortic valve is normal in structure. Aortic valve regurgitation is  not visualized.  11. The pulmonic valve was grossly normal. Pulmonic valve regurgitation is  not visualized.  12. Mild plaque invoving the descending aorta.  13. Severely elevated pulmonary artery systolic pressure.   FINDINGS  Left Ventricle: Left ventricular ejection fraction, by visual estimation,  is 60 to 65%. The left ventricle has normal function. The left ventricle  has no regional wall motion abnormalities. There is no left ventricular  hypertrophy.   Right Ventricle: The right ventricular size is normal. No increase in  right ventricular wall thickness. Global RV systolic function is has  mildly reduced systolic function. The tricuspid regurgitant velocity is  4.12 m/s, and with an assumed right atrial  pressure of 10 mmHg, the estimated right ventricular systolic pressure is  severely elevated at 78.1 mmHg.   Left Atrium: Left atrial  size was moderately dilated.   Right Atrium: Right atrial size was severely dilated   Pericardium: There is no evidence of pericardial effusion.   Mitral Valve: The mitral valve is normal in structure. Trivial mitral  valve regurgitation.   Tricuspid Valve: The tricuspid valve is abnormal. Tricuspid valve  regurgitation is severe. Apparent mild restriction of septal leaflet. RVSP  ~ 71mmHG.   Aortic Valve: The aortic valve is normal in structure. Aortic valve  regurgitation is not visualized.   Pulmonic Valve: The pulmonic valve was grossly normal. Pulmonic valve  regurgitation is not visualized.   Aorta: The aortic root and ascending aorta are structurally normal, with  no evidence of dilitation. There is mild plaque involving the descending  aorta.   Shunts: Saline contrast bubble study was negative, with no evidence of any  interatrial  shunt. No atrial level shunt detected by color flow Doppler.     TRICUSPID VALVE       Normals  TR Peak grad:  68.1 mmHg  TR Vmax:    459.00 cm/s 288 cm/s     Glori Bickers MD  Electronically signed by Glori Bickers MD  Signature Date/Time: 08/09/2019/11:50:59 AM      Impression:  Patient has stage D severe symptomatic primary tricuspid regurgitation.  She describes stable symptoms of exertional shortness of breath, fatigue, and lower extremity edema consistent with chronic right-sided congestive heart failure, [place] [facility] Association functional class IIb.  Symptoms have persisted despite optimal medical therapy with long-term diuretic use.  The patient has also had some intermittent abdominal swelling and loss of appetite with significant weight loss.    I have personally reviewed the patient's recent transesophageal echocardiogram as well as previous cardiac MRI, transthoracic echocardiograms, and diagnostic cardiac catheterizations.  The patient has mild to moderate right ventricular chamber enlargement with mild to  moderately reduced right ventricular function.  There is severe right atrial enlargement and severe tricuspid regurgitation.  Functional pathology of the tricuspid valve suggest possible leaflet restriction involving the septal leaflet of the tricuspid valve.  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  The remainder of the tricuspid valve appears reasonably normal.  Previous echocardiograms have documented significant septal flattening due to right ventricular pressure and volume overload.  I agree the patient would likely benefit from tricuspid valve repair, and based upon review of the patient's TEE I feel there is a relatively high likelihood that her valve should be repairable.  She may be an acceptable candidate for minimally invasive approach for surgery.  At present the patient has active shingles with a rash on the right anterior chest wall and right shoulder.  This seems to be improving and will need to resolve completely before we would consider elective surgical intervention.   Plan:  The patient and her husband were counseled at length regarding the indications, risks and potential benefits of tricuspid valve repair.  The rationale for elective surgery has been explained, including a comparison between surgery and continued medical therapy with close follow-up.  The likelihood of successful and durable valve repair has been discussed with particular reference to the findings of their recent echocardiogram.  Based upon these findings and previous experience, I have quoted them a greater than 95 percent likelihood of successful valve repair with less than 5 percent risk of mortality or major morbidity.  Alternative surgical approaches have been discussed including a comparison between conventional sternotomy and minimally-invasive techniques.  The relative risks and benefits of each have been reviewed as they  pertain to the patient's specific circumstances, and expectations for the patient's postoperative convalescence has been discussed.  The patient desires to proceed with surgery at some point in the near future once her active shingles has completely resolved.  As a next step the patient will undergo cardiac gated coronary CT angiography to rule out the interval development of significant proximal coronary artery disease since her previous diagnostic cardiac catheterization.  She will also undergo CT angiography of the aorta and iliac vessels to evaluate the feasibility of peripheral cannulation for surgery.  She will undergo IV hydration prior to CT angiography to minimize the risk of contrast nephropathy.  Patient will return in approximately 4 weeks to review the results of this test and possibly make final plans for surgery.  All questions answered.   I spent in excess of 90 minutes during the conduct of this office consultation and >50% of this time involved direct face-to-face encounter with the patient for counseling and/or coordination of their care.    Valentina Gu. Roxy Manns, MD 08/25/2019 1:10 PM

## 2019-08-27 ENCOUNTER — Ambulatory Visit: Payer: BC Managed Care – PPO | Admitting: Registered"

## 2019-08-28 ENCOUNTER — Other Ambulatory Visit: Payer: Self-pay | Admitting: *Deleted

## 2019-08-28 DIAGNOSIS — I071 Rheumatic tricuspid insufficiency: Secondary | ICD-10-CM

## 2019-08-31 ENCOUNTER — Telehealth (HOSPITAL_COMMUNITY): Payer: Self-pay | Admitting: Emergency Medicine

## 2019-08-31 NOTE — Telephone Encounter (Signed)
Reaching out to patient to offer assistance regarding upcoming cardiac imaging study; pt verbalizes understanding of appt date/time, parking situation and where to check in, pre-test NPO status and medications ordered, and verified current allergies; name and call back number provided for further questions should they arise Marchia Bond RN Navigator Cardiac Imaging [facility] [phone] office [phone] cell  Pt verbalized understanding of need for additional IV fluids prior/post contrast infusion and appt date and time for medical day.

## 2019-09-01 ENCOUNTER — Ambulatory Visit (HOSPITAL_COMMUNITY)
Admission: RE | Admit: 2019-09-01 | Discharge: 2019-09-01 | Disposition: A | Discharge status: Home / Self Care | Payer: BC Managed Care – PPO | Source: Ambulatory Visit | Attending: Thoracic Surgery (Cardiothoracic Vascular Surgery) | Admitting: Thoracic Surgery (Cardiothoracic Vascular Surgery)

## 2019-09-01 ENCOUNTER — Other Ambulatory Visit: Payer: Self-pay

## 2019-09-01 DIAGNOSIS — Z01818 Encounter for other preprocedural examination: Secondary | ICD-10-CM

## 2019-09-01 DIAGNOSIS — I071 Rheumatic tricuspid insufficiency: Secondary | ICD-10-CM | POA: Diagnosis not present

## 2019-09-01 DIAGNOSIS — I272 Pulmonary hypertension, unspecified: Secondary | ICD-10-CM | POA: Insufficient documentation

## 2019-09-01 LAB — BASIC METABOLIC PANEL WITH GFR
Anion gap: 7 (ref 5–15)
BUN: 21 mg/dL — ABNORMAL HIGH (ref 6–20)
CO2: 22 mmol/L (ref 22–32)
Calcium: 8.5 mg/dL — ABNORMAL LOW (ref 8.9–10.3)
Chloride: 115 mmol/L — ABNORMAL HIGH (ref 98–111)
Creatinine, Ser: 1.52 mg/dL — ABNORMAL HIGH (ref 0.44–1.00)
GFR calc Af Amer: 47 mL/min — ABNORMAL LOW
GFR calc non Af Amer: 41 mL/min — ABNORMAL LOW
Glucose, Bld: 153 mg/dL — ABNORMAL HIGH (ref 70–99)
Potassium: 4.3 mmol/L (ref 3.5–5.1)
Sodium: 144 mmol/L (ref 135–145)

## 2019-09-01 MED ORDER — SODIUM CHLORIDE 0.9 % WEIGHT BASED INFUSION
3.0000 mL/kg/h | INTRAVENOUS | Status: DC
  Administered 2019-09-01: 3 mL/kg/h via INTRAVENOUS

## 2019-09-01 MED ORDER — SODIUM CHLORIDE 0.9 % WEIGHT BASED INFUSION: 1.0000 mL/kg/h | INTRAVENOUS | Status: DC

## 2019-09-01 MED ORDER — NITROGLYCERIN 0.4 MG SL SUBL
SUBLINGUAL_TABLET | SUBLINGUAL | Status: AC
  Filled 2019-09-01: qty 2

## 2019-09-01 MED ORDER — METOPROLOL TARTRATE 5 MG/5ML IV SOLN
INTRAVENOUS | Status: AC
  Filled 2019-09-01: qty 5

## 2019-09-01 MED ORDER — IOHEXOL 350 MG/ML SOLN
100.0000 mL | Freq: Once | INTRAVENOUS | Status: AC | PRN
  Administered 2019-09-01: 12:00:00 100 mL via INTRAVENOUS

## 2019-09-01 NOTE — Progress Notes (Signed)
Called and spoke to [name] in cardiac CT.  Let her know patient will be done with first hour of fluids at 1120 and is in room 3 in medical day

## 2019-09-02 MED FILL — FREESTYLE LIBRE 14 DAY SENS: 28 days supply | Qty: 2 | Fill #1

## 2019-09-09 ENCOUNTER — Ambulatory Visit: Payer: BC Managed Care – PPO | Admitting: Registered"

## 2019-09-10 ENCOUNTER — Other Ambulatory Visit (HOSPITAL_COMMUNITY): Payer: Self-pay | Admitting: Internal Medicine

## 2019-09-11 ENCOUNTER — Telehealth: Payer: Self-pay

## 2019-09-11 NOTE — Telephone Encounter (Signed)
Pt called office today to report that she has her COVID-19 vaccinations scheduled for tomorrow and 3/27, and she wants to make sure this is okay if she and Dr. Roxy Manns plan for her to have surgery at the end of March. I explained that, depending on how she handles the first dose, Dr. Roxy Manns may prefer that she wait a little longer for surgery d/t the chance of her developing a fever and/or other side effects. Instructed her to discuss w/ Dr. Roxy Manns during her next appointment on 09/21/19.

## 2019-09-17 ENCOUNTER — Encounter: Payer: BC Managed Care – PPO | Attending: Family Medicine | Admitting: Registered"

## 2019-09-17 ENCOUNTER — Other Ambulatory Visit: Payer: Self-pay

## 2019-09-17 DIAGNOSIS — E119 Type 2 diabetes mellitus without complications: Secondary | ICD-10-CM | POA: Insufficient documentation

## 2019-09-17 NOTE — Progress Notes (Signed)
Diabetes Self-Management Education  Visit Type:    Appt. Start Time: 0805 Appt. End Time: [id number]  09/17/2019  [name], identified by name and date of birth, is a 49 y.o. female with a diagnosis of Diabetes:    ASSESSMENT  Last menstrual period 04/02/2017. There is no height or weight on file to calculate BMI.    Nutrition Follow-Up:    Pt reports she stopped taking her metformin starting about 2 weeks ago. Reports she had to stop taking it for a couple days when having a heart assessment and hasn't started back since then. Reports she needs to talk with her doctor about this. Reports blood sugars have been pretty much the same, low in the morning and ok rest of day per pt.  Pt will be having tricuspid repair or replacement next month on April 7.  Reports she has been going between [place] 2. Pt reports she knew her blood sugar was low this morning. Reports she rechecked after taking 3 glucose tablets and it was 78. Reports she then had a croissant and will have cereal when she gets home. Reports she has been trying to be intentional about eating more often.   Pt reports her fasting readings have been in the 50s most of the time now, feels they have been some higher than before since being off of Metformin. Reports vision is often not clear in morning time which she believes is due to low blood sugars.   Fasting over past week:  03/02: 43 03/01: 40 02/28: 40 02/27: 47 02/25: 141 (unsure why this reading was much higher) 02/22: 53  Postprandial: Pt has not been checking postprandial regularly.  Reports last night blood sugar was 275 when she went to bed 2-3 hours after meal but she had a Hi-C drink which she figured increased her blood sugar.  Reports she found out she was taking too much of synthroid medication. Pt reports she feels it may have negatively affected appetite.  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 Reports she has been trying to take snacks to work.   Reports wt has been stable between 146-152 lb. Pt weighs daily to assess fluid status.   24 Hour Recall: Pt worked from home this day:  Breakfast (before 9 AM): juice and croissant Snk (10 AM): 100 calorie bag of pretzels Lunch (1230-2 PM): Half of personal pizza   Snk (~1-2 PM): Hot chocolate and popcorn Snk (PM): Hi-C juice drink  Dinner (730 PM): beef, broccoli, and rice, water Checked 2-3 hours after dinner and blood sugar was 275. Blood sugar was low (unsure how low) this morning and after 3 glucose tablets was 78.   Preferred/Accepted Foods:  Grains/Starches: most grains-likes whole grain breads; brown rice; willing to try quinoa; potatoes, pinto beans if in chili only; dry cereal: golden grahams with milk Proteins: [place], chicken, beef, pork, doesn't care for seafood; pintos but only in chili; no eggs unless prepared in a food; peanut butter but not fond of it Vegetables: carrots if in combination dishes; broccoli, celery if in soup; onion if in soup or in combination dishes; zucchini if in stir fry  Fruits: apples; was drinking juice but has reduced it. Still includes small amount of apple juice.  Dairy: cheese in and on foods but not alone; chocolate milk; ice cream Sauces/Dips/Spreads: BBQ sauce; A1; cooks with Ketchup but not otherwise Beverages: water;  chocolate milk; juices Other: loves chicken soup with vegetables   Individualized Plan for Diabetes Self-Management Training:   Learning Objective:  Patient will have a greater understanding of diabetes self-management. Patient education plan is to attend individual and/or group sessions per assessed needs and concerns.   Plan: Dietitian praised pt for working to eat more often. Discussed having protein with carbohydrates to help balance blood sugar as pt reports often having carbohydrates s alone as a meal or snack. Recommended pt  check blood sugar in the morning and at least one other time during the day (1-2 hours after a meal) as well as anytime when pt feels off. Discussed that with pt's history of asymptomatic lows would recommended checking more often than once daily. Recommended going ahead and taking full dose/4 glucose tablets to get the full 15g carbohydrates when having a low as pt reports sometimes taking less than the full amount. Recommend tracking what pt eats in the evening and what blood sugar is the next morning to help identify what may be contributing to low fasting blood sugars. Pt appeared agreeable to information/goals discussed.   Instructions/Goals:  Continued Goal: Eat every 3-4 hours. Include protein and carbohydrate each time you eat. Try to have balanced meals like the plate.   Recommend 2-3 carbohydrate choices per meal (30-45 g per meal).   Recommend 1 carbohydrate choice (15 g Carb) and 1 protein serving at each snack (See handout)   Ideas: peanut butter and crackers, apple and peanut butter OR 1/2 banana with peanut butter  Have protein at each meal and snack  If unable to eat solid foods, have a high protein boost   -Check blood sugar: Fasting, 1-2 hours after each meal, before bed, and during the night when you wake and any other time you feel symptoms of a low blood sugar.   -Track food intake in the evening (can take picture if needed) and include time of last meal or snack at night.   -Treat all lows (70 or less) with fast acting carbohydrate: 4 oz juice or reg soda, 1 tbsp sugar, honey, syrup, glucose tablets (take the whole 15 g amount) repeating process until above 70. Then once up to above 70 have a balanced snack if not eating within 30 minutes.   -We MUST treat a low blood sugar before leaving for work. Think of it as more important than changing your clothes, brushing your teeth, etc as it is vital.   Must get blood sugar above 70 before leaving and then also have the Boost  High Protein drink for breakfast.   Patient Instructions  Instructions/Goals:  Continued Goal: Eat every 3-4 hours. Include protein and carbohydrate each time you eat. Try to have balanced meals like the plate.   Recommend 2-3 carbohydrate choices per meal (30-45 g per meal).   Recommend 1 carbohydrate choice (15 g Carb) and 1 protein serving at each snack (See handout)   Ideas: peanut butter and crackers, apple and peanut butter OR 1/2 banana with peanut butter  Have protein at each meal and snack  If unable to eat solid foods, have a high protein boost   -Check blood sugar: Fasting, 1-2 hours after each meal, before bed, and during the night when you wake and any other time you feel symptoms of a low blood sugar.   -Track food intake in the evening (can take picture if needed) and include time of last meal or snack at night.   -Treat all lows (  70 or less) with fast acting carbohydrate: 4 oz juice or reg soda, 1 tbsp sugar, honey, syrup, glucose tablets (take the whole 15 g amount) repeating process until above 70. Then once up to above 70 have a balanced snack if not eating within 30 minutes.   -We MUST treat a low blood sugar before leaving for work. Think of it as more important than changing your clothes, brushing your teeth, etc as it is vital.   Must get blood sugar above 70 before leaving and then also have the Boost High Protein drink for breakfast.    Education material provided: None.   If problems or questions, patient to contact team via:  Phone and Email. Provider to check in with pt next week.   Future DSME appointment:   2 weeks. Encouraged pt to call or email with any questions or concerns prior to next appointment. Pt reports she will check her schedule at home and call to scheduled follow-up.

## 2019-09-17 NOTE — Patient Instructions (Addendum)
Instructions/Goals:  Continued Goal: Eat every 3-4 hours. Include protein and carbohydrate each time you eat. Try to have balanced meals like the plate.   Recommend 2-3 carbohydrate choices per meal (30-45 g per meal).   Recommend 1 carbohydrate choice (15 g Carb) and 1 protein serving at each snack (See handout)   Ideas: peanut butter and crackers, apple and peanut butter OR 1/2 banana with peanut butter  Have protein at each meal and snack  If unable to eat solid foods, have a high protein boost   -Check blood sugar: Fasting, 1-2 hours after each meal, before bed, and during the night when you wake and any other time you feel symptoms of a low blood sugar.   -Track food intake in the evening (can take picture if needed) and include time of last meal or snack at night.   -Treat all lows (70 or less) with fast acting carbohydrate: 4 oz juice or reg soda, 1 tbsp sugar, honey, syrup, glucose tablets (take the whole 15 g amount) repeating process until above 70. Then once up to above 70 have a balanced snack if not eating within 30 minutes.   -We MUST treat a low blood sugar before leaving for work. Think of it as more important than changing your clothes, brushing your teeth, etc as it is vital.   Must get blood sugar above 70 before leaving and then also have the Boost High Protein drink for breakfast.

## 2019-09-21 ENCOUNTER — Other Ambulatory Visit: Payer: Self-pay

## 2019-09-21 ENCOUNTER — Ambulatory Visit: Payer: BC Managed Care – PPO | Admitting: Thoracic Surgery (Cardiothoracic Vascular Surgery)

## 2019-09-21 ENCOUNTER — Encounter: Payer: Self-pay | Admitting: Thoracic Surgery (Cardiothoracic Vascular Surgery)

## 2019-09-21 VITALS — BP 138/89 | HR 75 | Temp 97.4°F | Resp 16 | Ht 67.0 in | Wt 154.0 lb

## 2019-09-21 DIAGNOSIS — I361 Nonrheumatic tricuspid (valve) insufficiency: Secondary | ICD-10-CM

## 2019-09-21 DIAGNOSIS — I071 Rheumatic tricuspid insufficiency: Secondary | ICD-10-CM

## 2019-09-21 NOTE — Patient Instructions (Signed)
Stop taking cyclophosphamide and hydroxychloroquine 2 days prior to surgery  Continue taking all other medications without change through the day before surgery.  Make sure to bring all of your medications with you when you come for your Pre-Admission Testing appointment at [facility] Short-Stay Department.  Have nothing to eat or drink after midnight the night before surgery.  On the morning of surgery take only Synthroid with a sip of water.  At your appointment for Pre-Admission Testing at the [facility] Short-Stay Department you will be asked to sign permission forms for your upcoming surgery.  By definition your signature on these forms implies that you and/or your designee provide full informed consent for your planned surgical procedure(s), that alternative treatment options have been discussed, that you understand and accept any and all potential risks, and that you have some understanding of what to expect for your post-operative convalescence.  For any major cardiac surgical procedure potential operative risks include but are not limited to at least some risk of death, stroke or other neurologic complication, myocardial infarction, congestive heart failure, respiratory failure, renal failure, bleeding requiring blood transfusion and/or reexploration, irregular heart rhythm, heart block or bradycardia requiring permanent pacemaker, pneumonia, pericardial effusion, pleural effusion, wound infection, pulmonary embolus or other thromboembolic complication, chronic pain, or other complications related to the specific procedure(s) performed.  Please call to schedule a follow-up appointment in our office prior to surgery if you have any unresolved questions about your planned surgical procedure, the associated risks, alternative treatment options, and/or expectations for your post-operative recovery.

## 2019-09-21 NOTE — Progress Notes (Signed)
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  Patient was initially seen in consultation on August 25, 2019 at which time she made a decision to proceed with further diagnostic testing and possible elective tricuspid valve repair or replacement.  Since then she underwent CT angiography.  She returns to the office today with her husband present.  She states that she remains clinically stable with stable symptoms of exertional shortness of breath and fatigue.  Her weight is been stable on current diuretic regimen.  She has had very mild lower extremity edema.  Her appetite remains marginal but she is eating some.  She has not been gaining or losing weight.  She denies any fevers, chills, or productive cough.  Her previous painful cutaneous rash from shingles has essentially resolved completely.  She recently received her first injection for COVID-19 vaccine.   Current Outpatient Medications  Medication Sig Dispense Refill  . Cyclophosphamide (CYTOXAN PO) Take 50 mg by mouth at bedtime.     . furosemide (LASIX) 40 MG tablet Take 1 tablet (40 mg total) by mouth 3 (three) times a week. Every Monday, Wednesday and Friday (Patient taking differently: Take 40 mg by mouth every Monday, Wednesday, and Friday. ) 15 tablet 6  . hydroxychloroquine (PLAQUENIL) 200 MG tablet Take 400 mg by mouth daily.     Marland Kitchen  levothyroxine (SYNTHROID) 50 MCG tablet Take 100 mcg by mouth daily before breakfast.     . losartan (COZAAR) 50 MG tablet Take 50 mg by mouth daily.    . metFORMIN (GLUCOPHAGE) 500 MG tablet Take 500-1,000 mg by mouth See admin instructions. Take 1000 mg by mouth in the morning and 500 mg in the evening    . potassium chloride SA (KLOR-CON) 20 MEQ tablet Take 1 tablet (20 mEq total) by mouth 3 (three) times a week. Every Monday, Wednesday, and Friday (Patient taking differently: Take 20 mEq by mouth every 7 (seven) days. ) 15 tablet 6  . spironolactone (ALDACTONE) 25 MG tablet TAKE (1/2) TABLET DAILY. 15 tablet 2  . valACYclovir (VALTREX) 1000 MG tablet Take 1,000 mg by mouth 3 (three) times daily.     No current facility-administered medications for this visit.      Physical Exam:   BP 138/89 (BP Location: Right Arm, Patient Position: Sitting, Cuff Size: Normal)   Pulse 75   Temp (!) 97.4 F (36.3 C)   Resp 16   Ht 5\' 7"  (1.702 m)   Wt 154 lb (69.9 kg)   LMP 04/02/2017   SpO2 100% Comment: RA  BMI 24.12 kg/m   General:  Well-appearing  Chest:   Clear to auscultation  CV:   Regular rate and rhythm with systolic murmur heard best along the sternal border  Incisions:  N/A -rash along right anterolateral chest wall has dried up and essentially resolved  Abdomen:  Soft and nondistended  Extremities:  Warm and well-perfused, trace lower extremity edema  Diagnostic Tests:  Cardiac CTA  MEDICATIONS: Sub lingual nitro. 4 mg and lopressor 0mg   TECHNIQUE: The patient was scanned on a [employer] 192 scanner. Gantry rotation speed was 250 msecs. Collimation was. 6 mm . A 120 kV prospective scan was triggered in the ascending thoracic aorta at 140 HU's with full mA between 30-70% of the R-R interval . Average HR during the scan was 68 bpm. The 3D data set was interpreted on a dedicated work station using MPR, MIP and VRT modes. A total of 80 cc of contrast was  used.  FINDINGS: Non-cardiac: See separate report from [facility]. No significant findings on limited lung and soft tissue windows.  Calcium score: No calcium noted  Coronary Arteries: Left  dominant with no anomalies  LM: Normal  LAD: Normal large vessel wraps apex and supplies some of the traditional PDA territory  IM: Large branch normal  D1: Normal  Circumflex: Dominant normal  OM1: Normal  OM2: Normal  OM3: Normal  PLB: Normal  PDA: Normal  RCA: Small non dominant normal - ostium comes off sinus at somewhat of an acute angle  Pulmonary veins are normal with no anomaly. No ASD/PFO. No VSD. Normal aortic root 3.3 cm Severe RAE. Moderate RVE  TV annulus measures 43 mm. Basal RV measures 52 mm, mid RV measures 42 mm and RV length 76 mm  IMPRESSION: 1. Calcium Score 0  2.  Normal left dominant coronary arteries  3.  Normal aortic root 3.3 cm  4.  Normal PV;s with no anomaly.  No ASD/PFO, VSD  5.  Severe RAE  6.  Moderate RVE  7.  TV annulus 43 mm  Jenkins Rouge   Electronically Signed   By: Jenkins Rouge M.D.   On: 09/01/2019 14:18    CT ANGIOGRAPHY CHEST, ABDOMEN AND PELVIS  TECHNIQUE: Multidetector CT imaging through the chest, abdomen and pelvis was performed using the standard protocol during bolus administration of intravenous contrast. Multiplanar reconstructed images and MIPs were obtained and reviewed to evaluate the vascular anatomy.  CONTRAST:  13mL OMNIPAQUE IOHEXOL 350 MG/ML SOLN  COMPARISON:  Chest CT 03/24/2019.  CT the pelvis 08/16/2012.  FINDINGS: CTA CHEST FINDINGS  Cardiovascular: Heart size is enlarged with right atrial dilatation. There is no significant pericardial fluid, thickening or pericardial calcification. Dilatation of the pulmonic trunk (3.5 cm in diameter). No significant atherosclerotic disease in the thoracic aorta. No definite coronary artery  calcifications.  Mediastinum/Nodes: No pathologically enlarged mediastinal or hilar lymph nodes. Esophagus is unremarkable in appearance. No axillary lymphadenopathy.  Lungs/Pleura: No suspicious appearing pulmonary nodules or masses are noted. No acute consolidative airspace disease. No pleural effusions.  Musculoskeletal: There are no aggressive appearing lytic or blastic lesions noted in the visualized portions of the skeleton. Old healed posterior left-sided rib fractures incidentally noted.  Review of the MIP images confirms the above findings.  CTA ABDOMEN AND PELVIS FINDINGS  VASCULAR  Aorta: Normal caliber aorta without aneurysm, dissection, vasculitis or significant stenosis.  Celiac: Patent without evidence of aneurysm, dissection, vasculitis or significant stenosis.  SMA: Patent without evidence of aneurysm, dissection, vasculitis or significant stenosis.  Renals: Both renal arteries are patent without evidence of aneurysm, dissection, vasculitis, fibromuscular dysplasia or significant stenosis.  IMA: Patent without evidence of aneurysm, dissection, vasculitis or significant stenosis.  Inflow: Patent without evidence of aneurysm, dissection, vasculitis or significant stenosis.  Veins: No obvious venous abnormality within the limitations of this arterial  phase study.  Review of the MIP images confirms the above findings.  NON-VASCULAR  Hepatobiliary: No suspicious cystic or solid hepatic lesions. No intra or extrahepatic biliary ductal dilatation. 5 mm calcified gallstone in the gallbladder. No findings to suggest an acute cholecystitis at this time.  Pancreas: No pancreatic mass. No pancreatic ductal dilatation. No pancreatic or peripancreatic fluid collections or inflammatory changes.  Spleen: Unremarkable.  Adrenals/Urinary Tract: Bilateral kidneys and adrenal glands are normal in appearance. No hydroureteronephrosis. Urinary  bladder is obscured by beam hardening artifact from the patient's bilateral hip arthroplasties.  Stomach/Bowel: Normal appearance of the stomach. No pathologic dilatation of small bowel or colon. The appendix is not confidently identified and may be surgically absent. Regardless, there are no inflammatory changes noted adjacent to the cecum to suggest the presence of an acute appendicitis at this time.  Lymphatic: No lymphadenopathy noted in the abdomen or pelvis.  Reproductive: Uterus and ovaries are obscured by beam hardening artifact from the patient's bilateral hip arthroplasties.  Other: No significant volume of ascites.  No pneumoperitoneum.  Musculoskeletal: Status post bilateral total hip arthroplasty. There are no aggressive appearing lytic or blastic lesions noted in the visualized portions of the skeleton.  IMPRESSION: 1. No significant atherosclerotic disease. No findings to prevent peripheral cannulation for surgery. 2. Cardiomegaly with right atrial dilatation. 3. Dilatation of the pulmonic trunk (3.5 cm in diameter), compatible with reported clinical history of pulmonary arterial hypertension. 4. Cholelithiasis without evidence of acute cholecystitis at this time. 5. Additional incidental findings, as above.   Electronically Signed   By: Vinnie Langton M.D.   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  She describes stable symptoms of exertional shortness of breath, fatigue, and lower extremity edema consistent with chronic right-sided congestive heart failure, New York Heart Association functional class IIb.  Symptoms have persisted despite optimal medical therapy with long-term diuretic use.  The patient has also had some intermittent abdominal swelling and loss of appetite with significant weight loss.    I have personally reviewed the patient's recent transesophageal  echocardiogram, cardiac MRI, transthoracic echocardiograms, diagnostic cardiac catheterizations, coronary CT angiogram and CTA of the aorta and iliac vessels.  The patient has mild to moderate right ventricular chamber enlargement with mild to moderately reduced right ventricular function.  There is severe right atrial enlargement and severe tricuspid regurgitation.  Functional pathology of the tricuspid valve suggest possible leaflet restriction involving the septal leaflet of the tricuspid valve.  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  The remainder of the tricuspid valve appears reasonably normal.  Previous echocardiograms have documented significant septal flattening due to right ventricular pressure and volume overload.  I agree the patient would likely benefit from tricuspid valve repair, and based upon review of the patient's TEE I feel there is a relatively high likelihood that her valve should be repairable.    Coronary CT angiogram revealed no signs of significant coronary artery disease.  She appears to be an acceptable candidate for minimally invasive approach for surgery.    Plan:  The patient and her husband were again counseled at length regarding the indications, risks and potential benefits of tricuspid valve repair.  The rationale for elective surgery has been explained, including a comparison between surgery and continued medical therapy with close follow-up.  The likelihood of successful and durable valve repair has been discussed with particular  reference to the findings of their recent echocardiogram.  Alternative surgical approaches have been discussed including a comparison between conventional sternotomy and minimally-invasive techniques.  The relative risks and benefits of each have been reviewed as they pertain to the patient's specific circumstances, and expectations for the patient's  postoperative convalescence has been discussed.   We plan to proceed with surgery on October 21, 2019.  The patient understands and accepts all potential risks of surgery including but not limited to risk of death, stroke or other neurologic complication, myocardial infarction, congestive heart failure, respiratory failure, renal failure, bleeding requiring transfusion and/or reexploration, arrhythmia, infection or other wound complications, pneumonia, pleural and/or pericardial effusion, pulmonary embolus, aortic dissection or other major vascular complication, or delayed complications related to valve repair or replacement including but not limited to structural valve deterioration and failure, thrombosis, embolization, endocarditis, or paravalvular leak.  Specific risks potentially related to the minimally-invasive approach were discussed at length, including but not limited to risk of conversion to full or partial sternotomy, aortic dissection or other major vascular complication, unilateral acute lung injury or pulmonary edema, phrenic nerve dysfunction or paralysis, rib fracture, chronic pain, lung hernia, or lymphocele. All of their questions have been answered.    I spent in excess of 15 minutes during the conduct of this office consultation and >50% of this time involved direct face-to-face encounter with the patient for counseling and/or coordination of their care.    Valentina Gu. Roxy Manns, MD 09/21/2019 4:29 PM

## 2019-09-22 ENCOUNTER — Encounter: Payer: Self-pay | Admitting: *Deleted

## 2019-09-22 ENCOUNTER — Other Ambulatory Visit: Payer: Self-pay | Admitting: *Deleted

## 2019-09-22 DIAGNOSIS — I071 Rheumatic tricuspid insufficiency: Secondary | ICD-10-CM

## 2019-09-28 MED FILL — CYCLOPHOSPHAMIDE 50 MG CAPS: 50 | 30 days supply | Qty: 30 | Fill #0

## 2019-10-16 NOTE — Progress Notes (Signed)
[place], [place] [address] Phone: [phone] Fax: [phone]      Your procedure is scheduled on 10/20/2019 Wednesday.  Report to [facility] Main Entrance "A" at 6:30 A.M., and check in at the Admitting office.  Call this number if you have problems the morning of surgery:  [phone]  Call [phone] if you have any questions prior to your surgery date Monday-Friday 8am-4pm    Remember:  Do not eat or drink after midnight the night before your surgery    Take these medicines the morning of surgery with A SIP OF WATER  levothyroxine (SYNTHROID)  hydroxychloroquine (PLAQUENIL)   As of today, STOP taking any Aspirin (unless otherwise instructed by your surgeon) and Aspirin containing products, Aleve, Naproxen, Ibuprofen, Motrin, Advil, Goody's, BC's, all herbal medications, fish oil, and all vitamins.                      Do not wear jewelry, make up, or nail polish            Do not wear lotions, powders, perfumes, or deodorant.            Do not shave 48 hours prior to surgery.              Do not bring valuables to the hospital.            [facility] is not responsible for any belongings or valuables.  Do NOT Smoke (Tobacco/Vapping) or drink Alcohol 24 hours prior to your procedure If you use a CPAP at night, you may bring all equipment for your overnight stay.   Contacts, glasses, dentures or bridgework may not be worn into surgery.      For patients admitted to the hospital, discharge time will be determined by your treatment team.   Patients discharged the day of surgery will not be allowed to drive home, and someone needs to stay with them for 24 hours.    Special instructions:   [facility]- Preparing For Surgery  Before surgery, you can play an important role. Because skin is not sterile, your skin needs to be as free of germs as possible. You can reduce the number of  germs on your skin by washing with CHG (chlorahexidine gluconate) Soap before surgery.  CHG is an antiseptic cleaner which kills germs and bonds with the skin to continue killing germs even after washing.    Oral Hygiene is also important to reduce your risk of infection.  Remember - BRUSH YOUR TEETH THE MORNING OF SURGERY WITH YOUR REGULAR TOOTHPASTE  Please do not use if you have an allergy to CHG or antibacterial soaps. If your skin becomes reddened/irritated stop using the CHG.  Do not shave (including legs and underarms) for at least 48 hours prior to first CHG shower. It is OK to shave your face.  Please follow these instructions carefully.   1. Shower the NIGHT BEFORE SURGERY and the MORNING OF SURGERY with CHG Soap.   2. If you chose to wash your hair, wash your hair first as usual with your normal shampoo.  3. After you shampoo, rinse your hair and body thoroughly to remove the shampoo.  4. Use CHG as you would any other liquid soap. You can apply CHG directly to the skin and wash gently with a scrungie or a clean washcloth.   5. Apply the CHG Soap to  your body ONLY FROM THE NECK DOWN.  Do not use on open wounds or open sores. Avoid contact with your eyes, ears, mouth and genitals (private parts). Wash Face and genitals (private parts)  with your normal soap.   6. Wash thoroughly, paying special attention to the area where your surgery will be performed.  7. Thoroughly rinse your body with warm water from the neck down.  8. DO NOT shower/wash with your normal soap after using and rinsing off the CHG Soap.  9. Pat yourself dry with a CLEAN TOWEL.  10. Wear CLEAN PAJAMAS to bed the night before surgery, wear comfortable clothes the morning of surgery  11. Place CLEAN SHEETS on your bed the night of your first shower and DO NOT SLEEP WITH PETS.   Day of Surgery:   Do not apply any deodorants/lotions.  Please wear clean clothes to the hospital/surgery center.   Remember to  brush your teeth WITH YOUR REGULAR TOOTHPASTE.   Please read over the following fact sheets that you were given.

## 2019-10-16 NOTE — Progress Notes (Signed)
[name], [name] [address] Phone: [phone] Fax: [phone]    Your procedure is scheduled on Wednesday, April 7th  Report to [facility] Main Entrance "A" at 6:30 A.M., and check in at the Admitting office.  Call this number if you have problems the morning of surgery:  [phone]  Call [phone] if you have any questions prior to your surgery date Monday-Friday 8am-4pm   Remember:  Do not eat or drink after midnight the night before your surgery    Take these medicines the morning of surgery with A SIP OF WATER  levothyroxine (SYNTHROID)  hydroxychloroquine (PLAQUENIL)   As of today, STOP taking any Aspirin (unless otherwise instructed by your surgeon) and Aspirin containing products, Aleve, Naproxen, Ibuprofen, Motrin, Advil, Goody's, BC's, all herbal medications, fish oil, and all vitamins.         WHAT DO I DO ABOUT MY DIABETES MEDICATION?  - Morning of surgery  Do not take metFORMIN (GLUCOPHAGE)   HOW TO MANAGE YOUR DIABETES BEFORE AND AFTER SURGERY  Why is it important to control my blood sugar before and after surgery? . Improving blood sugar levels before and after surgery helps healing and can limit problems. . A way of improving blood sugar control is eating a healthy diet by: o  Eating less sugar and carbohydrates o  Increasing activity/exercise o  Talking with your doctor about reaching your blood sugar goals . High blood sugars (greater than 180 mg/dL) can raise your risk of infections and slow your recovery, so you will need to focus on controlling your diabetes during the weeks before surgery. . Make sure that the doctor who takes care of your diabetes knows about your planned surgery including the date and location.  How do I manage my blood sugar before surgery? . Check your blood sugar at least 4 times a day, starting 2 days before surgery, to make sure that the  level is not too high or low. . Check your blood sugar the morning of your surgery when you wake up and every 2 hours until you get to the Short Stay unit. o If your blood sugar is less than 70 mg/dL, you will need to treat for low blood sugar: - Do not take insulin. - Treat a low blood sugar (less than 70 mg/dL) with  cup of clear juice (cranberry or apple), 4 glucose tablets, OR glucose gel. - Recheck blood sugar in 15 minutes after treatment (to make sure it is greater than 70 mg/dL). If your blood sugar is not greater than 70 mg/dL on recheck, call [phone] for further instructions. . Report your blood sugar to the short stay nurse when you get to Short Stay.  . If you are admitted to the hospital after surgery: o Your blood sugar will be checked by the staff and you will probably be given insulin after surgery (instead of oral diabetes medicines) to make sure you have good blood sugar levels. o The goal for blood sugar control after surgery is 80-180 mg/dL.             Do not wear jewelry, make up, or nail polish            Do not wear lotions, powders, perfumes, or deodorant.            Do not shave 48 hours prior to surgery.  Do not bring valuables to the hospital.            [facility] is not responsible for any belongings or valuables.  Do NOT Smoke (Tobacco/Vapping) or drink Alcohol 24 hours prior to your procedure If you use a CPAP at night, you may bring all equipment for your overnight stay.   Contacts, glasses, dentures or bridgework may not be worn into surgery.      For patients admitted to the hospital, discharge time will be determined by your treatment team.   Patients discharged the day of surgery will not be allowed to drive home, and someone needs to stay with them for 24 hours.  Special instructions:   [facility]- Preparing For Surgery  Before surgery, you can play an important role. Because skin is not sterile, your skin needs to be as free of  germs as possible. You can reduce the number of germs on your skin by washing with CHG (chlorahexidine gluconate) Soap before surgery.  CHG is an antiseptic cleaner which kills germs and bonds with the skin to continue killing germs even after washing.    Oral Hygiene is also important to reduce your risk of infection.  Remember - BRUSH YOUR TEETH THE MORNING OF SURGERY WITH YOUR REGULAR TOOTHPASTE  Please do not use if you have an allergy to CHG or antibacterial soaps. If your skin becomes reddened/irritated stop using the CHG.  Do not shave (including legs and underarms) for at least 48 hours prior to first CHG shower. It is OK to shave your face.  Please follow these instructions carefully.   1. Shower the NIGHT BEFORE SURGERY and the MORNING OF SURGERY with CHG Soap.   2. If you chose to wash your hair, wash your hair first as usual with your normal shampoo.  3. After you shampoo, rinse your hair and body thoroughly to remove the shampoo.  4. Use CHG as you would any other liquid soap. You can apply CHG directly to the skin and wash gently with a scrungie or a clean washcloth.   5. Apply the CHG Soap to your body ONLY FROM THE NECK DOWN.  Do not use on open wounds or open sores. Avoid contact with your eyes, ears, mouth and genitals (private parts). Wash Face and genitals (private parts)  with your normal soap.   6. Wash thoroughly, paying special attention to the area where your surgery will be performed.  7. Thoroughly rinse your body with warm water from the neck down.  8. DO NOT shower/wash with your normal soap after using and rinsing off the CHG Soap.  9. Pat yourself dry with a CLEAN TOWEL.  10. Wear CLEAN PAJAMAS to bed the night before surgery, wear comfortable clothes the morning of surgery  11. Place CLEAN SHEETS on your bed the night of your first shower and DO NOT SLEEP WITH PETS.  Day of Surgery Do not apply any deodorants/lotions.  Please wear clean clothes to the  hospital/surgery center.   Remember to brush your teeth WITH YOUR REGULAR TOOTHPASTE.   Please read over the following fact sheets that you were given.

## 2019-10-19 ENCOUNTER — Encounter (HOSPITAL_COMMUNITY): Payer: Self-pay

## 2019-10-19 ENCOUNTER — Other Ambulatory Visit: Payer: Self-pay

## 2019-10-19 ENCOUNTER — Ambulatory Visit: Payer: BC Managed Care – PPO | Admitting: Thoracic Surgery (Cardiothoracic Vascular Surgery)

## 2019-10-19 ENCOUNTER — Encounter: Payer: Self-pay | Admitting: Thoracic Surgery (Cardiothoracic Vascular Surgery)

## 2019-10-19 ENCOUNTER — Other Ambulatory Visit (HOSPITAL_COMMUNITY)
Admission: RE | Admit: 2019-10-19 | Discharge: 2019-10-19 | Disposition: A | Discharge status: Home / Self Care | Payer: BC Managed Care – PPO | Source: Ambulatory Visit | Attending: Thoracic Surgery (Cardiothoracic Vascular Surgery) | Admitting: Thoracic Surgery (Cardiothoracic Vascular Surgery)

## 2019-10-19 ENCOUNTER — Ambulatory Visit (HOSPITAL_COMMUNITY)
Admission: RE | Admit: 2019-10-19 | Discharge: 2019-10-19 | Disposition: A | Discharge status: Home / Self Care | Payer: BC Managed Care – PPO | Source: Ambulatory Visit | Attending: Thoracic Surgery (Cardiothoracic Vascular Surgery) | Admitting: Thoracic Surgery (Cardiothoracic Vascular Surgery)

## 2019-10-19 ENCOUNTER — Encounter (HOSPITAL_COMMUNITY)
Admission: RE | Admit: 2019-10-19 | Discharge: 2019-10-19 | Disposition: A | Discharge status: Home / Self Care | Payer: BC Managed Care – PPO | Source: Ambulatory Visit | Attending: Thoracic Surgery (Cardiothoracic Vascular Surgery) | Admitting: Thoracic Surgery (Cardiothoracic Vascular Surgery)

## 2019-10-19 VITALS — BP 138/86 | HR 81 | Temp 97.9°F | Resp 20 | Ht 67.0 in | Wt 156.0 lb

## 2019-10-19 DIAGNOSIS — I071 Rheumatic tricuspid insufficiency: Secondary | ICD-10-CM

## 2019-10-19 DIAGNOSIS — Z20822 Contact with and (suspected) exposure to covid-19: Secondary | ICD-10-CM | POA: Diagnosis not present

## 2019-10-19 DIAGNOSIS — Z01818 Encounter for other preprocedural examination: Secondary | ICD-10-CM | POA: Insufficient documentation

## 2019-10-19 LAB — BLOOD GAS, ARTERIAL
Acid-base deficit: 1.8 mmol/L (ref 0.0–2.0)
Bicarbonate: 22.8 mmol/L (ref 20.0–28.0)
Drawn by: 421801
FIO2: 21
O2 Saturation: 99.2 %
Patient temperature: 37
pCO2 arterial: 41.1 mmHg (ref 32.0–48.0)
pH, Arterial: 7.363 (ref 7.350–7.450)
pO2, Arterial: 142 mmHg — ABNORMAL HIGH (ref 83.0–108.0)

## 2019-10-19 LAB — URINALYSIS, ROUTINE W REFLEX MICROSCOPIC
Bacteria, UA: NONE SEEN
Bilirubin Urine: NEGATIVE
Glucose, UA: NEGATIVE mg/dL
Hgb urine dipstick: NEGATIVE
Ketones, ur: NEGATIVE mg/dL
Leukocytes,Ua: NEGATIVE
Nitrite: NEGATIVE
Protein, ur: >=300 mg/dL — AB
Specific Gravity, Urine: 1.02 (ref 1.005–1.030)
pH: 5 (ref 5.0–8.0)

## 2019-10-19 LAB — HEMOGLOBIN A1C
Hgb A1c MFr Bld: 7.3 % — ABNORMAL HIGH (ref 4.8–5.6)
Mean Plasma Glucose: 162.81 mg/dL

## 2019-10-19 LAB — APTT: aPTT: 35 seconds (ref 24–36)

## 2019-10-19 LAB — COMPREHENSIVE METABOLIC PANEL
ALT: 31 U/L (ref 0–44)
AST: 32 U/L (ref 15–41)
Albumin: 2.6 g/dL — ABNORMAL LOW (ref 3.5–5.0)
Alkaline Phosphatase: 70 U/L (ref 38–126)
Anion gap: 5 (ref 5–15)
BUN: 17 mg/dL (ref 6–20)
CO2: 21 mmol/L — ABNORMAL LOW (ref 22–32)
Calcium: 8.1 mg/dL — ABNORMAL LOW (ref 8.9–10.3)
Chloride: 115 mmol/L — ABNORMAL HIGH (ref 98–111)
Creatinine, Ser: 1.39 mg/dL — ABNORMAL HIGH (ref 0.44–1.00)
GFR calc Af Amer: 53 mL/min — ABNORMAL LOW (ref >60)
GFR calc non Af Amer: 45 mL/min — ABNORMAL LOW (ref >60)
Glucose, Bld: 149 mg/dL — ABNORMAL HIGH (ref 70–99)
Potassium: 4.8 mmol/L (ref 3.5–5.1)
Sodium: 141 mmol/L (ref 135–145)
Total Bilirubin: 0.4 mg/dL (ref 0.3–1.2)
Total Protein: 5.5 g/dL — ABNORMAL LOW (ref 6.5–8.1)

## 2019-10-19 LAB — SURGICAL PCR SCREEN
MRSA, PCR: NEGATIVE
Staphylococcus aureus: NEGATIVE

## 2019-10-19 LAB — CBC
HCT: 25.3 % — ABNORMAL LOW (ref 36.0–46.0)
Hemoglobin: 8.1 g/dL — ABNORMAL LOW (ref 12.0–15.0)
MCH: 30.9 pg (ref 26.0–34.0)
MCHC: 32 g/dL (ref 30.0–36.0)
MCV: 96.6 fL (ref 80.0–100.0)
Platelets: 86 10*3/uL — ABNORMAL LOW (ref 150–400)
RBC: 2.62 MIL/uL — ABNORMAL LOW (ref 3.87–5.11)
RDW: 14.5 % (ref 11.5–15.5)
WBC: 2.4 10*3/uL — ABNORMAL LOW (ref 4.0–10.5)
nRBC: 0 % (ref 0.0–0.2)

## 2019-10-19 LAB — SARS CORONAVIRUS 2 (TAT 6-24 HRS): SARS Coronavirus 2: NEGATIVE

## 2019-10-19 LAB — PROTIME-INR
INR: 1 (ref 0.8–1.2)
Prothrombin Time: 13.4 seconds (ref 11.4–15.2)

## 2019-10-19 LAB — GLUCOSE, CAPILLARY
Glucose-Capillary: 51 mg/dL — ABNORMAL LOW (ref 70–99)
Glucose-Capillary: 85 mg/dL (ref 70–99)

## 2019-10-19 NOTE — Progress Notes (Signed)
PCP - Dr. [name] Cardiologist - [name] Endocrinologist: Dr. Cyd Silence  PPM/ICD - N/A Device Orders -N/A  Rep Notified - N/A  Chest x-ray - 10/19/19 EKG - 10/19/19 Stress Test -03/20/19  ECHO - 08/07/19 Cardiac Cath - 01/02/19  Sleep Study - denies CPAP - denies  Fasting Blood Sugar -40-60's  Checks Blood Sugar _2-3_ times a day; pt wears a continuous sensor monitor.  CBG at PAT: 51 on arrival; pt ate a snack and 85 on re-check 15 mins later.  Pt is followed by Dr. Chalmers Cater, currently taking metformin for diabetes and is known for having episodes of hypogylecemia. Pt states that she does not take metformin consistently and that Dr. Chalmers Cater is not aware of this. Pt advised to contact Dr. Almetta Lovely office to see if medication regime needs adjusting. Ebony Hail, [place] with anesthesia aware. Records requested from Dr. Almetta Lovely office for last office note and recent HGB A1C.    Blood Thinner Instructions:N/A Aspirin Instructions:N/A  ERAS Protcol -N/A PRE-SURGERY Ensure or G2-N/A   COVID TEST- Pt covid tested this morning. Aware to quarantine at home until surgery.    Anesthesia review: Yes, hypoglycemia and record requests from Dr. Almetta Lovely office.  Patient denies shortness of breath, fever, cough and chest pain at PAT appointment   All instructions explained to the patient, with a verbal understanding of the material. Patient agrees to go over the instructions while at home for a better understanding. Patient also instructed to self quarantine after being tested for COVID-19. The opportunity to ask questions was provided.   Coronavirus Screening  Have you experienced the following symptoms:  Cough yes/no: No Fever (>100.73F)  yes/no: No Runny nose yes/no: No Sore throat yes/no: No Difficulty breathing/shortness of breath  yes/no: No  Have you or a family member traveled in the last 14 days and where? yes/no: No   If the patient indicates "YES" to the above questions, their PAT will be  rescheduled to limit the exposure to others and, the surgeon will be notified. THE PATIENT WILL NEED TO BE ASYMPTOMATIC FOR 14 DAYS.   If the patient is not experiencing any of these symptoms, the PAT nurse will instruct them to NOT bring anyone with them to their appointment since they may have these symptoms or traveled as well.   Please remind your patients and families that hospital visitation restrictions are in effect and the importance of the restrictions. ]

## 2019-10-19 NOTE — Progress Notes (Signed)
Pre op ultrasound testing       has been completed. Preliminary results can be found under CV proc through chart review. June Leap, BS, RDMS, RVT

## 2019-10-19 NOTE — Patient Instructions (Addendum)
Stop taking Plaquenil and metformin  Continue taking all other medications without change through the day before surgery.  Make sure to bring all of your medications with you when you come for your Pre-Admission Testing appointment at [facility]Stay [facility].  Have nothing to eat or drink after midnight the night before surgery.  On the morning of surgery do not take any medications.

## 2019-10-19 NOTE — H&P (Signed)
[name]       [name]             [phone]          CARDIOTHORACIC SURGERY HISTORY AND PHYSICAL EXAM  Referring Provider is [name], [name], MD Primary Nephrologist is Allison Parish, MD Primary Rheumatologist is Allison Duos, MD PCP is Allison Stains, MD   Chief Complaint  Patient presents with  . Tricuspid Regurgitation    SEVERE...eval for surgery...TEE 08/07/19, CARD MORPH 05/22/19, CATH 01/02/19, PFT 02/03/19, PULM PERF. 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  Patient has a long history of lupus dating back more than 20 years.  The patient was on prednisone for many years but eventually successfully weaned off after she had problems with avascular necrosis involving both hips and both shoulders as well as weight gain and type 2 diabetes mellitus.  For the most part she has been off of steroids for the past 2 and half to 3 years although she required a brief steroid taper on 1 occasion last fall.  She is currently immunosuppressed using Plaquenil and cyclophosphamide.  She was first diagnosed with congestive heart failure and fluid retention approximately 1 and half years ago.  She was referred to [name] in the advanced heart failure clinic who has been following her ever since.  Transthoracic echocardiogram performed December 2019 revealed normal left ventricular size and systolic function with mild mitral regurgitation, severe right atrial enlargement, severe tricuspid regurgitation, and peak pulmonary artery pressures estimated 68 mmHg.  Right and left heart cath was performed demonstrating  normal coronary arteries with mild pulmonary hypertension and normal PVR.  Follow-up echocardiogram February 2020 revealed normal left ventricular systolic function with mild right ventricular chamber enlargement.  The septum appears flat and there remains severe tricuspid regurgitation.  RV systolic pressures were estimated 86 mmHg.  A bubble study was negative.  Right heart catheterization was repeated June 2020 but there remained only mild pulmonary hypertension.  High resolution chest CT and V/Q studies were performed and notable for the absence of interstitial lung disease pulmonary embolism but findings were consistent with pulmonary hypertension.  Cardiac MRI revealed moderate right ventricular enlargement with right ventricular ejection fraction decreased to 40%.  There was normal left ventricular size and function.  There was severe tricuspid regurgitation very mild mitral regurgitation.  There is no sign of atrial septal defect, patent foramen ovale, nor ventricular septal defect.  Patient recently underwent transesophageal echocardiogram which revealed normal left ventricular size and systolic function.  There was mildly reduced right ventricular systolic function and the right ventricle did not appear severely dilated.  There was moderate left atrial enlargement with severe right atrial enlargement.  There was trivial mitral regurgitation.  There was severe tricuspid regurgitation.  The aortic valve appeared normal.  There was severely elevated pulmonary artery systolic pressure.  Cardiothoracic surgical consultation was requested.  Patient is married and lives locally in [name] with her husband.  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  She admits that she is not very active physically because of her numerous chronic medical problems.  She tires easily and describes exertional shortness of breath with moderate  and occasionally low level activity.  Her  exertional shortness of breath and fatigue affects her daily activities and lifestyle to a moderate degree.  She denies resting shortness of breath, PND, or orthopnea.  She has chronic lower extremity edema but it has been better recently.  She reports some abdominal swelling in the past but none recently.  She has lost some appetite and decreased energy and she has lost over 60 pounds in weight over the past 6 months unintentionally.  She denies any chest pain or chest tightness.  She does not have palpitations, dizzy spells, or syncope.  She reports that arthritis and arthralgias related to her lupus have been under fairly good control recently.  She does have active shingles with a rash around the right upper chest and shoulder which began approximately 8 or 9 days ago and seems to be slowly improving.  Patient was initially seen in consultation on August 25, 2019 at which time she made a decision to proceed with further diagnostic testing and possible elective tricuspid valve repair or replacement.  Since then she underwent CT angiography.  She returns to the office today with her husband present.  She states that she remains clinically stable with stable symptoms of exertional shortness of breath and fatigue.  Her weight is been stable on current diuretic regimen.  She has had very mild lower extremity edema.  Her appetite remains marginal but she is eating some.  She has not been gaining or losing weight.  She denies any fevers, chills, or productive cough.  Her previous painful cutaneous rash from shingles has essentially resolved completely.  She recently received her first injection for COVID-19 vaccine.   Past Medical History:  Diagnosis Date  . Avascular necrosis ([place])   . Diabetes mellitus without complication ([place]) 56/4332  . Hypertension   . Hypothyroidism 01/2019  . Lupus ([place])   . Lupus nephritis ([place])   . Prediabetes 10/2011  . Pulmonary hypertension ([place])   . Tricuspid  regurgitation   . TTP (thrombotic thrombopenic purpura) ([place]) 2007    Past Surgical History:  Procedure Laterality Date  . bilat hip grafts     2007  . BUNIONECTOMY  2018  . HIP SURGERY     x2  . RIGHT HEART CATH N/A 01/02/2019   Procedure: RIGHT HEART CATH;  Surgeon: Jolaine Artist, MD;  Location: [facility];  Service: Cardiovascular;  Laterality: N/A;  . RIGHT/LEFT HEART CATH AND CORONARY ANGIOGRAPHY N/A 07/11/2018   Procedure: RIGHT/LEFT HEART CATH AND CORONARY ANGIOGRAPHY;  Surgeon: Jolaine Artist, MD;  Location: [facility];  Service: Cardiovascular;  Laterality: N/A;  . SHOULDER SURGERY Bilateral 2015   Core displacement  . TEE WITHOUT CARDIOVERSION N/A 08/07/2019   Procedure: TRANSESOPHAGEAL ECHOCARDIOGRAM (TEE);  Surgeon: Jolaine Artist, MD;  Location: [facility] ENDOSCOPY;  Service: Cardiovascular;  Laterality: N/A;  . TOTAL HIP ARTHROPLASTY  08/2016 and 10/2016   x2  . UMBILICAL HERNIA REPAIR      Family History  Problem Relation Age of Onset  . Breast cancer Mother   . Heart disease Mother   . Cancer Mother   . Breast cancer Maternal Grandmother   . Cancer Maternal Grandmother   . Clotting disorder Father   . Hypertension Father   . Anemia Father   . Sleep apnea Father   . Cancer Maternal Grandfather     Social History Social History   Tobacco Use  . Smoking status: Never Smoker  .  Smokeless tobacco: Never Used  Substance Use Topics  . Alcohol use: No  . Drug use: No    Prior to Admission medications   Medication Sig Start Date End Date Taking? Authorizing Provider  furosemide (LASIX) 40 MG tablet Take 1 tablet (40 mg total) by mouth 3 (three) times a week. Every Monday, Wednesday and Friday Patient taking differently: Take 40 mg by mouth every Monday, Wednesday, and Friday.  04/15/19 04/14/20 Yes [name], [name], MD  glucose 4 GM chewable tablet Chew 4 tablets by mouth in the morning.   Yes [provider]   hydroxychloroquine (PLAQUENIL) 200 MG tablet Take 400 mg by mouth daily.    Yes [provider]  levothyroxine (SYNTHROID) 50 MCG tablet Take 50 mcg by mouth daily before breakfast.    Yes [provider]  losartan (COZAAR) 50 MG tablet Take 50 mg by mouth daily.   Yes [provider]  metFORMIN (GLUCOPHAGE) 500 MG tablet Take 500-1,000 mg by mouth See admin instructions. Take 1000 mg by mouth in the morning and 500 mg in the evening   Yes [provider]  spironolactone (ALDACTONE) 25 MG tablet TAKE (1/2) TABLET DAILY. Patient taking differently: Take 12.5 mg by mouth every Monday, Wednesday, and Friday.  09/11/19  Yes [name], [name], MD  potassium chloride SA (KLOR-CON) 20 MEQ tablet Take 1 tablet (20 mEq total) by mouth 3 (three) times a week. Every Monday, Wednesday, and Friday 04/15/19   [name], [name], MD    Allergies  Allergen Reactions  . Lisinopril     angioedema  . Penicillins Hives    DID THE REACTION INVOLVE: Swelling of the face/tongue/throat, SOB, or low BP? Unknown Sudden or severe rash/hives, skin peeling, or the inside of the mouth or nose? Unknown Did it require medical treatment? Unknown When did it last happen?age 89 If all above answers are "NO", may proceed with cephalosporin use.     Review of Systems:              General:                      decreased appetite, decreased energy, no weight gain, + weight loss, no fever             Cardiac:                       no chest pain with exertion, no chest pain at rest, +SOB with exertion, no resting SOB, no PND, no orthopnea, no palpitations, no arrhythmia, no atrial fibrillation, + LE edema, no dizzy spells, no syncope             Respiratory:                 + exertional shortness of breath, no home oxygen, no productive cough, no dry cough, no bronchitis, no wheezing, no hemoptysis, no asthma, no pain with inspiration or cough, no sleep apnea, no CPAP at night              GI:                               no difficulty swallowing, no reflux, no frequent heartburn, no hiatal hernia, no abdominal pain, no constipation, no diarrhea, no hematochezia, no hematemesis, no melena             GU:  no dysuria,  no frequency, no urinary tract infection, no hematuria, no kidney stones, no kidney disease             Vascular:                     no pain suggestive of claudication, no pain in feet, no leg cramps, no varicose veins, no DVT, no non-healing foot ulcer             Neuro:                         no stroke, no TIA's, no seizures, no headaches, no temporary blindness one eye,  no slurred speech, no peripheral neuropathy, no chronic pain, no instability of gait, no memory/cognitive dysfunction             Musculoskeletal:         + arthritis, + joint swelling, no myalgias, no difficulty walking, normal mobility              Skin:                            + rash, + itching, no skin infections, no pressure sores or ulcerations             Psych:                         no anxiety, no depression, no nervousness, no unusual recent stress             Eyes:                           no blurry vision, no floaters, no recent vision changes, + wears glasses or contacts             ENT:                            no hearing loss, no loose or painful teeth, no dentures, last saw dentist Fall 2020             Hematologic:               no easy bruising, no abnormal bleeding, no clotting disorder, no frequent epistaxis             Endocrine:                   + diabetes, does check CBG's at home                                                       Physical Exam:              BP 126/83 (BP Location: Right Arm, Patient Position: Sitting, Cuff Size: Normal)   Pulse 83   Temp 97.7 F (36.5 C)   Resp 16   Ht 5\' 7"  (1.702 m)   Wt 144 lb (65.3 kg)   LMP 04/02/2017   SpO2 100% Comment: RA  BMI 22.55 kg/m              General:  WDWN AA female NAD             HEENT:                       Unremarkable              Neck:                           + JVD, no bruits, no adenopathy              Chest:                          clear to auscultation, symmetrical breath sounds, no wheezes, no rhonchi              CV:                              RRR, grade III/VI systolic murmur heard best at LSB,  no diastolic murmur             Abdomen:                    soft, non-tender, no masses              Extremities:                 warm, well-perfused, pulses diminished, mild bilateral LE edema             Rectal/GU                   Deferred             Neuro:                         Grossly non-focal and symmetrical throughout             Skin:                            Clean and dry, no rashes, no breakdown   Diagnostic Tests:  ECHOCARDIOGRAM LIMITED REPORT       Patient Name:  [name] Date of Exam: 08/22/2018  Medical Rec #: [id number]     Height:    67.0 in  Accession #:  [id number]     Weight:    194.2 lb  Date of Birth: 06/12/1971      BSA:     2.00 m  Patient Age:  5 years      BP:      131/85 mmHg  Patient Gender: F         HR:      85 bpm.  Exam Location: Inpatient     Procedure: 2D Echo and Saline Contrast Bubble Study   Indications:  Pulmonary hypertension    History:    Patient has prior history of Echocardiogram examinations,  most         recent 06/17/2018.    Sonographer:  Johny Chess RDCS  Referring Phys: [place]    1. The left ventricle has normal systolic function of 14-48%. The cavity  size was normal. There is no increased left ventricular wall thickness.  Echo evidence of normal diastolic relaxation.  2. The right  ventricle has normal systolic function. The cavity was  mildly enlarged . There is no increase in right ventricular wall  thickness. Right  ventricular systolic pressure is severely elevated with  an estimated pressure of 85.8 mmHg.  3. Right atrial size was moderately dilated.  4. Trivial pericardial effusion.  5. The mitral valve is normal in structure.  6. The tricuspid valve was normal in structure. Tricuspid valve  regurgitation is severe.  7. The aortic valve is tricuspid There is mild thickening of the aortic  valve.  8. The pulmonic valve was normal in structure.  9. The inferior vena cava was dilated in size with >50% respiratory  variability.  10. Normal LV systolic function; moderate RAE; mild RVE; severe TR; severe  pulmonary hypertension; negative saline microcavitation study.   FINDINGS  Left Ventricle: The left ventricle has normal systolic function of  31-54%. The cavity size was normal. There is no increased left ventricular  wall thickness. Echo evidence of normal diastolic relaxation.  Right Ventricle: The right ventricle has normal systolic function. The  cavity was mildly enlarged. There is no increase in right ventricular wall  thickness. Right ventricular systolic pressure is severely elevated with  an estimated pressure of 85.8 mmHg.  Left Atrium: Left atrial size was normal in size.  Right Atrium: Right atrial size was moderately dilated.  Interatrial Septum: No atrial level shunt detected by color flow Doppler.  Agitated saline contrast was given intravenously to evaluate for  intracardiac shunting.   Pericardium: Trivial pericardial effusion is present.  Mitral Valve: The mitral valve is normal in structure. Mitral valve  regurgitation is trivial by color flow Doppler.  Tricuspid Valve: The tricuspid valve was normal in structure. Tricuspid  valve regurgitation is severe by color flow Doppler.  Aortic Valve: The aortic valve is tricuspid There is mild thickening of  the aortic valve. Aortic valve regurgitation was not visualized by color  flow Doppler.  Pulmonic Valve: The pulmonic  valve was normal in structure. Pulmonic valve  regurgitation is not visualized by color flow Doppler.  Venous: The inferior vena cava is dilated in size with greater than 50%  respiratory variability.      Diastology  LV e' lateral:  15.80 cm/s  LV E/e' lateral: 4.6  LV e' medial:  12.20 cm/s  LV E/e' medial: 6.0     RIGHT VENTRICLE  RV S prime:   12.10 cm/s  TAPSE (M-mode): 2.1 cm  RVSP:      85.8 mmHg   RIGHT ATRIUM      Index  RA Pressure: 8 mmHg  RA Area:   24.70 cm  RA Volume:  79.70 ml 39.91 ml/m  MITRAL VALVE       TR Peak grad: 77.8 mmHg  MV Area (PHT): 3.72 cm  TR Vmax:   441.00 cm/s  MV PHT:    59.16 msec RVSP:     85.8 mmHg  MV Decel Time: 204 msec  MV E velocity: 73.30 cm/s  MV A velocity: 43.70 cm/s  MV E/A ratio: 1.68     Kirk Ruths MD  Electronically signed by Kirk Ruths MD  Signature Date/Time: 08/22/2018/4:48:13 PM      NUCLEAR MEDICINE PERFUSION LUNG SCAN  TECHNIQUE: Perfusion images were obtained in multiple projections after intravenous injection of radiopharmaceutical.  Ventilation scans intentionally deferred if perfusion scan and chest x-ray adequate for interpretation during COVID 19 epidemic.  RADIOPHARMACEUTICALS: 1.5 mCi Tc-23m MAA IV  COMPARISON: Chest radiograph 03/20/2019  FINDINGS: Reversal  of normal anteroposterior perfusion gradient consistent with pulmonary arterial hypertension.  No segmental or subsegmental perfusion defects identified.  Enlargement of cardiac silhouette.  Ventilation exam not required.  IMPRESSION: Reversal of anteroposterior perfusion gradient consistent with pulmonary arterial hypertension.  Cardiomegaly.  No scintigraphic evidence of pulmonary emboli.   Electronically Signed By: Lavonia Dana M.D. On: 03/20/2019 13:34    CT CHEST WITHOUT CONTRAST  TECHNIQUE: Multidetector CT imaging of the chest was  performed following the standard protocol without intravenous contrast. High resolution imaging of the lungs, as well as inspiratory and expiratory imaging, was performed.  COMPARISON: Chest CT 02/01/2009.  FINDINGS: Cardiovascular: Heart size is mildly enlarged. Small amount of pericardial fluid and/or thickening, unlikely to be of any hemodynamic significance at this time. No associated pericardial calcification. Small amount of atherosclerotic calcifications in the thoracic aorta. No definite coronary artery calcifications. Dilatation of the pulmonic trunk (3.9 cm in diameter).  Mediastinum/Nodes: No pathologically enlarged mediastinal or hilar lymph nodes. Please note that accurate exclusion of hilar adenopathy is limited on noncontrast CT scans. Esophagus is unremarkable in appearance. No axillary lymphadenopathy.  Lungs/Pleura: High-resolution images demonstrate no significant regions of ground-glass attenuation, septal thickening, subpleural reticulation, parenchymal banding, traction bronchiectasis or frank honeycombing. Inspiratory and expiratory imaging is unremarkable.  Upper Abdomen: Unremarkable.  Musculoskeletal: There are no aggressive appearing lytic or blastic lesions noted in the visualized portions of the skeleton.  IMPRESSION: 1. No findings to suggest interstitial lung disease. 2. Mild cardiomegaly. There is also dilatation of the pulmonic trunk (3.9 cm in diameter), compatible with the reported clinical history of pulmonary arterial hypertension. 3. Small amount of pericardial fluid and/or thickening, unlikely to be of any hemodynamic significance at this time. 4. Mild aortic atherosclerosis.  Aortic Atherosclerosis (ICD10-I70.0).   Electronically Signed By: Vinnie Langton M.D. On: 03/25/2019 09:22     CARDIAC MRI  TECHNIQUE: The patient was scanned on a 1.5 Tesla Siemens magnet. A dedicated cardiac coil was used.  Functional imaging was done using Fiesta sequences. 2,3, and 4 chamber views were done to assess for RWMA's. Modified Simpson's rule using a short axis stack was used to calculate an ejection fraction on a dedicated work Conservation officer, nature. The patient received Gadavist . After 10 minutes inversion recovery sequences were used to assess for infiltration and scar tissue.  CONTRAST: Gadavist  FINDINGS: There is moderate LAE and severe RAE. No ASD/PFO/VSD noted. Normal ascending aortic root 2.9 cm. The LV was normal in size and function Septal thickness 10 mm. Quantitative LVEF was 62% (EDV 113 cc ESV 43 cc SV 70 cc) There was mild MR. The AV was tri leaflet and normal. There was severe appearing TR. The RV was moderately dilated. Basal diameter 53 mm mid chamber diameter 58 mm and length 77 mm. The quantitative RVEF was reduced at 40% (EDV 238 ESV 142 SV 96 cc) Delayed enhancement images showed no myocardial uptake There was a moderate circumferential pericardial effusion with dilated IVC and no diastolic RV collapse  IMPRESSION: 1. Moderate RVE with decreased RVEF 40%  2. Normal LV size and function LVEF 62%  3. Moderate LAE and Severe RAE  4. Severe appearing TR  5. Mild appearing MR  6. Moderate circumferential pericardial effusion with dilated IVC 2.4 cm no diastolic RV collapse  7. NO ASD/PFO/VSD  Jenkins Rouge   Electronically Signed By: Jenkins Rouge M.D. On: 05/22/2019 12:53     TRANSESOPHOGEAL ECHO REPORT       Patient Name:  Allison A  Porter Date of Exam: 08/07/2019  Medical Rec #: [id number]     Height:    67.0 in  Accession #:  [id number]     Weight:    148.0 lb  Date of Birth: 08-30-1970      BSA:     1.78 m  Patient Age:  44 years      BP:      143/90 mmHg  Patient Gender: F         HR:      69 bpm.  Exam Location: Inpatient     Procedure:  Transesophageal Echo and 3D Echo   Indications:   tricuspid regurgitation    History:     Patient has prior history of Echocardiogram examinations,  most          recent 08/22/2018.    Sonographer:   Johny Chess  Referring Phys: [place]  Diagnosing Phys: Glori Bickers MD      PROCEDURE: Patients was under conscious sedation during this procedure.  Anesthetic was administered intravenously by performing Physician: 22mcg  of Fentanyl. The transesophogeal probe was passed through the esophogus of  the patient. The patient developed  no complications during the procedure.   IMPRESSIONS    1. Left ventricular ejection fraction, by visual estimation, is 60 to  65%. The left ventricle has normal function. There is no left ventricular  hypertrophy.  2. The left ventricle has no regional wall motion abnormalities.  3. Global right ventricle has mildly reduced systolic function.The right  ventricular size is normal. No increase in right ventricular wall  thickness.  4. Left atrial size was moderately dilated.  5. Right atrial size was severely dilated.  6. The mitral valve is normal in structure. Trivial mitral valve  regurgitation.  7. The tricuspid valve is abnormal.  8. The tricuspid valve is abnormal. Tricuspid valve regurgitation is  severe.  9. Apparent mild restriction of septal leaflet. RVSP ~ 46mmHG.  10. The aortic valve is normal in structure. Aortic valve regurgitation is  not visualized.  11. The pulmonic valve was grossly normal. Pulmonic valve regurgitation is  not visualized.  12. Mild plaque invoving the descending aorta.  13. Severely elevated pulmonary artery systolic pressure.   FINDINGS  Left Ventricle: Left ventricular ejection fraction, by visual estimation,  is 60 to 65%. The left ventricle has normal function. The left ventricle  has no regional wall motion abnormalities. There is no left ventricular   hypertrophy.   Right Ventricle: The right ventricular size is normal. No increase in  right ventricular wall thickness. Global RV systolic function is has  mildly reduced systolic function. The tricuspid regurgitant velocity is  4.12 m/s, and with an assumed right atrial  pressure of 10 mmHg, the estimated right ventricular systolic pressure is  severely elevated at 78.1 mmHg.   Left Atrium: Left atrial size was moderately dilated.   Right Atrium: Right atrial size was severely dilated   Pericardium: There is no evidence of pericardial effusion.   Mitral Valve: The mitral valve is normal in structure. Trivial mitral  valve regurgitation.   Tricuspid Valve: The tricuspid valve is abnormal. Tricuspid valve  regurgitation is severe. Apparent mild restriction of septal leaflet. RVSP  ~ 34mmHG.   Aortic Valve: The aortic valve is normal in structure. Aortic valve  regurgitation is not visualized.   Pulmonic Valve: The pulmonic valve was grossly normal. Pulmonic valve  regurgitation is not visualized.   Aorta: The aortic root and  ascending aorta are structurally normal, with  no evidence of dilitation. There is mild plaque involving the descending  aorta.   Shunts: Saline contrast bubble study was negative, with no evidence of any  interatrial shunt. No atrial level shunt detected by color flow Doppler.     TRICUSPID VALVE       Normals  TR Peak grad:  68.1 mmHg  TR Vmax:    459.00 cm/s 288 cm/s     Glori Bickers MD  Electronically signed by Glori Bickers MD  Signature Date/Time: 08/09/2019/11:50:59 AM       Cardiac CTA  MEDICATIONS: Sub lingual nitro. 4 mg and lopressor 0mg   TECHNIQUE: The patient was scanned on a [employer] 192 scanner. Gantry rotation speed was 250 msecs. Collimation was. 6 mm . A 120 kV prospective scan was triggered in the ascending thoracic aorta at 140 HU's with full mA between 30-70% of the R-R interval .  Average HR during the scan was 68 bpm. The 3D data set was interpreted on a dedicated work station using MPR, MIP and VRT modes. A total of 80 cc of contrast was used.  FINDINGS: Non-cardiac: See separate report from [facility]. No significant findings on limited lung and soft tissue windows.  Calcium score: No calcium noted  Coronary Arteries: Left dominant with no anomalies  LM: Normal  LAD: Normal large vessel wraps apex and supplies some of the traditional PDA territory  IM: Large branch normal  D1: Normal  Circumflex: Dominant normal  OM1: Normal  OM2: Normal  OM3: Normal  PLB: Normal  PDA: Normal  RCA: Small non dominant normal - ostium comes off sinus at somewhat of an acute angle  Pulmonary veins are normal with no anomaly. No ASD/PFO. No VSD. Normal aortic root 3.3 cm Severe RAE. Moderate RVE  TV annulus measures 43 mm. Basal RV measures 52 mm, mid RV measures 42 mm and RV length 76 mm  IMPRESSION: 1. Calcium Score 0  2. Normal left dominant coronary arteries  3. Normal aortic root 3.3 cm  4. Normal PV;s with no anomaly. No ASD/PFO, VSD  5. Severe RAE  6. Moderate RVE  7. TV annulus 43 mm  Jenkins Rouge   Electronically Signed By: Jenkins Rouge M.D. On: 09/01/2019 14:18    CT ANGIOGRAPHY CHEST, ABDOMEN AND PELVIS  TECHNIQUE: Multidetector CT imaging through the chest, abdomen and pelvis was performed using the standard protocol during bolus administration of intravenous contrast. Multiplanar reconstructed images and MIPs were obtained and reviewed to evaluate the vascular anatomy.  CONTRAST: 181mL OMNIPAQUE IOHEXOL 350 MG/ML SOLN  COMPARISON: Chest CT 03/24/2019. CT the pelvis 08/16/2012.  FINDINGS: CTA CHEST FINDINGS  Cardiovascular: Heart size is enlarged with right atrial dilatation. There is no significant pericardial fluid, thickening or  pericardial calcification. Dilatation of the pulmonic trunk (3.5 cm in diameter). No significant atherosclerotic disease in the thoracic aorta. No definite coronary artery calcifications.  Mediastinum/Nodes: No pathologically enlarged mediastinal or hilar lymph nodes. Esophagus is unremarkable in appearance. No axillary lymphadenopathy.  Lungs/Pleura: No suspicious appearing pulmonary nodules or masses are noted. No acute consolidative airspace disease. No pleural effusions.  Musculoskeletal: There are no aggressive appearing lytic or blastic lesions noted in the visualized portions of the skeleton. Old healed posterior left-sided rib fractures incidentally noted.  Review of the MIP images confirms the above findings.  CTA ABDOMEN AND PELVIS FINDINGS  VASCULAR  Aorta: Normal caliber aorta without aneurysm, dissection, vasculitis or significant stenosis.  Celiac: Patent without  evidence of aneurysm, dissection, vasculitis or significant stenosis.  SMA: Patent without evidence of aneurysm, dissection, vasculitis or significant stenosis.  Renals: Both renal arteries are patent without evidence of aneurysm, dissection, vasculitis, fibromuscular dysplasia or significant stenosis.  IMA: Patent without evidence of aneurysm, dissection, vasculitis or significant stenosis.  Inflow: Patent without evidence of aneurysm, dissection, vasculitis or significant stenosis.  Veins: No obvious venous abnormality within the limitations of this arterial phase study.  Review of the MIP images confirms the above findings.  NON-VASCULAR  Hepatobiliary: No suspicious cystic or solid hepatic lesions. No intra or extrahepatic biliary ductal dilatation. 5 mm calcified gallstone in the gallbladder. No findings to suggest an acute cholecystitis at this time.  Pancreas: No pancreatic mass. No pancreatic ductal dilatation. No pancreatic or peripancreatic fluid collections or  inflammatory changes.  Spleen: Unremarkable.  Adrenals/Urinary Tract: Bilateral kidneys and adrenal glands are normal in appearance. No hydroureteronephrosis. Urinary bladder is obscured by beam hardening artifact from the patient's bilateral hip arthroplasties.  Stomach/Bowel: Normal appearance of the stomach. No pathologic dilatation of small bowel or colon. The appendix is not confidently identified and may be surgically absent. Regardless, there are no inflammatory changes noted adjacent to the cecum to suggest the presence of an acute appendicitis at this time.  Lymphatic: No lymphadenopathy noted in the abdomen or pelvis.  Reproductive: Uterus and ovaries are obscured by beam hardening artifact from the patient's bilateral hip arthroplasties.  Other: No significant volume of ascites. No pneumoperitoneum.  Musculoskeletal: Status post bilateral total hip arthroplasty. There are no aggressive appearing lytic or blastic lesions noted in the visualized portions of the skeleton.  IMPRESSION: 1. No significant atherosclerotic disease. No findings to prevent peripheral cannulation for surgery. 2. Cardiomegaly with right atrial dilatation. 3. Dilatation of the pulmonic trunk (3.5 cm in diameter), compatible with reported clinical history of pulmonary arterial hypertension. 4. Cholelithiasis without evidence of acute cholecystitis at this time. 5. Additional incidental findings, as above.   Electronically Signed By: Vinnie Langton M.D. 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 She describes stable symptoms of exertional shortness of breath, fatigue, and lower extremity edema consistent with chronic right-sided congestive heart failure, New York Heart Association functional class IIb. Symptoms have persisted despite optimal medical therapy with long-term diuretic use. The patient has also  had some intermittent abdominal swelling and loss of appetite with significant weight loss.   I have personally reviewed the patient's recent transesophageal echocardiogram, cardiac MRI, transthoracic echocardiograms, diagnostic cardiac catheterizations, coronary CT angiogram and CTA of the aorta and iliac vessels. The patient has mild to moderate right ventricular chamber enlargement with mild to moderately reduced right ventricular function. There is severe right atrial enlargement and severe tricuspid regurgitation. Functional pathology of the tricuspid valve suggest possible leaflet restriction involving the septal leaflet of the tricuspid valve. 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 The remainder of the tricuspid valve appears reasonably normal. Previous echocardiograms have documented significant septal flattening due to right ventricular pressure and volume overload. I agree the patient would likely benefit from tricuspid valve repair,and based upon review of the patient's TEE I feel there is a relatively high likelihood that her valve should be repairable.   Coronary CT angiogram revealed no signs of significant coronary artery disease.  She appears to be an acceptable candidate for minimally invasive approach for surgery.    Plan:  The  patientand her husband were againcounseled at length regarding the indications, risks and potential benefits of tricuspidvalve repair. The rationale for elective surgery has been explained, including a comparison between surgery and continued medical therapy with close follow-up. The likelihood of successful and durable valve repair has been discussed with particular reference to the findings of their recent echocardiogram. Alternative surgical approaches have been discussed including a comparison between conventional sternotomy and minimally-invasive  techniques. The relative risks and benefits of each have been reviewed as they pertain to the patient's specific circumstances, and expectations for the patient's postoperative convalescence has been discussed.   We plan to proceed with surgery on October 21, 2019.  The patient understands and accepts all potential risks of surgery including but not limited to risk of death, stroke or other neurologic complication, myocardial infarction, congestive heart failure, respiratory failure, renal failure, bleeding requiring transfusion and/or reexploration, arrhythmia, infection or other wound complications, pneumonia, pleural and/or pericardial effusion, pulmonary embolus, aortic dissection or other major vascular complication, or delayed complications related to valve repair or replacement including but not limited to structural valve deterioration and failure, thrombosis, embolization, endocarditis, or paravalvular leak.  Specific risks potentially related to the minimally-invasive approach were discussed at length, including but not limited to risk of conversion to full or partial sternotomy, aortic dissection or other major vascular complication, unilateral acute lung injury or pulmonary edema, phrenic nerve dysfunction or paralysis, rib fracture, chronic pain, lung hernia, or lymphocele. All of their questions have been answered.     Valentina Gu. Roxy Manns, MD 09/21/2019 4:29 PM

## 2019-10-19 NOTE — Progress Notes (Signed)
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  Patient was initially seen in consultation on August 25, 2019 and she was seen more recently on September 21, 2019 at which time we made tentative plans for her to undergo surgery on October 21, 2019.  She received the second dose of her COVID-19 vaccine last week and returns to our office today with plans to proceed with surgery.  She reports no new problems or complaints although she does report that she has been having some hypoglycemia in the mornings since she has been taking Metformin.  Because of this she does not always take Metformin on a daily basis but she has been taking it recently.   Current Outpatient Medications  Medication Sig Dispense Refill  . furosemide (LASIX) 40 MG tablet Take 1 tablet (40 mg total) by mouth 3 (three) times a week. Every Monday, Wednesday and Friday (Patient taking differently: Take 40 mg by mouth every Monday, Wednesday, and Friday. ) 15 tablet 6  . glucose 4 GM chewable tablet Chew 4 tablets by mouth in the morning.    . hydroxychloroquine (PLAQUENIL) 200 MG tablet Take 400 mg by mouth daily.     Marland Kitchen levothyroxine (SYNTHROID) 50 MCG tablet Take 50 mcg by mouth daily before breakfast.     . losartan (COZAAR) 50 MG tablet Take 50 mg by mouth daily.    . metFORMIN (GLUCOPHAGE) 500 MG tablet Take 500-1,000 mg by mouth  See admin instructions. Take 1000 mg by mouth in the morning and 500 mg in the evening    . potassium chloride SA (KLOR-CON) 20 MEQ tablet Take 1 tablet (20 mEq total) by mouth 3 (three) times a week. Every Monday, Wednesday, and Friday 15 tablet 6  . spironolactone (ALDACTONE) 25 MG tablet TAKE (1/2) TABLET DAILY. (Patient taking differently: Take 12.5 mg by mouth every Monday, Wednesday, and Friday. ) 15 tablet 2   No current facility-administered medications for this visit.      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 She describes stable symptoms of exertional shortness of breath, fatigue, and lower extremity edema consistent with chronic right-sided congestive heart failure, New York Heart Association functional class IIb. Symptoms have persisted despite optimal medical therapy with long-term  diuretic use. The patient has also had some intermittent abdominal swelling and loss of appetite with significant weight loss.   I have personally reviewed the patient's recent transesophageal echocardiogram, cardiac MRI, transthoracic echocardiograms, diagnostic cardiac catheterizations, coronary CT angiogram and CTA of the aorta and iliac vessels. The patient has mild to moderate right ventricular chamber enlargement with mild to moderately reduced right ventricular function. There is severe right atrial enlargement and severe tricuspid regurgitation.  Functional pathology of the tricuspid valve suggest possible leaflet restriction involving the septal leaflet of the tricuspid valve. 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 The remainder of the tricuspid valve appears reasonably normal. Previous echocardiograms have documented significant septal flattening due to right ventricular pressure and volume overload. I agree the patient would likely benefit from tricuspid valve repair,and based upon review of the patient's TEE I feel there is a relatively high likelihood that her valve should be repairable.   Coronary CT angiogram revealed no signs of significant coronary artery disease.  She appears to be an acceptable candidate for minimally invasive approach for surgery.    Plan:  The patientand her husband were againcounseled at length regarding the indications, risks and potential benefits of tricuspidvalve repair. The rationale for elective surgery has been explained, including a comparison between surgery and continued medical therapy with close follow-up. The likelihood of successful and durable valve repair has been discussed with particular reference to the findings of their recent echocardiogram. Alternative surgical approaches have been discussed including a comparison between conventional sternotomy and minimally-invasive techniques. The relative risks and benefits of each have been reviewed as they pertain to the patient's specific circumstances, and expectations for the patient's postoperative convalescence has been discussed.   We plan to proceed with surgery on October 21, 2019 as previously planned.  She has been instructed to stop taking hydroxychloroquine and Metformin between now and the time of surgery.  The patient understands and accepts all potential risks of surgery including but not limited to risk of death, stroke or other  neurologic complication, myocardial infarction, congestive heart failure, respiratory failure, renal failure, bleeding requiring transfusion and/or reexploration, arrhythmia, infection or other wound complications, pneumonia, pleural and/or pericardial effusion, pulmonary embolus, aortic dissection or other major vascular complication, or delayed complications related to valve repair or replacement including but not limited to structural valve deterioration and failure, thrombosis, embolization, endocarditis, or paravalvular leak.  Specific risks potentially related to the minimally-invasive approach were discussed at length, including but not limited to risk of conversion to full or partial sternotomy, aortic dissection or other major vascular complication, unilateral acute lung injury or pulmonary edema, phrenic nerve dysfunction or paralysis, rib fracture, chronic pain, lung hernia, or lymphocele. All of their questions have been answered.    I spent in excess of 15 minutes during the conduct of this office consultation and >50% of this time involved direct face-to-face encounter with the patient for counseling and/or coordination of their care.    Valentina Gu. Roxy Manns, MD 10/19/2019 1:31 PM

## 2019-10-20 MED ORDER — MANNITOL 20 % IV SOLN
INTRAVENOUS | Status: DC
  Filled 2019-10-20: qty 13

## 2019-10-20 MED ORDER — LEVOFLOXACIN IN D5W 500 MG/100ML IV SOLN
500.0000 mg | INTRAVENOUS | Status: AC
  Administered 2019-10-21: 500 mg via INTRAVENOUS
  Filled 2019-10-20: qty 100

## 2019-10-20 MED ORDER — POTASSIUM CHLORIDE 2 MEQ/ML IV SOLN
80.0000 meq | INTRAVENOUS | Status: DC
  Filled 2019-10-20: qty 40

## 2019-10-20 MED ORDER — PLASMA-LYTE 148 IV SOLN
INTRAVENOUS | Status: DC
  Filled 2019-10-20: qty 2.5

## 2019-10-20 MED ORDER — TRANEXAMIC ACID (OHS) PUMP PRIME SOLUTION
2.0000 mg/kg | INTRAVENOUS | Status: DC
  Filled 2019-10-20: qty 1.42

## 2019-10-20 MED ORDER — TRANEXAMIC ACID (OHS) BOLUS VIA INFUSION
15.0000 mg/kg | INTRAVENOUS | Status: AC
  Administered 2019-10-21: 1062 mg via INTRAVENOUS
  Filled 2019-10-20: qty 1062

## 2019-10-20 MED ORDER — NOREPINEPHRINE 4 MG/250ML-% IV SOLN
0.0000 ug/min | INTRAVENOUS | Status: DC
  Filled 2019-10-20: qty 250

## 2019-10-20 MED ORDER — DEXMEDETOMIDINE HCL IN NACL 400 MCG/100ML IV SOLN
0.1000 ug/kg/h | INTRAVENOUS | Status: AC
  Administered 2019-10-21: .2 ug/kg/h via INTRAVENOUS
  Filled 2019-10-20: qty 100

## 2019-10-20 MED ORDER — VANCOMYCIN HCL 1250 MG/250ML IV SOLN
1250.0000 mg | INTRAVENOUS | Status: AC
  Administered 2019-10-21: 1250 mg via INTRAVENOUS
  Filled 2019-10-20: qty 250

## 2019-10-20 MED ORDER — SODIUM CHLORIDE 0.9 % IV SOLN
INTRAVENOUS | Status: DC
  Filled 2019-10-20: qty 30

## 2019-10-20 MED ORDER — GLUTARALDEHYDE 0.625% SOAKING SOLUTION
TOPICAL | Status: DC
  Filled 2019-10-20: qty 50

## 2019-10-20 MED ORDER — MILRINONE LACTATE IN DEXTROSE 20-5 MG/100ML-% IV SOLN
0.3000 ug/kg/min | INTRAVENOUS | Status: DC
  Filled 2019-10-20: qty 100

## 2019-10-20 MED ORDER — PHENYLEPHRINE HCL-NACL 20-0.9 MG/250ML-% IV SOLN
30.0000 ug/min | INTRAVENOUS | Status: AC
  Administered 2019-10-21: 40 ug/min via INTRAVENOUS
  Filled 2019-10-20: qty 250

## 2019-10-20 MED ORDER — VANCOMYCIN HCL 1000 MG IV SOLR
INTRAVENOUS | Status: DC
  Filled 2019-10-20: qty 1000

## 2019-10-20 MED ORDER — INSULIN REGULAR(HUMAN) IN NACL 100-0.9 UT/100ML-% IV SOLN
INTRAVENOUS | Status: AC
  Administered 2019-10-21: 3.2 [IU]/h via INTRAVENOUS
  Filled 2019-10-20: qty 100

## 2019-10-20 MED ORDER — EPINEPHRINE HCL 5 MG/250ML IV SOLN IN NS
0.0000 ug/min | INTRAVENOUS | Status: DC
  Filled 2019-10-20: qty 250

## 2019-10-20 MED ORDER — TRANEXAMIC ACID 1000 MG/10ML IV SOLN
1.5000 mg/kg/h | INTRAVENOUS | Status: AC
  Administered 2019-10-21: 1.5 mg/kg/h via INTRAVENOUS
  Filled 2019-10-20: qty 25

## 2019-10-20 MED ORDER — NITROGLYCERIN IN D5W 200-5 MCG/ML-% IV SOLN
2.0000 ug/min | INTRAVENOUS | Status: AC
  Administered 2019-10-21: 16.6 ug/min via INTRAVENOUS
  Filled 2019-10-20: qty 250

## 2019-10-20 NOTE — Anesthesia Preprocedure Evaluation (Addendum)
Anesthesia Evaluation  Patient identified by MRN, date of birth, ID band Patient awake    Reviewed: Allergy & Precautions, H&P , NPO status , Patient's Chart, lab work & pertinent test results  Airway Mallampati: II   Neck ROM: full    Dental   Pulmonary neg pulmonary ROS,    breath sounds clear to auscultation       Cardiovascular hypertension, + Valvular Problems/Murmurs  Rhythm:regular Rate:Normal  Severe TR. Pulmonary HTN. Severely dilated RA. Preserved LV function.   Neuro/Psych    GI/Hepatic   Endo/Other  diabetes, Type 2Hypothyroidism   Renal/GU Renal InsufficiencyRenal diseaseLupus nephritis     Musculoskeletal Avascular necrosis b/l hips   Abdominal   Peds  Hematology  (+) Blood dyscrasia, anemia , Lupus.   Anesthesia Other Findings   Reproductive/Obstetrics                            Anesthesia Physical Anesthesia Plan  ASA: III  Anesthesia Plan: General   Post-op Pain Management:    Induction: Intravenous  PONV Risk Score and Plan: 3 and Ondansetron, Dexamethasone, Midazolam and Treatment may vary due to age or medical condition  Airway Management Planned: Oral ETT  Additional Equipment: Arterial line and CVP  Intra-op Plan:   Post-operative Plan: Post-operative intubation/ventilation  Informed Consent: I have reviewed the patients History and Physical, chart, labs and discussed the procedure including the risks, benefits and alternatives for the proposed anesthesia with the patient or authorized representative who has indicated his/her understanding and acceptance.       Plan Discussed with: CRNA, Anesthesiologist and Surgeon  Anesthesia Plan Comments: (DMII on metformin with episodes of hypoglycemia, followed by endocrinology Dr. Chalmers Cater. Pt was mildly hypoglycemic on arrival to PAT with BG 51. Up to 85 after eating snack. Per Dr. 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  She has been started on Cytoxan and daytime hypoglycemia has improved.  She continues to have nocturnal hypoglycemia.  Patient advised to start a protein snack at midnight and 3 AM."  Patient is also using freestyle libre 14-day continuous glucose monitor.  A1c on preop labs of 7.3. Pt was advised to check BG preoperatively and treat per preop guidelines which were provided at PAT. She was also advised to followup with Dr. Chalmers Cater to rediscuss hypoglycemia. I also reached out to Levonne Spiller, RN to make sure Dr. Roxy Manns is aware of pt's hypoglycemic events.   She has been instructed by Dr. Roxy Manns to hold Cytoxan and Plaquenil 2 days preop.  Preop labs also notable for anemia with hemoglobin 8.1 (appears to be chronic and near baseline).  Dr. Roxy Manns has reviewed per Epic.)     Anesthesia Quick Evaluation

## 2019-10-21 ENCOUNTER — Inpatient Hospital Stay (HOSPITAL_COMMUNITY): Payer: BC Managed Care – PPO | Admitting: Certified Registered Nurse Anesthetist

## 2019-10-21 ENCOUNTER — Inpatient Hospital Stay (HOSPITAL_COMMUNITY): Payer: BC Managed Care – PPO

## 2019-10-21 ENCOUNTER — Encounter (HOSPITAL_COMMUNITY): Payer: Self-pay | Admitting: Thoracic Surgery (Cardiothoracic Vascular Surgery)

## 2019-10-21 ENCOUNTER — Inpatient Hospital Stay (HOSPITAL_COMMUNITY): Payer: BC Managed Care – PPO | Admitting: Vascular Surgery

## 2019-10-21 ENCOUNTER — Inpatient Hospital Stay (HOSPITAL_COMMUNITY)
Admission: RE | Disposition: E | Payer: Self-pay | Source: Home / Self Care | Attending: Thoracic Surgery (Cardiothoracic Vascular Surgery)

## 2019-10-21 ENCOUNTER — Inpatient Hospital Stay (HOSPITAL_COMMUNITY)
Admission: RE | Admit: 2019-10-21 | Discharge: 2019-11-14 | DRG: 003 | Disposition: E | Payer: BC Managed Care – PPO | Attending: Thoracic Surgery (Cardiothoracic Vascular Surgery) | Admitting: Thoracic Surgery (Cardiothoracic Vascular Surgery)

## 2019-10-21 ENCOUNTER — Inpatient Hospital Stay (HOSPITAL_COMMUNITY): Payer: BC Managed Care – PPO | Admitting: Registered Nurse

## 2019-10-21 ENCOUNTER — Other Ambulatory Visit: Payer: Self-pay

## 2019-10-21 ENCOUNTER — Encounter (HOSPITAL_COMMUNITY)
Admission: RE | Disposition: E | Payer: Self-pay | Source: Home / Self Care | Attending: Thoracic Surgery (Cardiothoracic Vascular Surgery)

## 2019-10-21 DIAGNOSIS — E039 Hypothyroidism, unspecified: Secondary | ICD-10-CM | POA: Diagnosis present

## 2019-10-21 DIAGNOSIS — G931 Anoxic brain damage, not elsewhere classified: Secondary | ICD-10-CM | POA: Diagnosis not present

## 2019-10-21 DIAGNOSIS — J9690 Respiratory failure, unspecified, unspecified whether with hypoxia or hypercapnia: Secondary | ICD-10-CM

## 2019-10-21 DIAGNOSIS — I9789 Other postprocedural complications and disorders of the circulatory system, not elsewhere classified: Secondary | ICD-10-CM

## 2019-10-21 DIAGNOSIS — I462 Cardiac arrest due to underlying cardiac condition: Secondary | ICD-10-CM | POA: Diagnosis not present

## 2019-10-21 DIAGNOSIS — K567 Ileus, unspecified: Secondary | ICD-10-CM | POA: Diagnosis not present

## 2019-10-21 DIAGNOSIS — J95821 Acute postprocedural respiratory failure: Secondary | ICD-10-CM | POA: Diagnosis not present

## 2019-10-21 DIAGNOSIS — J942 Hemothorax: Secondary | ICD-10-CM | POA: Diagnosis not present

## 2019-10-21 DIAGNOSIS — Z452 Encounter for adjustment and management of vascular access device: Secondary | ICD-10-CM

## 2019-10-21 DIAGNOSIS — Z66 Do not resuscitate: Secondary | ICD-10-CM

## 2019-10-21 DIAGNOSIS — IMO0002 Reserved for concepts with insufficient information to code with codable children: Secondary | ICD-10-CM | POA: Diagnosis present

## 2019-10-21 DIAGNOSIS — E8809 Other disorders of plasma-protein metabolism, not elsewhere classified: Secondary | ICD-10-CM | POA: Diagnosis not present

## 2019-10-21 DIAGNOSIS — G40A01 Absence epileptic syndrome, not intractable, with status epilepticus: Secondary | ICD-10-CM | POA: Diagnosis not present

## 2019-10-21 DIAGNOSIS — D693 Immune thrombocytopenic purpura: Secondary | ICD-10-CM | POA: Diagnosis present

## 2019-10-21 DIAGNOSIS — D62 Acute posthemorrhagic anemia: Secondary | ICD-10-CM | POA: Diagnosis not present

## 2019-10-21 DIAGNOSIS — D849 Immunodeficiency, unspecified: Secondary | ICD-10-CM | POA: Diagnosis present

## 2019-10-21 DIAGNOSIS — G40901 Epilepsy, unspecified, not intractable, with status epilepticus: Secondary | ICD-10-CM | POA: Diagnosis not present

## 2019-10-21 DIAGNOSIS — J9811 Atelectasis: Secondary | ICD-10-CM

## 2019-10-21 DIAGNOSIS — Z6831 Body mass index (BMI) 31.0-31.9, adult: Secondary | ICD-10-CM

## 2019-10-21 DIAGNOSIS — Z95828 Presence of other vascular implants and grafts: Secondary | ICD-10-CM

## 2019-10-21 DIAGNOSIS — I132 Hypertensive heart and chronic kidney disease with heart failure and with stage 5 chronic kidney disease, or end stage renal disease: Secondary | ICD-10-CM | POA: Diagnosis present

## 2019-10-21 DIAGNOSIS — Z515 Encounter for palliative care: Secondary | ICD-10-CM

## 2019-10-21 DIAGNOSIS — I4892 Unspecified atrial flutter: Secondary | ICD-10-CM | POA: Diagnosis not present

## 2019-10-21 DIAGNOSIS — R634 Abnormal weight loss: Secondary | ICD-10-CM | POA: Diagnosis present

## 2019-10-21 DIAGNOSIS — I509 Heart failure, unspecified: Secondary | ICD-10-CM

## 2019-10-21 DIAGNOSIS — N186 End stage renal disease: Secondary | ICD-10-CM | POA: Diagnosis present

## 2019-10-21 DIAGNOSIS — Z9911 Dependence on respirator [ventilator] status: Secondary | ICD-10-CM | POA: Diagnosis not present

## 2019-10-21 DIAGNOSIS — R600 Localized edema: Secondary | ICD-10-CM | POA: Diagnosis present

## 2019-10-21 DIAGNOSIS — D689 Coagulation defect, unspecified: Secondary | ICD-10-CM | POA: Diagnosis not present

## 2019-10-21 DIAGNOSIS — I5082 Biventricular heart failure: Secondary | ICD-10-CM | POA: Diagnosis present

## 2019-10-21 DIAGNOSIS — M329 Systemic lupus erythematosus, unspecified: Secondary | ICD-10-CM | POA: Diagnosis present

## 2019-10-21 DIAGNOSIS — M3214 Glomerular disease in systemic lupus erythematosus: Secondary | ICD-10-CM | POA: Diagnosis present

## 2019-10-21 DIAGNOSIS — Z978 Presence of other specified devices: Secondary | ICD-10-CM | POA: Diagnosis not present

## 2019-10-21 DIAGNOSIS — J9601 Acute respiratory failure with hypoxia: Secondary | ICD-10-CM | POA: Diagnosis not present

## 2019-10-21 DIAGNOSIS — Z8249 Family history of ischemic heart disease and other diseases of the circulatory system: Secondary | ICD-10-CM

## 2019-10-21 DIAGNOSIS — K9189 Other postprocedural complications and disorders of digestive system: Secondary | ICD-10-CM | POA: Diagnosis not present

## 2019-10-21 DIAGNOSIS — I2721 Secondary pulmonary arterial hypertension: Secondary | ICD-10-CM | POA: Diagnosis present

## 2019-10-21 DIAGNOSIS — I5023 Acute on chronic systolic (congestive) heart failure: Secondary | ICD-10-CM | POA: Diagnosis present

## 2019-10-21 DIAGNOSIS — T8119XA Other postprocedural shock, initial encounter: Secondary | ICD-10-CM | POA: Diagnosis not present

## 2019-10-21 DIAGNOSIS — N179 Acute kidney failure, unspecified: Secondary | ICD-10-CM | POA: Diagnosis not present

## 2019-10-21 DIAGNOSIS — I50813 Acute on chronic right heart failure: Secondary | ICD-10-CM | POA: Diagnosis not present

## 2019-10-21 DIAGNOSIS — I083 Combined rheumatic disorders of mitral, aortic and tricuspid valves: Secondary | ICD-10-CM | POA: Diagnosis present

## 2019-10-21 DIAGNOSIS — G9341 Metabolic encephalopathy: Secondary | ICD-10-CM | POA: Diagnosis not present

## 2019-10-21 DIAGNOSIS — B029 Zoster without complications: Secondary | ICD-10-CM | POA: Diagnosis present

## 2019-10-21 DIAGNOSIS — E11649 Type 2 diabetes mellitus with hypoglycemia without coma: Secondary | ICD-10-CM | POA: Diagnosis not present

## 2019-10-21 DIAGNOSIS — L8990 Pressure ulcer of unspecified site, unspecified stage: Secondary | ICD-10-CM | POA: Insufficient documentation

## 2019-10-21 DIAGNOSIS — L89152 Pressure ulcer of sacral region, stage 2: Secondary | ICD-10-CM | POA: Diagnosis not present

## 2019-10-21 DIAGNOSIS — Z888 Allergy status to other drugs, medicaments and biological substances status: Secondary | ICD-10-CM

## 2019-10-21 DIAGNOSIS — E1165 Type 2 diabetes mellitus with hyperglycemia: Secondary | ICD-10-CM | POA: Diagnosis not present

## 2019-10-21 DIAGNOSIS — R0989 Other specified symptoms and signs involving the circulatory and respiratory systems: Secondary | ICD-10-CM

## 2019-10-21 DIAGNOSIS — Z7189 Other specified counseling: Secondary | ICD-10-CM

## 2019-10-21 DIAGNOSIS — E778 Other disorders of glycoprotein metabolism: Secondary | ICD-10-CM | POA: Diagnosis not present

## 2019-10-21 DIAGNOSIS — I4901 Ventricular fibrillation: Secondary | ICD-10-CM | POA: Diagnosis not present

## 2019-10-21 DIAGNOSIS — Z79890 Hormone replacement therapy (postmenopausal): Secondary | ICD-10-CM

## 2019-10-21 DIAGNOSIS — I312 Hemopericardium, not elsewhere classified: Secondary | ICD-10-CM | POA: Diagnosis present

## 2019-10-21 DIAGNOSIS — Z9281 Personal history of extracorporeal membrane oxygenation (ECMO): Secondary | ICD-10-CM

## 2019-10-21 DIAGNOSIS — R092 Respiratory arrest: Secondary | ICD-10-CM | POA: Diagnosis not present

## 2019-10-21 DIAGNOSIS — I4891 Unspecified atrial fibrillation: Secondary | ICD-10-CM | POA: Diagnosis not present

## 2019-10-21 DIAGNOSIS — Z7984 Long term (current) use of oral hypoglycemic drugs: Secondary | ICD-10-CM

## 2019-10-21 DIAGNOSIS — R569 Unspecified convulsions: Secondary | ICD-10-CM | POA: Diagnosis not present

## 2019-10-21 DIAGNOSIS — Z79899 Other long term (current) drug therapy: Secondary | ICD-10-CM

## 2019-10-21 DIAGNOSIS — Z96643 Presence of artificial hip joint, bilateral: Secondary | ICD-10-CM | POA: Diagnosis present

## 2019-10-21 DIAGNOSIS — M879 Osteonecrosis, unspecified: Secondary | ICD-10-CM | POA: Diagnosis present

## 2019-10-21 DIAGNOSIS — I361 Nonrheumatic tricuspid (valve) insufficiency: Secondary | ICD-10-CM

## 2019-10-21 DIAGNOSIS — Z95811 Presence of heart assist device: Secondary | ICD-10-CM | POA: Diagnosis not present

## 2019-10-21 DIAGNOSIS — E872 Acidosis: Secondary | ICD-10-CM | POA: Diagnosis not present

## 2019-10-21 DIAGNOSIS — Z9689 Presence of other specified functional implants: Secondary | ICD-10-CM

## 2019-10-21 DIAGNOSIS — I071 Rheumatic tricuspid insufficiency: Secondary | ICD-10-CM | POA: Diagnosis present

## 2019-10-21 DIAGNOSIS — I272 Pulmonary hypertension, unspecified: Secondary | ICD-10-CM | POA: Diagnosis present

## 2019-10-21 DIAGNOSIS — E669 Obesity, unspecified: Secondary | ICD-10-CM | POA: Diagnosis present

## 2019-10-21 DIAGNOSIS — R570 Cardiogenic shock: Secondary | ICD-10-CM | POA: Diagnosis not present

## 2019-10-21 DIAGNOSIS — R34 Anuria and oliguria: Secondary | ICD-10-CM | POA: Diagnosis not present

## 2019-10-21 DIAGNOSIS — Z9889 Other specified postprocedural states: Secondary | ICD-10-CM

## 2019-10-21 DIAGNOSIS — D6959 Other secondary thrombocytopenia: Secondary | ICD-10-CM | POA: Diagnosis present

## 2019-10-21 DIAGNOSIS — Z880 Allergy status to penicillin: Secondary | ICD-10-CM

## 2019-10-21 DIAGNOSIS — E1122 Type 2 diabetes mellitus with diabetic chronic kidney disease: Secondary | ICD-10-CM | POA: Diagnosis present

## 2019-10-21 HISTORY — PX: MINIMALLY INVASIVE TRICUSPID VALVE REPAIR: SHX5975

## 2019-10-21 HISTORY — PX: EXPLORATION POST OPERATIVE OPEN HEART: SHX5061

## 2019-10-21 HISTORY — PX: TEE WITHOUT CARDIOVERSION: SHX5443

## 2019-10-21 HISTORY — DX: Other specified postprocedural states: Z98.890

## 2019-10-21 HISTORY — PX: CANNULATION FOR ECMO (EXTRACORPOREAL MEMBRANE OXYGENATION) BEDSIDE: SHX6795

## 2019-10-21 LAB — PREPARE RBC (CROSSMATCH)

## 2019-10-21 LAB — CBC WITH DIFFERENTIAL/PLATELET
Abs Immature Granulocytes: 0.02 10*3/uL (ref 0.00–0.07)
Basophils Absolute: 0 10*3/uL (ref 0.0–0.1)
Basophils Relative: 0 %
Eosinophils Absolute: 0 10*3/uL (ref 0.0–0.5)
Eosinophils Relative: 0 %
HCT: 23.3 % — ABNORMAL LOW (ref 36.0–46.0)
Hemoglobin: 7.7 g/dL — ABNORMAL LOW (ref 12.0–15.0)
Immature Granulocytes: 1 %
Lymphocytes Relative: 8 %
Lymphs Abs: 0.2 10*3/uL — ABNORMAL LOW (ref 0.7–4.0)
MCH: 28.6 pg (ref 26.0–34.0)
MCHC: 33 g/dL (ref 30.0–36.0)
MCV: 86.6 fL (ref 80.0–100.0)
Monocytes Absolute: 0.2 10*3/uL (ref 0.1–1.0)
Monocytes Relative: 7 %
Neutro Abs: 2.5 10*3/uL (ref 1.7–7.7)
Neutrophils Relative %: 84 %
Platelets: 77 10*3/uL — ABNORMAL LOW (ref 150–400)
RBC: 2.69 MIL/uL — ABNORMAL LOW (ref 3.87–5.11)
RDW: 18.8 % — ABNORMAL HIGH (ref 11.5–15.5)
WBC: 3 10*3/uL — ABNORMAL LOW (ref 4.0–10.5)
nRBC: 0 % (ref 0.0–0.2)

## 2019-10-21 LAB — POCT I-STAT 7, (LYTES, BLD GAS, ICA,H+H)
Acid-base deficit: 12 mmol/L — ABNORMAL HIGH (ref 0.0–2.0)
Acid-base deficit: 4 mmol/L — ABNORMAL HIGH (ref 0.0–2.0)
Bicarbonate: 12.2 mmol/L — ABNORMAL LOW (ref 20.0–28.0)
Bicarbonate: 21.8 mmol/L (ref 20.0–28.0)
Calcium, Ion: 0.79 mmol/L — CL (ref 1.15–1.40)
Calcium, Ion: 1.07 mmol/L — ABNORMAL LOW (ref 1.15–1.40)
HCT: 16 % — ABNORMAL LOW (ref 36.0–46.0)
HCT: 21 % — ABNORMAL LOW (ref 36.0–46.0)
Hemoglobin: 5.4 g/dL — CL (ref 12.0–15.0)
Hemoglobin: 7.1 g/dL — ABNORMAL LOW (ref 12.0–15.0)
O2 Saturation: 99 %
O2 Saturation: 99 %
Patient temperature: 97.8
Patient temperature: 97.8
Potassium: 2.6 mmol/L — CL (ref 3.5–5.1)
Potassium: 4.4 mmol/L (ref 3.5–5.1)
Sodium: 151 mmol/L — ABNORMAL HIGH (ref 135–145)
Sodium: 144 mmol/L (ref 135–145)
TCO2: 13 mmol/L — ABNORMAL LOW (ref 22–32)
TCO2: 23 mmol/L (ref 22–32)
pCO2 arterial: 22.1 mmHg — ABNORMAL LOW (ref 32.0–48.0)
pCO2 arterial: 39.3 mmHg (ref 32.0–48.0)
pH, Arterial: 7.349 — ABNORMAL LOW (ref 7.350–7.450)
pH, Arterial: 7.351 (ref 7.350–7.450)
pO2, Arterial: 158 mmHg — ABNORMAL HIGH (ref 83.0–108.0)
pO2, Arterial: 145 mmHg — ABNORMAL HIGH (ref 83.0–108.0)

## 2019-10-21 LAB — CBC
HCT: 28.7 % — ABNORMAL LOW (ref 36.0–46.0)
HCT: 24 % — ABNORMAL LOW (ref 36.0–46.0)
Hemoglobin: 9.6 g/dL — ABNORMAL LOW (ref 12.0–15.0)
Hemoglobin: 7.8 g/dL — ABNORMAL LOW (ref 12.0–15.0)
MCH: 28.7 pg (ref 26.0–34.0)
MCH: 28.2 pg (ref 26.0–34.0)
MCHC: 33.4 g/dL (ref 30.0–36.0)
MCHC: 32.5 g/dL (ref 30.0–36.0)
MCV: 85.9 fL (ref 80.0–100.0)
MCV: 86.6 fL (ref 80.0–100.0)
Platelets: 88 10*3/uL — ABNORMAL LOW (ref 150–400)
Platelets: 64 10*3/uL — ABNORMAL LOW (ref 150–400)
RBC: 3.34 MIL/uL — ABNORMAL LOW (ref 3.87–5.11)
RBC: 2.77 MIL/uL — ABNORMAL LOW (ref 3.87–5.11)
RDW: 18.6 % — ABNORMAL HIGH (ref 11.5–15.5)
RDW: 16.7 % — ABNORMAL HIGH (ref 11.5–15.5)
WBC: 3.2 10*3/uL — ABNORMAL LOW (ref 4.0–10.5)
WBC: 3.4 10*3/uL — ABNORMAL LOW (ref 4.0–10.5)
nRBC: 0 % (ref 0.0–0.2)
nRBC: 0 % (ref 0.0–0.2)

## 2019-10-21 LAB — ECHO INTRAOPERATIVE TEE
Height: 67 in
Weight: 2504.43 oz

## 2019-10-21 LAB — PROTIME-INR
INR: 1.4 — ABNORMAL HIGH (ref 0.8–1.2)
Prothrombin Time: 17.1 seconds — ABNORMAL HIGH (ref 11.4–15.2)

## 2019-10-21 LAB — GLUCOSE, CAPILLARY
Glucose-Capillary: 64 mg/dL — ABNORMAL LOW (ref 70–99)
Glucose-Capillary: 108 mg/dL — ABNORMAL HIGH (ref 70–99)
Glucose-Capillary: 20 mg/dL — CL (ref 70–99)
Glucose-Capillary: 243 mg/dL — ABNORMAL HIGH (ref 70–99)
Glucose-Capillary: 176 mg/dL — ABNORMAL HIGH (ref 70–99)
Glucose-Capillary: 119 mg/dL — ABNORMAL HIGH (ref 70–99)

## 2019-10-21 LAB — PLATELET COUNT: Platelets: 38 10*3/uL — ABNORMAL LOW (ref 150–400)

## 2019-10-21 LAB — HEMOGLOBIN AND HEMATOCRIT, BLOOD
HCT: 23.3 % — ABNORMAL LOW (ref 36.0–46.0)
Hemoglobin: 7.7 g/dL — ABNORMAL LOW (ref 12.0–15.0)

## 2019-10-21 LAB — APTT: aPTT: 43 seconds — ABNORMAL HIGH (ref 24–36)

## 2019-10-21 LAB — FIBRINOGEN: Fibrinogen: <60 mg/dL — CL (ref 210–475)

## 2019-10-21 SURGERY — REPAIR, TRICUSPID VALVE, TRANSCATHETER
Anesthesia: General | Site: Chest | Laterality: Right

## 2019-10-21 SURGERY — EXPLORATION POST OPERATIVE OPEN HEART
Anesthesia: General | Site: Chest

## 2019-10-21 MED ORDER — ACETAMINOPHEN 160 MG/5ML PO SOLN: 1000.0000 mg | Freq: Four times a day (QID) | ORAL | Status: DC

## 2019-10-21 MED ORDER — MORPHINE SULFATE (PF) 2 MG/ML IV SOLN
1.0000 mg | INTRAVENOUS | Status: DC | PRN
  Administered 2019-10-29 – 2019-10-31 (×5): 2 mg via INTRAVENOUS
  Filled 2019-10-21: qty 2
  Filled 2019-10-21 (×5): qty 1

## 2019-10-21 MED ORDER — LACTATED RINGERS IV SOLN: INTRAVENOUS | Status: DC

## 2019-10-21 MED ORDER — SODIUM CHLORIDE (PF) 0.9 % IJ SOLN
OROMUCOSAL | Status: DC | PRN
  Administered 2019-10-21 (×3): 4 mL via TOPICAL

## 2019-10-21 MED ORDER — GLUTARALDEHYDE 0.625% SOAKING SOLUTION
TOPICAL | Status: DC | PRN
  Administered 2019-10-21: 1 via TOPICAL

## 2019-10-21 MED ORDER — PROTAMINE SULFATE 10 MG/ML IV SOLN
INTRAVENOUS | Status: DC | PRN
  Administered 2019-10-21: 250 mg via INTRAVENOUS

## 2019-10-21 MED ORDER — ONDANSETRON HCL 4 MG/2ML IJ SOLN
INTRAMUSCULAR | Status: AC
  Filled 2019-10-21: qty 2

## 2019-10-21 MED ORDER — BUPIVACAINE LIPOSOME 1.3 % IJ SUSP
20.0000 mL | Freq: Once | INTRAMUSCULAR | Status: DC
  Filled 2019-10-21: qty 20

## 2019-10-21 MED ORDER — METHYLPREDNISOLONE SODIUM SUCC 125 MG IJ SOLR
125.0000 mg | Freq: Once | INTRAMUSCULAR | Status: AC
  Administered 2019-10-21: 125 mg via INTRAVENOUS
  Filled 2019-10-21: qty 2

## 2019-10-21 MED ORDER — MIDAZOLAM HCL (PF) 10 MG/2ML IJ SOLN
INTRAMUSCULAR | Status: AC
  Filled 2019-10-21: qty 2

## 2019-10-21 MED ORDER — DEXMEDETOMIDINE HCL IN NACL 400 MCG/100ML IV SOLN
0.1000 ug/kg/h | INTRAVENOUS | Status: DC
  Filled 2019-10-21: qty 100

## 2019-10-21 MED ORDER — SODIUM BICARBONATE 8.4 % IV SOLN
INTRAVENOUS | Status: DC | PRN
  Administered 2019-10-21 (×3): 50 meq via INTRAVENOUS

## 2019-10-21 MED ORDER — LACTATED RINGERS IV SOLN: INTRAVENOUS | Status: DC | PRN

## 2019-10-21 MED ORDER — PLASMA-LYTE 148 IV SOLN
INTRAVENOUS | Status: DC | PRN
  Administered 2019-10-21: 500 mL via INTRAVASCULAR

## 2019-10-21 MED ORDER — VANCOMYCIN HCL IN DEXTROSE 1-5 GM/200ML-% IV SOLN
1000.0000 mg | Freq: Once | INTRAVENOUS | Status: DC
  Filled 2019-10-21: qty 200

## 2019-10-21 MED ORDER — SODIUM CHLORIDE 0.45 % IV SOLN: INTRAVENOUS | Status: DC | PRN

## 2019-10-21 MED ORDER — INSULIN REGULAR(HUMAN) IN NACL 100-0.9 UT/100ML-% IV SOLN
INTRAVENOUS | Status: AC
  Administered 2019-10-21: 22:00:00 3.4 [IU]/h via INTRAVENOUS
  Filled 2019-10-21: qty 100

## 2019-10-21 MED ORDER — ACETAMINOPHEN 650 MG RE SUPP: 650.0000 mg | Freq: Once | RECTAL | Status: DC

## 2019-10-21 MED ORDER — SODIUM CHLORIDE 0.9% IV SOLUTION: Freq: Once | INTRAVENOUS | Status: DC

## 2019-10-21 MED ORDER — DEXTROSE 50 % IV SOLN
INTRAVENOUS | Status: DC | PRN
  Administered 2019-10-21: 25 mL via INTRAVENOUS

## 2019-10-21 MED ORDER — SODIUM CHLORIDE 0.9 % IV SOLN: 250.0000 mL | INTRAVENOUS | Status: DC

## 2019-10-21 MED ORDER — ROCURONIUM BROMIDE 10 MG/ML (PF) SYRINGE
PREFILLED_SYRINGE | INTRAVENOUS | Status: DC | PRN
  Administered 2019-10-21: 50 mg via INTRAVENOUS
  Administered 2019-10-21: 100 mg via INTRAVENOUS
  Administered 2019-10-22: 50 mg via INTRAVENOUS

## 2019-10-21 MED ORDER — EPINEPHRINE HCL 5 MG/250ML IV SOLN IN NS
0.0000 ug/min | INTRAVENOUS | Status: AC
  Administered 2019-10-21: 25 ug/min via INTRAVENOUS
  Filled 2019-10-21: qty 250

## 2019-10-21 MED ORDER — HEPARIN SODIUM (PORCINE) 1000 UNIT/ML IJ SOLN
INTRAMUSCULAR | Status: AC
  Filled 2019-10-21: qty 1

## 2019-10-21 MED ORDER — FENTANYL CITRATE (PF) 250 MCG/5ML IJ SOLN
INTRAMUSCULAR | Status: AC
  Filled 2019-10-21: qty 10

## 2019-10-21 MED ORDER — OXYCODONE HCL 5 MG PO TABS: 5.0000 mg | ORAL_TABLET | ORAL | Status: DC | PRN

## 2019-10-21 MED ORDER — SODIUM CHLORIDE 0.9% FLUSH
3.0000 mL | Freq: Two times a day (BID) | INTRAVENOUS | Status: DC
  Administered 2019-10-23 – 2019-10-25 (×3): 3 mL via INTRAVENOUS

## 2019-10-21 MED ORDER — 0.9 % SODIUM CHLORIDE (POUR BTL) OPTIME
TOPICAL | Status: DC | PRN
  Administered 2019-10-21: 5000 mL

## 2019-10-21 MED ORDER — PROTAMINE SULFATE 10 MG/ML IV SOLN
INTRAVENOUS | Status: AC
  Filled 2019-10-21: qty 50

## 2019-10-21 MED ORDER — BISACODYL 10 MG RE SUPP: 10.0000 mg | Freq: Every day | RECTAL | Status: DC

## 2019-10-21 MED ORDER — ASPIRIN 81 MG PO CHEW: 324.0000 mg | CHEWABLE_TABLET | Freq: Every day | ORAL | Status: DC

## 2019-10-21 MED ORDER — FENTANYL CITRATE (PF) 250 MCG/5ML IJ SOLN
INTRAMUSCULAR | Status: DC | PRN
  Administered 2019-10-21: 100 ug via INTRAVENOUS
  Administered 2019-10-21 (×2): 50 ug via INTRAVENOUS
  Administered 2019-10-21: 150 ug via INTRAVENOUS
  Administered 2019-10-21 (×2): 50 ug via INTRAVENOUS
  Administered 2019-10-21: 25 ug via INTRAVENOUS
  Administered 2019-10-21: 150 ug via INTRAVENOUS
  Administered 2019-10-21 (×2): 50 ug via INTRAVENOUS
  Administered 2019-10-21: 25 ug via INTRAVENOUS

## 2019-10-21 MED ORDER — THROMBIN (RECOMBINANT) 20000 UNITS EX SOLR
CUTANEOUS | Status: AC
  Filled 2019-10-21: qty 20000

## 2019-10-21 MED ORDER — SODIUM CHLORIDE 0.9% FLUSH: 3.0000 mL | INTRAVENOUS | Status: DC | PRN

## 2019-10-21 MED ORDER — EPINEPHRINE 1 MG/10ML IJ SOSY
PREFILLED_SYRINGE | INTRAMUSCULAR | Status: AC
  Filled 2019-10-21: qty 10

## 2019-10-21 MED ORDER — TRAMADOL HCL 50 MG PO TABS: 50.0000 mg | ORAL_TABLET | ORAL | Status: DC | PRN

## 2019-10-21 MED ORDER — DOPAMINE-DEXTROSE 3.2-5 MG/ML-% IV SOLN
0.0000 ug/kg/min | INTRAVENOUS | Status: DC
  Filled 2019-10-21: qty 250

## 2019-10-21 MED ORDER — PROTAMINE SULFATE 10 MG/ML IV SOLN
INTRAVENOUS | Status: DC | PRN
  Administered 2019-10-21 (×3): 50 mg via INTRAVENOUS
  Administered 2019-10-21: 70 mg via INTRAVENOUS
  Administered 2019-10-21: 50 mg via INTRAVENOUS

## 2019-10-21 MED ORDER — MAGNESIUM SULFATE 50 % IJ SOLN
40.0000 meq | INTRAMUSCULAR | Status: DC
  Filled 2019-10-21: qty 9.85

## 2019-10-21 MED ORDER — ALBUMIN HUMAN 5 % IV SOLN
250.0000 mL | INTRAVENOUS | Status: AC | PRN
  Administered 2019-10-21 – 2019-10-22 (×4): 12.5 g via INTRAVENOUS
  Filled 2019-10-21 (×5): qty 250

## 2019-10-21 MED ORDER — TRANEXAMIC ACID (OHS) PUMP PRIME SOLUTION
2.0000 mg/kg | INTRAVENOUS | Status: DC
  Filled 2019-10-21: qty 1.42

## 2019-10-21 MED ORDER — EPINEPHRINE PF 1 MG/ML IJ SOLN
INTRAMUSCULAR | Status: DC | PRN
  Administered 2019-10-21 (×5): 1 mg via INTRAVENOUS
  Administered 2019-10-21: .1 mg via INTRAVENOUS
  Administered 2019-10-21 (×2): 1 mg via INTRAVENOUS

## 2019-10-21 MED ORDER — PHENYLEPHRINE 40 MCG/ML (10ML) SYRINGE FOR IV PUSH (FOR BLOOD PRESSURE SUPPORT)
PREFILLED_SYRINGE | INTRAVENOUS | Status: AC
  Filled 2019-10-21: qty 10

## 2019-10-21 MED ORDER — CHLORHEXIDINE GLUCONATE 4 % EX LIQD: 30.0000 mL | CUTANEOUS | Status: DC

## 2019-10-21 MED ORDER — FAMOTIDINE IN NACL 20-0.9 MG/50ML-% IV SOLN
INTRAVENOUS | Status: AC
  Filled 2019-10-21: qty 50

## 2019-10-21 MED ORDER — ONDANSETRON HCL 4 MG/2ML IJ SOLN: 4.0000 mg | Freq: Four times a day (QID) | INTRAMUSCULAR | Status: DC | PRN

## 2019-10-21 MED ORDER — PROPOFOL 10 MG/ML IV BOLUS
INTRAVENOUS | Status: DC | PRN
  Administered 2019-10-21: 120 mg via INTRAVENOUS

## 2019-10-21 MED ORDER — DEXMEDETOMIDINE HCL IN NACL 400 MCG/100ML IV SOLN
0.0000 ug/kg/h | INTRAVENOUS | Status: DC
  Administered 2019-10-21: 0.7 ug/kg/h via INTRAVENOUS
  Administered 2019-10-22 (×2): 0.8 ug/kg/h via INTRAVENOUS
  Administered 2019-10-23: 0.7 ug/kg/h via INTRAVENOUS
  Administered 2019-10-23: 0.8 ug/kg/h via INTRAVENOUS
  Administered 2019-10-24: 0.7 ug/kg/h via INTRAVENOUS
  Administered 2019-10-24 – 2019-10-25 (×5): 0.8 ug/kg/h via INTRAVENOUS
  Filled 2019-10-21 (×13): qty 100

## 2019-10-21 MED ORDER — ONDANSETRON HCL 4 MG/2ML IJ SOLN
INTRAMUSCULAR | Status: DC | PRN
  Administered 2019-10-21: 4 mg via INTRAVENOUS

## 2019-10-21 MED ORDER — ASPIRIN EC 325 MG PO TBEC: 325.0000 mg | DELAYED_RELEASE_TABLET | Freq: Every day | ORAL | Status: DC

## 2019-10-21 MED ORDER — FENTANYL CITRATE (PF) 250 MCG/5ML IJ SOLN
INTRAMUSCULAR | Status: AC
  Filled 2019-10-21: qty 25

## 2019-10-21 MED ORDER — POTASSIUM CHLORIDE 2 MEQ/ML IV SOLN
80.0000 meq | INTRAVENOUS | Status: DC
  Filled 2019-10-21: qty 40

## 2019-10-21 MED ORDER — MAGNESIUM SULFATE 4 GM/100ML IV SOLN
4.0000 g | Freq: Once | INTRAVENOUS | Status: AC
  Administered 2019-10-21: 4 g via INTRAVENOUS
  Filled 2019-10-21: qty 100

## 2019-10-21 MED ORDER — NOREPINEPHRINE 4 MG/250ML-% IV SOLN
INTRAVENOUS | Status: DC | PRN
  Administered 2019-10-21: 50 ug/min via INTRAVENOUS

## 2019-10-21 MED ORDER — CHLORHEXIDINE GLUCONATE 0.12 % MT SOLN: 15.0000 mL | OROMUCOSAL | Status: AC

## 2019-10-21 MED ORDER — METOPROLOL TARTRATE 5 MG/5ML IV SOLN: 2.5000 mg | INTRAVENOUS | Status: DC | PRN

## 2019-10-21 MED ORDER — SODIUM CHLORIDE 0.9 % IV SOLN: INTRAVENOUS | Status: DC

## 2019-10-21 MED ORDER — NITROGLYCERIN IN D5W 200-5 MCG/ML-% IV SOLN: 0.0000 ug/min | INTRAVENOUS | Status: DC

## 2019-10-21 MED ORDER — DEXTROSE 50 % IV SOLN
INTRAVENOUS | Status: AC
  Administered 2019-10-21: 12.5 g via INTRAVENOUS
  Filled 2019-10-21: qty 50

## 2019-10-21 MED ORDER — CHLORHEXIDINE GLUCONATE 0.12 % MT SOLN
15.0000 mL | Freq: Once | OROMUCOSAL | Status: AC
  Administered 2019-10-21: 15 mL via OROMUCOSAL
  Filled 2019-10-21: qty 15

## 2019-10-21 MED ORDER — BUPIVACAINE HCL (PF) 0.5 % IJ SOLN
INTRAMUSCULAR | Status: AC
  Filled 2019-10-21: qty 30

## 2019-10-21 MED ORDER — NITROGLYCERIN IN D5W 200-5 MCG/ML-% IV SOLN
2.0000 ug/min | INTRAVENOUS | Status: DC
  Filled 2019-10-21: qty 250

## 2019-10-21 MED ORDER — CLEVIDIPINE BUTYRATE 0.5 MG/ML IV EMUL
0.0000 mg/h | INTRAVENOUS | Status: DC
  Filled 2019-10-21 (×2): qty 50

## 2019-10-21 MED ORDER — DEXTROSE 50 % IV SOLN
INTRAVENOUS | Status: DC | PRN
  Administered 2019-10-21: 25 g via INTRAVENOUS

## 2019-10-21 MED ORDER — DEXAMETHASONE SODIUM PHOSPHATE 10 MG/ML IJ SOLN
INTRAMUSCULAR | Status: AC
  Filled 2019-10-21: qty 1

## 2019-10-21 MED ORDER — CALCIUM CHLORIDE 10 % IV SOLN
INTRAVENOUS | Status: DC | PRN
  Administered 2019-10-21: 500 mg via INTRAVENOUS
  Administered 2019-10-21: 1000 mg via INTRAVENOUS
  Administered 2019-10-21: 500 mg via INTRAVENOUS
  Administered 2019-10-21 (×2): 1000 mg via INTRAVENOUS

## 2019-10-21 MED ORDER — PLASMA-LYTE 148 IV SOLN
INTRAVENOUS | Status: DC
  Filled 2019-10-21: qty 2.5

## 2019-10-21 MED ORDER — VANCOMYCIN HCL 1000 MG IV SOLR
INTRAVENOUS | Status: DC | PRN
  Administered 2019-10-21: 1000 mL

## 2019-10-21 MED ORDER — ROCURONIUM BROMIDE 10 MG/ML (PF) SYRINGE
PREFILLED_SYRINGE | INTRAVENOUS | Status: DC | PRN
  Administered 2019-10-21: 20 mg via INTRAVENOUS
  Administered 2019-10-21: 10 mg via INTRAVENOUS
  Administered 2019-10-21 (×2): 50 mg via INTRAVENOUS
  Administered 2019-10-21: 30 mg via INTRAVENOUS
  Administered 2019-10-21: 10 mg via INTRAVENOUS

## 2019-10-21 MED ORDER — HEPARIN SODIUM (PORCINE) 1000 UNIT/ML IJ SOLN
INTRAMUSCULAR | Status: DC | PRN
  Administered 2019-10-21: 25000 [IU] via INTRAVENOUS

## 2019-10-21 MED ORDER — DOCUSATE SODIUM 100 MG PO CAPS: 200.0000 mg | ORAL_CAPSULE | Freq: Every day | ORAL | Status: DC

## 2019-10-21 MED ORDER — LIDOCAINE 2% (20 MG/ML) 5 ML SYRINGE
INTRAMUSCULAR | Status: DC | PRN
  Administered 2019-10-21: 100 mg via INTRAVENOUS

## 2019-10-21 MED ORDER — SODIUM CHLORIDE 0.9 % IV SOLN
INTRAVENOUS | Status: DC
  Filled 2019-10-21: qty 30

## 2019-10-21 MED ORDER — LEVOTHYROXINE SODIUM 50 MCG PO TABS
50.0000 ug | ORAL_TABLET | Freq: Every day | ORAL | Status: DC
  Administered 2019-10-24 – 2019-10-26 (×3): 50 ug via ORAL
  Filled 2019-10-21 (×3): qty 1

## 2019-10-21 MED ORDER — DIPHENHYDRAMINE HCL 50 MG/ML IJ SOLN
INTRAMUSCULAR | Status: DC | PRN
  Administered 2019-10-21: 50 mg via INTRAVENOUS

## 2019-10-21 MED ORDER — SODIUM BICARBONATE 8.4 % IV SOLN
INTRAVENOUS | Status: AC
  Filled 2019-10-21: qty 50

## 2019-10-21 MED ORDER — DEXAMETHASONE SODIUM PHOSPHATE 10 MG/ML IJ SOLN
INTRAMUSCULAR | Status: DC | PRN
  Administered 2019-10-21: 10 mg via INTRAVENOUS

## 2019-10-21 MED ORDER — FUROSEMIDE 10 MG/ML IJ SOLN
80.0000 mg | Freq: Once | INTRAMUSCULAR | Status: AC
  Administered 2019-10-21: 80 mg via INTRAVENOUS
  Filled 2019-10-21: qty 8

## 2019-10-21 MED ORDER — SUCCINYLCHOLINE CHLORIDE 200 MG/10ML IV SOSY
PREFILLED_SYRINGE | INTRAVENOUS | Status: AC
  Filled 2019-10-21: qty 10

## 2019-10-21 MED ORDER — VANCOMYCIN HCL 1250 MG/250ML IV SOLN
1250.0000 mg | INTRAVENOUS | Status: AC
  Administered 2019-10-21: 1250 mg via INTRAVENOUS
  Filled 2019-10-21 (×2): qty 250

## 2019-10-21 MED ORDER — DEXTROSE 50 % IV SOLN
INTRAVENOUS | Status: AC
  Filled 2019-10-21: qty 50

## 2019-10-21 MED ORDER — COAGULATION FACTOR VIIA RECOMB 1 MG IV SOLR
45.0000 ug/kg | Freq: Once | INTRAVENOUS | Status: AC
  Administered 2019-10-21: 20:00:00 3000 ug via INTRAVENOUS
  Filled 2019-10-21: qty 1

## 2019-10-21 MED ORDER — BISACODYL 5 MG PO TBEC: 10.0000 mg | DELAYED_RELEASE_TABLET | Freq: Every day | ORAL | Status: DC

## 2019-10-21 MED ORDER — MANNITOL 20 % IV SOLN
Freq: Once | INTRAVENOUS | Status: DC
  Filled 2019-10-21: qty 13

## 2019-10-21 MED ORDER — ROCURONIUM BROMIDE 10 MG/ML (PF) SYRINGE
PREFILLED_SYRINGE | INTRAVENOUS | Status: AC
  Filled 2019-10-21: qty 10

## 2019-10-21 MED ORDER — MILRINONE LACTATE IN DEXTROSE 20-5 MG/100ML-% IV SOLN
0.3000 ug/kg/min | INTRAVENOUS | Status: DC
  Filled 2019-10-21: qty 100

## 2019-10-21 MED ORDER — PANTOPRAZOLE SODIUM 40 MG PO TBEC: 40.0000 mg | DELAYED_RELEASE_TABLET | Freq: Every day | ORAL | Status: DC

## 2019-10-21 MED ORDER — SODIUM CHLORIDE 0.9 % IV SOLN: INTRAVENOUS | Status: DC | PRN

## 2019-10-21 MED ORDER — POTASSIUM CHLORIDE 10 MEQ/50ML IV SOLN: 10.0000 meq | INTRAVENOUS | Status: AC

## 2019-10-21 MED ORDER — FENTANYL CITRATE (PF) 250 MCG/5ML IJ SOLN
INTRAMUSCULAR | Status: AC
  Filled 2019-10-21: qty 20

## 2019-10-21 MED ORDER — METOPROLOL TARTRATE 12.5 MG HALF TABLET
12.5000 mg | ORAL_TABLET | Freq: Once | ORAL | Status: AC
  Administered 2019-10-21: 12.5 mg via ORAL
  Filled 2019-10-21: qty 1

## 2019-10-21 MED ORDER — ACETAMINOPHEN 160 MG/5ML PO SOLN: 650.0000 mg | Freq: Once | ORAL | Status: DC

## 2019-10-21 MED ORDER — FENTANYL CITRATE (PF) 250 MCG/5ML IJ SOLN
INTRAMUSCULAR | Status: DC | PRN
  Administered 2019-10-21: 150 ug via INTRAVENOUS
  Administered 2019-10-21: 500 ug via INTRAVENOUS
  Administered 2019-10-21: 100 ug via INTRAVENOUS
  Administered 2019-10-21 (×2): 250 ug via INTRAVENOUS
  Administered 2019-10-22: 100 ug via INTRAVENOUS
  Administered 2019-10-22: 150 ug via INTRAVENOUS

## 2019-10-21 MED ORDER — PHENYLEPHRINE HCL-NACL 20-0.9 MG/250ML-% IV SOLN
0.0000 ug/min | INTRAVENOUS | Status: DC
  Administered 2019-10-21: 20:00:00 50 ug/min via INTRAVENOUS
  Administered 2019-10-22: 20 ug/min via INTRAVENOUS
  Administered 2019-10-23: 10 ug/min via INTRAVENOUS

## 2019-10-21 MED ORDER — MIDAZOLAM HCL 2 MG/2ML IJ SOLN
2.0000 mg | INTRAMUSCULAR | Status: DC | PRN
  Administered 2019-10-21: 2 mg via INTRAVENOUS
  Administered 2019-10-23: 1 mg via INTRAVENOUS
  Administered 2019-10-23: 2 mg via INTRAVENOUS
  Filled 2019-10-21 (×4): qty 2

## 2019-10-21 MED ORDER — MIDAZOLAM HCL 5 MG/5ML IJ SOLN
INTRAMUSCULAR | Status: DC | PRN
  Administered 2019-10-21 (×2): 2 mg via INTRAVENOUS
  Administered 2019-10-21: 1 mg via INTRAVENOUS
  Administered 2019-10-21: 2 mg via INTRAVENOUS

## 2019-10-21 MED ORDER — FAMOTIDINE IN NACL 20-0.9 MG/50ML-% IV SOLN
20.0000 mg | Freq: Two times a day (BID) | INTRAVENOUS | Status: AC
  Administered 2019-10-22: 20 mg via INTRAVENOUS
  Filled 2019-10-21: qty 50

## 2019-10-21 MED ORDER — HEPARIN SODIUM (PORCINE) 1000 UNIT/ML IJ SOLN
INTRAMUSCULAR | Status: DC | PRN
  Administered 2019-10-21: 27000 [IU] via INTRAVENOUS

## 2019-10-21 MED ORDER — VASOPRESSIN 20 UNIT/ML IV SOLN
INTRAVENOUS | Status: DC | PRN
  Administered 2019-10-21: 5 [IU] via INTRAVENOUS
  Administered 2019-10-21: 3 [IU] via INTRAVENOUS
  Administered 2019-10-21 (×2): 5 [IU] via INTRAVENOUS
  Administered 2019-10-21: 2 [IU] via INTRAVENOUS
  Administered 2019-10-21 (×2): 5 [IU] via INTRAVENOUS

## 2019-10-21 MED ORDER — ACETAMINOPHEN 500 MG PO TABS: 1000.0000 mg | ORAL_TABLET | Freq: Four times a day (QID) | ORAL | Status: DC

## 2019-10-21 MED ORDER — DEXTROSE 50 % IV SOLN: 0.0000 mL | INTRAVENOUS | Status: DC | PRN

## 2019-10-21 MED ORDER — NITROGLYCERIN 0.2 MG/ML ON CALL CATH LAB
INTRAVENOUS | Status: DC | PRN
  Administered 2019-10-21 – 2019-10-22 (×3): 80 ug via INTRAVENOUS

## 2019-10-21 MED ORDER — SODIUM CHLORIDE 0.9 % IV SOLN: INTRAVENOUS | Status: AC

## 2019-10-21 MED ORDER — TRANEXAMIC ACID 1000 MG/10ML IV SOLN
1.5000 mg/kg/h | INTRAVENOUS | Status: DC
  Filled 2019-10-21: qty 25

## 2019-10-21 MED ORDER — TRANEXAMIC ACID (OHS) BOLUS VIA INFUSION
15.0000 mg/kg | INTRAVENOUS | Status: DC
  Filled 2019-10-21: qty 1065

## 2019-10-21 MED ORDER — PROPOFOL 10 MG/ML IV BOLUS
INTRAVENOUS | Status: AC
  Filled 2019-10-21: qty 40

## 2019-10-21 MED ORDER — VASOPRESSIN 20 UNIT/ML IV SOLN
INTRAVENOUS | Status: AC
  Filled 2019-10-21: qty 1

## 2019-10-21 MED ORDER — HYDROCORTISONE NA SUCCINATE PF 100 MG IJ SOLR
INTRAMUSCULAR | Status: DC | PRN
  Administered 2019-10-21: 250 mg via INTRAVENOUS

## 2019-10-21 MED ORDER — LEVOFLOXACIN IN D5W 750 MG/150ML IV SOLN
750.0000 mg | INTRAVENOUS | Status: AC
  Administered 2019-10-22: 750 mg via INTRAVENOUS
  Filled 2019-10-21 (×2): qty 150

## 2019-10-21 MED ORDER — DEXTROSE 50 % IV SOLN
12.5000 g | INTRAVENOUS | Status: AC
  Filled 2019-10-21: qty 50

## 2019-10-21 MED ORDER — LEVOFLOXACIN IN D5W 500 MG/100ML IV SOLN
500.0000 mg | INTRAVENOUS | Status: AC
  Administered 2019-10-21: 500 mg via INTRAVENOUS
  Filled 2019-10-21: qty 100

## 2019-10-21 MED ORDER — BUPIVACAINE LIPOSOME 1.3 % IJ SUSP: INTRAMUSCULAR | Status: DC | PRN

## 2019-10-21 MED ORDER — 0.9 % SODIUM CHLORIDE (POUR BTL) OPTIME
TOPICAL | Status: DC | PRN
  Administered 2019-10-21: 3000 mL

## 2019-10-21 MED ORDER — PHENYLEPHRINE HCL-NACL 20-0.9 MG/250ML-% IV SOLN
30.0000 ug/min | INTRAVENOUS | Status: DC
  Filled 2019-10-21: qty 250

## 2019-10-21 MED ORDER — INSULIN REGULAR(HUMAN) IN NACL 100-0.9 UT/100ML-% IV SOLN: INTRAVENOUS | Status: DC

## 2019-10-21 MED ORDER — LACTATED RINGERS IV SOLN: 500.0000 mL | Freq: Once | INTRAVENOUS | Status: DC | PRN

## 2019-10-21 MED ORDER — VANCOMYCIN HCL 1000 MG IV SOLR
INTRAVENOUS | Status: AC
  Administered 2019-10-22: 1000 mL
  Filled 2019-10-21: qty 1000

## 2019-10-21 SURGICAL SUPPLY — 114 items
ADAPTER CARDIO PERF ANTE/RETRO (ADAPTER) ×3 IMPLANT
ADH SKN CLS APL DERMABOND .7 (GAUZE/BANDAGES/DRESSINGS) ×2
ADPR PRFSN 84XANTGRD RTRGD (ADAPTER) ×1
APPLIER CLIP 5 13 M/L LIGAMAX5 (MISCELLANEOUS) ×3
APR CLP MED LRG 5 ANG JAW (MISCELLANEOUS) ×2
BAG DECANTER FOR FLEXI CONT (MISCELLANEOUS) ×3 IMPLANT
BLADE CLIPPER SURG (BLADE) ×2 IMPLANT
BLADE STERNUM SYSTEM 6 (BLADE) ×1 IMPLANT
BLADE SURG 11 STRL SS (BLADE) ×3 IMPLANT
CANISTER SUCT 3000ML PPV (MISCELLANEOUS) ×5 IMPLANT
CANNULA FEM VENOUS REMOTE 22FR (CANNULA) IMPLANT
CANNULA FEMORAL ART 14 SM (MISCELLANEOUS) ×3 IMPLANT
CANNULA GUNDRY RCSP 15FR (MISCELLANEOUS) ×3 IMPLANT
CANNULA OPTISITE PERFUSION 16F (CANNULA) IMPLANT
CANNULA OPTISITE PERFUSION 18F (CANNULA) ×1 IMPLANT
CANNULA SUMP PERICARDIAL (CANNULA) ×6 IMPLANT
CATH ROBINSON RED A/P 18FR (CATHETERS) ×3 IMPLANT
CELLS DAT CNTRL 66122 CELL SVR (MISCELLANEOUS) ×4 IMPLANT
CLIP APPLIE 5 13 M/L LIGAMAX5 (MISCELLANEOUS) IMPLANT
CNTNR URN SCR LID CUP LEK RST (MISCELLANEOUS) ×2 IMPLANT
CONN ST 1/4X3/8  BEN (MISCELLANEOUS) ×6
CONN ST 1/4X3/8 BEN (MISCELLANEOUS) ×4 IMPLANT
CONNECTOR 1/2X3/8X1/2 3 WAY (MISCELLANEOUS) ×3
CONNECTOR 1/2X3/8X1/2 3WAY (MISCELLANEOUS) ×2 IMPLANT
CONT SPEC 4OZ STRL OR WHT (MISCELLANEOUS)
COVER BACK TABLE 24X17X13 BIG (DRAPES) ×3 IMPLANT
COVER PROBE W GEL 5X96 (DRAPES) ×3 IMPLANT
DERMABOND ADVANCED (GAUZE/BANDAGES/DRESSINGS) ×2
DERMABOND ADVANCED .7 DNX12 (GAUZE/BANDAGES/DRESSINGS) ×4 IMPLANT
DEVICE CLOSURE PERCLS PRGLD 6F (VASCULAR PRODUCTS) ×8 IMPLANT
DEVICE SUT CK QUICK LOAD MINI (Prosthesis & Implant Heart) ×1 IMPLANT
DEVICE TROCAR PUNCTURE CLOSURE (ENDOMECHANICALS) ×3 IMPLANT
DRAIN CHANNEL 28F RND 3/8 FF (WOUND CARE) ×6 IMPLANT
DRAPE C-ARM 42X72 X-RAY (DRAPES) ×3 IMPLANT
DRAPE CV SPLIT W-CLR ANES SCRN (DRAPES) ×3 IMPLANT
DRAPE INCISE IOBAN 66X45 STRL (DRAPES) ×6 IMPLANT
DRAPE PERI GROIN 82X75IN TIB (DRAPES) ×3 IMPLANT
DRAPE SLUSH/WARMER DISC (DRAPES) ×3 IMPLANT
DRSG AQUACEL AG ADV 3.5X 6 (GAUZE/BANDAGES/DRESSINGS) ×1 IMPLANT
DRSG AQUACEL AG ADV 3.5X10 (GAUZE/BANDAGES/DRESSINGS) ×1 IMPLANT
DRSG COVADERM 4X8 (GAUZE/BANDAGES/DRESSINGS) ×2 IMPLANT
ELECT BLADE 6.5 EXT (BLADE) ×3 IMPLANT
ELECT REM PT RETURN 9FT ADLT (ELECTROSURGICAL) ×6
ELECTRODE REM PT RTRN 9FT ADLT (ELECTROSURGICAL) ×4 IMPLANT
FELT TEFLON 1X6 (MISCELLANEOUS) ×3 IMPLANT
FEMORAL VENOUS CANN RAP (CANNULA) IMPLANT
GAUZE 4X4 16PLY RFD (DISPOSABLE) ×1 IMPLANT
GAUZE SPONGE 4X4 12PLY STRL (GAUZE/BANDAGES/DRESSINGS) ×1 IMPLANT
GAUZE SPONGE 4X4 12PLY STRL LF (GAUZE/BANDAGES/DRESSINGS) ×3 IMPLANT
GLOVE BIO SURGEON STRL SZ 6.5 (GLOVE) ×4 IMPLANT
GLOVE BIOGEL PI IND STRL 6 (GLOVE) IMPLANT
GLOVE BIOGEL PI IND STRL 6.5 (GLOVE) IMPLANT
GLOVE BIOGEL PI IND STRL 8 (GLOVE) IMPLANT
GLOVE BIOGEL PI INDICATOR 6 (GLOVE) ×1
GLOVE BIOGEL PI INDICATOR 6.5 (GLOVE) ×3
GLOVE BIOGEL PI INDICATOR 8 (GLOVE) ×1
GLOVE ORTHO TXT STRL SZ7.5 (GLOVE) ×9 IMPLANT
GOWN STRL REUS W/ TWL LRG LVL3 (GOWN DISPOSABLE) ×8 IMPLANT
GOWN STRL REUS W/TWL LRG LVL3 (GOWN DISPOSABLE) ×12
KIT BASIN OR (CUSTOM PROCEDURE TRAY) ×3 IMPLANT
KIT DILATOR VASC 18G NDL (KITS) ×3 IMPLANT
KIT DRAINAGE VACCUM ASSIST (KITS) ×1 IMPLANT
KIT SUCTION CATH 14FR (SUCTIONS) ×3 IMPLANT
KIT SUT CK MINI COMBO 4X17 (Prosthesis & Implant Heart) ×1 IMPLANT
KIT TURNOVER KIT B (KITS) ×3 IMPLANT
LEAD PACING MYOCARDI (MISCELLANEOUS) ×3 IMPLANT
LINE VENT (MISCELLANEOUS) ×1 IMPLANT
NDL AORTIC ROOT 14G 7F (CATHETERS) ×2 IMPLANT
NEEDLE AORTIC ROOT 14G 7F (CATHETERS) ×3 IMPLANT
NS IRRIG 1000ML POUR BTL (IV SOLUTION) ×15 IMPLANT
PACK E MIN INVASIVE VALVE (SUTURE) ×3 IMPLANT
PACK OPEN HEART (CUSTOM PROCEDURE TRAY) ×3 IMPLANT
PAD ARMBOARD 7.5X6 YLW CONV (MISCELLANEOUS) ×6 IMPLANT
PAD ELECT DEFIB RADIOL ZOLL (MISCELLANEOUS) ×3 IMPLANT
PERCLOSE PROGLIDE 6F (VASCULAR PRODUCTS) ×12
POSITIONER HEAD DONUT 9IN (MISCELLANEOUS) ×3 IMPLANT
RETRACTOR WND ALEXIS 18 MED (MISCELLANEOUS) IMPLANT
RING TRICUSPID T32 (Prosthesis & Implant Heart) ×1 IMPLANT
RTRCTR WOUND ALEXIS 18CM MED (MISCELLANEOUS) ×6
SET CANNULATION TOURNIQUET (MISCELLANEOUS) ×3 IMPLANT
SET CARDIOPLEGIA MPS 5001102 (MISCELLANEOUS) ×1 IMPLANT
SET IRRIG TUBING LAPAROSCOPIC (IRRIGATION / IRRIGATOR) ×3 IMPLANT
SET MICROPUNCTURE 5F STIFF (MISCELLANEOUS) ×3 IMPLANT
SHEATH PINNACLE 8F 10CM (SHEATH) ×9 IMPLANT
SOL ANTI FOG 6CC (MISCELLANEOUS) ×2 IMPLANT
SOLUTION ANTI FOG 6CC (MISCELLANEOUS) ×1
SPONGE LAP 4X18 RFD (DISPOSABLE) ×2 IMPLANT
SUT BONE WAX W31G (SUTURE) ×3 IMPLANT
SUT ETHIBOND (SUTURE) ×1 IMPLANT
SUT ETHIBOND 2 0 SH (SUTURE) ×1 IMPLANT
SUT ETHIBOND 2-0 RB-1 WHT (SUTURE) ×1 IMPLANT
SUT ETHIBOND X763 2 0 SH 1 (SUTURE) ×3 IMPLANT
SUT GORETEX CV 4 TH 22 36 (SUTURE) IMPLANT
SUT GORETEX CV-5THC-13 36IN (SUTURE) ×7 IMPLANT
SUT GORETEX CV4 TH-18 (SUTURE) IMPLANT
SUT PROLENE 3 0 SH DA (SUTURE) ×4 IMPLANT
SUT PROLENE 3 0 SH1 36 (SUTURE) ×17 IMPLANT
SUT PROLENE 4 0 RB 1 (SUTURE) ×21
SUT PROLENE 4-0 RB1 .5 CRCL 36 (SUTURE) ×4 IMPLANT
SUT SILK  1 MH (SUTURE) ×12
SUT SILK 1 MH (SUTURE) IMPLANT
SYSTEM SAHARA CHEST DRAIN ATS (WOUND CARE) ×3 IMPLANT
TAPE CLOTH SURG 4X10 WHT LF (GAUZE/BANDAGES/DRESSINGS) ×1 IMPLANT
TAPE PAPER 2X10 WHT MICROPORE (GAUZE/BANDAGES/DRESSINGS) ×1 IMPLANT
TOWEL GREEN STERILE (TOWEL DISPOSABLE) ×3 IMPLANT
TOWEL GREEN STERILE FF (TOWEL DISPOSABLE) ×3 IMPLANT
TRAY FOLEY SLVR 16FR TEMP STAT (SET/KITS/TRAYS/PACK) ×4 IMPLANT
TROCAR XCEL BLADELESS 5X75MML (TROCAR) ×3 IMPLANT
TROCAR XCEL NON-BLD 11X100MML (ENDOMECHANICALS) ×6 IMPLANT
TUBE SUCT INTRACARD DLP 20F (MISCELLANEOUS) ×4 IMPLANT
TUNNELER SHEATH ON-Q 11GX8 DSP (PAIN MANAGEMENT) IMPLANT
UNDERPAD 30X30 (UNDERPADS AND DIAPERS) ×3 IMPLANT
WATER STERILE IRR 1000ML POUR (IV SOLUTION) ×6 IMPLANT
WIRE EMERALD 3MM-J .035X150CM (WIRE) ×3 IMPLANT

## 2019-10-21 SURGICAL SUPPLY — 118 items
ADAPTER MULTI PERFUSION 15 (ADAPTER) ×1 IMPLANT
AGENT HMST 10 BLLW SHRT CANN (HEMOSTASIS) ×3
ANCHOR CATH FOLEY SECURE (MISCELLANEOUS) ×2 IMPLANT
BAG DECANTER FOR FLEXI CONT (MISCELLANEOUS) ×4 IMPLANT
BANDAGE ESMARK 6X9 LF (GAUZE/BANDAGES/DRESSINGS) IMPLANT
BASKET HEART (ORDER IN 25'S) (MISCELLANEOUS) ×2
BASKET HEART (ORDER IN 25S) (MISCELLANEOUS) ×1 IMPLANT
BLADE CLIPPER SURG (BLADE) ×1 IMPLANT
BLADE STERNUM SYSTEM 6 (BLADE) ×2 IMPLANT
BNDG CMPR 9X6 STRL LF SNTH (GAUZE/BANDAGES/DRESSINGS) ×1
BNDG ELASTIC 4X5.8 VLCR STR LF (GAUZE/BANDAGES/DRESSINGS) ×2 IMPLANT
BNDG ELASTIC 6X5.8 VLCR STR LF (GAUZE/BANDAGES/DRESSINGS) ×2 IMPLANT
BNDG ESMARK 6X9 LF (GAUZE/BANDAGES/DRESSINGS) ×2
BNDG GAUZE ELAST 4 BULKY (GAUZE/BANDAGES/DRESSINGS) ×1 IMPLANT
CANISTER SUCT 3000ML PPV (MISCELLANEOUS) ×2 IMPLANT
CANNULA AORTIC ROOT 9FR (CANNULA) ×1 IMPLANT
CANNULA EZ GLIDE AORTIC 21FR (CANNULA) ×5 IMPLANT
CANNULA MC2 2 STG 36/46 NON-V (CANNULA) IMPLANT
CANNULA VENOUS 2 STG 34/46 (CANNULA) ×2
CATH CPB KIT OWEN (MISCELLANEOUS) ×1 IMPLANT
CATH THORACIC 36FR (CATHETERS) ×2 IMPLANT
CLIP RETRACTION 3.0MM CORONARY (MISCELLANEOUS) ×2 IMPLANT
CLIP VESOCCLUDE LG 6/CT (CLIP) ×1 IMPLANT
CLIP VESOCCLUDE MED 24/CT (CLIP) IMPLANT
CLIP VESOCCLUDE SM WIDE 24/CT (CLIP) IMPLANT
CONN 3/8X3/8 GISH STERILE (MISCELLANEOUS) ×2 IMPLANT
DRAIN CHANNEL 32F RND 10.7 FF (WOUND CARE) ×4 IMPLANT
DRAPE CARDIOVASCULAR INCISE (DRAPES) ×2
DRAPE INCISE IOBAN 66X45 STRL (DRAPES) ×3 IMPLANT
DRAPE SLUSH/WARMER DISC (DRAPES) ×2 IMPLANT
DRAPE SRG 135X102X78XABS (DRAPES) ×1 IMPLANT
DRSG AQUACEL AG ADV 3.5X14 (GAUZE/BANDAGES/DRESSINGS) ×2 IMPLANT
ELECT BLADE 4.0 EZ CLEAN MEGAD (MISCELLANEOUS) ×2
ELECT REM PT RETURN 9FT ADLT (ELECTROSURGICAL) ×4
ELECTRODE BLDE 4.0 EZ CLN MEGD (MISCELLANEOUS) ×1 IMPLANT
ELECTRODE REM PT RTRN 9FT ADLT (ELECTROSURGICAL) ×2 IMPLANT
FELT TEFLON 1X6 (MISCELLANEOUS) ×4 IMPLANT
GAUZE SPONGE 4X4 12PLY STRL (GAUZE/BANDAGES/DRESSINGS) ×4 IMPLANT
GLOVE BIO SURGEON STRL SZ 6.5 (GLOVE) ×4 IMPLANT
GLOVE NEODERM STRL 7.5  LF PF (GLOVE) ×1
GLOVE NEODERM STRL 7.5 LF PF (GLOVE) IMPLANT
GLOVE ORTHO TXT STRL SZ7.5 (GLOVE) ×5 IMPLANT
GLOVE SURG NEODERM 7.5  LF PF (GLOVE) ×1
GOWN STRL REUS W/ TWL LRG LVL3 (GOWN DISPOSABLE) ×4 IMPLANT
GOWN STRL REUS W/TWL LRG LVL3 (GOWN DISPOSABLE) ×16
HEMOSTAT HEMOBLAST BELLOWS (HEMOSTASIS) ×3 IMPLANT
HEMOSTAT POWDER SURGIFOAM 1G (HEMOSTASIS) ×8 IMPLANT
INSERT FOGARTY XLG (MISCELLANEOUS) ×2 IMPLANT
KIT BASIN OR (CUSTOM PROCEDURE TRAY) ×2 IMPLANT
KIT SUCTION CATH 14FR (SUCTIONS) ×10 IMPLANT
KIT TURNOVER KIT B (KITS) ×2 IMPLANT
KIT VASOVIEW HEMOPRO 2 VH 4000 (KITS) ×1 IMPLANT
LEAD PACING MYOCARDI (MISCELLANEOUS) ×3 IMPLANT
MARKER GRAFT CORONARY BYPASS (MISCELLANEOUS) ×3 IMPLANT
NS IRRIG 1000ML POUR BTL (IV SOLUTION) ×10 IMPLANT
PACK E OPEN HEART (SUTURE) ×2 IMPLANT
PACK OPEN HEART (CUSTOM PROCEDURE TRAY) ×2 IMPLANT
PAD ARMBOARD 7.5X6 YLW CONV (MISCELLANEOUS) ×4 IMPLANT
PAD ELECT DEFIB RADIOL ZOLL (MISCELLANEOUS) ×2 IMPLANT
PENCIL BUTTON HOLSTER BLD 10FT (ELECTRODE) ×2 IMPLANT
POSITIONER HEAD DONUT 9IN (MISCELLANEOUS) ×2 IMPLANT
POWDER SURGICEL 3.0 GRAM (HEMOSTASIS) ×1 IMPLANT
PUNCH AORTIC ROTATE 4.0MM (MISCELLANEOUS) IMPLANT
PUNCH AORTIC ROTATE 4.5MM 8IN (MISCELLANEOUS) IMPLANT
PUNCH AORTIC ROTATE 5MM 8IN (MISCELLANEOUS) IMPLANT
SET IRRIG TUBING LAPAROSCOPIC (IRRIGATION / IRRIGATOR) ×1 IMPLANT
SOL ANTI FOG 6CC (MISCELLANEOUS) IMPLANT
SOLUTION ANTI FOG 6CC (MISCELLANEOUS)
SPONGE ABD ABTHERA ADVANCE (MISCELLANEOUS) ×1 IMPLANT
SPONGE LAP 18X18 RF (DISPOSABLE) ×9 IMPLANT
SPONGE LAP 4X18 RFD (DISPOSABLE) ×2 IMPLANT
SUT BONE WAX W31G (SUTURE) ×2 IMPLANT
SUT ETHIBOND 2 0 SH (SUTURE) ×8
SUT ETHIBOND 2 0 SH 36X2 (SUTURE) IMPLANT
SUT ETHIBOND X763 2 0 SH 1 (SUTURE) ×6 IMPLANT
SUT MNCRL AB 3-0 PS2 18 (SUTURE) ×4 IMPLANT
SUT MNCRL AB 4-0 PS2 18 (SUTURE) IMPLANT
SUT PDS AB 1 CTX 36 (SUTURE) ×4 IMPLANT
SUT PROLENE 2 0 SH DA (SUTURE) ×4 IMPLANT
SUT PROLENE 3 0 SH DA (SUTURE) ×12 IMPLANT
SUT PROLENE 3 0 SH1 36 (SUTURE) ×15 IMPLANT
SUT PROLENE 4 0 RB 1 (SUTURE) ×8
SUT PROLENE 4 0 SH DA (SUTURE) ×5 IMPLANT
SUT PROLENE 4-0 RB1 .5 CRCL 36 (SUTURE) IMPLANT
SUT PROLENE 5 0 C 1 36 (SUTURE) IMPLANT
SUT PROLENE 6 0 C 1 30 (SUTURE) ×7 IMPLANT
SUT PROLENE 7.0 RB 3 (SUTURE) ×6 IMPLANT
SUT PROLENE 8 0 BV175 6 (SUTURE) IMPLANT
SUT PROLENE BLUE 7 0 (SUTURE) ×2 IMPLANT
SUT PROLENE POLY MONO (SUTURE) IMPLANT
SUT SILK  1 MH (SUTURE) ×26
SUT SILK 1 MH (SUTURE) ×1 IMPLANT
SUT SILK 1 TIES 10X30 (SUTURE) ×1 IMPLANT
SUT SILK 2 0 SH CR/8 (SUTURE) ×1 IMPLANT
SUT STEEL 6MS V (SUTURE) IMPLANT
SUT STEEL STERNAL CCS#1 18IN (SUTURE) IMPLANT
SUT STEEL SZ 6 DBL 3X14 BALL (SUTURE) IMPLANT
SUT TEM PAC WIRE 2 0 SH (SUTURE) ×2 IMPLANT
SUT VIC AB 1 CTX 36 (SUTURE)
SUT VIC AB 1 CTX36XBRD ANBCTR (SUTURE) IMPLANT
SUT VIC AB 2-0 CT1 27 (SUTURE)
SUT VIC AB 2-0 CT1 TAPERPNT 27 (SUTURE) IMPLANT
SUT VIC AB 2-0 CTX 27 (SUTURE) ×2 IMPLANT
SUT VIC AB 2-0 UR6 27 (SUTURE) ×2 IMPLANT
SUT VIC AB 3-0 SH 27 (SUTURE)
SUT VIC AB 3-0 SH 27X BRD (SUTURE) IMPLANT
SUT VIC AB 3-0 X1 27 (SUTURE) IMPLANT
SUT VICRYL 4-0 PS2 18IN ABS (SUTURE) IMPLANT
SYR 50ML LL SCALE MARK (SYRINGE) ×1 IMPLANT
SYSTEM SAHARA CHEST DRAIN ATS (WOUND CARE) ×3 IMPLANT
TOWEL GREEN STERILE (TOWEL DISPOSABLE) ×2 IMPLANT
TOWEL GREEN STERILE FF (TOWEL DISPOSABLE) ×2 IMPLANT
TRAY FOLEY SLVR 16FR TEMP STAT (SET/KITS/TRAYS/PACK) ×1 IMPLANT
TUBING LAP HI FLOW INSUFFLATIO (TUBING) ×2 IMPLANT
TUBING MEDICAL 3X8X3X32 (MISCELLANEOUS) ×1 IMPLANT
UNDERPAD 30X30 (UNDERPADS AND DIAPERS) ×1 IMPLANT
WATER STERILE IRR 1000ML POUR (IV SOLUTION) ×4 IMPLANT
YANKAUER SUCT BULB TIP NO VENT (SUCTIONS) ×2 IMPLANT

## 2019-10-21 NOTE — Op Note (Addendum)
CARDIOTHORACIC SURGERY OPERATIVE NOTE  Date of Procedure:  11/04/2019  Preoperative Diagnosis: Severe Tricuspid Regurgitation  Postoperative Diagnosis: Same  Procedure:    Minimally-Invasive Tricuspid Valve Repair  Complex valvuloplasty including autologous pericardial patch augmentation  Suture plication of commissures  [employer] Annuloplasty (size 72mm, model # N8169330, serial # [id number])    Surgeon: Valentina Gu. 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  Type I and type IIIA valve dysfunction with severe tricuspid regurgitation  Normal left ventricular systolic function  Mild-moderate right ventricular chamber enlargement with mild RV dysfunction  Mild-moderate (1+/2+) residual tricuspid regurgitation after valve repair  Severe thrombocytopenia and coagulapathy after reversal of heparin              BRIEF CLINICAL NOTE AND INDICATIONS FOR SURGERY  Patient is a 49 year old African-American female with longstanding history of lupus, hypertension, lupus nephritis, 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  Patient has a long history of lupus dating back more than 20 years. The patient was on prednisone for many years but eventually successfully weaned off after she had problems with avascular necrosis involving both hips and both shoulders as well as weight gain and type 2 diabetes mellitus. For the most part she has been off of steroids for the past 2 and half to 3 years although she required a brief steroid taper on 1 occasion last fall. She is currently immunosuppressed using Plaquenil and cyclophosphamide. She was  first diagnosed with congestive heart failure and fluid retention approximately 1 and half years ago. She was referred to [name] in the advanced heart failure clinic who has been following her ever since. Transthoracic echocardiogram performed December 2019 revealed normal left ventricular size and systolic function with mild mitral regurgitation, severe right atrial enlargement, severe tricuspid regurgitation, and peak pulmonary artery pressures estimated 68 mmHg. Right and left heart cath was performed demonstrating normal coronary arteries with mild pulmonary hypertension and normal PVR. Follow-up echocardiogram February 2020 revealed normal left ventricular systolic function with mild right ventricular chamber enlargement. The septum appears flat and there remains severe tricuspid regurgitation. RV systolic pressures were estimated 86 mmHg. A bubble study was negative. Right heart catheterization was repeated June 2020 but there remained only mild pulmonary hypertension. High resolution chestCT and V/Q studies were performed and notable for the absence of interstitial lung disease pulmonary embolism but findings were consistent with pulmonary hypertension. Cardiac MRI revealed moderate right ventricular enlargement with right ventricular ejection fraction decreased to 40%. There was normal left ventricular size and function. There was severe tricuspid regurgitation very mild mitral regurgitation. There is no sign of atrial septal defect, patent foramen ovale, nor ventricular septal defect.Patient recently underwent transesophageal echocardiogram which revealed normal left ventricular size and systolic function. There was mildly reduced right ventricular systolic function and the right ventricle did not appear severely dilated. There was moderate left atrial enlargement with severe right atrial enlargement. There was trivial mitral regurgitation. There was severe tricuspid  regurgitation. The aortic valve appeared normal. There was severely elevated pulmonary artery systolic pressure. Cardiothoracic surgical consultation was requested.  The patient has been seen in consultation and counseled at length regarding the indications, risks and potential benefits of surgery.  All questions have been answered, and the patient provides full  informed consent for the operation as described.    DETAILS OF THE OPERATIVE PROCEDURE  Preparation:  The patient is brought to the operating room on the above mentioned date and central monitoring was established by the anesthesia team including placement of Swan-Ganz catheter through the left internal jugular vein.  A radial arterial line is placed. The patient is placed in the supine position on the operating table.  Intravenous antibiotics are administered. General endotracheal anesthesia is induced uneventfully. The patient is initially intubated using a dual lumen endotracheal tube.  A Foley catheter is placed.  Baseline transesophageal echocardiogram was performed.  Findings were notable for severe tricuspid regurgitation.  The jet of regurgitation was brought in for of the right atrium which was very large.  There was severe annular dilatation and the tricuspid annulus measured greater than 5.0 cm.  There was mild to moderate right ventricular chamber enlargement with mild right ventricular systolic dysfunction.  There was normal left ventricular systolic function with mild left ventricular hypertrophy.  There was some septal flattening due to right ventricular overload.  The aortic valve was normal.  There was trace to mild mitral regurgitation.  A soft roll is placed behind the patient's left scapula and the neck gently extended and turned to the left.   The patient's right neck, chest, abdomen, both groins, and both lower extremities are prepared and draped in a sterile manner. A time out procedure is performed.   Percutaneous  Vascular Access:  Percutaneous arterial and venous access were obtained on the right side.  Using ultrasound guidance the right common femoral vein was cannulated using the Seldinger technique a pair of Perclose vascular closure devises were placed at opposing 30 degree angles in the femoral vein, after which time an 8 French sheath inserted.  The right common femoral artery was cannulated using a micropuncture wire and sheath.  A pair of Perclose vascular closure devices were placed at opposing 30 degree angles in the femoral artery, and a 8 French sheath inserted.  The right internal jugular vein was cannulated  using ultrasound guidance and an 8 French sheath inserted.     Surgical Approach:  A right miniature anterolateral thoracotomy incision is performed. The incision is placed just lateral to and superior to the right nipple. The pectoralis major muscle is retracted medially and completely preserved. The right pleural space is entered through the 3rd intercostal space.  A soft tissue retractor is placed.  Adhesions between the right middle lobe and right lower lobe and the mediastinum are divided with electrocautery and sharp dissection.  Two 11 mm ports are placed through separate stab incisions inferiorly. The right pleural space is insufflated continuously with carbon dioxide gas through the posterior port during the remainder of the operation.  A pledgeted sutures placed through the dome of the right hemidiaphragm and retracted inferiorly to facilitate exposure.  A longitudinal incision is made in the pericardium 3 cm anterior to the phrenic nerve and silk traction sutures are placed on either side of the incision for exposure.   Extracorporeal Cardiopulmonary Bypass and Myocardial Protection:   The patient was heparinized systemically.  The right common femoral vein is cannulated through the venous sheath and a guidewire advanced into the right atrium using TEE guidance.  The femoral vein  cannulated using a 22 Fr long femoral venous cannula.  The right common femoral artery is cannulated through the arterial sheath and a guidewire advanced into the descending thoracic aorta using TEE guidance.  Femoral artery  is cannulated with a 18 French femoral arterial cannula.  The right internal jugular vein is cannulated through the venous sheath and a guidewire advanced into the right atrium.  The internal jugular vein is cannulated using a 14 French pediatric femoral venous cannula.   Adequate heparinization is verified.   The entire pre-bypass portion of the operation was notable for stable hemodynamics.  Cardiopulmonary bypass was begun.  Vacuum assist venous drainage is utilized. The incision in the pericardium is extended in both directions. Venous drainage and exposure are notably excellent.  A portion of the patient's pericardium is excised and subsequently tanned and 0.625% glutaraldehyde solution for a total of 3 minutes, after which time the patch is rinsed in consecutive baths of saline.  Umbilical tapes were placed around the superior vena cava and the inferior vena cava.  An antegrade cardioplegia cannula is placed in the ascending aorta.    The patient is cooled to 32C systemic temperature.  The aortic cross clamp is applied and cardioplegia is delivered initially in an antegrade fashion through the aortic root using modified [place] cold blood cardioplegia ([facility] blood cardioplegia protocol).   The initial cardioplegic arrest is rapid with early diastolic arrest.   Myocardial protection was felt to be excellent.   Tricuspid Valve Repair:  An oblique right atriotomy incision was performed through the interatrial groove and extended partially across the back wall of the left atrium after opening the oblique sinus inferiorly.  The inferior vena cava cannula is pulled down until the distal tip is just outside the right atrium and all umbilical tape secured.  3-0 Prolene traction  sutures are placed to facilitate exposure of the tricuspid valve.  The tricuspid valve was inspected.  The tricuspid annulus is extremely dilated.  The free margin of the anterior and posterior leaflets are fibrotic and somewhat scarred with a nodular pattern suggestive of Libman-Sacks endocarditis.  The septal leaflet is thin and very delicate and mobile.  There are large gaps at the commissures between the leaflets.  Interrupted 2-0 Ethibond horizontal mattress sutures are placed circumferentially around the tricuspid valve annulus with exception of small area immediately adjacent to the triangle of Koch.  The sutures are utilized to suspend the valve symmetrically and will ultimately be utilized for ring annuloplasty.  With saline testing there is no valve competence.  All leaflets appear to move reasonably well but there is a inadequate leaflet tissue to facilitate coaptation.  A decision is made to proceed with patch augmentation to increase the surface area of coaptation and facilitate repair.  The patient's autologous pericardial patch is trimmed to an elliptical shape.  The anterior and posterior leaflets of the tricuspid valve are mobilized off of the tricuspid annulus from commissure to commissure with 11 blade knife.  At the junction between the anterior and posterior leaflets the free margin is preserved and continuity.  The combination of the anterior and posterior leaflets are subsequently augmented with a generous sized elliptical patch of autologous pericardium.  The pericardium is sewn in place with a 2 layer closure of running 4-0 Prolene suture.  At this juncture the valve is tested with saline and the large patch facilitates coaptation.  There remain gaps at the commissures between the septal and anterior leaflet as well as between the septal and posterior leaflet.  The gap between the septal and posterior leaflet is closed with several everting CV 5 Gore-Tex sutures.  The valve is sized  to accept a 32 mm annuloplasty ring  which downsize is the annulus dramatically and corresponds to the overall size and shape of the newly augmented combination of anterior and posterior leaflets.  An [facility] 3 annuloplasty ring (size 71mm, catalog #[id number], serial [id number]) was secured in place uneventfully. All ring sutures were secured using a Cor-knot device.    The valve was tested with saline and appeared reasonably competent.  Rewarming is begun.  One final dose of warm "reanimation dose" cardioplegia was administered through the aortic root.  The aortic cross clamp was removed after a total cross clamp time of 102 minutes.  The right atriotomy was closed using a 2-layer closure of running 3-0 Prolene suture.    The patient's heart begins to be spontaneously without need for cardioversion.  Prior to proceeding any further the patient is weaned and separated from cardiopulmonary bypass and transesophageal echocardiogram performed to evaluate integrity of the tricuspid valve repair.  At this juncture there remains at least moderate residual tricuspid regurgitation.  The jet of regurgitation was eccentric and appeared to be located primarily at the commissure between the anterior and septal leaflets adjacent to the right ventricular outflow tract.  A decision is made to proceed back to reevaluate the repair.  The aortic cross-clamp was replaced and another dose of cold blood cardioplegia administered antegrade through the aortic root.  The right atriotomy incision is reopened and traction sutures replaced.  The valve repair is inspected carefully.  The commissure between the septal and the anterior leaflet is subsequently closed using several everting CV 5 Gore-Tex suture.  Valve repair appears considerably improved with saline testing.  Another warm reanimation dose of blood cardioplegia is administered through the aortic root and the aortic cross-clamp removed after a second cross-clamp time of 35 minutes  such that the grand total cross-clamp time for the entire procedure was 137 minutes.  The right atriotomy incision was closed with a 2 layer closure of running 3-0 Prolene suture.   Procedure Completion:  Epicardial pacing wires are fixed to the inferior wall of the right ventricule and to the right atrial appendage. The patient is rewarmed to 37C temperature. The antegrade cardioplegia cannula is removed. The patient is weaned and disconnected from cardiopulmonary bypass.  The patient's rhythm at separation from bypass was sinus.  The patient was weaned from bypass without any inotropic support. Total cardiopulmonary bypass time for the operation was 215 minutes.  Followup transesophageal echocardiogram performed after separation from bypass revealed a well-seated annuloplasty ring in the tricuspid position. There was mild to moderate (1+/2+) residual leak.  Left ventricular function was unchanged from preoperatively.  The mean gradient across the tricuspid valve was estimated to be 3 mmHg.  The femoral arterial and venous cannulas were removed and all Perclose sutures secured.  Manual pressure was maintained while Protamine was administered.  The right internal jugular cannula was removed and manual pressure held on the neck and groin for 15 minutes.  Single lung ventilation was begun. The atriotomy closure was inspected for hemostasis.  There was severe coagulopathy which was verified using thromboelastography.  The pericardial sac was drained using a 28 French Bard drain placed through the anterior port incision.  The right pleural space is irrigated with saline solution and inspected for hemostasis.  There was severe thrombocytopenia with platelet count 38,000.  The patient was transfused 2 packs platelets and 2 units fresh frozen plasma.  Coagulopathy improved.     A mixture of Exparel liposomal bupivacaine (20 mL) and 0.5% bupivacaine (30 mL) is  utilized to create an intercostal nerve block for  postoperative analgesia.  The mixture is injected under direct vision into the intercostal neurovascular bundles posteriorly to cover the second through the sixth intercostal nerve roots.  Portions of the solution are also injected into the intercostal neurovascular bundles immediately surrounding the surgical incision and immediately adjacent to the chest tube exit sites.  The right pleural space was drained using a 28 French Bard drain placed through the posterior port incision. The miniature thoracotomy incision was closed in multiple layers in routine fashion.   The post-bypass portion of the operation was notable for stable rhythm and hemodynamics.  The patient received 6 units packed red blood cells during the procedure due to anemia which was present preoperatively and exacerbated by acute blood loss and hemodilution during cardiopulmonary bypass.    Disposition:  The patient tolerated the procedure well.  The patient was reintubated using a single lumen endotracheal tube and subsequently transported to the surgical intensive care unit in stable condition. There were no intraoperative complications. All sponge instrument and needle counts are verified correct at completion of the operation.     Valentina Gu. Roxy Manns MD 11/01/2019 4:18 PM

## 2019-10-21 NOTE — Progress Notes (Addendum)
[facility] BRIEF SICU PROGRESS NOTE  Day of Surgery  S/P Procedure(s) (LRB): MINIMALLY INVASIVE TRICUSPID VALVE REPAIR USING MC3 TRICUSPID ANNULOPLASTY RING (Right) TRANSESOPHAGEAL ECHOCARDIOGRAM (TEE) (N/A)   Patient has started to wake and remained hemodynamically stable in sinus rhythm However, patient has had persistently elevated chest tube output with total > 600 mL out since return from OR Platelet count remains low 77k and INR slightly prolonged Repeat pCXR with mild increased opacity right chest c/w developing hemothorax  Plan: Transfuse platelets and return to OR for reexploration right chest due to bleeding.  Discussed w/ patient's husband at bedside.  All questions answered.  Rexene Alberts, MD 11/08/2019 7:27 PM

## 2019-10-21 NOTE — Anesthesia Procedure Notes (Signed)
Procedure Name: Intubation Date/Time: 10/31/2019 4:28 PM Performed by: Inda Coke, CRNA Pre-anesthesia Checklist: Patient identified, Emergency Drugs available, Suction available and Patient being monitored Patient Re-evaluated:Patient Re-evaluated prior to induction Oxygen Delivery Method: Circle System Utilized Preoxygenation: Pre-oxygenation with 100% oxygen Induction Type: IV induction Laryngoscope Size: Glidescope and 3 Grade View: Grade I Tube type: Oral Tube size: 8.0 mm Number of attempts: 1 Airway Equipment and Method: Video-laryngoscopy and Bougie stylet Placement Confirmation: ETT inserted through vocal cords under direct vision,  positive ETCO2 and breath sounds checked- equal and bilateral Secured at: 22 cm Tube secured with: Tape Dental Injury: Teeth and Oropharynx as per pre-operative assessment  Comments: Double lumen ETT removed with glidescope grade I view  and 8.0 ETT smoothly exchanged over cook catheter. BBS present and +etco2.

## 2019-10-21 NOTE — Anesthesia Procedure Notes (Signed)
Central Venous Catheter Insertion Performed by: Albertha Ghee, MD, anesthesiologist Start/End04/25/2021 7:58 AM, 10/17/2019 8:08 AM Patient location: Pre-op. Preanesthetic checklist: patient identified, IV checked, site marked, risks and benefits discussed, surgical consent, monitors and equipment checked, pre-op evaluation, timeout performed and anesthesia consent Position: Trendelenburg Lidocaine 1% used for infiltration and patient sedated Hand hygiene performed  and maximum sterile barriers used  Catheter size: 8.5 Fr Sheath introducer Procedure performed using ultrasound guided technique. Ultrasound Notes:anatomy identified, needle tip was noted to be adjacent to the nerve/plexus identified, no ultrasound evidence of intravascular and/or intraneural injection and image(s) printed for medical record Attempts: 1 Following insertion, line sutured, dressing applied and Biopatch. Post procedure assessment: blood return through all ports, free fluid flow and no air  Patient tolerated the procedure well with no immediate complications.

## 2019-10-21 NOTE — Progress Notes (Signed)
Late Entry  Patient arrived to [facility] from the [facility] at 5 with a cooler from blood bank. Verbal orders from Dr. Roxy Manns to emergently transfuse 1 unit cyro and 2 units FFP. The above units were transfused rapidly without issue. Vitals remained stable.  Labs were collected per protocol. Patient severely hypoglycemic (CBG 20) and received D50 amp. Dr. Roxy Manns notified of all results. Patient with a flow track - CI <2, SV 30-40.  Patient with large amounts of chest tube output throughout this. Dr. Roxy Manns at bedside and closely monitoring the amount of output. Patient given 2 units PRBC emergently and also given 2 units platelets emergently. Patient also received a total of 3 albumin, including 1 sent from the OR. Patient with a total of 800 mls bloody output from PTs. Dr. Roxy Manns stated he will take patient back to the OR. Patient's husband at bedside and aware of everything happening.

## 2019-10-21 NOTE — Brief Op Note (Signed)
11/02/2019  2:22 PM  PATIENT:  [name]  49 y.o. female  PRE-OPERATIVE DIAGNOSIS:  Tricuspid Valve Insufficiency  POST-OPERATIVE DIAGNOSIS:   Tricuspid Valve Insufficiency  PROCEDURE:  Procedure(s): MINIMALLY INVASIVE TRICUSPID VALVE REPAIR USING MC3 TRICUSPID ANNULOPLASTY RING (Right) and anterior pericardial patch leafletplasty  TRANSESOPHAGEAL ECHOCARDIOGRAM (TEE) (N/A)  SURGEON: Rexene Alberts, MD  PHYSICIAN ASSISTANT: Jessica Seidman  ANESTHESIA:   general  EBL:  Per perfusion and anesthesia records   BLOOD ADMINISTERED:Plt's x 2 phereses, PRBC's  DRAINS: left pleural and mediastinal tubes  LOCAL MEDICATIONS USED:  NONE  SPECIMEN:  No Specimen  DISPOSITION OF SPECIMEN:  N/A  COUNTS:  YES  DICTATION: .Dragon Dictation  PLAN OF CARE: Admit to inpatient   PATIENT DISPOSITION:  ICU - intubated and hemodynamically stable.   Delay start of Pharmacological VTE agent (>24hrs) due to surgical blood loss or risk of bleeding: yes

## 2019-10-21 NOTE — Brief Op Note (Signed)
10/31/2019  4:24 PM  PATIENT:  [name]  49 y.o. female  PRE-OPERATIVE DIAGNOSIS:  TR  POST-OPERATIVE DIAGNOSIS:  TR  PROCEDURE:  Procedure(s): MINIMALLY INVASIVE TRICUSPID VALVE REPAIR USING MC3 TRICUSPID ANNULOPLASTY RING (Right) TRANSESOPHAGEAL ECHOCARDIOGRAM (TEE) (N/A)  SURGEON:  Surgeon(s) and Role:    Rexene Alberts, MD - Primary  PHYSICIAN ASSISTANT:   ASSISTANTS: Enid Cutter, PA-C  ANESTHESIA:   general  EBL:  3100 mL   BLOOD ADMINISTERED:6 CC PRBC  DRAINS: 2 Chest Tube(s) in the right pleural space and mediastinum   LOCAL MEDICATIONS USED:  BUPIVICAINE   SPECIMEN:  No Specimen  DISPOSITION OF SPECIMEN:  N/A  COUNTS:  YES  TOURNIQUET:  * No tourniquets in log *  DICTATION: .Note written in EPIC  PLAN OF CARE: Admit to inpatient   PATIENT DISPOSITION:  ICU - intubated and hemodynamically stable.   Delay start of Pharmacological VTE agent (>24hrs) due to surgical blood loss or risk of bleeding: yes

## 2019-10-21 NOTE — Progress Notes (Signed)
Echocardiogram Echocardiogram Transesophageal has been performed.  Oneal Deputy Najee Manninen 11/02/2019, 9:42 AM

## 2019-10-21 NOTE — Interval H&P Note (Signed)
History and Physical Interval Note:  11/11/2019 6:52 AM  [name]  has presented today for surgery, with the diagnosis of TR.  The various methods of treatment have been discussed with the patient and family. After consideration of risks, benefits and other options for treatment, the patient has consented to  Procedure(s): MINIMALLY INVASIVE TRICUSPID VALVE REPAIR OR REPLACEMENT (Right) TRANSESOPHAGEAL ECHOCARDIOGRAM (TEE) (N/A) as a surgical intervention.  The patient's history has been reviewed, patient examined, no change in status, stable for surgery.  I have reviewed the patient's chart and labs.  Questions were answered to the patient's satisfaction.     Rexene Alberts

## 2019-10-21 NOTE — Transfer of Care (Signed)
Immediate Anesthesia Transfer of Care Note  Patient: [name]  Procedure(s) Performed: MINIMALLY INVASIVE TRICUSPID VALVE REPAIR USING MC3 TRICUSPID ANNULOPLASTY RING (Right Chest) TRANSESOPHAGEAL ECHOCARDIOGRAM (TEE) (N/A )  Patient Location: SICU  Anesthesia Type:General  Level of Consciousness: Patient remains intubated per anesthesia plan  Airway & Oxygen Therapy: Patient remains intubated per anesthesia plan and Patient placed on Ventilator (see vital sign flow sheet for setting)  Post-op Assessment: Report given to RN and Post -op Vital signs reviewed and stable  Post vital signs: Reviewed and stable  Last Vitals:  Vitals Value Taken Time  BP 100/74 11/05/2019 1648  Temp    Pulse 79 11/08/2019 1658  Resp 14 10/18/2019 1658  SpO2 100 % 10/16/2019 1658  Vitals shown include unvalidated device data.  Last Pain:  Vitals:   10/19/2019 0700  TempSrc: Oral         Complications: No apparent anesthesia complications

## 2019-10-21 NOTE — Hospital Course (Addendum)
Patient underwent tricuspid valve repair via right mini thoracotomy approach on 10/25/2019.  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  Patient developed severe thrombocytopenia and coagulopathy treated with transfusion of platelets, FFP and cryoprecipitate. She underwent re exploration of right chest for bleeding. She had sudden VF arrest at the beginning of this procedure and severe coagulopathy refractory to massive resuscitation with blood products. Ultimately, she was placed on ECMO for acute hypoxic respiratory failure and acute on chronic RV failure.  Patient was resuscitated in the OR throughout the early morning of 11/08/2019 and remained severely coagulopathic.  Eventually her mediastinum was packed and covered with Esmark dressing and the patient brought to the ICU for further resuscitation.  Over the next 6-7 hours the patient's bleeding persisted despite aggressive resuscitation with blood products and all other pharmacologic means possible to correct her coagulopathy. The patient was taken back to the operating room on the afternoon of 10/30/2019 for reexploration for bleeding and further resuscitation.  The patient's coagulopathy appeared to improve and the patient was returned to the ICU on ECMO support in stable but critical condition.  On 10/29/2019 patient was taken back to the operating room for mediastinal wash-out and re-exploration of right chest for evacuation of massive right hemothorax noted on morning CXR.  Intraoperative TEE revealed improved cardiac function while ECMO flows were temporarily weaned with low normal LV function and improved RV function with intact tricuspid valve repair.  On October 24, 2019 the patient began to develop seizure activity for which she was evaluated by the neurology team.  CT scan of the head revealed no acute abnormality.  Seizures  were treated with intravenous Keppra, high-dose midazolam, and propofol infusions.  The patient was diuresed aggressively for more than 48 hours.  On October 26, 2019 the patient was taken back to the operating room for mediastinal washout, separation from ECMO support, ECMO decannulation, and delayed primary closure of the sternum.  Patient developed rapid atrial fibrillation for which she was treated with intravenous amiodarone.  Recurrente seizure activity was noted on 10/27/2019 and phenobarbital was started by the neurology team.  Patient developed acute oliguric renal failure and was started on CRRT on 10/28/2019.  Over the following week the patient remained clinically stable but experienced no improvement in neurologic function.  Respiratory and hemodynamic status remained stable despite weaning from nitric oxide and inotropic support.  CRRT was well tolerated and volume excess corrected with removal of more than 17 liters fluid and return of weight towards preop baseline.  Despite holding all forms of sedation the patient remained comatose on the ventilator.  She was followed carefully by the Neurology team who felt the patient had suffered "significant irreversible neurologic damage and will not likely have meaningful neurologic recovery."  After extensive counseling with the Critical Care, Neurology and Palliative Care Teams the patient was made DNR at the family's request on 11/02/2019.  Multiple conversations with the family were organized by the Palliative Care Team.  CRRT was stopped and comfort care measures established on 11/08/2019.  Following consultation with the patient's family the patient was extubated on 2019/12/09 and passed away peacefully.

## 2019-10-21 NOTE — Anesthesia Procedure Notes (Addendum)
Procedure Name: Intubation Date/Time: 10/29/2019 9:04 AM Performed by: Inda Coke, CRNA Pre-anesthesia Checklist: Patient identified, Emergency Drugs available, Suction available and Patient being monitored Patient Re-evaluated:Patient Re-evaluated prior to induction Oxygen Delivery Method: Circle System Utilized Preoxygenation: Pre-oxygenation with 100% oxygen Induction Type: IV induction Ventilation: Mask ventilation without difficulty Endobronchial tube: Double lumen EBT, EBT position confirmed by auscultation and EBT position confirmed by fiberoptic bronchoscope and 37 Fr Number of attempts: 1 Airway Equipment and Method: Stylet Placement Confirmation: ETT inserted through vocal cords under direct vision,  positive ETCO2 and breath sounds checked- equal and bilateral Secured at: 31 cm Tube secured with: Tape Dental Injury: Teeth and Oropharynx as per pre-operative assessment

## 2019-10-21 NOTE — Consult Note (Signed)
Advanced Heart Failure Team Consult Note   Primary Physician: [name], MD PCP-Cardiologist:  No primary care provider on file.  Reason for Consultation: Severe TR and post-op shock.   HPI:    [name] is seen today for evaluation of severe tricuspid regurgitation and post-op shock at the request of Dr. 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   I have followed her since 2019. Initial echo showed severe TR with RV strain and PAH pressures estimated ~70. However when RHC performed PA pressures not markedly elevated.  She has had extensive w/u which revealed severe TR as likely the primary problem. Cath with normal coronaries.   I referred her to Dr. Roxy Manns to consider TVR. After a long discussion, we agreed that attempt at TR repair would be her best long-term plan to avoid permanent RV failure.   She underwent successful mini-TV repair today which has been complicated by severe post-op bleeding in setting of known SLE.  Received multiple units of blood products by Dr. Roxy Manns post-operatively but has > 800cc output from CTs.   I d/w Dr. Roxy Manns and saw the patient in CCU prior to going back to OR. She was intubated and sedated on multiple pressors with ongoing CT bleeding.     Review of Systems: unavailable due to intubated  Home Medications Prior to Admission medications   Medication Sig Start Date End Date Taking? Authorizing Provider  furosemide (LASIX) 40 MG tablet Take 1 tablet (40 mg total) by mouth 3 (three) times a week. Every Monday, Wednesday and Friday Patient taking differently: Take 40 mg by mouth every Monday, Wednesday, and Friday.  04/15/19 04/14/20 Yes Dalissa Lovin, [name], MD  glucose 4 GM chewable tablet Chew 4 tablets by mouth in the morning.   Yes [provider]  hydroxychloroquine (PLAQUENIL) 200 MG  tablet Take 400 mg by mouth daily.    Yes [provider]  levothyroxine (SYNTHROID) 50 MCG tablet Take 50 mcg by mouth daily before breakfast.    Yes [provider]  losartan (COZAAR) 50 MG tablet Take 50 mg by mouth daily.   Yes [provider]  metFORMIN (GLUCOPHAGE) 500 MG tablet Take 500-1,000 mg by mouth See admin instructions. Take 1000 mg by mouth in the morning and 500 mg in the evening   Yes [provider]  spironolactone (ALDACTONE) 25 MG tablet TAKE (1/2) TABLET DAILY. Patient taking differently: Take 12.5 mg by mouth every Monday, Wednesday, and Friday.  09/11/19  Yes Jaylynn Mcaleer, [name], MD  potassium chloride SA (KLOR-CON) 20 MEQ tablet Take 1 tablet (20 mEq total) by mouth 3 (three) times a week. Every Monday, Wednesday, and Friday 04/15/19   Sylva Overley, [name], MD    Past Medical History: Past Medical History:  Diagnosis Date  . Avascular necrosis ([place])   . Diabetes mellitus without complication ([place]) 63/8937  . Hypertension   . Hypothyroidism 01/2019  . Lupus ([place])   . Lupus nephritis ([place])   . Prediabetes 10/2011  . Pulmonary hypertension ([place])   . S/P minimally invasive tricuspid valve repair 11/04/2019   Complex valvuloplasty including autologous pericardial patch augmentation with suture plication of commissures and 32 mm Edwards mc3 ring annuloplasty via right mini thoracotomy approach  . Tricuspid regurgitation   . 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 bilat hip grafts     2007  . BUNIONECTOMY  2018  . HIP SURGERY     x2  . RIGHT HEART CATH N/A 01/02/2019   Procedure: RIGHT HEART CATH;  Surgeon: Jolaine Artist, MD;  Location: [place] [facility];  Service: Cardiovascular;  Laterality: N/A;  . RIGHT/LEFT HEART CATH AND CORONARY ANGIOGRAPHY N/A 07/11/2018   Procedure: RIGHT/LEFT HEART CATH AND CORONARY ANGIOGRAPHY;  Surgeon:  Jolaine Artist, MD;  Location: [place] [facility];  Service: Cardiovascular;  Laterality: N/A;  . SHOULDER SURGERY Bilateral 2015   Core displacement  . TEE WITHOUT CARDIOVERSION N/A 08/07/2019   Procedure: TRANSESOPHAGEAL ECHOCARDIOGRAM (TEE);  Surgeon: Jolaine Artist, MD;  Location: [facility] ENDOSCOPY;  Service: Cardiovascular;  Laterality: N/A;  . TOTAL HIP ARTHROPLASTY  08/2016 and 10/2016   x2  . UMBILICAL HERNIA REPAIR      Family History: Family History  Problem Relation Age of Onset  . Breast cancer Mother   . Heart disease Mother   . Cancer Mother   . Breast cancer Maternal Grandmother   . Cancer Maternal Grandmother   . Clotting disorder Father   . Hypertension Father   . Anemia Father   . Sleep apnea Father   . Cancer Maternal Grandfather     Social History: Social History   Socioeconomic History  . Marital status: Married    Spouse name: Not on file  . Number of children: 2  . Years of education: Not on file  . Highest education level: Not on file  Occupational History  . Not on file  Tobacco Use  . Smoking status: Never Smoker  . Smokeless tobacco: Never Used  Substance and Sexual Activity  . Alcohol use: No  . Drug use: No  . Sexual activity: Yes    Partners: Male    Birth control/protection: Pill, None    Comment: LoLoestrin Fe   Other Topics Concern  . Not on file  Social History Narrative  . Not on file   Social Determinants of Health   Financial Resource Strain:   . Difficulty of Paying Living Expenses:   Food Insecurity:   . Worried About Charity fundraiser in the Last Year:   . Arboriculturist in the Last Year:   Transportation Needs:   . Film/video editor (Medical):   Marland Kitchen Lack of Transportation (Non-Medical):   Physical Activity:   . Days of Exercise per Week:   . Minutes of Exercise per Session:   Stress:   . Feeling of Stress :   Social Connections:   . Frequency of Communication with Friends and Family:   . Frequency  of Social Gatherings with Friends and Family:   . Attends Religious Services:   . Active Member of Clubs or Organizations:   . Attends Archivist Meetings:   Marland Kitchen Marital Status:     Allergies:  Allergies  Allergen Reactions  . Lisinopril     angioedema  . Penicillins Hives    DID THE REACTION INVOLVE: Swelling of the face/tongue/throat, SOB, or low BP? Unknown Sudden or severe rash/hives, skin peeling, or the inside of the mouth or nose? Unknown Did it require medical treatment? Unknown When did it last happen?age 66 If all above answers are "NO", may proceed with cephalosporin use.     Objective:    Vital Signs:   Temp:  [97.8 F (36.6 C)-98.1 F (36.7 C)] 97.8 F (36.6  C) (04/07 1750) Pulse Rate:  [70-89] 89 (04/07 2000) Resp:  [11-20] 14 (04/07 2000) BP: (90-153)/(63-108) 123/86 (04/07 2000) SpO2:  [96 %-100 %] 100 % (04/07 2000) Arterial Line BP: (83-149)/(63-99) 123/89 (04/07 2000) FiO2 (%):  [50 %] 50 % (04/07 1645) Weight:  [71 kg] 71 kg (04/07 0700)    Weight change: Filed Weights   10/25/2019 0700  Weight: 71 kg    Intake/Output:   Intake/Output Summary (Last 24 hours) at 11/08/2019 2321 Last data filed at 10/30/2019 2248 Gross per 24 hour  Intake 21258.83 ml  Output 5305 ml  Net 15953.83 ml      Physical Exam    General:  Intubated/sedated HEENT: normal +ETT Neck: supple. RIJ swan. Carotids 2+ bilat; no bruits. No lymphadenopathy or thyromegaly appreciated. Cor: PMI nondisplaced. Regular rate & rhythm. Dressing in place Lungs: coarse. + CTs with bloody drainage Abdomen: obese distended. No hepatosplenomegaly. No bruits or masses. Hypoactive bowel sounds. Extremities: no cyanosis, clubbing, rash, 2+ edema Neuro:intubated sedated   Telemetry   Paced 90s Personally reviewed   Labs   Basic Metabolic Panel: Recent Labs  Lab 10/19/19 1230 11/05/2019 1711 10/16/2019 1732  NA 141 151* 144  K 4.8 2.6* 4.4  CL 115*  --   --   CO2  21*  --   --   GLUCOSE 149*  --   --   BUN 17  --   --   CREATININE 1.39*  --   --   CALCIUM 8.1*  --   --     Liver Function Tests: Recent Labs  Lab 10/19/19 1230  AST 32  ALT 31  ALKPHOS 70  BILITOT 0.4  PROT 5.5*  ALBUMIN 2.6*   No results for input(s): LIPASE, AMYLASE in the last 168 hours. No results for input(s): AMMONIA in the last 168 hours.  CBC: Recent Labs  Lab 10/19/19 1230 10/19/19 1230 10/29/2019 1240 11/09/2019 1641 11/03/2019 1711 11/06/2019 1732 11/11/2019 1749  WBC 2.4*  --   --  3.2*  --   --  3.0*  NEUTROABS  --   --   --   --   --   --  2.5  HGB 8.1*   < > 7.7* 9.6* 5.4* 7.1* 7.7*  HCT 25.3*   < > 23.3* 28.7* 16.0* 21.0* 23.3*  MCV 96.6  --   --  85.9  --   --  86.6  PLT 86*  --  38* 88*  --   --  77*   < > = values in this interval not displayed.    Cardiac Enzymes: No results for input(s): CKTOTAL, CKMB, CKMBINDEX, TROPONINI in the last 168 hours.  BNP: BNP (last 3 results) Recent Labs    04/15/19 1101  BNP 248.7*    ProBNP (last 3 results) No results for input(s): PROBNP in the last 8760 hours.   CBG: Recent Labs  Lab 11/05/2019 0755 10/18/2019 1707 11/06/2019 1728 11/12/2019 1744 11/09/2019 1849  GLUCAP 108* 20* 243* 176* 119*    Coagulation Studies: Recent Labs    10/19/19 1230 10/31/2019 1749  LABPROT 13.4 17.1*  INR 1.0 1.4*     Imaging   DG Chest 1V REPEAT Same Day  Result Date: 10/25/2019 CLINICAL DATA:  49 year old female with right-sided hemothorax. EXAM: CHEST - 1 VIEW SAME DAY COMPARISON:  Earlier radiograph dated 11/13/2019. FINDINGS: Interval decrease in aeration of the right lung and increase in diffuse opacity throughout the right lung since the earlier radiograph.  Findings likely represent progression of atelectasis or increase in the right pleural effusion, although asymmetric edema or pneumonia is not excluded. Clinical correlation and follow-up recommended. The left lung remains clear. No pneumothorax. Support  apparatus in similar position. Postsurgical changes of cardiac valve repair. No acute osseous pathology. IMPRESSION: 1. Interval decrease in the aeration of the right lung and increase in the diffuse opacity throughout the right lung compared to the earlier radiograph. No pneumothorax. 2. Stable positioning of the support apparatus. Electronically Signed   By: Anner Crete M.D.   On: 11/09/2019 19:03   DG Chest Port 1 View  Result Date: 11/12/2019 CLINICAL DATA:  Status post minimally invasive tricuspid valve repair. EXAM: PORTABLE CHEST 1 VIEW COMPARISON:  Preoperative radiograph 10/19/2019 FINDINGS: Endotracheal tube tip 3.6 cm from the carina. Tip and side port of the enteric tube below the diaphragm in the stomach. Left internal jugular central venous catheter tip projects over the upper SVC, the sheath appears in place. Right chest tube with tip directed towards the apex. Presumed mediastinal drain. Multiple epicardial wires in place. Interval cardiac valve replacement. Upper normal heart size with unchanged mediastinal contours. Right pleural effusion tracking superolaterally. Patchy airspace opacities throughout the right hemithorax. No visualized pneumothorax. Small left pleural effusion tracking medially. No acute osseous abnormalities are seen. IMPRESSION: 1. Endotracheal tube 3.6 cm from the carina. Enteric tube tip and side-port below the diaphragm. Left internal jugular central venous catheter tip projects over the upper SVC, sheath remains in place. 2. Cardiac valve replacement, tricuspid per history. Right chest tube and mediastinal drain in place. Right pleural effusion tracking superolaterally. Patchy airspace opacities throughout the right hemithorax likely atelectasis. 3. Small left pleural effusion tracking medially. Electronically Signed   By: Keith Rake M.D.   On: 10/19/2019 17:20      Medications:     Current Medications: . [MAR Hold] sodium chloride   Intravenous Once    . [MAR Hold] sodium chloride   Intravenous Once  . [MAR Hold] acetaminophen  1,000 mg Oral Q6H   Or  . [MAR Hold] acetaminophen (TYLENOL) oral liquid 160 mg/5 mL  1,000 mg Per Tube Q6H  . [MAR Hold] acetaminophen (TYLENOL) oral liquid 160 mg/5 mL  650 mg Per Tube Once   Or  . [MAR Hold] acetaminophen  650 mg Rectal Once  . [MAR Hold] aspirin EC  325 mg Oral Daily   Or  . [MAR Hold] aspirin  324 mg Per Tube Daily  . [MAR Hold] bisacodyl  10 mg Oral Daily   Or  . [MAR Hold] bisacodyl  10 mg Rectal Daily  . [MAR Hold] chlorhexidine  15 mL Mouth/Throat NOW  . [MAR Hold] docusate sodium  200 mg Oral Daily  . EPINEPHrine      . heparin-papaverine-plasmalyte irrigation   Irrigation To OR  . [place] Blood Cardioplegia vial (lidocaine/magnesium/mannitol 0.26g-4g-6.4g)   Intracoronary Once  . [MAR Hold] levothyroxine  50 mcg Oral Q0600  . magnesium sulfate  40 mEq Other To OR  . [MAR Hold] pantoprazole  40 mg Oral Daily  . phenylephrine  30-200 mcg/min Intravenous To OR  . potassium chloride  80 mEq Other To OR  . sodium bicarbonate      . [MAR Hold] sodium chloride flush  3 mL Intravenous Q12H  . tranexamic acid  15 mg/kg Intravenous To OR  . tranexamic acid  2 mg/kg Intracatheter To OR  . vancomycin 1000 mg in NS (1000 ml) irrigation for Dr. Roxy Manns  case   Irrigation To OR     Infusions: . sodium chloride 10 mL/hr at 10/16/2019 1650  . sodium chloride 100 mL/hr at 11/09/2019 1650  . [START ON 11/01/2019] sodium chloride    . sodium chloride    . [MAR Hold] albumin human 12.5 g (11/12/2019 1828)  . dexmedetomidine (PRECEDEX) IV infusion Stopped (11/13/2019 2030)  . dexmedetomidine    . DOPamine    . famotidine    . [MAR Hold] famotidine (PEPCID) IV    . heparin 30,000 units/NS 1000 mL solution for CELLSAVER    . insulin Stopped (11/05/2019 1951)  . [MAR Hold] lactated ringers    . lactated ringers    . lactated ringers 1,000 mL/hr at 11/06/2019 2104  . [MAR Hold] levofloxacin (LEVAQUIN) IV     . magnesium sulfate 4 g (11/08/2019 1904)  . milrinone    . [MAR Hold] nitroGLYCERIN Stopped (10/27/2019 2030)  . nitroGLYCERIN    . [MAR Hold] phenylephrine (NEO-SYNEPHRINE) Adult infusion Stopped (10/25/2019 2118)  . tranexamic acid (CYKLOKAPRON) infusion ([facility])    . [MAR Hold] vancomycin        Assessment/Plan   1. Post-cardiotomy hemorrhagic  shock - Patient with post-operative chest bleeding s/p minimally invasive TV repair on 11/05/2019 in setting of longstanding SLE - blood products have been replaced aggressively but continues to bleed - Discussed with Dr. Roxy Manns who has been at her bedside most of the night and taking back to OR for bleeding - Continue hemodynamic and blood product support  2. Severe TR - s/p mini-TV repair 4/7 - plan as above  3. Post-operative respiratory failure - remains intubated on 100%  - Progressive airspace disease in R lung after mini thoracotomy. Hopefully will improve with re-expansion  4. SLE - no change  Appreciate Dr. Guy Sandifer care. The AHF team will follow closely and support any way we can.   CRITICAL CARE Performed by: Glori Bickers  Total critical care time: 35 minutes  Critical care time was exclusive of separately billable procedures and treating other patients.  Critical care was necessary to treat or prevent imminent or life-threatening deterioration.  Critical care was time spent personally by me (independent of midlevel providers or residents) on the following activities: development of treatment plan with patient and/or surrogate as well as nursing, discussions with consultants, evaluation of patient's response to treatment, examination of patient, obtaining history from patient or surrogate, ordering and performing treatments and interventions, ordering and review of laboratory studies, ordering and review of radiographic studies, pulse oximetry and re-evaluation of patient's condition.   Length of Stay: 0  Glori Bickers,  MD  11/07/2019, 11:21 PM  Advanced Heart Failure Team Pager [phone] (M-F; 7a - 4p)  Please contact [facility] for night-coverage after hours (4p -7a ) and weekends on [email or web address]

## 2019-10-21 NOTE — Anesthesia Procedure Notes (Signed)
Arterial Line Insertion Start/End04/12/2019 9:23 AM, 10/16/2019 9:26 AM Performed by: Albertha Ghee, MD, anesthesiologist  Patient location: OR. Preanesthetic checklist: patient identified, IV checked, site marked, risks and benefits discussed, surgical consent, monitors and equipment checked, pre-op evaluation, timeout performed and anesthesia consent Lidocaine 1% used for infiltration and patient sedated Right, brachial was placed Catheter size: 20 G Hand hygiene performed  and maximum sterile barriers used  Allen's test indicative of satisfactory collateral circulation Attempts: 1 Procedure performed using ultrasound guided technique. Ultrasound Notes:anatomy identified, needle tip was noted to be adjacent to the nerve/plexus identified and no ultrasound evidence of intravascular and/or intraneural injection Following insertion, dressing applied and Biopatch. Post procedure assessment: normal  Patient tolerated the procedure well with no immediate complications.

## 2019-10-21 NOTE — Anesthesia Preprocedure Evaluation (Addendum)
Anesthesia Evaluation  Patient identified by MRN, date of birth, ID bandPreop documentation limited or incomplete due to emergent nature of procedure.  Airway Mallampati: Intubated       Dental   Pulmonary       + intubated    Cardiovascular hypertension,  Rhythm:Regular Rate:Normal     Neuro/Psych    GI/Hepatic Neg liver ROS,   Endo/Other  diabetesHypothyroidism   Renal/GU Renal disease     Musculoskeletal   Abdominal   Peds  Hematology  (+) anemia ,   Anesthesia Other Findings   Reproductive/Obstetrics                            Anesthesia Physical Anesthesia Plan  ASA: IV  Anesthesia Plan: General   Post-op Pain Management:    Induction: Intravenous  PONV Risk Score and Plan: 3 and Treatment may vary due to age or medical condition  Airway Management Planned: Oral ETT  Additional Equipment: Arterial line and PA Cath  Intra-op Plan:   Post-operative Plan: Post-operative intubation/ventilation  Informed Consent:   Plan Discussed with: CRNA, Anesthesiologist and Surgeon  Anesthesia Plan Comments:         Anesthesia Quick Evaluation

## 2019-10-22 ENCOUNTER — Inpatient Hospital Stay (HOSPITAL_COMMUNITY): Payer: BC Managed Care – PPO | Admitting: Certified Registered"

## 2019-10-22 ENCOUNTER — Inpatient Hospital Stay (HOSPITAL_COMMUNITY)
Admission: RE | Disposition: E | Payer: Self-pay | Source: Home / Self Care | Attending: Thoracic Surgery (Cardiothoracic Vascular Surgery)

## 2019-10-22 ENCOUNTER — Encounter: Payer: Self-pay | Admitting: *Deleted

## 2019-10-22 ENCOUNTER — Inpatient Hospital Stay (HOSPITAL_COMMUNITY): Payer: BC Managed Care – PPO

## 2019-10-22 DIAGNOSIS — J9601 Acute respiratory failure with hypoxia: Secondary | ICD-10-CM | POA: Diagnosis not present

## 2019-10-22 DIAGNOSIS — D689 Coagulation defect, unspecified: Secondary | ICD-10-CM | POA: Diagnosis present

## 2019-10-22 DIAGNOSIS — I9789 Other postprocedural complications and disorders of the circulatory system, not elsewhere classified: Secondary | ICD-10-CM

## 2019-10-22 DIAGNOSIS — I50813 Acute on chronic right heart failure: Secondary | ICD-10-CM | POA: Diagnosis not present

## 2019-10-22 HISTORY — PX: APPLICATION OF WOUND VAC: SHX5189

## 2019-10-22 HISTORY — PX: EXPLORATION POST OPERATIVE OPEN HEART: SHX5061

## 2019-10-22 HISTORY — PX: TEE WITHOUT CARDIOVERSION: SHX5443

## 2019-10-22 LAB — BASIC METABOLIC PANEL
Anion gap: 14 (ref 5–15)
Anion gap: 10 (ref 5–15)
Anion gap: 13 (ref 5–15)
Anion gap: 9 (ref 5–15)
Anion gap: 12 (ref 5–15)
BUN: 13 mg/dL (ref 6–20)
BUN: 12 mg/dL (ref 6–20)
BUN: 14 mg/dL (ref 6–20)
BUN: 15 mg/dL (ref 6–20)
BUN: 20 mg/dL (ref 6–20)
CO2: 17 mmol/L — ABNORMAL LOW (ref 22–32)
CO2: 17 mmol/L — ABNORMAL LOW (ref 22–32)
CO2: 28 mmol/L (ref 22–32)
CO2: 23 mmol/L (ref 22–32)
CO2: 25 mmol/L (ref 22–32)
Calcium: 5.6 mg/dL — CL (ref 8.9–10.3)
Calcium: 4.3 mg/dL — CL (ref 8.9–10.3)
Calcium: 6.5 mg/dL — ABNORMAL LOW (ref 8.9–10.3)
Calcium: 6.3 mg/dL — CL (ref 8.9–10.3)
Calcium: 7.4 mg/dL — ABNORMAL LOW (ref 8.9–10.3)
Chloride: 118 mmol/L — ABNORMAL HIGH (ref 98–111)
Chloride: 121 mmol/L — ABNORMAL HIGH (ref 98–111)
Chloride: 112 mmol/L — ABNORMAL HIGH (ref 98–111)
Chloride: 120 mmol/L — ABNORMAL HIGH (ref 98–111)
Chloride: 113 mmol/L — ABNORMAL HIGH (ref 98–111)
Creatinine, Ser: 1.16 mg/dL — ABNORMAL HIGH (ref 0.44–1.00)
Creatinine, Ser: 0.93 mg/dL (ref 0.44–1.00)
Creatinine, Ser: 1.42 mg/dL — ABNORMAL HIGH (ref 0.44–1.00)
Creatinine, Ser: 1.36 mg/dL — ABNORMAL HIGH (ref 0.44–1.00)
Creatinine, Ser: 1.78 mg/dL — ABNORMAL HIGH (ref 0.44–1.00)
GFR calc Af Amer: >60 mL/min (ref >60)
GFR calc Af Amer: >60 mL/min (ref >60)
GFR calc Af Amer: 51 mL/min — ABNORMAL LOW (ref >60)
GFR calc Af Amer: 54 mL/min — ABNORMAL LOW (ref >60)
GFR calc Af Amer: 39 mL/min — ABNORMAL LOW (ref >60)
GFR calc non Af Amer: 56 mL/min — ABNORMAL LOW (ref >60)
GFR calc non Af Amer: >60 mL/min (ref >60)
GFR calc non Af Amer: 44 mL/min — ABNORMAL LOW (ref >60)
GFR calc non Af Amer: 47 mL/min — ABNORMAL LOW (ref >60)
GFR calc non Af Amer: 34 mL/min — ABNORMAL LOW (ref >60)
Glucose, Bld: 168 mg/dL — ABNORMAL HIGH (ref 70–99)
Glucose, Bld: 141 mg/dL — ABNORMAL HIGH (ref 70–99)
Glucose, Bld: 181 mg/dL — ABNORMAL HIGH (ref 70–99)
Glucose, Bld: 126 mg/dL — ABNORMAL HIGH (ref 70–99)
Glucose, Bld: 109 mg/dL — ABNORMAL HIGH (ref 70–99)
Potassium: 4.5 mmol/L (ref 3.5–5.1)
Potassium: 3.9 mmol/L (ref 3.5–5.1)
Potassium: 4.1 mmol/L (ref 3.5–5.1)
Potassium: 3.7 mmol/L (ref 3.5–5.1)
Potassium: 4.2 mmol/L (ref 3.5–5.1)
Sodium: 149 mmol/L — ABNORMAL HIGH (ref 135–145)
Sodium: 148 mmol/L — ABNORMAL HIGH (ref 135–145)
Sodium: 153 mmol/L — ABNORMAL HIGH (ref 135–145)
Sodium: 152 mmol/L — ABNORMAL HIGH (ref 135–145)
Sodium: 150 mmol/L — ABNORMAL HIGH (ref 135–145)

## 2019-10-22 LAB — POCT I-STAT 7, (LYTES, BLD GAS, ICA,H+H)
Acid-Base Excess: 2 mmol/L (ref 0.0–2.0)
Acid-Base Excess: 8 mmol/L — ABNORMAL HIGH (ref 0.0–2.0)
Acid-Base Excess: 7 mmol/L — ABNORMAL HIGH (ref 0.0–2.0)
Acid-base deficit: 1 mmol/L (ref 0.0–2.0)
Acid-base deficit: 3 mmol/L — ABNORMAL HIGH (ref 0.0–2.0)
Acid-base deficit: 2 mmol/L (ref 0.0–2.0)
Acid-base deficit: 1 mmol/L (ref 0.0–2.0)
Bicarbonate: 26 mmol/L (ref 20.0–28.0)
Bicarbonate: 23.7 mmol/L (ref 20.0–28.0)
Bicarbonate: 21.7 mmol/L (ref 20.0–28.0)
Bicarbonate: 23.5 mmol/L (ref 20.0–28.0)
Bicarbonate: 24.2 mmol/L (ref 20.0–28.0)
Bicarbonate: 22.2 mmol/L (ref 20.0–28.0)
Bicarbonate: 31.3 mmol/L — ABNORMAL HIGH (ref 20.0–28.0)
Bicarbonate: 31 mmol/L — ABNORMAL HIGH (ref 20.0–28.0)
Calcium, Ion: 1.05 mmol/L — ABNORMAL LOW (ref 1.15–1.40)
Calcium, Ion: 1.1 mmol/L — ABNORMAL LOW (ref 1.15–1.40)
Calcium, Ion: 1.06 mmol/L — ABNORMAL LOW (ref 1.15–1.40)
Calcium, Ion: 1.11 mmol/L — ABNORMAL LOW (ref 1.15–1.40)
Calcium, Ion: 1.31 mmol/L (ref 1.15–1.40)
Calcium, Ion: 0.84 mmol/L — CL (ref 1.15–1.40)
Calcium, Ion: 0.96 mmol/L — ABNORMAL LOW (ref 1.15–1.40)
Calcium, Ion: 1.09 mmol/L — ABNORMAL LOW (ref 1.15–1.40)
HCT: 21 % — ABNORMAL LOW (ref 36.0–46.0)
HCT: 22 % — ABNORMAL LOW (ref 36.0–46.0)
HCT: 21 % — ABNORMAL LOW (ref 36.0–46.0)
HCT: 23 % — ABNORMAL LOW (ref 36.0–46.0)
HCT: 28 % — ABNORMAL LOW (ref 36.0–46.0)
HCT: 21 % — ABNORMAL LOW (ref 36.0–46.0)
HCT: 29 % — ABNORMAL LOW (ref 36.0–46.0)
HCT: 19 % — ABNORMAL LOW (ref 36.0–46.0)
Hemoglobin: 7.1 g/dL — ABNORMAL LOW (ref 12.0–15.0)
Hemoglobin: 7.5 g/dL — ABNORMAL LOW (ref 12.0–15.0)
Hemoglobin: 7.1 g/dL — ABNORMAL LOW (ref 12.0–15.0)
Hemoglobin: 7.8 g/dL — ABNORMAL LOW (ref 12.0–15.0)
Hemoglobin: 9.5 g/dL — ABNORMAL LOW (ref 12.0–15.0)
Hemoglobin: 7.1 g/dL — ABNORMAL LOW (ref 12.0–15.0)
Hemoglobin: 9.9 g/dL — ABNORMAL LOW (ref 12.0–15.0)
Hemoglobin: 6.5 g/dL — CL (ref 12.0–15.0)
O2 Saturation: 100 %
O2 Saturation: 100 %
O2 Saturation: 100 %
O2 Saturation: 100 %
O2 Saturation: 100 %
O2 Saturation: 100 %
O2 Saturation: 100 %
O2 Saturation: 100 %
Patient temperature: 36.3
Patient temperature: 36.4
Patient temperature: 36.6
Potassium: 4.3 mmol/L (ref 3.5–5.1)
Potassium: 4.8 mmol/L (ref 3.5–5.1)
Potassium: 5.1 mmol/L (ref 3.5–5.1)
Potassium: 6.3 mmol/L (ref 3.5–5.1)
Potassium: 4.9 mmol/L (ref 3.5–5.1)
Potassium: 2.8 mmol/L — ABNORMAL LOW (ref 3.5–5.1)
Potassium: 3.9 mmol/L (ref 3.5–5.1)
Potassium: 4.1 mmol/L (ref 3.5–5.1)
Sodium: 144 mmol/L (ref 135–145)
Sodium: 142 mmol/L (ref 135–145)
Sodium: 137 mmol/L (ref 135–145)
Sodium: 142 mmol/L (ref 135–145)
Sodium: 143 mmol/L (ref 135–145)
Sodium: 151 mmol/L — ABNORMAL HIGH (ref 135–145)
Sodium: 154 mmol/L — ABNORMAL HIGH (ref 135–145)
Sodium: 151 mmol/L — ABNORMAL HIGH (ref 135–145)
TCO2: 27 mmol/L (ref 22–32)
TCO2: 25 mmol/L (ref 22–32)
TCO2: 23 mmol/L (ref 22–32)
TCO2: 25 mmol/L (ref 22–32)
TCO2: 26 mmol/L (ref 22–32)
TCO2: 23 mmol/L (ref 22–32)
TCO2: 32 mmol/L (ref 22–32)
TCO2: 32 mmol/L (ref 22–32)
pCO2 arterial: 37.7 mmHg (ref 32.0–48.0)
pCO2 arterial: 33.2 mmHg (ref 32.0–48.0)
pCO2 arterial: 34.8 mmHg (ref 32.0–48.0)
pCO2 arterial: 37.1 mmHg (ref 32.0–48.0)
pCO2 arterial: 47.2 mmHg (ref 32.0–48.0)
pCO2 arterial: 27.7 mmHg — ABNORMAL LOW (ref 32.0–48.0)
pCO2 arterial: 34.9 mmHg (ref 32.0–48.0)
pCO2 arterial: 41.5 mmHg (ref 32.0–48.0)
pH, Arterial: 7.446 (ref 7.350–7.450)
pH, Arterial: 7.462 — ABNORMAL HIGH (ref 7.350–7.450)
pH, Arterial: 7.403 (ref 7.350–7.450)
pH, Arterial: 7.41 (ref 7.350–7.450)
pH, Arterial: 7.318 — ABNORMAL LOW (ref 7.350–7.450)
pH, Arterial: 7.51 — ABNORMAL HIGH (ref 7.350–7.450)
pH, Arterial: 7.559 — ABNORMAL HIGH (ref 7.350–7.450)
pH, Arterial: 7.481 — ABNORMAL HIGH (ref 7.350–7.450)
pO2, Arterial: 433 mmHg — ABNORMAL HIGH (ref 83.0–108.0)
pO2, Arterial: 375 mmHg — ABNORMAL HIGH (ref 83.0–108.0)
pO2, Arterial: 383 mmHg — ABNORMAL HIGH (ref 83.0–108.0)
pO2, Arterial: 389 mmHg — ABNORMAL HIGH (ref 83.0–108.0)
pO2, Arterial: 550 mmHg — ABNORMAL HIGH (ref 83.0–108.0)
pO2, Arterial: 429 mmHg — ABNORMAL HIGH (ref 83.0–108.0)
pO2, Arterial: 529 mmHg — ABNORMAL HIGH (ref 83.0–108.0)
pO2, Arterial: 477 mmHg — ABNORMAL HIGH (ref 83.0–108.0)

## 2019-10-22 LAB — BPAM FFP
Blood Product Expiration Date: 202104072359
Blood Product Expiration Date: 202104112359
ISSUE DATE / TIME: 202104071522
ISSUE DATE / TIME: 202104071522
Unit Type and Rh: 6200
Unit Type and Rh: 8400

## 2019-10-22 LAB — BPAM CRYOPRECIPITATE
Blood Product Expiration Date: 202104072208
Blood Product Expiration Date: 202104080310
Blood Product Expiration Date: 202104080341
Blood Product Expiration Date: 202104080341
Blood Product Expiration Date: 202104080341
ISSUE DATE / TIME: 202104071635
ISSUE DATE / TIME: 202104072143
ISSUE DATE / TIME: 202104072158
ISSUE DATE / TIME: 202104072158
ISSUE DATE / TIME: 202104072202
Unit Type and Rh: 6200
Unit Type and Rh: 6200
Unit Type and Rh: 6200
Unit Type and Rh: 6200
Unit Type and Rh: 6200

## 2019-10-22 LAB — PROTIME-INR
INR: 2 — ABNORMAL HIGH (ref 0.8–1.2)
INR: 1.5 — ABNORMAL HIGH (ref 0.8–1.2)
Prothrombin Time: 22.1 seconds — ABNORMAL HIGH (ref 11.4–15.2)
Prothrombin Time: 18 seconds — ABNORMAL HIGH (ref 11.4–15.2)

## 2019-10-22 LAB — GLUCOSE, CAPILLARY
Glucose-Capillary: 94 mg/dL (ref 70–99)
Glucose-Capillary: 115 mg/dL — ABNORMAL HIGH (ref 70–99)
Glucose-Capillary: 158 mg/dL — ABNORMAL HIGH (ref 70–99)
Glucose-Capillary: 152 mg/dL — ABNORMAL HIGH (ref 70–99)
Glucose-Capillary: 165 mg/dL — ABNORMAL HIGH (ref 70–99)
Glucose-Capillary: 133 mg/dL — ABNORMAL HIGH (ref 70–99)
Glucose-Capillary: 150 mg/dL — ABNORMAL HIGH (ref 70–99)
Glucose-Capillary: 146 mg/dL — ABNORMAL HIGH (ref 70–99)
Glucose-Capillary: 122 mg/dL — ABNORMAL HIGH (ref 70–99)
Glucose-Capillary: 98 mg/dL (ref 70–99)
Glucose-Capillary: 95 mg/dL (ref 70–99)
Glucose-Capillary: 117 mg/dL — ABNORMAL HIGH (ref 70–99)
Glucose-Capillary: 99 mg/dL (ref 70–99)
Glucose-Capillary: 83 mg/dL (ref 70–99)

## 2019-10-22 LAB — PREPARE FRESH FROZEN PLASMA: Unit division: 0

## 2019-10-22 LAB — CBC
HCT: 26 % — ABNORMAL LOW (ref 36.0–46.0)
HCT: 26.5 % — ABNORMAL LOW (ref 36.0–46.0)
HCT: 14.1 % — ABNORMAL LOW (ref 36.0–46.0)
HCT: 11.7 % — ABNORMAL LOW (ref 36.0–46.0)
HCT: 32.4 % — ABNORMAL LOW (ref 36.0–46.0)
HCT: 27.6 % — ABNORMAL LOW (ref 36.0–46.0)
HCT: 22.7 % — ABNORMAL LOW (ref 36.0–46.0)
Hemoglobin: 8.8 g/dL — ABNORMAL LOW (ref 12.0–15.0)
Hemoglobin: 8.9 g/dL — ABNORMAL LOW (ref 12.0–15.0)
Hemoglobin: 4.7 g/dL — CL (ref 12.0–15.0)
Hemoglobin: 3.9 g/dL — CL (ref 12.0–15.0)
Hemoglobin: 11.4 g/dL — ABNORMAL LOW (ref 12.0–15.0)
Hemoglobin: 9.7 g/dL — ABNORMAL LOW (ref 12.0–15.0)
Hemoglobin: 8 g/dL — ABNORMAL LOW (ref 12.0–15.0)
MCH: 28.3 pg (ref 26.0–34.0)
MCH: 29.5 pg (ref 26.0–34.0)
MCH: 29.7 pg (ref 26.0–34.0)
MCH: 29.3 pg (ref 26.0–34.0)
MCH: 29.6 pg (ref 26.0–34.0)
MCH: 29.3 pg (ref 26.0–34.0)
MCH: 29.3 pg (ref 26.0–34.0)
MCHC: 33.8 g/dL (ref 30.0–36.0)
MCHC: 33.6 g/dL (ref 30.0–36.0)
MCHC: 33.3 g/dL (ref 30.0–36.0)
MCHC: 33.3 g/dL (ref 30.0–36.0)
MCHC: 35.2 g/dL (ref 30.0–36.0)
MCHC: 35.1 g/dL (ref 30.0–36.0)
MCHC: 35.2 g/dL (ref 30.0–36.0)
MCV: 83.6 fL (ref 80.0–100.0)
MCV: 87.7 fL (ref 80.0–100.0)
MCV: 89.2 fL (ref 80.0–100.0)
MCV: 88 fL (ref 80.0–100.0)
MCV: 84.2 fL (ref 80.0–100.0)
MCV: 83.4 fL (ref 80.0–100.0)
MCV: 83.2 fL (ref 80.0–100.0)
Platelets: 121 10*3/uL — ABNORMAL LOW (ref 150–400)
Platelets: 91 10*3/uL — ABNORMAL LOW (ref 150–400)
Platelets: 59 10*3/uL — ABNORMAL LOW (ref 150–400)
Platelets: 170 10*3/uL (ref 150–400)
Platelets: 100 10*3/uL — ABNORMAL LOW (ref 150–400)
Platelets: 74 10*3/uL — ABNORMAL LOW (ref 150–400)
Platelets: 61 10*3/uL — ABNORMAL LOW (ref 150–400)
RBC: 3.11 MIL/uL — ABNORMAL LOW (ref 3.87–5.11)
RBC: 3.02 MIL/uL — ABNORMAL LOW (ref 3.87–5.11)
RBC: 1.58 MIL/uL — ABNORMAL LOW (ref 3.87–5.11)
RBC: 1.33 MIL/uL — ABNORMAL LOW (ref 3.87–5.11)
RBC: 3.85 MIL/uL — ABNORMAL LOW (ref 3.87–5.11)
RBC: 3.31 MIL/uL — ABNORMAL LOW (ref 3.87–5.11)
RBC: 2.73 MIL/uL — ABNORMAL LOW (ref 3.87–5.11)
RDW: 16.5 % — ABNORMAL HIGH (ref 11.5–15.5)
RDW: 16.9 % — ABNORMAL HIGH (ref 11.5–15.5)
RDW: 15.2 % (ref 11.5–15.5)
RDW: 14 % (ref 11.5–15.5)
RDW: 15.5 % (ref 11.5–15.5)
RDW: 15.3 % (ref 11.5–15.5)
RDW: 15.4 % (ref 11.5–15.5)
WBC: 2.2 10*3/uL — ABNORMAL LOW (ref 4.0–10.5)
WBC: 1.1 10*3/uL — CL (ref 4.0–10.5)
WBC: 0.6 10*3/uL — CL (ref 4.0–10.5)
WBC: 1 10*3/uL — CL (ref 4.0–10.5)
WBC: 2.2 10*3/uL — ABNORMAL LOW (ref 4.0–10.5)
WBC: 2.7 10*3/uL — ABNORMAL LOW (ref 4.0–10.5)
WBC: 2.9 10*3/uL — ABNORMAL LOW (ref 4.0–10.5)
nRBC: 0 % (ref 0.0–0.2)
nRBC: 0 % (ref 0.0–0.2)
nRBC: 0 % (ref 0.0–0.2)
nRBC: 0 % (ref 0.0–0.2)
nRBC: 0 % (ref 0.0–0.2)
nRBC: 0 % (ref 0.0–0.2)
nRBC: 0 % (ref 0.0–0.2)

## 2019-10-22 LAB — PREPARE CRYOPRECIPITATE
Unit division: 0
Unit division: 0
Unit division: 0
Unit division: 0
Unit division: 0

## 2019-10-22 LAB — PREPARE PLATELET PHERESIS
Unit division: 0
Unit division: 0
Unit division: 0
Unit division: 0
Unit division: 0
Unit division: 0
Unit division: 0
Unit division: 0

## 2019-10-22 LAB — POCT I-STAT, CHEM 8
BUN: 16 mg/dL (ref 6–20)
BUN: 14 mg/dL (ref 6–20)
BUN: 14 mg/dL (ref 6–20)
BUN: 15 mg/dL (ref 6–20)
BUN: 15 mg/dL (ref 6–20)
BUN: 16 mg/dL (ref 6–20)
BUN: 16 mg/dL (ref 6–20)
BUN: 16 mg/dL (ref 6–20)
BUN: 15 mg/dL (ref 6–20)
Calcium, Ion: 1.24 mmol/L (ref 1.15–1.40)
Calcium, Ion: 0.98 mmol/L — ABNORMAL LOW (ref 1.15–1.40)
Calcium, Ion: 1.07 mmol/L — ABNORMAL LOW (ref 1.15–1.40)
Calcium, Ion: 1.18 mmol/L (ref 1.15–1.40)
Calcium, Ion: 1.09 mmol/L — ABNORMAL LOW (ref 1.15–1.40)
Calcium, Ion: 1.12 mmol/L — ABNORMAL LOW (ref 1.15–1.40)
Calcium, Ion: 1.08 mmol/L — ABNORMAL LOW (ref 1.15–1.40)
Calcium, Ion: 1.12 mmol/L — ABNORMAL LOW (ref 1.15–1.40)
Calcium, Ion: 1.29 mmol/L (ref 1.15–1.40)
Chloride: 110 mmol/L (ref 98–111)
Chloride: 108 mmol/L (ref 98–111)
Chloride: 109 mmol/L (ref 98–111)
Chloride: 113 mmol/L — ABNORMAL HIGH (ref 98–111)
Chloride: 110 mmol/L (ref 98–111)
Chloride: 110 mmol/L (ref 98–111)
Chloride: 109 mmol/L (ref 98–111)
Chloride: 110 mmol/L (ref 98–111)
Chloride: 111 mmol/L (ref 98–111)
Creatinine, Ser: 1.1 mg/dL — ABNORMAL HIGH (ref 0.44–1.00)
Creatinine, Ser: 0.9 mg/dL (ref 0.44–1.00)
Creatinine, Ser: 1 mg/dL (ref 0.44–1.00)
Creatinine, Ser: 1 mg/dL (ref 0.44–1.00)
Creatinine, Ser: 1 mg/dL (ref 0.44–1.00)
Creatinine, Ser: 1 mg/dL (ref 0.44–1.00)
Creatinine, Ser: 1.1 mg/dL — ABNORMAL HIGH (ref 0.44–1.00)
Creatinine, Ser: 1.1 mg/dL — ABNORMAL HIGH (ref 0.44–1.00)
Creatinine, Ser: 1 mg/dL (ref 0.44–1.00)
Glucose, Bld: 92 mg/dL (ref 70–99)
Glucose, Bld: 49 mg/dL — ABNORMAL LOW (ref 70–99)
Glucose, Bld: 79 mg/dL (ref 70–99)
Glucose, Bld: 80 mg/dL (ref 70–99)
Glucose, Bld: 151 mg/dL — ABNORMAL HIGH (ref 70–99)
Glucose, Bld: 124 mg/dL — ABNORMAL HIGH (ref 70–99)
Glucose, Bld: 84 mg/dL (ref 70–99)
Glucose, Bld: 96 mg/dL (ref 70–99)
Glucose, Bld: 97 mg/dL (ref 70–99)
HCT: 21 % — ABNORMAL LOW (ref 36.0–46.0)
HCT: 17 % — ABNORMAL LOW (ref 36.0–46.0)
HCT: 20 % — ABNORMAL LOW (ref 36.0–46.0)
HCT: 21 % — ABNORMAL LOW (ref 36.0–46.0)
HCT: 24 % — ABNORMAL LOW (ref 36.0–46.0)
HCT: 22 % — ABNORMAL LOW (ref 36.0–46.0)
HCT: 20 % — ABNORMAL LOW (ref 36.0–46.0)
HCT: 24 % — ABNORMAL LOW (ref 36.0–46.0)
HCT: 29 % — ABNORMAL LOW (ref 36.0–46.0)
Hemoglobin: 7.1 g/dL — ABNORMAL LOW (ref 12.0–15.0)
Hemoglobin: 5.8 g/dL — CL (ref 12.0–15.0)
Hemoglobin: 6.8 g/dL — CL (ref 12.0–15.0)
Hemoglobin: 7.1 g/dL — ABNORMAL LOW (ref 12.0–15.0)
Hemoglobin: 8.2 g/dL — ABNORMAL LOW (ref 12.0–15.0)
Hemoglobin: 7.5 g/dL — ABNORMAL LOW (ref 12.0–15.0)
Hemoglobin: 6.8 g/dL — CL (ref 12.0–15.0)
Hemoglobin: 8.2 g/dL — ABNORMAL LOW (ref 12.0–15.0)
Hemoglobin: 9.9 g/dL — ABNORMAL LOW (ref 12.0–15.0)
Potassium: 3.9 mmol/L (ref 3.5–5.1)
Potassium: 3.6 mmol/L (ref 3.5–5.1)
Potassium: 4.2 mmol/L (ref 3.5–5.1)
Potassium: 4.9 mmol/L (ref 3.5–5.1)
Potassium: 4.8 mmol/L (ref 3.5–5.1)
Potassium: 4.6 mmol/L (ref 3.5–5.1)
Potassium: 4.7 mmol/L (ref 3.5–5.1)
Potassium: 5.4 mmol/L — ABNORMAL HIGH (ref 3.5–5.1)
Potassium: 4.9 mmol/L (ref 3.5–5.1)
Sodium: 142 mmol/L (ref 135–145)
Sodium: 142 mmol/L (ref 135–145)
Sodium: 142 mmol/L (ref 135–145)
Sodium: 144 mmol/L (ref 135–145)
Sodium: 141 mmol/L (ref 135–145)
Sodium: 141 mmol/L (ref 135–145)
Sodium: 141 mmol/L (ref 135–145)
Sodium: 142 mmol/L (ref 135–145)
Sodium: 142 mmol/L (ref 135–145)
TCO2: 29 mmol/L (ref 22–32)
TCO2: 23 mmol/L (ref 22–32)
TCO2: 25 mmol/L (ref 22–32)
TCO2: 26 mmol/L (ref 22–32)
TCO2: 25 mmol/L (ref 22–32)
TCO2: 26 mmol/L (ref 22–32)
TCO2: 26 mmol/L (ref 22–32)
TCO2: 28 mmol/L (ref 22–32)
TCO2: 26 mmol/L (ref 22–32)

## 2019-10-22 LAB — BPAM PLATELET PHERESIS
Blood Product Expiration Date: 202104072359
Blood Product Expiration Date: 202104092359
Blood Product Expiration Date: 202104092359
Blood Product Expiration Date: 202104092359
Blood Product Expiration Date: 202104102359
Blood Product Expiration Date: 202104102359
Blood Product Expiration Date: 202104102359
Blood Product Expiration Date: 202104102359
ISSUE DATE / TIME: 202104071311
ISSUE DATE / TIME: 202104071311
ISSUE DATE / TIME: 202104071904
ISSUE DATE / TIME: 202104071904
ISSUE DATE / TIME: 202104071925
ISSUE DATE / TIME: 202104071925
ISSUE DATE / TIME: 202104072144
ISSUE DATE / TIME: 202104072144
Unit Type and Rh: 5100
Unit Type and Rh: 6200
Unit Type and Rh: 5100
Unit Type and Rh: 6200
Unit Type and Rh: 6200
Unit Type and Rh: 7300
Unit Type and Rh: 8400
Unit Type and Rh: 9500

## 2019-10-22 LAB — PREPARE RBC (CROSSMATCH)

## 2019-10-22 LAB — HEPATIC FUNCTION PANEL
ALT: 11 U/L (ref 0–44)
ALT: 13 U/L (ref 0–44)
AST: 43 U/L — ABNORMAL HIGH (ref 15–41)
AST: 28 U/L (ref 15–41)
Albumin: 1.9 g/dL — ABNORMAL LOW (ref 3.5–5.0)
Albumin: 1.9 g/dL — ABNORMAL LOW (ref 3.5–5.0)
Alkaline Phosphatase: 12 U/L — ABNORMAL LOW (ref 38–126)
Alkaline Phosphatase: 25 U/L — ABNORMAL LOW (ref 38–126)
Bilirubin, Direct: 0.4 mg/dL — ABNORMAL HIGH (ref 0.0–0.2)
Bilirubin, Direct: 0.2 mg/dL (ref 0.0–0.2)
Indirect Bilirubin: 0.8 mg/dL (ref 0.3–0.9)
Indirect Bilirubin: 0.5 mg/dL (ref 0.3–0.9)
Total Bilirubin: 1.2 mg/dL (ref 0.3–1.2)
Total Bilirubin: 0.7 mg/dL (ref 0.3–1.2)
Total Protein: <3 g/dL — ABNORMAL LOW (ref 6.5–8.1)
Total Protein: 3.1 g/dL — ABNORMAL LOW (ref 6.5–8.1)

## 2019-10-22 LAB — LACTATE DEHYDROGENASE: LDH: 403 U/L — ABNORMAL HIGH (ref 98–192)

## 2019-10-22 LAB — APTT
aPTT: 68 seconds — ABNORMAL HIGH (ref 24–36)
aPTT: 42 seconds — ABNORMAL HIGH (ref 24–36)
aPTT: 40 seconds — ABNORMAL HIGH (ref 24–36)
aPTT: 43 seconds — ABNORMAL HIGH (ref 24–36)

## 2019-10-22 LAB — HEPARIN LEVEL (UNFRACTIONATED)
Heparin Unfractionated: <0.1 IU/mL — ABNORMAL LOW (ref 0.30–0.70)
Heparin Unfractionated: <0.1 IU/mL — ABNORMAL LOW (ref 0.30–0.70)
Heparin Unfractionated: <0.1 IU/mL — ABNORMAL LOW (ref 0.30–0.70)

## 2019-10-22 LAB — MAGNESIUM
Magnesium: 1.3 mg/dL — ABNORMAL LOW (ref 1.7–2.4)
Magnesium: 1.6 mg/dL — ABNORMAL LOW (ref 1.7–2.4)

## 2019-10-22 LAB — LACTIC ACID, PLASMA
Lactic Acid, Venous: 3 mmol/L (ref 0.5–1.9)
Lactic Acid, Venous: 2.3 mmol/L (ref 0.5–1.9)

## 2019-10-22 LAB — FIBRINOGEN
Fibrinogen: 167 mg/dL — ABNORMAL LOW (ref 210–475)
Fibrinogen: 262 mg/dL (ref 210–475)
Fibrinogen: 158 mg/dL — ABNORMAL LOW (ref 210–475)
Fibrinogen: 209 mg/dL — ABNORMAL LOW (ref 210–475)

## 2019-10-22 SURGERY — EXPLORATION POST OPERATIVE OPEN HEART
Anesthesia: General | Site: Chest

## 2019-10-22 MED ORDER — SODIUM CHLORIDE 0.9% IV SOLUTION: Freq: Once | INTRAVENOUS | Status: AC

## 2019-10-22 MED ORDER — VANCOMYCIN HCL 1000 MG IV SOLR
INTRAVENOUS | Status: AC
  Administered 2019-10-22: 1000 mL
  Filled 2019-10-22: qty 1000

## 2019-10-22 MED ORDER — DEXTROSE-NACL 5-0.45 % IV SOLN: INTRAVENOUS | Status: DC

## 2019-10-22 MED ORDER — FENTANYL CITRATE (PF) 250 MCG/5ML IJ SOLN
INTRAMUSCULAR | Status: AC
  Filled 2019-10-22: qty 5

## 2019-10-22 MED ORDER — CLEVIDIPINE BUTYRATE 0.5 MG/ML IV EMUL
INTRAVENOUS | Status: DC | PRN
  Administered 2019-10-22: 4 mg/h via INTRAVENOUS

## 2019-10-22 MED ORDER — POTASSIUM CHLORIDE 2 MEQ/ML IV SOLN
80.0000 meq | INTRAVENOUS | Status: DC
  Filled 2019-10-22: qty 40

## 2019-10-22 MED ORDER — INSULIN REGULAR(HUMAN) IN NACL 100-0.9 UT/100ML-% IV SOLN
INTRAVENOUS | Status: DC
  Filled 2019-10-22: qty 100

## 2019-10-22 MED ORDER — FENTANYL CITRATE (PF) 250 MCG/5ML IJ SOLN
INTRAMUSCULAR | Status: DC | PRN
  Administered 2019-10-22: 150 ug via INTRAVENOUS
  Administered 2019-10-22: 100 ug via INTRAVENOUS

## 2019-10-22 MED ORDER — MIDAZOLAM HCL 2 MG/2ML IJ SOLN
INTRAMUSCULAR | Status: AC
  Filled 2019-10-22: qty 2

## 2019-10-22 MED ORDER — SODIUM CHLORIDE 0.9% IV SOLUTION: Freq: Once | INTRAVENOUS | Status: DC

## 2019-10-22 MED ORDER — SODIUM BICARBONATE 8.4 % IV SOLN
INTRAVENOUS | Status: AC
  Filled 2019-10-22: qty 150

## 2019-10-22 MED ORDER — MIDAZOLAM HCL 5 MG/5ML IJ SOLN
INTRAMUSCULAR | Status: DC | PRN
  Administered 2019-10-22: 2 mg via INTRAVENOUS

## 2019-10-22 MED ORDER — PHENYLEPHRINE HCL-NACL 20-0.9 MG/250ML-% IV SOLN
30.0000 ug/min | INTRAVENOUS | Status: DC
  Filled 2019-10-22: qty 250

## 2019-10-22 MED ORDER — SODIUM CHLORIDE (PF) 0.9 % IJ SOLN: OROMUCOSAL | Status: DC | PRN

## 2019-10-22 MED ORDER — CALCIUM CHLORIDE 10 % IV SOLN
2.0000 g | Freq: Once | INTRAVENOUS | Status: AC
  Administered 2019-10-22: 2 g via INTRAVENOUS

## 2019-10-22 MED ORDER — ALBUMIN HUMAN 5 % IV SOLN
INTRAVENOUS | Status: AC
  Administered 2019-10-22: 12.5 g via INTRAVENOUS
  Filled 2019-10-22: qty 750

## 2019-10-22 MED ORDER — NITROGLYCERIN IN D5W 200-5 MCG/ML-% IV SOLN: 0.0000 ug/min | INTRAVENOUS | Status: DC

## 2019-10-22 MED ORDER — ALBUMIN HUMAN 5 % IV SOLN
12.5000 g | INTRAVENOUS | Status: DC | PRN
  Administered 2019-10-22 (×2): 12.5 g via INTRAVENOUS

## 2019-10-22 MED ORDER — SODIUM CHLORIDE 0.9 % IR SOLN
Status: DC | PRN
  Administered 2019-10-22: 4000 mL

## 2019-10-22 MED ORDER — THROMBIN (RECOMBINANT) 20000 UNITS EX SOLR
CUTANEOUS | Status: AC
  Filled 2019-10-22: qty 20000

## 2019-10-22 MED ORDER — MAGNESIUM SULFATE 4 GM/100ML IV SOLN
4.0000 g | Freq: Once | INTRAVENOUS | Status: AC
  Administered 2019-10-22: 4 g via INTRAVENOUS
  Filled 2019-10-22: qty 100

## 2019-10-22 MED ORDER — HEMOSTATIC AGENTS (NO CHARGE) OPTIME
TOPICAL | Status: DC | PRN
  Administered 2019-10-22 (×2): 1 via TOPICAL

## 2019-10-22 MED ORDER — EPINEPHRINE HCL 5 MG/250ML IV SOLN IN NS: 0.0000 ug/min | INTRAVENOUS | Status: DC

## 2019-10-22 MED ORDER — ALBUMIN HUMAN 5 % IV SOLN
INTRAVENOUS | Status: AC
  Administered 2019-10-22: 12.5 g via INTRAVENOUS
  Filled 2019-10-22: qty 250

## 2019-10-22 MED ORDER — NOREPINEPHRINE 16 MG/250ML-% IV SOLN
0.0000 ug/min | INTRAVENOUS | Status: DC
  Administered 2019-10-23: 5 ug/min via INTRAVENOUS
  Administered 2019-10-27: 2 ug/min via INTRAVENOUS
  Filled 2019-10-22 (×4): qty 250

## 2019-10-22 MED ORDER — CLEVIDIPINE BUTYRATE 0.5 MG/ML IV EMUL
INTRAVENOUS | Status: DC | PRN
  Administered 2019-10-22: 6 mg/h via INTRAVENOUS

## 2019-10-22 MED ORDER — EPINEPHRINE HCL 5 MG/250ML IV SOLN IN NS
0.0000 ug/min | INTRAVENOUS | Status: DC
  Filled 2019-10-22: qty 250

## 2019-10-22 MED ORDER — CALCIUM CHLORIDE 10 % IV SOLN
INTRAVENOUS | Status: DC | PRN
  Administered 2019-10-22 (×8): 100 mg via INTRAVENOUS

## 2019-10-22 MED ORDER — PHENYLEPHRINE 40 MCG/ML (10ML) SYRINGE FOR IV PUSH (FOR BLOOD PRESSURE SUPPORT)
PREFILLED_SYRINGE | INTRAVENOUS | Status: AC
  Filled 2019-10-22: qty 10

## 2019-10-22 MED ORDER — PROTHROMBIN COMPLEX CONC HUMAN 500 UNITS IV KIT
1668.0000 [IU] | PACK | Status: AC
  Administered 2019-10-22: 1668 [IU] via INTRAVENOUS
  Filled 2019-10-22: qty 668

## 2019-10-22 MED ORDER — SODIUM CHLORIDE 0.9 % IV SOLN
20.0000 ug | Freq: Once | INTRAVENOUS | Status: AC
  Administered 2019-10-22: 20 ug via INTRAVENOUS

## 2019-10-22 MED ORDER — TRANEXAMIC ACID 1000 MG/10ML IV SOLN
1.5000 mg/kg/h | INTRAVENOUS | Status: DC
  Filled 2019-10-22: qty 25

## 2019-10-22 MED ORDER — SODIUM CHLORIDE 0.9 % IV SOLN
INTRAVENOUS | Status: DC | PRN
  Administered 2019-10-23 – 2019-10-25 (×2): 1000 mL via INTRAVENOUS

## 2019-10-22 MED ORDER — NITROGLYCERIN IN D5W 200-5 MCG/ML-% IV SOLN
2.0000 ug/min | INTRAVENOUS | Status: DC
  Filled 2019-10-22: qty 250

## 2019-10-22 MED ORDER — METHYLPREDNISOLONE SODIUM SUCC 125 MG IJ SOLR
125.0000 mg | Freq: Once | INTRAMUSCULAR | Status: AC
  Administered 2019-10-22: 125 mg via INTRAVENOUS

## 2019-10-22 MED ORDER — COAGULATION FACTOR VIIA RECOMB 1 MG IV SOLR
90.0000 ug/kg | Freq: Once | INTRAVENOUS | Status: AC
  Administered 2019-10-22: 6000 ug via INTRAVENOUS
  Filled 2019-10-22: qty 5

## 2019-10-22 MED ORDER — ROCURONIUM BROMIDE 10 MG/ML (PF) SYRINGE
PREFILLED_SYRINGE | INTRAVENOUS | Status: AC
  Filled 2019-10-22: qty 10

## 2019-10-22 MED ORDER — PROPOFOL 10 MG/ML IV BOLUS
INTRAVENOUS | Status: AC
  Filled 2019-10-22: qty 20

## 2019-10-22 MED ORDER — SODIUM CHLORIDE 0.9 % IV SOLN: INTRAVENOUS | Status: DC

## 2019-10-22 MED ORDER — PLASMA-LYTE 148 IV SOLN
INTRAVENOUS | Status: DC
  Filled 2019-10-22: qty 2.5

## 2019-10-22 MED ORDER — CHLORHEXIDINE GLUCONATE CLOTH 2 % EX PADS
6.0000 | MEDICATED_PAD | Freq: Every day | CUTANEOUS | Status: DC
  Administered 2019-10-23 – 2019-10-24 (×2): 6 via TOPICAL

## 2019-10-22 MED ORDER — TRANEXAMIC ACID (OHS) BOLUS VIA INFUSION
15.0000 mg/kg | INTRAVENOUS | Status: DC
  Filled 2019-10-22: qty 1065

## 2019-10-22 MED ORDER — INSULIN REGULAR(HUMAN) IN NACL 100-0.9 UT/100ML-% IV SOLN: INTRAVENOUS | Status: DC

## 2019-10-22 MED ORDER — CALCIUM CHLORIDE 10 % IV SOLN
INTRAVENOUS | Status: AC
  Administered 2019-10-22: 1000 mg
  Filled 2019-10-22: qty 10

## 2019-10-22 MED ORDER — TRANEXAMIC ACID (OHS) PUMP PRIME SOLUTION
2.0000 mg/kg | INTRAVENOUS | Status: DC
  Filled 2019-10-22: qty 1.42

## 2019-10-22 MED ORDER — MILRINONE LACTATE IN DEXTROSE 20-5 MG/100ML-% IV SOLN
0.5000 ug/kg/min | INTRAVENOUS | Status: DC
  Administered 2019-10-22 – 2019-10-27 (×14): 0.5 ug/kg/min via INTRAVENOUS
  Filled 2019-10-22 (×14): qty 100

## 2019-10-22 MED ORDER — MANNITOL 20 % IV SOLN
Freq: Once | INTRAVENOUS | Status: DC
  Filled 2019-10-22: qty 13

## 2019-10-22 MED ORDER — THROMBIN 20000 UNITS EX SOLR: OROMUCOSAL | Status: DC | PRN

## 2019-10-22 MED ORDER — EPINEPHRINE PF 1 MG/ML IJ SOLN: 0.0000 ug/min | INTRAVENOUS | Status: DC

## 2019-10-22 MED ORDER — MILRINONE LACTATE IN DEXTROSE 20-5 MG/100ML-% IV SOLN
0.3000 ug/kg/min | INTRAVENOUS | Status: DC
  Filled 2019-10-22: qty 100

## 2019-10-22 MED ORDER — MILRINONE LACTATE IN DEXTROSE 20-5 MG/100ML-% IV SOLN
INTRAVENOUS | Status: DC | PRN
  Administered 2019-10-22: .5 ug/kg/min via INTRAVENOUS

## 2019-10-22 MED ORDER — LIDOCAINE 2% (20 MG/ML) 5 ML SYRINGE
INTRAMUSCULAR | Status: AC
  Filled 2019-10-22: qty 5

## 2019-10-22 MED ORDER — CHLORHEXIDINE GLUCONATE CLOTH 2 % EX PADS: 6.0000 | MEDICATED_PAD | Freq: Every day | CUTANEOUS | Status: DC

## 2019-10-22 MED ORDER — ALBUMIN HUMAN 5 % IV SOLN: INTRAVENOUS | Status: DC | PRN

## 2019-10-22 MED ORDER — PANTOPRAZOLE SODIUM 40 MG IV SOLR
40.0000 mg | Freq: Every day | INTRAVENOUS | Status: DC
  Administered 2019-10-23 – 2019-11-07 (×17): 40 mg via INTRAVENOUS
  Filled 2019-10-22 (×17): qty 40

## 2019-10-22 MED ORDER — CALCIUM CHLORIDE 10 % IV SOLN
INTRAVENOUS | Status: AC
  Filled 2019-10-22: qty 20

## 2019-10-22 MED ORDER — PROPOFOL 10 MG/ML IV BOLUS
INTRAVENOUS | Status: DC | PRN
  Administered 2019-10-22: 50 mg via INTRAVENOUS

## 2019-10-22 MED ORDER — THROMBIN 20000 UNITS EX SOLR
OROMUCOSAL | Status: DC | PRN
  Administered 2019-10-22 (×2): 4 mL via TOPICAL

## 2019-10-22 MED ORDER — LEVOFLOXACIN IN D5W 500 MG/100ML IV SOLN
500.0000 mg | INTRAVENOUS | Status: DC
  Filled 2019-10-22: qty 100

## 2019-10-22 MED ORDER — DEXTROSE 50 % IV SOLN
0.0000 mL | INTRAVENOUS | Status: DC | PRN
  Administered 2019-10-23 – 2019-10-24 (×3): 25 mL via INTRAVENOUS
  Filled 2019-10-22 (×3): qty 50

## 2019-10-22 MED ORDER — THROMBIN 20000 UNITS EX KIT
PACK | CUTANEOUS | Status: DC | PRN
  Administered 2019-10-22: 20000 [IU] via TOPICAL

## 2019-10-22 MED ORDER — SODIUM CHLORIDE 0.9 % IV SOLN
INTRAVENOUS | Status: DC
  Filled 2019-10-22: qty 30

## 2019-10-22 MED ORDER — ALBUMIN HUMAN 5 % IV SOLN
INTRAVENOUS | Status: AC
  Administered 2019-10-22: 12.5 g via INTRAVENOUS
  Filled 2019-10-22: qty 500

## 2019-10-22 MED ORDER — METHYLPREDNISOLONE SODIUM SUCC 125 MG IJ SOLR
INTRAMUSCULAR | Status: AC
  Filled 2019-10-22: qty 2

## 2019-10-22 MED ORDER — NOREPINEPHRINE 4 MG/250ML-% IV SOLN
0.0000 ug/min | INTRAVENOUS | Status: DC
  Filled 2019-10-22: qty 250

## 2019-10-22 MED ORDER — SODIUM CHLORIDE 0.9 % IV SOLN
20.0000 ug | Freq: Once | INTRAVENOUS | Status: AC
  Administered 2019-10-22: 20 ug via INTRAVENOUS
  Filled 2019-10-22 (×2): qty 5

## 2019-10-22 MED ORDER — VASOPRESSIN 20 UNIT/ML IV SOLN
INTRAVENOUS | Status: AC
  Filled 2019-10-22: qty 1

## 2019-10-22 MED ORDER — DEXMEDETOMIDINE HCL IN NACL 400 MCG/100ML IV SOLN
0.1000 ug/kg/h | INTRAVENOUS | Status: DC
  Filled 2019-10-22 (×2): qty 100

## 2019-10-22 MED ORDER — VANCOMYCIN HCL 1250 MG/250ML IV SOLN
1250.0000 mg | INTRAVENOUS | Status: AC
  Administered 2019-10-22: 1250 mg via INTRAVENOUS
  Filled 2019-10-22: qty 250

## 2019-10-22 MED ORDER — ALBUMIN HUMAN 5 % IV SOLN
INTRAVENOUS | Status: AC
  Filled 2019-10-22: qty 250

## 2019-10-22 MED ORDER — SODIUM BICARBONATE 8.4 % IV SOLN
INTRAVENOUS | Status: AC
  Filled 2019-10-22: qty 200

## 2019-10-22 MED ORDER — ROCURONIUM BROMIDE 10 MG/ML (PF) SYRINGE
PREFILLED_SYRINGE | INTRAVENOUS | Status: DC | PRN
  Administered 2019-10-22: 100 mg via INTRAVENOUS

## 2019-10-22 MED FILL — Thrombin (Recombinant) For Soln 20000 Unit: CUTANEOUS | Qty: 1 | Status: AC

## 2019-10-22 MED FILL — Potassium Chloride Inj 2 mEq/ML: INTRAVENOUS | Qty: 40 | Status: AC

## 2019-10-22 MED FILL — Heparin Sodium (Porcine) Inj 1000 Unit/ML: INTRAMUSCULAR | Qty: 30 | Status: AC

## 2019-10-22 MED FILL — Lidocaine HCl Local Preservative Free (PF) Inj 2%: INTRAMUSCULAR | Qty: 15 | Status: AC

## 2019-10-22 SURGICAL SUPPLY — 97 items
BAG DECANTER FOR FLEXI CONT (MISCELLANEOUS) IMPLANT
BANDAGE ESMARK 6X9 LF (GAUZE/BANDAGES/DRESSINGS) ×2 IMPLANT
BLADE CLIPPER SURG (BLADE) IMPLANT
BLADE SURG 11 STRL SS (BLADE) ×3 IMPLANT
BNDG CMPR 9X4 STRL LF SNTH (GAUZE/BANDAGES/DRESSINGS) ×2
BNDG CMPR 9X6 STRL LF SNTH (GAUZE/BANDAGES/DRESSINGS) ×2
BNDG ESMARK 4X9 LF (GAUZE/BANDAGES/DRESSINGS) ×3 IMPLANT
BNDG ESMARK 6X9 LF (GAUZE/BANDAGES/DRESSINGS) ×3
CANISTER SUCT 3000ML PPV (MISCELLANEOUS) ×3 IMPLANT
CANISTER WOUNDNEG PRESSURE 500 (CANNISTER) ×3 IMPLANT
CATH EMB 6FR 80CM (CATHETERS) ×3 IMPLANT
CATH THORACIC 36FR RT ANG (CATHETERS) ×6 IMPLANT
CLEANER TIP ELECTROSURG 2X2 (MISCELLANEOUS) ×3 IMPLANT
CLIP FOGARTY SPRING 6M (CLIP) IMPLANT
CLIP VESOCCLUDE MED 24/CT (CLIP) ×3 IMPLANT
CLIP VESOCCLUDE SM WIDE 24/CT (CLIP) ×3 IMPLANT
CONN ST 1/4X3/8  BEN (MISCELLANEOUS) ×3
CONN ST 1/4X3/8 BEN (MISCELLANEOUS) ×2 IMPLANT
CONN Y 3/8X3/8X3/8  BEN (MISCELLANEOUS) ×3
CONN Y 3/8X3/8X3/8 BEN (MISCELLANEOUS) ×2 IMPLANT
DRAIN CHANNEL 32F RND 10.7 FF (WOUND CARE) IMPLANT
DRAPE CARDIOVASCULAR INCISE (DRAPES)
DRAPE CV SPLIT W-CLR ANES SCRN (DRAPES) ×3 IMPLANT
DRAPE INCISE IOBAN 66X45 STRL (DRAPES) ×3 IMPLANT
DRAPE PERI GROIN 82X75IN TIB (DRAPES) ×3 IMPLANT
DRAPE SRG 135X102X78XABS (DRAPES) IMPLANT
DRSG AQUACEL AG ADV 3.5X14 (GAUZE/BANDAGES/DRESSINGS) IMPLANT
ELECT REM PT RETURN 9FT ADLT (ELECTROSURGICAL) ×6
ELECT SOLID GEL RDN PRO-PADZ (MISCELLANEOUS) ×3
ELECTRODE REM PT RTRN 9FT ADLT (ELECTROSURGICAL) ×4 IMPLANT
ELECTRODE SOLI GEL RDN PROPADZ (MISCELLANEOUS) ×2 IMPLANT
FELT TEFLON 1X6 (MISCELLANEOUS) ×3 IMPLANT
GAUZE SPONGE 4X4 12PLY STRL (GAUZE/BANDAGES/DRESSINGS) IMPLANT
GLOVE BIO SURGEON STRL SZ 6.5 (GLOVE) ×6 IMPLANT
GLOVE BIOGEL PI IND STRL 6 (GLOVE) ×2 IMPLANT
GLOVE BIOGEL PI IND STRL 6.5 (GLOVE) ×10 IMPLANT
GLOVE BIOGEL PI INDICATOR 6 (GLOVE) ×1
GLOVE BIOGEL PI INDICATOR 6.5 (GLOVE) ×5
GLOVE ORTHO TXT STRL SZ7.5 (GLOVE) ×3 IMPLANT
GLOVE SURG SS PI 7.5 STRL IVOR (GLOVE) ×3 IMPLANT
GOWN STRL REUS W/ TWL LRG LVL3 (GOWN DISPOSABLE) ×8 IMPLANT
GOWN STRL REUS W/TWL LRG LVL3 (GOWN DISPOSABLE) ×12
HEMOSTAT POWDER SURGIFOAM 1G (HEMOSTASIS) ×12 IMPLANT
INSERT FOGARTY XLG (MISCELLANEOUS) IMPLANT
IV CATH 22GX1 FEP (IV SOLUTION) ×3 IMPLANT
KIT BASIN OR (CUSTOM PROCEDURE TRAY) ×3 IMPLANT
KIT SUCTION CATH 14FR (SUCTIONS) ×9 IMPLANT
KIT TURNOVER KIT B (KITS) ×3 IMPLANT
NS IRRIG 1000ML POUR BTL (IV SOLUTION) ×9 IMPLANT
PACK CHEST (CUSTOM PROCEDURE TRAY) ×3 IMPLANT
PACK E OPEN HEART (SUTURE) IMPLANT
PAD ARMBOARD 7.5X6 YLW CONV (MISCELLANEOUS) ×6 IMPLANT
PAD ELECT DEFIB RADIOL ZOLL (MISCELLANEOUS) ×3 IMPLANT
POSITIONER HEAD DONUT 9IN (MISCELLANEOUS) ×3 IMPLANT
SPONGE ABD ABTHERA ADVANCE (MISCELLANEOUS) ×3 IMPLANT
SPONGE LAP 18X18 RF (DISPOSABLE) ×9 IMPLANT
SPONGE LAP 4X18 RFD (DISPOSABLE) ×9 IMPLANT
STOPCOCK 4 WAY LG BORE MALE ST (IV SETS) IMPLANT
SUT ETHIBOND 2 0 SH (SUTURE)
SUT ETHIBOND 2 0 SH 36X2 (SUTURE) IMPLANT
SUT ETHIBOND X763 2 0 SH 1 (SUTURE) ×3 IMPLANT
SUT MNCRL AB 4-0 PS2 18 (SUTURE) IMPLANT
SUT PDS AB 1 CTX 36 (SUTURE) ×6 IMPLANT
SUT PROLENE 2 0 MH 48 (SUTURE) ×15 IMPLANT
SUT PROLENE 3 0 SH 1 (SUTURE) IMPLANT
SUT PROLENE 3 0 SH1 36 (SUTURE) ×6 IMPLANT
SUT PROLENE 4 0 RB 1 (SUTURE) ×6
SUT PROLENE 4 0 SH DA (SUTURE) ×3 IMPLANT
SUT PROLENE 4-0 RB1 .5 CRCL 36 (SUTURE) ×4 IMPLANT
SUT PROLENE 5 0 C 1 36 (SUTURE) IMPLANT
SUT PROLENE 6 0 C 1 30 (SUTURE) IMPLANT
SUT PROLENE 7 0 BV 1 (SUTURE) IMPLANT
SUT PROLENE 7 0 BV1 MDA (SUTURE) IMPLANT
SUT PROLENE 8 0 BV175 6 (SUTURE) IMPLANT
SUT SILK  1 MH (SUTURE) ×12
SUT SILK 1 MH (SUTURE) ×8 IMPLANT
SUT SILK 1 TIES 10X30 (SUTURE) ×3 IMPLANT
SUT SILK 2 0 SH CR/8 (SUTURE) ×9 IMPLANT
SUT SILK 2 0 TIES 10X30 (SUTURE) ×3 IMPLANT
SUT STEEL 6MS V (SUTURE) IMPLANT
SUT STEEL STERNAL CCS#1 18IN (SUTURE) IMPLANT
SUT STEEL SZ 6 DBL 3X14 BALL (SUTURE) IMPLANT
SUT VIC AB 2-0 CT1 36 (SUTURE) IMPLANT
SUT VIC AB 2-0 CTX 27 (SUTURE) IMPLANT
SUT VIC AB 3-0 SH 27 (SUTURE)
SUT VIC AB 3-0 SH 27X BRD (SUTURE) IMPLANT
SUT VICRYL 4-0 PS2 18IN ABS (SUTURE) IMPLANT
SYR 20ML LL LF (SYRINGE) ×3 IMPLANT
SYR 3ML LL SCALE MARK (SYRINGE) ×3 IMPLANT
SYR 50ML SLIP (SYRINGE) ×3 IMPLANT
SYSTEM SAHARA CHEST DRAIN ATS (WOUND CARE) ×3 IMPLANT
TOWEL GREEN STERILE (TOWEL DISPOSABLE) ×3 IMPLANT
TOWEL GREEN STERILE FF (TOWEL DISPOSABLE) ×3 IMPLANT
TOWEL OR NON WOVEN STRL DISP B (DISPOSABLE) ×3 IMPLANT
TRAY CATH LUMEN 1 20CM STRL (SET/KITS/TRAYS/PACK) IMPLANT
UNDERPAD 30X30 (UNDERPADS AND DIAPERS) ×3 IMPLANT
WATER STERILE IRR 1000ML POUR (IV SOLUTION) ×6 IMPLANT

## 2019-10-22 NOTE — Transfer of Care (Signed)
Immediate Anesthesia Transfer of Care Note  Patient: CATILYN BOGGUS  Procedure(s) Performed: Re-exploration of post-operative open heart procedure for bleeding (N/A Chest) Cannulation For Ecmo (Extracorporeal Membrane Oxygenation) (N/A Chest)  Patient Location: ICU  Anesthesia Type:General  Level of Consciousness: Patient remains intubated per anesthesia plan  Airway & Oxygen Therapy: Patient remains intubated per anesthesia plan and Patient placed on Ventilator (see vital sign flow sheet for setting)  Post-op Assessment: Report given to RN and Post -op Vital signs reviewed and stable  Post vital signs: Reviewed and stable  Last Vitals:  Vitals Value Taken Time  BP MAP 60 10/15/2019 0430  Temp 36.1 C 11/12/2019 0435  Pulse 39 11/04/2019 0427  Resp 18 10/19/2019 0435  SpO2 100 % 11/09/2019 0427  Vitals shown include unvalidated device data.  Last Pain:  Vitals:   10/16/2019 1750  TempSrc: Axillary     Report to Gwyndolyn Saxon RN in ICU, Roxy Manns MD and Nyoka Cowden MD at bedside, patient currently on insulin, precedex and milrinone, currently on ECMO therapy, specialists at bedside, RT at bedside, applied to ventilator, PRVC mode, 100% FiO2 w/ Nitric Oxide therapy, coagulation labs sent before departing OR, questions answered.    Complications: No apparent anesthesia complications

## 2019-10-22 NOTE — Brief Op Note (Addendum)
11/08/2019 - 10/31/2019  3:42 PM  PATIENT:  [name]  49 y.o. female  PRE-OPERATIVE DIAGNOSIS:  BLEEDING POST TRICUSPID VALVE REPAIR AND MEDIAN STERNOTOMY WITH CANNULATION FOR ECMO  POST-OPERATIVE DIAGNOSIS: BLEEDING POST TRICUSPID VALVE REPAIR AND MEDIAN STERNOTOMY WITH CANNULATION FOR ECMO  PROCEDURE:  MEDIASTINAL EXPLORATION, CONTROL OF BLEEDING  SURGEON:  Rexene Alberts, MD - Primary  PHYSICIAN ASSISTANT: Roddenberry  ANESTHESIA:   general  EBL:  400 mL   BLOOD ADMINISTERED:multiple units PRBC's and cryoprecipitate. See anesthesia record  DRAINS: Mediastinal drains x 4, right pleural tubes x 2. Rt atrial and ascending aortic ECMO cannulae.  LOCAL MEDICATIONS USED:  NONE  SPECIMEN:  No Specimen  DISPOSITION OF SPECIMEN:  N/A  COUNTS:  YES  DICTATION: .Dragon Dictation  PLAN OF CARE: Admit to inpatient   PATIENT DISPOSITION:  ICU - intubated and hemodynamically stable.   Delay start of Pharmacological VTE agent (>24hrs) due to surgical blood loss or risk of bleeding: yes

## 2019-10-22 NOTE — Op Note (Signed)
CARDIOTHORACIC SURGERY OPERATIVE NOTE  Date of Procedure:   11/02/2019  Preoperative Diagnosis:    Bleeding s/p Minimally-Invasive Tricuspid Valve Repair  Postoperative Diagnosis:    Bleeding s/p Minimally-Invasive Tricuspid Valve Repair  S/P Ventricular Fibrillation Cardiac Arrest  Massive Coagulopathy  Acute Hypoxic Respiratory Failure  Acute on Chronic Right Ventricular Failure  Procedure:      Reexploration of Right Chest for Bleeding  Median Sternotomy for Resuscitation Following Cardiac Arrest  Cannulation for Extracorporeal Membrane Oxygenator Support  Surgeon:    Valentina Gu. Roxy Manns, MD  Assistant:    B. Murvin Natal, MD and Ivin Poot, MD  Anesthesia:    Belinda Block, MD and Roberts Gaudy, MD  Operative Findings:   Patient suffered sudden ventricular fibrillation cardiac arrest at beginning of procedure   Prolonged period of closed chest followed by emergency sternotomy for open chest cardiac massage  Severe coagulopathy refractory to massive resuscitation with blood products  Acute hypoxic respiratory failure and acute on chronic right ventricular failure requiring initiation of ECMO support      BRIEF CLINICAL NOTE AND INDICATIONS FOR SURGERY  Patient is a 49 year old African-American female with complex past medical history notable for history of longstanding lupus, hypertension, lupus nephritis, pulmonary hypertension, severe tricuspid regurgitation, 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  Intraoperatively the patient was noted to have severe thrombocytopenia and coagulopathy that was treated with administration of platelets and fresh frozen plasma.  During the initial 3 hours after return from the operating room to the intensive care unit patient remained hemodynamically stable but had more  than 600 mL of chest tube output and portable chest x-ray suggested early signs of possible developing hemothorax.  Plans were made to return directly to the operating room for reexploration of the right chest.  The patient's husband was appraised of the circumstances at the patient's bedside.     DETAILS OF THE OPERATIVE PROCEDURE  The patient is brought to the operating room on the above mentioned date and placed in the supine position on the operating table.  General endotracheal anesthesia is monitored initially under the care and direction of Dr. Belinda Block.  The patient's right chest, abdomen, and both groins are prepared and draped in a sterile manner.  The patient's previous right minithoracotomy incision is reopened.  While this is being performed the patient somewhat suddenly developed hypotension which rapidly progressed and the patient suffered ventricular fibrillation cardiac arrest.  The patient was promptly cardioverted a total of three times without success.  Closed chest CPR is commenced.  The thoracotomy incision is extended over considerable distance to facilitate exploration of the mediastinum.  There is a large amount of blood in the right pleural space but there does not appear to be significant amount of blood in the mediastinum.  Emergency median sternotomy was performed.  An unusually large amount of hemorrhage is encountered during sternotomy because of extremely engorged vessels surrounding the sternum.  A sternal retractor is placed and direct manual cardiac massage is performed.  During this time resuscitation efforts are continued aggressively by the anesthesia team.  After multiple rounds of intravenous epinephrine and sodium bicarbonate additional attempts to cardiovert the patient remained unsuccessful.  Aggressive volume resuscitation is continued as well, and ultimately the patient successfully cardioverted out of VF but the patient remains profoundly hypotensive and  the right ventricle and  right atrium are massively enlarged..  By this time open chest massage has resulted in severe bleeding from the patient's right atriotomy incision as well as from the surface of the right atrium where two pacing wires had been secured.  A decision is made to proceed with emergency institution of cardiopulmonary bypass.  The patient is heparinized systemically.  The ascending aorta and the right atrium are directly cannulated for cardiopulmonary bypass.  Cardiopulmonary bypass is initiated.  Once the heart has been decompressed the right atriotomy incision and the locations from previous pacing wires are repaired with multiple pledgeted Prolene sutures.  While the patient remains on cardiopulmonary bypass and the heart is stable, attention is now dressed to attempt to control mechanical bleeding from the emergent sternotomy and extended thoracotomy incision.  An additional counter incision is made in the chest through the sixth intercostal space to facilitate exposure of the inferior right hemothorax to make sure sites of previous pleural adhesions were examined for possible bleeding.  There is an unusual amount of bleeding from multiple sources.  At this time it is also noted that the patient's airway pressures are elevated providing a clue that she is developing severe acute lung injury and pulmonary edema related to massive resuscitation.    An attempt to wean the patient from cardiopulmonary bypass is notable for the presence of significant right ventricular dysfunction resulting in high central venous pressures and severe right ventricular chamber enlargement.  Airway pressures are moderate to severely elevated.  A decision is made to mobilize the ECMO support team.  The patient is transiently partially weaned from cardiopulmonary bypass to facilitate disconnecting from the cardiopulmonary bypass circuit and initiation of ECMO support.  On the initial try there are low flow alarms  on the ECMO circuit and conventional cardiopulmonary bypass is resumed.  The patient remains stable following second attempt at conversion to ECMO support.  Total cardiopulmonary bypass time for the procedure at initiation of ECMO was 96 minutes.  Protamine is administered to reverse the anticoagulation.  Massive coagulopathy persists.  An exhaustive search for surgically correctable sites of mechanical bleeding is performed.  There remains severe bleeding.  The patient is resuscitated with multiple blood products after which time recombinant human factor VII is administered.  During this time the patient's chest is packed on multiple occasions.  Ultimately the coagulopathy seems to improve although clearly remains persistent.  Decision is made to pack the patient's chest to facilitate transport to the intensive care unit for further resuscitation.  The right pleural space is drained using a pair of 36 French chest tubes placed through separate stab incisions inferiorly.  The small counterincision is closed in multiple layers and the larger thoracotomy incision is closed in multiple layers.  Both skin incisions are closed with interrupted vertical mattress Prolene suture.  The mediastinum is again carefully examined and reexamined over several hours with intermittent packing to facilitate hemostasis.  Ultimately the aortic and venous cannulas are secured to the skin and the mediastinum was packed with laparotomy pads.  The mediastinum was covered with Esmarch and a wound VAC is applied over the Esmark dressing.  The patient is transported directly to the surgical intensive care unit in critical condition on full ECMO support.  Total amounts of blood products administered during the operation include 20 units packed red blood cells, 6 packs of platelets, 8 units cryoprecipitate, and 14 units fresh frozen plasma.    Valentina Gu. Roxy Manns MD 11/06/2019 4:36 AM

## 2019-10-22 NOTE — Progress Notes (Signed)
Late Entry  Four units PRBC transfused emergently using rapid infuser.  [place]

## 2019-10-22 NOTE — Progress Notes (Signed)
Patient ID: [name], female   DOB: 03-29-1971, 50 y.o.   MRN: [id number] EVENING ROUNDS NOTE :     [place].[address]       ,[place] [address]             [phone]                 Day of Surgery Procedure(s) (LRB): EXPLORATION POST OPERATIVE OPEN HEART (N/A) Transesophageal Echocardiogram (Tee) (N/A) Application Of Wound Vac (N/A)  Total Length of Stay:  LOS: 1 day  BP 118/67   Pulse 87   Temp (!) 97.5 F (36.4 C)   Resp 12   Ht 5\' 7"  (1.702 m)   Wt 71 kg   LMP 04/02/2017   SpO2 100%   BMI 24.52 kg/m   .Intake/Output      04/07 0701 - 04/08 0700 04/08 0701 - 04/09 0700   I.V. (mL/kg) 19006.4 (267.7) 272 (3.8)   Blood 18529.7 7193.1   IV Piggyback 868.6 316   Total Intake(mL/kg) 38404.7 (540.9) 7781 (109.6)   Urine (mL/kg/hr) 2555 (1.5) 450 (0.7)   Blood 13100 400   Chest Tube 5030 5520   Total Output 20685 6370   Net +17719.7 +1411          . sodium chloride    . sodium chloride    . clevidipine 2 mg/hr (11/11/2019 0410)  . dexmedetomidine (PRECEDEX) IV infusion 0.8 mcg/kg/hr (11/09/2019 1200)  . dextrose 5 % and 0.45% NaCl    . epinephrine Stopped (11/09/2019 0505)  . famotidine (PEPCID) IV 20 mg (10/15/2019 1127)  . insulin 1.7 Units/hr (11/11/2019 1417)  . lactated ringers    . lactated ringers    . lactated ringers 100 mL/hr at 10/15/2019 0558  . levofloxacin (LEVAQUIN) IV    . milrinone 0.5 mcg/kg/min (10/20/2019 1308)  . nitroGLYCERIN    . norepinephrine (LEVOPHED) Adult infusion    . phenylephrine (NEO-SYNEPHRINE) Adult infusion Stopped (10/15/2019 1400)  . vancomycin       Lab Results  Component Value Date   WBC 1.0 (LL) 10/27/2019   HGB 3.9 (LL) 10/30/2019   HCT 11.7 (L) 11/02/2019   PLT 170 11/13/2019   GLUCOSE 181 (H) 10/29/2019   ALT 13 10/31/2019   AST 28 11/13/2019   NA 153 (H) 10/29/2019   K 4.1 11/06/2019   CL 112 (H) 11/03/2019   CREATININE 1.42 (H) 11/02/2019   BUN 14 10/31/2019   CO2 28 10/23/2019   INR 1.5 (H)  10/20/2019   HGBA1C 7.3 (H) 10/19/2019   Back from reexploration today after tricuspid repair  Oxygenation ok Flow 2.5 l Little or no uop plts 170,000   Grace Isaac MD  Beeper [phone] Office [phone] 11/08/2019 4:36 PM

## 2019-10-22 NOTE — Progress Notes (Signed)
Blood bank slip found on computer in room. Administered by night shift RN.  Platelet unit [id number]. Emergency released.

## 2019-10-22 NOTE — Progress Notes (Signed)
Advanced Heart Failure Rounding Note  PCP-Cardiologist: No primary care provider on file.   Subjective:    Was in OR all night with severe persistent chest bleeding despite all efforts  Now on ECMO. CTs continue to drain over 500cc/hour.   Has received RBCS, PLTs, TXA, cryo, Factor 7, steroids and unable to stem coagulopathy.   Sedated on vent. Ecmo flows ok.  Hgb 4.7 this am. PLTs 59k     Objective:   Weight Range: 71 kg Body mass index is 24.52 kg/m.   Vital Signs:   Temp:  [96.8 F (36 C)-99 F (37.2 C)] 97.5 F (36.4 C) (04/08 1300) Pulse Rate:  [28-89] 80 (04/08 1300) Resp:  [0-19] 0 (04/08 1200) BP: (37-144)/(18-108) 118/94 (04/08 1300) SpO2:  [99 %-100 %] 100 % (04/08 1300) Arterial Line BP: (63-228)/(58-220) 125/101 (04/08 1300) FiO2 (%):  [40 %-100 %] 40 % (04/08 1135)    Weight change: Filed Weights   11/05/2019 0700  Weight: 71 kg    Intake/Output:   Intake/Output Summary (Last 24 hours) at 10/25/2019 1503 Last data filed at 10/17/2019 1440 Gross per 24 hour  Intake 43434.75 ml  Output 25990 ml  Net 17444.75 ml      Physical Exam    General:  Critically ill appearing on vent and ecmo  HEENT: Normal _ ETT Neck: Supple. TIJ swan. Carotids 2+ bilat; no bruits. No lymphadenopathy or thyromegaly appreciated. Cor: Chest open with ecmo cannulas. Diffuse bleeding from all sites  Lungs: coarse Abdomen: obese Soft, nontender, + distended. No hepatosplenomegaly. No bruits or masses. hypoactive bowel sounds. Extremities: No cyanosis, clubbing, rash, 2+ edema Neuro: intubated sedatd   Telemetry   Apaced 80s Personally reviewed   Labs    CBC Recent Labs    10/24/2019 1749 10/19/2019 2228 11/11/2019 0636 10/20/2019 1107  WBC 3.0*   < > 0.6* 1.0*  NEUTROABS 2.5  --   --   --   HGB 7.7*   < > 4.7* 3.9*  HCT 23.3*   < > 14.1* 11.7*  MCV 86.6   < > 89.2 88.0  PLT 77*   < > 59* 170   < > = values in this interval not displayed.   Basic Metabolic  Panel Recent Labs    10/30/2019 0334 10/15/2019 0334 11/03/2019 0636 11/07/2019 1107  NA 149*   < > 148* 153*  K 4.5   < > 3.9 4.1  CL 118*   < > 121* 112*  CO2 17*   < > 17* 28  GLUCOSE 168*   < > 141* 181*  BUN 13   < > 12 14  CREATININE 1.16*   < > 0.93 1.42*  CALCIUM 5.6*   < > 4.3* 6.5*  MG 1.3*  --   --   --    < > = values in this interval not displayed.   Liver Function Tests Recent Labs    10/19/2019 0636 10/17/2019 1107  AST 43* 28  ALT 11 13  ALKPHOS 12* 25*  BILITOT 1.2 0.7  PROT <3.0* 3.1*  ALBUMIN 1.9* 1.9*   No results for input(s): LIPASE, AMYLASE in the last 72 hours. Cardiac Enzymes No results for input(s): CKTOTAL, CKMB, CKMBINDEX, TROPONINI in the last 72 hours.  BNP: BNP (last 3 results) Recent Labs    04/15/19 1101  BNP 248.7*    ProBNP (last 3 results) No results for input(s): PROBNP in the last 8760 hours.   D-Dimer No results  for input(s): DDIMER in the last 72 hours. Hemoglobin A1C No results for input(s): HGBA1C in the last 72 hours. Fasting Lipid Panel No results for input(s): CHOL, HDL, LDLCALC, TRIG, CHOLHDL, LDLDIRECT in the last 72 hours. Thyroid Function Tests No results for input(s): TSH, T4TOTAL, T3FREE, THYROIDAB in the last 72 hours.  Invalid input(s): FREET3  Other results:   Imaging    DG Chest 1V REPEAT Same Day  Result Date: 10/30/2019 CLINICAL DATA:  49 year old female with right-sided hemothorax. EXAM: CHEST - 1 VIEW SAME DAY COMPARISON:  Earlier radiograph dated 11/05/2019. FINDINGS: Interval decrease in aeration of the right lung and increase in diffuse opacity throughout the right lung since the earlier radiograph. Findings likely represent progression of atelectasis or increase in the right pleural effusion, although asymmetric edema or pneumonia is not excluded. Clinical correlation and follow-up recommended. The left lung remains clear. No pneumothorax. Support apparatus in similar position. Postsurgical changes of  cardiac valve repair. No acute osseous pathology. IMPRESSION: 1. Interval decrease in the aeration of the right lung and increase in the diffuse opacity throughout the right lung compared to the earlier radiograph. No pneumothorax. 2. Stable positioning of the support apparatus. Electronically Signed   By: Anner Crete M.D.   On: 11/02/2019 19:03   DG Chest Port 1 View  Result Date: 10/16/2019 CLINICAL DATA:  Atelectasis.  ECMO. EXAM: PORTABLE CHEST 1 VIEW COMPARISON:  Yesterday FINDINGS: The endotracheal tube tip is obscured but terminates near the clavicular heads. Likely open chest with ECMO catheters and packing in place. Chest tubes in place. Left IJ line with tip near the right brachiocephalic vein. No visible pneumothorax. Hazy bilateral pulmonary opacity. IMPRESSION: 1. Presumably open chest with new ECMO catheters. 2. Airspace opacity favoring edema. No visible pneumothorax or pleural fluid. Electronically Signed   By: Monte Fantasia M.D.   On: 11/13/2019 07:33   DG Chest Port 1 View  Result Date: 10/27/2019 CLINICAL DATA:  Status post minimally invasive tricuspid valve repair. EXAM: PORTABLE CHEST 1 VIEW COMPARISON:  Preoperative radiograph 10/19/2019 FINDINGS: Endotracheal tube tip 3.6 cm from the carina. Tip and side port of the enteric tube below the diaphragm in the stomach. Left internal jugular central venous catheter tip projects over the upper SVC, the sheath appears in place. Right chest tube with tip directed towards the apex. Presumed mediastinal drain. Multiple epicardial wires in place. Interval cardiac valve replacement. Upper normal heart size with unchanged mediastinal contours. Right pleural effusion tracking superolaterally. Patchy airspace opacities throughout the right hemithorax. No visualized pneumothorax. Small left pleural effusion tracking medially. No acute osseous abnormalities are seen. IMPRESSION: 1. Endotracheal tube 3.6 cm from the carina. Enteric tube tip and  side-port below the diaphragm. Left internal jugular central venous catheter tip projects over the upper SVC, sheath remains in place. 2. Cardiac valve replacement, tricuspid per history. Right chest tube and mediastinal drain in place. Right pleural effusion tracking superolaterally. Patchy airspace opacities throughout the right hemithorax likely atelectasis. 3. Small left pleural effusion tracking medially. Electronically Signed   By: Keith Rake M.D.   On: 11/03/2019 17:20      Medications:     Scheduled Medications: . [MAR Hold] sodium chloride   Intravenous Once  . [MAR Hold] sodium chloride   Intravenous Once  . [MAR Hold] sodium chloride   Intravenous Once  . [MAR Hold] sodium chloride   Intravenous Once  . [MAR Hold] chlorhexidine  15 mL Mouth/Throat NOW  . [MAR Hold] Chlorhexidine Gluconate Cloth  6 each Topical Daily  . [MAR Hold] Chlorhexidine Gluconate Cloth  6 each Topical Daily  . [MAR Hold] docusate sodium  200 mg Oral Daily  . epinephrine  0-10 mcg/min Intravenous To OR  . heparin-papaverine-plasmalyte irrigation   Irrigation To OR  . insulin   Intravenous To OR  . [place] Blood Cardioplegia vial (lidocaine/magnesium/mannitol 0.26g-4g-6.4g)   Intracoronary Once  . [MAR Hold] levothyroxine  50 mcg Oral Q0600  . [MAR Hold] pantoprazole (PROTONIX) IV  40 mg Intravenous QHS  . phenylephrine  30-200 mcg/min Intravenous To OR  . potassium chloride  80 mEq Other To OR  . sodium bicarbonate      . sodium bicarbonate      . [MAR Hold] sodium chloride flush  3 mL Intravenous Q12H  . tranexamic acid  15 mg/kg Intravenous To OR  . tranexamic acid  2 mg/kg Intracatheter To OR     Infusions: . sodium chloride    . [MAR Hold] sodium chloride    . [MAR Hold] clevidipine 2 mg/hr (10/29/2019 0410)  . dexmedetomidine (PRECEDEX) IV infusion 0.8 mcg/kg/hr (10/31/2019 1200)  . dexmedetomidine    . dextrose 5 % and 0.45% NaCl    . [MAR Hold] epinephrine Stopped (10/17/2019 0505)    . famotidine (PEPCID) IV 20 mg (10/18/2019 1127)  . heparin 30,000 units/NS 1000 mL solution for CELLSAVER    . insulin 1.7 Units/hr (11/12/2019 1417)  . [MAR Hold] lactated ringers    . lactated ringers    . lactated ringers 100 mL/hr at 10/25/2019 0558  . levofloxacin (LEVAQUIN) IV    . [MAR Hold] levofloxacin (LEVAQUIN) IV    . milrinone 0.5 mcg/kg/min (11/05/2019 1308)  . milrinone    . [MAR Hold] nitroGLYCERIN    . nitroGLYCERIN    . [MAR Hold] norepinephrine (LEVOPHED) Adult infusion    . norepinephrine    . [MAR Hold] phenylephrine (NEO-SYNEPHRINE) Adult infusion 20 mcg/min (11/13/2019 1349)  . tranexamic acid (CYKLOKAPRON) infusion ([facility])    . [MAR Hold] vancomycin       PRN Medications:  [MAR Hold] sodium chloride, [MAR Hold] dextrose, [MAR Hold] lactated ringers, [MAR Hold] metoprolol tartrate, [MAR Hold] midazolam, [MAR Hold]  morphine injection, [MAR Hold] ondansetron (ZOFRAN) IV, [MAR Hold] oxyCODONE, [MAR Hold] sodium chloride flush, sodium chloride irrigation, Surgifoam 1 Gm with 0.9% sodium chloride (4 ml) topical solution, Surgifoam 1 Gm with Thrombin 20,000 units (20 ml) topical solution, [MAR Hold] traMADol     Assessment/Plan   1. Post-cardiotomy hemorrhagic shock - Patient with refractory post-operative chest bleeding s/p minimally invasive TV repair on 10/30/2019 in setting of longstanding SLE - now on ECMO supoort - blood products have been replaced aggressively but continues to bleed - will continue to replace products and give Kcentra and DDAVP - I spoke with Dr. Beryle Beams in heme who agreed with approach. - D/w Dr. Roxy Manns. Return to OR for washout and repacking.  - see ECMO note  2. Severe TR - s/p mini-TV repair 4/7 - plan as above  3. Post-operative respiratory failure - remains intubated  - Progressive airspace disease in R lung after mini thoracotomy. Hopefully will improve with re-expansion  4. SLE - no change  CRITICAL CARE Performed by:  Glori Bickers  Total critical care time: 60 minutes  Critical care time was exclusive of separately billable procedures and treating other patients.  Critical care was necessary to treat or prevent imminent or life-threatening deterioration.  Critical care was time spent personally by me (  independent of midlevel providers or residents) on the following activities: development of treatment plan with patient and/or surrogate as well as nursing, discussions with consultants, evaluation of patient's response to treatment, examination of patient, obtaining history from patient or surrogate, ordering and performing treatments and interventions, ordering and review of laboratory studies, ordering and review of radiographic studies, pulse oximetry and re-evaluation of patient's condition.    Length of Stay: 1  Glori Bickers, MD  10/18/2019, 3:03 PM  Advanced Heart Failure Team Pager [phone] (M-F; 7a - 4p)  Please contact [facility] for night-coverage after hours (4p -7a ) and weekends on [email or web address]

## 2019-10-22 NOTE — Progress Notes (Signed)
   11/09/2019 0400  Clinical Encounter Type  Visited With Family  Visit Type Spiritual support;Patient in surgery;Post-op  Referral From Nurse;Physician  Consult/Referral To Chaplain  Spiritual Encounters  Spiritual Needs Prayer;Emotional  Stress Factors  Patient Stress Factors Health changes   Patient had long difficult surgery.  Husband here alone waiting initially and chaplain came to be with him and offer spiritual support and then his parents and parents of wife came and they all waited for her to come out of surgery and for doctor to give report.  IT was and is a difficult situation.  Family very thankful for opportunity to see patient very briefly and praying for a miracle.May God bless all the doctors and nurses and other staff who worked so hard and bless this patient and her family and we ask for a miracle to bring healing and recovery to her. Conard Novak, Chaplain

## 2019-10-22 NOTE — Progress Notes (Signed)
ECMO Specialist goal is to try to establish and maintain good ventilation, oxygenation, perfusion via utilization of the ECMO pump to meet systemic and myocardial oxygen demands. Also trying to restore volume to the patient. Patient pulsatility is compromised. Patient has had massive fluid/volume resuscitation with blood products.  MD Ricard Dillon is aware of patient clinical presentation and current status of therapies.   Yuya Vanwingerden L. Tamala Julian, BS, RRT, RCP

## 2019-10-22 NOTE — Progress Notes (Signed)
Emergency transfusion of multiple products given this morning. Unable to locate bag of plasma to scan after all emergency transfusions completed.  Unit [id number].

## 2019-10-22 NOTE — Progress Notes (Signed)
Dr. Roxy Manns aware of all critical results from recent labs.

## 2019-10-22 NOTE — CV Procedure (Signed)
ECMO NOTE:  Indication: Hemorrhagic shock post TV repai  Initial cannulation date: 10/17/2019  ECMO type: VVA ECMO (Cardio-Help)  Drainage: RA cannulation Return: Asc aorta   ECMO events:  Initial cannulation 10/31/2019 Back to OR for chest washout and repacking  Daily data:   Flow 3.22 RPM 2700 (limited by tamponade) dP 10 Pven -16 Sweep 2 SVO2 not picking up  Labs:   Hgb4.7 LDH 403 Heparin level: off Lactic acid 0.9 BUN 60  Plan:  Continues with refractory bleeding.  Continue to replace RBCs and factors.  To OR for washout and repacking.   Glori Bickers, MD  3:15 PM

## 2019-10-22 NOTE — Progress Notes (Signed)
  Echocardiogram Echocardiogram Transesophageal has been performed.  [name] M 10/20/2019, 3:02 PM

## 2019-10-22 NOTE — Progress Notes (Signed)
TCTS BRIEF SICU PROGRESS NOTE  1 Day Post-Op  S/P Procedure(s) (LRB): Re-exploration of post-operative open heart procedure for bleeding (N/A) Cannulation For Ecmo (Extracorporeal Membrane Oxygenation) (N/A)   Patient has continued to have high volume chest tube output despite further attempts to correct coagulopathy.  Hgb has dropped from 4.7 to 3.9 despite transfusion 4 units PRBC's.  Prognosis grim.  Options personally discussed w/ patient's husband and family including return to OR for reexploration of chest to r/o any mechanical sources of blood loss and continued resuscitation versus continuing current efforts (which are not working) versus withdrawal of support.    Plan: Return to OR for reexploration of mediastinum and resuscitation.  They understand the bleak outlook and the significant possibility that the patient may not survive to return from the OR.   Rexene Alberts, MD 10/31/2019 12:55 PM

## 2019-10-22 NOTE — Progress Notes (Addendum)
TCTS BRIEF SICU PROGRESS NOTE  1 Day Post-Op  S/P Procedure(s) (LRB): Re-exploration of post-operative open heart procedure for bleeding (N/A) Cannulation For Ecmo (Extracorporeal Membrane Oxygenation) (N/A)   Patient remains critically ill with high volume drainage from mediastinal chest tubes Esmark dressing intact but slightly distended, patient still bleeding from all incisions, IV's NSR w/ MAP 70 and ECMO flows 3.0-3.5 L/min CVP 18 Prothrombin time and aPTT prolonged, fibrinogen level low Follow up Hgb/Hct and platelet count pending Continue to attempt to correct coagulopathy with all means possible Empiric steroids due to likelihood of autoimmune antibody-mediated thrombocytopenia Consider return to OR if bleeding persists despite successful correction of coagulopathy  Rexene Alberts, MD 10/15/2019 7:30 AM

## 2019-10-22 NOTE — Anesthesia Postprocedure Evaluation (Signed)
Anesthesia Post Note  Patient: [name]  Procedure(s) Performed: Re-exploration of post-operative open heart procedure for bleeding (N/A Chest) Cannulation For Ecmo (Extracorporeal Membrane Oxygenation) (N/A Chest)     Patient location during evaluation: SICU Anesthesia Type: General Level of consciousness: patient remains intubated per anesthesia plan Pain management: pain level controlled Vital Signs Assessment: post-procedure vital signs reviewed and stable Respiratory status: patient remains intubated per anesthesia plan Cardiovascular status: stable Postop Assessment: no apparent nausea or vomiting Anesthetic complications: no    Last Vitals:  Vitals:   10/18/2019 1830 11/11/2019 1845  BP:    Pulse:    Resp: 12 12  Temp: 36.6 C 36.6 C  SpO2:  100%    Last Pain:  Vitals:   10/20/2019 1750  TempSrc: Axillary                 Keeara Frees

## 2019-10-22 NOTE — Progress Notes (Signed)
Patient was transported from OR17 to 2H03 on oxygen tank and battery power. Emergency medications and supplies were available at all times. Upon arrival to destination, circuit was connected to wall oxygen source and plugged into a red power outlet. Transport was uneventful.

## 2019-10-22 NOTE — CV Procedure (Signed)
ECMO INITIATION   Patient: [name], [birth year], 49 y.o. Location:   Date of Service:  10/30/2019     Time: 1:32 AM  Date of Admission: 11/06/2019 Admitting diagnosis: S/P tricuspid valve repair  Ht: 5\' 7"  (170.2 cm) Wt: 71 kg BSA: Body surface area is 1.83 meters squared.  Blood Type: A POS Allergies:  Allergies  Allergen Reactions  . Lisinopril     angioedema  . Penicillins Hives    DID THE REACTION INVOLVE: Swelling of the face/tongue/throat, SOB, or low BP? Unknown Sudden or severe rash/hives, skin peeling, or the inside of the mouth or nose? Unknown Did it require medical treatment? Unknown When did it last happen?age 49 If all above answers are "NO", may proceed with cephalosporin use.     Past medical history:  Past Medical History:  Diagnosis Date  . 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 Diabetes mellitus without complication ([place]) 30/0762  . Hypertension   . Hypothyroidism 01/2019  . Lupus ([place])   . Lupus nephritis ([place])   . Prediabetes 10/2011  . Pulmonary hypertension ([place])   . 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 Tricuspid regurgitation   . 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 bilat hip grafts     2007  . BUNIONECTOMY  2018  . HIP SURGERY     x2  . RIGHT HEART CATH N/A 01/02/2019   Procedure: RIGHT HEART CATH;  Surgeon: Jolaine Artist, MD;  Location: [facility];  Service: Cardiovascular;  Laterality: N/A;  . RIGHT/LEFT HEART CATH AND CORONARY ANGIOGRAPHY N/A 07/11/2018   Procedure: RIGHT/LEFT HEART CATH AND CORONARY ANGIOGRAPHY;  Surgeon: Jolaine Artist, MD;  Location: [facility];  Service: Cardiovascular;  Laterality: N/A;  .  SHOULDER SURGERY Bilateral 2015   Core displacement  . TEE WITHOUT CARDIOVERSION N/A 08/07/2019   Procedure: TRANSESOPHAGEAL ECHOCARDIOGRAM (TEE);  Surgeon: Jolaine Artist, MD;  Location: [facility] ENDOS[facility];  Service: Cardiovascular;  Laterality: N/A;  . TOTAL HIP ARTHROPLASTY  08/2016 and 10/2016   x2  . UMBILICAL HERNIA REPAIR      Indication for ECMO: Post-Cardiotomy  ECMO was deployed at 2115 and initiated at 2320  Anticoagulation achieved with [place]arin bol[place] 26[address] units given to patient at 2128 (for CPB). Cannulated for ECMO Mode: VA and achieved initial ECMO Flow (LPM): 3.94 and ECMO Sweep Gas (LPM): 10.    ECMO Cannula Information     Staff Present  Primary Perfusionist Mariam Dollar, [place]  Assisting Perfusionist/ECMO Specialist Anell Barr, RRT  Cannulating Physician Dr. Roxy Manns   ECMO Lot Numbers  CardioHelp Console  Cardiohelp #1  Oxygenator  [id number]  Tubing Pack  [id number]  ECMO Goals  Flow goal   Flow Goal: 3.5-4 LPM  Anticoagulation goal   Anticoagulation Goal: None- patient bleeding  Cardiac goal      Respiratory goal   Respiratory Goal: Normalize  Other goal       ECMO Handoff  Patient Information * Age Height Weight BSA IBW BMI  49 y.o. 5\' 7"  (170.2 cm)  (71 kg Body surface area is 1.83 meters squared. No data recorded Body mass index is 24.52 kg/m.   Review History * Primary Diagnosis   S/P tricuspid valve repair  Prior  Cardiac Arrest within 24hrs of ECMO initiation? Yes  ECMO and MCS * Type ECMO Flow ECMO Sweep Gases   ECMO Device: Cardiohelp   Flow (LPM): 3.94   Sweep Gas (LPM): 10     Additional Mechanical Support   Ventilation *    Vent Mode: SIMV;PSV;PRVC, Vt Set: 490 mL, Set Rate: 12 bmp, FiO2 (%): 50 %, I Time: 0.9 Sec(s), PEEP: 5 cmH20     Cannula Size and Locations       Drainage  36/46 Right Atrium  Return 21 Fr. Aorta    *Cannula(e) sutured and anchored, secured and dressed.   Labs and Imaging *  *Cannulation position  verified via imaging on arrival to ICU. Concerns communicated to attending surgeon. Labs reviewed.   All ECMO safety checks complete. ECMO flowsheet initiated, applicable charges captured, LDA's entered/confirmed, imaging and labs verified, blood products available, and report given to Anell Barr.

## 2019-10-22 NOTE — Progress Notes (Signed)
CRITICAL VALUE ALERT  Critical Value:  Ca 6.3  Date & Time Notied:  11/03/2019 1740  Provider Notified: Dr. Roxy Manns notified.  Orders Received/Actions taken: 1 gm calcium chloride given.

## 2019-10-22 NOTE — Anesthesia Preprocedure Evaluation (Addendum)
Anesthesia Evaluation  Patient identified by MRN, date of birth, ID band Patient unresponsive    Reviewed: Patient's Chart, lab work & pertinent test results, Unable to perform ROS - Chart review onlyPreop documentation limited or incomplete due to emergent nature of procedure.  Airway Mallampati: Intubated       Dental   Pulmonary     + decreased breath sounds      Cardiovascular hypertension,  Rhythm:Regular Rate:Tachycardia     Neuro/Psych    GI/Hepatic   Endo/Other  diabetes  Renal/GU      Musculoskeletal   Abdominal   Peds  Hematology   Anesthesia Other Findings Patient brought to OR from SICU, intubated, on ECMO.  Reproductive/Obstetrics                             Anesthesia Physical Anesthesia Plan  ASA: IV and emergent  Anesthesia Plan: General   Post-op Pain Management:    Induction: Intravenous  PONV Risk Score and Plan: Ondansetron  Airway Management Planned: Oral ETT  Additional Equipment: Arterial line, CVP, PA Cath and 3D TEE  Intra-op Plan:   Post-operative Plan: Post-operative intubation/ventilation  Informed Consent: I have reviewed the patients History and Physical, chart, labs and discussed the procedure including the risks, benefits and alternatives for the proposed anesthesia with the patient or authorized representative who has indicated his/her understanding and acceptance.       Plan Discussed with: CRNA and Anesthesiologist  Anesthesia Plan Comments:         Anesthesia Quick Evaluation

## 2019-10-22 NOTE — Op Note (Signed)
CARDIOTHORACIC SURGERY OPERATIVE NOTE  Date of Procedure:   10/26/2019  Preoperative Diagnosis:  Bleeding s/p Median Sternotomy and Cannulation for ECMO Support  Postoperative Diagnosis:  same  Procedure:    Reexploration of Mediastinum for Bleeding  Surgeon:    Valentina Gu. Roxy Manns, MD  Assistant:    Enid Cutter, PA-C  Anesthesia:    Roberts Gaudy, MD  Operative Findings:   Extensive mediastinal blood and clot but relatively minimal active bleeding  Improved coagulopathy    DETAILS OF THE OPERATIVE PROCEDURE  The patient is brought to the operating room on the above mentioned date and placed in the supine position on the operating table.  General endotracheal anesthesia is monitored under the care and direction of Dr. Roberts Gaudy.  The patient's existing wound VAC is removed and the anterior chest, abdomen, and both groins are prepared and draped in a sterile manner leaving the Esmark dressing intact.  Intravenous antibiotics are administered.  A timeout procedure was performed.  The existing Esmark dressing is removed.  There is a large amount of clot in the anterior mediastinum which is removed.  All previous laparotomy pads are removed.  There is a large amount of clot and bright red clotted blood surrounding all sides of the heart.  All of the clot and old blood is evacuated.  Mediastinum is irrigated with saline solution and a careful search for signs of significant mechanical active bleeding is performed.  Minimal active bleeding is encountered other than a few small areas where veins in the superior mediastinum were bleeding and along the anterior chest wall at the inferior apex of the surgical incision.  Additionally there appeared to be tendency for mild oozing around the venous cannula whenever the right atrium is severely distended.  An additional 4-0 Prolene pursestring sutures placed around the base of the venous cannula and secured.  Patient is observed in the operating  room for more than 2 hours while continued resuscitation with blood products is performed.  Coagulopathy continues to gradually improve and minimal oozing from the soft tissues is appreciated.  One of the 2 previous chest tubes in the mediastinum was removed and replaced using the right angle 36 French chest tube.  An additional 8 French right angled chest tube was placed to drain the inferior posterior mediastinal along the base of the right ventricle.  Finally, a 59 French Bard drain is placed anteriorly in the superior mediastinum.  Prior to closure the patient is carefully assessed and noted to have stable hemodynamics with stable ECMO flows.  The heart and in particular the right ventricle is severely swollen and distended.  The 2 free edges of the sternum are proper part with a modified Toomey syringe.  A total of 5 small (4 x 18) laparotomy pads are utilized to pad the sternal edges bilaterally to prevent direct trauma to the surface of the heart.  New Esmark dressing is placed to cover the mediastinum.  The Esmark is covered with abdominal wound VAC sponge and wound VAC device.  The patient tolerated the procedure remarkably well and was transported back to the cardiovascular intensive care unit in critical but stable condition.  There were no intraoperative complications.     Valentina Gu. Roxy Manns MD 10/31/2019 4:01 PM

## 2019-10-22 NOTE — Progress Notes (Signed)
  Called to see patient for inability to flow well on ECMO with flows down to about 2.1L. Pven ~0   CVP 14. Bleeding has slowed.   Pulsativity variable on arterial line.   Hgb checked 9.7. PLTs 74k   Suspicion for recurrent tamponade.  I discussed with Dr. Orvan Seen and we decreased suction on Wound Vac with marked improvements in ECMO flow.   MAPs stable 65-70.   Will continue to follow closely. If recurrent low flows may need repeat chest washout.   Additional CCT 45 mins  Glori Bickers, MD  8:02 PM

## 2019-10-22 NOTE — Brief Op Note (Signed)
10/17/2019 - 10/20/2019  4:36 AM  PATIENT:  [name]  49 y.o. female  PRE-OPERATIVE DIAGNOSIS:  status post CABG, bleeding  POST-OPERATIVE DIAGNOSIS:  status post CABG, bleeding  PROCEDURE:  Procedure(s): Re-exploration of post-operative open heart procedure for bleeding (N/A) Cannulation For Ecmo (Extracorporeal Membrane Oxygenation) (N/A)  SURGEON:  Surgeon(s) and Role:    Rexene Alberts, MD - Primary    * Wonda Olds, MD - Assisting  ANESTHESIA:   general  EBL:  > 6.2 liters   BLOOD ADMINISTERED:20 units CC PRBC, 8 packs cryo, 6 units platelets, 14 units FFP  DRAINS: 2 Chest Tube(s) in the mediastinum and 3 in right pleural space   LOCAL MEDICATIONS USED:  NONE  SPECIMEN:  No Specimen  DISPOSITION OF SPECIMEN:  N/A  COUNTS:  YES  TOURNIQUET:  * No tourniquets in log *  DICTATION: .Note written in EPIC  PLAN OF CARE: Admit to inpatient   PATIENT DISPOSITION:  ICU - intubated and critically ill.   Delay start of Pharmacological VTE agent (>24hrs) due to surgical blood loss or risk of bleeding: yes

## 2019-10-22 NOTE — Procedures (Signed)
3 ffp and 2 platelets given per md order emergency release:  [id number] A [id number] P  See blood bank sheet.

## 2019-10-22 NOTE — OR Nursing (Signed)
1515  5 - 4x18 lap sponges and one 50 cc syring without the plunger left in patient's chest.

## 2019-10-22 NOTE — Transfer of Care (Signed)
Immediate Anesthesia Transfer of Care Note  Patient: [name]  Procedure(s) Performed: EXPLORATION POST OPERATIVE OPEN HEART (N/A Chest) Transesophageal Echocardiogram (Tee) (N/A ) Application Of Wound Vac (N/A )  Patient Location: SICU  Anesthesia Type:General  Level of Consciousness: patient cooperative and Patient remains intubated per anesthesia plan  Airway & Oxygen Therapy: Patient remains intubated per anesthesia plan and Patient placed on Ventilator (see vital sign flow sheet for setting)  Post-op Assessment: Report given to RN and Post -op Vital signs reviewed and stable  Post vital signs: Reviewed and stable  Last Vitals:  Vitals Value Taken Time  BP 118/67 10/29/2019 1622  Temp 36.4 C 11/11/2019 1625  Pulse 87 11/12/2019 1622  Resp 0 11/07/2019 1625  SpO2 100 % 11/09/2019 1625  Vitals shown include unvalidated device data.  Last Pain:  Vitals:   10/31/2019 1750  TempSrc: Axillary         Complications: No apparent anesthesia complications

## 2019-10-22 NOTE — Progress Notes (Signed)
Patient was transported to OR 15 with 2 RT's & OR staff without any complications.

## 2019-10-22 NOTE — Anesthesia Postprocedure Evaluation (Signed)
Anesthesia Post Note  Patient: [name]  Procedure(s) Performed: EXPLORATION POST OPERATIVE OPEN HEART (N/A Chest) Transesophageal Echocardiogram (Tee) (N/A ) Application Of Wound Vac (N/A )     Patient location during evaluation: SICU Anesthesia Type: General Level of consciousness: sedated, patient remains intubated per anesthesia plan and obtunded/minimal responses Pain management: pain level controlled Vital Signs Assessment: post-procedure vital signs reviewed and stable Respiratory status: patient remains intubated per anesthesia plan and patient on ventilator - see flowsheet for VS Cardiovascular status: stable Anesthetic complications: no    Last Vitals:  Vitals:   11/04/2019 1745 10/18/2019 1800  BP:    Pulse:    Resp: 12 12  Temp: 36.5 C 36.5 C  SpO2:      Last Pain:  Vitals:   10/31/2019 1750  TempSrc: Axillary                 Mahamed Zalewski COKER

## 2019-10-22 NOTE — Progress Notes (Signed)
Patient was transferred back to the room 2H3 from the OR with the OR team. ECMO pump circuit was connected to oxygen source of the wall on arrival to patient room. Transported w/o complications.

## 2019-10-23 ENCOUNTER — Inpatient Hospital Stay (HOSPITAL_COMMUNITY): Payer: BC Managed Care – PPO | Admitting: Certified Registered Nurse Anesthetist

## 2019-10-23 ENCOUNTER — Encounter (HOSPITAL_COMMUNITY)
Admission: RE | Disposition: E | Payer: Self-pay | Source: Home / Self Care | Attending: Thoracic Surgery (Cardiothoracic Vascular Surgery)

## 2019-10-23 ENCOUNTER — Inpatient Hospital Stay (HOSPITAL_COMMUNITY): Payer: BC Managed Care – PPO

## 2019-10-23 DIAGNOSIS — M329 Systemic lupus erythematosus, unspecified: Secondary | ICD-10-CM

## 2019-10-23 DIAGNOSIS — J9601 Acute respiratory failure with hypoxia: Secondary | ICD-10-CM

## 2019-10-23 DIAGNOSIS — I9789 Other postprocedural complications and disorders of the circulatory system, not elsewhere classified: Secondary | ICD-10-CM | POA: Diagnosis not present

## 2019-10-23 DIAGNOSIS — R570 Cardiogenic shock: Secondary | ICD-10-CM

## 2019-10-23 DIAGNOSIS — I50813 Acute on chronic right heart failure: Secondary | ICD-10-CM

## 2019-10-23 DIAGNOSIS — D689 Coagulation defect, unspecified: Secondary | ICD-10-CM

## 2019-10-23 DIAGNOSIS — I071 Rheumatic tricuspid insufficiency: Principal | ICD-10-CM

## 2019-10-23 HISTORY — PX: APPLICATION OF WOUND VAC: SHX5189

## 2019-10-23 HISTORY — PX: MEDIASTINAL EXPLORATION: SHX5065

## 2019-10-23 LAB — BPAM PLATELET PHERESIS
Blood Product Expiration Date: 202104102359
Blood Product Expiration Date: 202104112359
Blood Product Expiration Date: 202104112359
Blood Product Expiration Date: 202104112359
Blood Product Expiration Date: 202104082218
Blood Product Expiration Date: 202104102359
Blood Product Expiration Date: 202104102359
Blood Product Expiration Date: 202104102359
Blood Product Expiration Date: 202104102359
Blood Product Expiration Date: 202104102359
Blood Product Expiration Date: 202104102359
Blood Product Expiration Date: 202104112359
Blood Product Expiration Date: 202104112359
Blood Product Expiration Date: 202104112359
Blood Product Expiration Date: 202104112359
ISSUE DATE / TIME: 202104081332
ISSUE DATE / TIME: 202104081435
ISSUE DATE / TIME: 202104081435
ISSUE DATE / TIME: 202104081722
ISSUE DATE / TIME: 202104080140
ISSUE DATE / TIME: 202104080140
ISSUE DATE / TIME: 202104080422
ISSUE DATE / TIME: 202104080422
ISSUE DATE / TIME: 202104080814
ISSUE DATE / TIME: 202104080814
ISSUE DATE / TIME: 202104081028
ISSUE DATE / TIME: 202104081028
ISSUE DATE / TIME: 202104081028
ISSUE DATE / TIME: 202104081028
ISSUE DATE / TIME: 202104082346
Unit Type and Rh: 6200
Unit Type and Rh: 7300
Unit Type and Rh: 7300
Unit Type and Rh: 7300
Unit Type and Rh: 5100
Unit Type and Rh: 5100
Unit Type and Rh: 5100
Unit Type and Rh: 5100
Unit Type and Rh: 6200
Unit Type and Rh: 6200
Unit Type and Rh: 6200
Unit Type and Rh: 6200
Unit Type and Rh: 7300
Unit Type and Rh: 9500
Unit Type and Rh: 9500

## 2019-10-23 LAB — PREPARE FRESH FROZEN PLASMA
Unit division: 0
Unit division: 0
Unit division: 0
Unit division: 0
Unit division: 0
Unit division: 0
Unit division: 0
Unit division: 0
Unit division: 0
Unit division: 0
Unit division: 0
Unit division: 0
Unit division: 0
Unit division: 0

## 2019-10-23 LAB — BPAM FFP
Blood Product Expiration Date: 202104112359
Blood Product Expiration Date: 202104122359
Blood Product Expiration Date: 202104122359
Blood Product Expiration Date: 202104122359
Blood Product Expiration Date: 202104122359
Blood Product Expiration Date: 202104122359
Blood Product Expiration Date: 202104122359
Blood Product Expiration Date: 202104122359
Blood Product Expiration Date: 202104122359
Blood Product Expiration Date: 202104122359
Blood Product Expiration Date: 202104122359
Blood Product Expiration Date: 202104122359
Blood Product Expiration Date: 202104122359
Blood Product Expiration Date: 202104122359
Blood Product Expiration Date: 202104122359
Blood Product Expiration Date: 202104122359
Blood Product Expiration Date: 202104122359
Blood Product Expiration Date: 202104122359
Blood Product Expiration Date: 202104122359
Blood Product Expiration Date: 202104122359
Blood Product Expiration Date: 202104132359
Blood Product Expiration Date: 202104132359
Blood Product Expiration Date: 202104132359
ISSUE DATE / TIME: 202104071613
ISSUE DATE / TIME: 202104071635
ISSUE DATE / TIME: 202104071928
ISSUE DATE / TIME: 202104071928
ISSUE DATE / TIME: 202104071942
ISSUE DATE / TIME: 202104071942
ISSUE DATE / TIME: 202104072103
ISSUE DATE / TIME: 202104072103
ISSUE DATE / TIME: 202104072132
ISSUE DATE / TIME: 202104072132
ISSUE DATE / TIME: 202104072132
ISSUE DATE / TIME: 202104072132
ISSUE DATE / TIME: 202104080136
ISSUE DATE / TIME: 202104080136
ISSUE DATE / TIME: 202104080136
ISSUE DATE / TIME: 202104080136
ISSUE DATE / TIME: 202104080422
ISSUE DATE / TIME: 202104080422
ISSUE DATE / TIME: 202104080422
ISSUE DATE / TIME: 202104080739
ISSUE DATE / TIME: 202104080739
ISSUE DATE / TIME: 202104080739
ISSUE DATE / TIME: 202104080739
Unit Type and Rh: 600
Unit Type and Rh: 600
Unit Type and Rh: 600
Unit Type and Rh: 600
Unit Type and Rh: 6200
Unit Type and Rh: 6200
Unit Type and Rh: 6200
Unit Type and Rh: 6200
Unit Type and Rh: 6200
Unit Type and Rh: 6200
Unit Type and Rh: 6200
Unit Type and Rh: 6200
Unit Type and Rh: 6200
Unit Type and Rh: 6200
Unit Type and Rh: 6200
Unit Type and Rh: 6200
Unit Type and Rh: 6200
Unit Type and Rh: 6200
Unit Type and Rh: 6200
Unit Type and Rh: 6200
Unit Type and Rh: 600
Unit Type and Rh: 6200
Unit Type and Rh: 6200

## 2019-10-23 LAB — TYPE AND SCREEN
ABO/RH(D): A POS
Antibody Screen: POSITIVE
Unit division: 0
Unit division: 0
Unit division: 0
Unit division: 0
Unit division: 0
Unit division: 0
Unit division: 0

## 2019-10-23 LAB — GLUCOSE, CAPILLARY
Glucose-Capillary: 109 mg/dL — ABNORMAL HIGH (ref 70–99)
Glucose-Capillary: 64 mg/dL — ABNORMAL LOW (ref 70–99)
Glucose-Capillary: 70 mg/dL (ref 70–99)
Glucose-Capillary: 72 mg/dL (ref 70–99)
Glucose-Capillary: 74 mg/dL (ref 70–99)
Glucose-Capillary: 74 mg/dL (ref 70–99)
Glucose-Capillary: 74 mg/dL (ref 70–99)
Glucose-Capillary: 75 mg/dL (ref 70–99)
Glucose-Capillary: 77 mg/dL (ref 70–99)
Glucose-Capillary: 80 mg/dL (ref 70–99)
Glucose-Capillary: 81 mg/dL (ref 70–99)
Glucose-Capillary: 82 mg/dL (ref 70–99)
Glucose-Capillary: 83 mg/dL (ref 70–99)
Glucose-Capillary: 83 mg/dL (ref 70–99)
Glucose-Capillary: 84 mg/dL (ref 70–99)
Glucose-Capillary: 87 mg/dL (ref 70–99)
Glucose-Capillary: 92 mg/dL (ref 70–99)
Glucose-Capillary: 96 mg/dL (ref 70–99)
Glucose-Capillary: 97 mg/dL (ref 70–99)

## 2019-10-23 LAB — PREPARE CRYOPRECIPITATE
Unit division: 0
Unit division: 0
Unit division: 0
Unit division: 0
Unit division: 0
Unit division: 0
Unit division: 0
Unit division: 0
Unit division: 0
Unit division: 0
Unit division: 0
Unit division: 0
Unit division: 0
Unit division: 0

## 2019-10-23 LAB — PREPARE PLATELET PHERESIS
Unit division: 0
Unit division: 0
Unit division: 0
Unit division: 0
Unit division: 0
Unit division: 0
Unit division: 0
Unit division: 0
Unit division: 0
Unit division: 0
Unit division: 0
Unit division: 0
Unit division: 0
Unit division: 0
Unit division: 0

## 2019-10-23 LAB — POCT I-STAT 7, (LYTES, BLD GAS, ICA,H+H)
Acid-Base Excess: 1 mmol/L (ref 0.0–2.0)
Acid-Base Excess: 6 mmol/L — ABNORMAL HIGH (ref 0.0–2.0)
Acid-Base Excess: 7 mmol/L — ABNORMAL HIGH (ref 0.0–2.0)
Acid-Base Excess: 7 mmol/L — ABNORMAL HIGH (ref 0.0–2.0)
Acid-Base Excess: 5 mmol/L — ABNORMAL HIGH (ref 0.0–2.0)
Acid-Base Excess: 3 mmol/L — ABNORMAL HIGH (ref 0.0–2.0)
Acid-base deficit: 1 mmol/L (ref 0.0–2.0)
Acid-base deficit: 17 mmol/L — ABNORMAL HIGH (ref 0.0–2.0)
Acid-base deficit: 19 mmol/L — ABNORMAL HIGH (ref 0.0–2.0)
Acid-base deficit: 4 mmol/L — ABNORMAL HIGH (ref 0.0–2.0)
Acid-base deficit: 5 mmol/L — ABNORMAL HIGH (ref 0.0–2.0)
Bicarbonate: 31.4 mmol/L — ABNORMAL HIGH (ref 20.0–28.0)
Bicarbonate: 30.7 mmol/L — ABNORMAL HIGH (ref 20.0–28.0)
Bicarbonate: 11.5 mmol/L — ABNORMAL LOW (ref 20.0–28.0)
Bicarbonate: 17.6 mmol/L — ABNORMAL LOW (ref 20.0–28.0)
Bicarbonate: 20.1 mmol/L (ref 20.0–28.0)
Bicarbonate: 22.4 mmol/L (ref 20.0–28.0)
Bicarbonate: 24.2 mmol/L (ref 20.0–28.0)
Bicarbonate: 8.9 mmol/L — ABNORMAL LOW (ref 20.0–28.0)
Bicarbonate: 31.8 mmol/L — ABNORMAL HIGH (ref 20.0–28.0)
Bicarbonate: 27 mmol/L (ref 20.0–28.0)
Bicarbonate: 28.6 mmol/L — ABNORMAL HIGH (ref 20.0–28.0)
Calcium, Ion: 1.1 mmol/L — ABNORMAL LOW (ref 1.15–1.40)
Calcium, Ion: 1.15 mmol/L (ref 1.15–1.40)
Calcium, Ion: 0.31 mmol/L — CL (ref 1.15–1.40)
Calcium, Ion: 0.67 mmol/L — CL (ref 1.15–1.40)
Calcium, Ion: 0.9 mmol/L — ABNORMAL LOW (ref 1.15–1.40)
Calcium, Ion: 0.92 mmol/L — ABNORMAL LOW (ref 1.15–1.40)
Calcium, Ion: 0.98 mmol/L — ABNORMAL LOW (ref 1.15–1.40)
Calcium, Ion: 1.04 mmol/L — ABNORMAL LOW (ref 1.15–1.40)
Calcium, Ion: 0.83 mmol/L — CL (ref 1.15–1.40)
Calcium, Ion: 1.12 mmol/L — ABNORMAL LOW (ref 1.15–1.40)
Calcium, Ion: 1.13 mmol/L — ABNORMAL LOW (ref 1.15–1.40)
HCT: 19 % — ABNORMAL LOW (ref 36.0–46.0)
HCT: 22 % — ABNORMAL LOW (ref 36.0–46.0)
HCT: 18 % — ABNORMAL LOW (ref 36.0–46.0)
HCT: 19 % — ABNORMAL LOW (ref 36.0–46.0)
HCT: 20 % — ABNORMAL LOW (ref 36.0–46.0)
HCT: 22 % — ABNORMAL LOW (ref 36.0–46.0)
HCT: 23 % — ABNORMAL LOW (ref 36.0–46.0)
HCT: 30 % — ABNORMAL LOW (ref 36.0–46.0)
HCT: 22 % — ABNORMAL LOW (ref 36.0–46.0)
HCT: 27 % — ABNORMAL LOW (ref 36.0–46.0)
HCT: 30 % — ABNORMAL LOW (ref 36.0–46.0)
Hemoglobin: 6.5 g/dL — CL (ref 12.0–15.0)
Hemoglobin: 7.5 g/dL — ABNORMAL LOW (ref 12.0–15.0)
Hemoglobin: 10.2 g/dL — ABNORMAL LOW (ref 12.0–15.0)
Hemoglobin: 6.1 g/dL — CL (ref 12.0–15.0)
Hemoglobin: 6.5 g/dL — CL (ref 12.0–15.0)
Hemoglobin: 6.8 g/dL — CL (ref 12.0–15.0)
Hemoglobin: 7.5 g/dL — ABNORMAL LOW (ref 12.0–15.0)
Hemoglobin: 7.8 g/dL — ABNORMAL LOW (ref 12.0–15.0)
Hemoglobin: 7.5 g/dL — ABNORMAL LOW (ref 12.0–15.0)
Hemoglobin: 10.2 g/dL — ABNORMAL LOW (ref 12.0–15.0)
Hemoglobin: 9.2 g/dL — ABNORMAL LOW (ref 12.0–15.0)
O2 Saturation: 100 %
O2 Saturation: 100 %
O2 Saturation: 100 %
O2 Saturation: 100 %
O2 Saturation: 100 %
O2 Saturation: 100 %
O2 Saturation: 100 %
O2 Saturation: 100 %
O2 Saturation: 100 %
O2 Saturation: 100 %
O2 Saturation: 100 %
Patient temperature: 34.9
Patient temperature: 36.7
Patient temperature: 36.5
Patient temperature: 37.1
Patient temperature: 36.9
Potassium: 4 mmol/L (ref 3.5–5.1)
Potassium: 4 mmol/L (ref 3.5–5.1)
Potassium: 3.2 mmol/L — ABNORMAL LOW (ref 3.5–5.1)
Potassium: 3.7 mmol/L (ref 3.5–5.1)
Potassium: 3.8 mmol/L (ref 3.5–5.1)
Potassium: 3.8 mmol/L (ref 3.5–5.1)
Potassium: 4.4 mmol/L (ref 3.5–5.1)
Potassium: 4.7 mmol/L (ref 3.5–5.1)
Potassium: 4.4 mmol/L (ref 3.5–5.1)
Potassium: 3.6 mmol/L (ref 3.5–5.1)
Potassium: 3.8 mmol/L (ref 3.5–5.1)
Sodium: 152 mmol/L — ABNORMAL HIGH (ref 135–145)
Sodium: 151 mmol/L — ABNORMAL HIGH (ref 135–145)
Sodium: 144 mmol/L (ref 135–145)
Sodium: 147 mmol/L — ABNORMAL HIGH (ref 135–145)
Sodium: 148 mmol/L — ABNORMAL HIGH (ref 135–145)
Sodium: 149 mmol/L — ABNORMAL HIGH (ref 135–145)
Sodium: 151 mmol/L — ABNORMAL HIGH (ref 135–145)
Sodium: 152 mmol/L — ABNORMAL HIGH (ref 135–145)
Sodium: 152 mmol/L — ABNORMAL HIGH (ref 135–145)
Sodium: 150 mmol/L — ABNORMAL HIGH (ref 135–145)
Sodium: 150 mmol/L — ABNORMAL HIGH (ref 135–145)
TCO2: 33 mmol/L — ABNORMAL HIGH (ref 22–32)
TCO2: 32 mmol/L (ref 22–32)
TCO2: 10 mmol/L — ABNORMAL LOW (ref 22–32)
TCO2: 13 mmol/L — ABNORMAL LOW (ref 22–32)
TCO2: 18 mmol/L — ABNORMAL LOW (ref 22–32)
TCO2: 21 mmol/L — ABNORMAL LOW (ref 22–32)
TCO2: 23 mmol/L (ref 22–32)
TCO2: 25 mmol/L (ref 22–32)
TCO2: 33 mmol/L — ABNORMAL HIGH (ref 22–32)
TCO2: 28 mmol/L (ref 22–32)
TCO2: 30 mmol/L (ref 22–32)
pCO2 arterial: 40.3 mmHg (ref 32.0–48.0)
pCO2 arterial: 38.3 mmHg (ref 32.0–48.0)
pCO2 arterial: 25.9 mmHg — ABNORMAL LOW (ref 32.0–48.0)
pCO2 arterial: 26.8 mmHg — ABNORMAL LOW (ref 32.0–48.0)
pCO2 arterial: 28.1 mmHg — ABNORMAL LOW (ref 32.0–48.0)
pCO2 arterial: 31 mmHg — ABNORMAL LOW (ref 32.0–48.0)
pCO2 arterial: 33.2 mmHg (ref 32.0–48.0)
pCO2 arterial: 38.6 mmHg (ref 32.0–48.0)
pCO2 arterial: 55.3 mmHg — ABNORMAL HIGH (ref 32.0–48.0)
pCO2 arterial: 38.4 mmHg (ref 32.0–48.0)
pCO2 arterial: 39.6 mmHg (ref 32.0–48.0)
pH, Arterial: 7.498 — ABNORMAL HIGH (ref 7.350–7.450)
pH, Arterial: 7.51 — ABNORMAL HIGH (ref 7.350–7.450)
pH, Arterial: 7.082 — CL (ref 7.350–7.450)
pH, Arterial: 7.116 — CL (ref 7.350–7.450)
pH, Arterial: 7.39 (ref 7.350–7.450)
pH, Arterial: 7.441 (ref 7.350–7.450)
pH, Arterial: 7.468 — ABNORMAL HIGH (ref 7.350–7.450)
pH, Arterial: 7.543 — ABNORMAL HIGH (ref 7.350–7.450)
pH, Arterial: 7.368 (ref 7.350–7.450)
pH, Arterial: 7.442 (ref 7.350–7.450)
pH, Arterial: 7.48 — ABNORMAL HIGH (ref 7.350–7.450)
pO2, Arterial: 469 mmHg — ABNORMAL HIGH (ref 83.0–108.0)
pO2, Arterial: 414 mmHg — ABNORMAL HIGH (ref 83.0–108.0)
pO2, Arterial: 270 mmHg — ABNORMAL HIGH (ref 83.0–108.0)
pO2, Arterial: 405 mmHg — ABNORMAL HIGH (ref 83.0–108.0)
pO2, Arterial: 413 mmHg — ABNORMAL HIGH (ref 83.0–108.0)
pO2, Arterial: 445 mmHg — ABNORMAL HIGH (ref 83.0–108.0)
pO2, Arterial: 452 mmHg — ABNORMAL HIGH (ref 83.0–108.0)
pO2, Arterial: 503 mmHg — ABNORMAL HIGH (ref 83.0–108.0)
pO2, Arterial: 535 mmHg — ABNORMAL HIGH (ref 83.0–108.0)
pO2, Arterial: 280 mmHg — ABNORMAL HIGH (ref 83.0–108.0)
pO2, Arterial: 330 mmHg — ABNORMAL HIGH (ref 83.0–108.0)

## 2019-10-23 LAB — POCT I-STAT, CHEM 8
BUN: 14 mg/dL (ref 6–20)
BUN: 10 mg/dL (ref 6–20)
BUN: 13 mg/dL (ref 6–20)
BUN: 17 mg/dL (ref 6–20)
BUN: 19 mg/dL (ref 6–20)
BUN: 24 mg/dL — ABNORMAL HIGH (ref 6–20)
BUN: 22 mg/dL — ABNORMAL HIGH (ref 6–20)
Calcium, Ion: 1.07 mmol/L — ABNORMAL LOW (ref 1.15–1.40)
Calcium, Ion: 0.92 mmol/L — ABNORMAL LOW (ref 1.15–1.40)
Calcium, Ion: 0.92 mmol/L — ABNORMAL LOW (ref 1.15–1.40)
Calcium, Ion: 0.81 mmol/L — CL (ref 1.15–1.40)
Calcium, Ion: 0.99 mmol/L — ABNORMAL LOW (ref 1.15–1.40)
Calcium, Ion: 1.09 mmol/L — ABNORMAL LOW (ref 1.15–1.40)
Calcium, Ion: 1.04 mmol/L — ABNORMAL LOW (ref 1.15–1.40)
Chloride: 112 mmol/L — ABNORMAL HIGH (ref 98–111)
Chloride: 113 mmol/L — ABNORMAL HIGH (ref 98–111)
Chloride: 107 mmol/L (ref 98–111)
Chloride: 108 mmol/L (ref 98–111)
Chloride: 111 mmol/L (ref 98–111)
Chloride: 112 mmol/L — ABNORMAL HIGH (ref 98–111)
Chloride: 113 mmol/L — ABNORMAL HIGH (ref 98–111)
Creatinine, Ser: 0.9 mg/dL (ref 0.44–1.00)
Creatinine, Ser: 0.6 mg/dL (ref 0.44–1.00)
Creatinine, Ser: 0.9 mg/dL (ref 0.44–1.00)
Creatinine, Ser: 1.2 mg/dL — ABNORMAL HIGH (ref 0.44–1.00)
Creatinine, Ser: 1.4 mg/dL — ABNORMAL HIGH (ref 0.44–1.00)
Creatinine, Ser: 2.1 mg/dL — ABNORMAL HIGH (ref 0.44–1.00)
Creatinine, Ser: 2 mg/dL — ABNORMAL HIGH (ref 0.44–1.00)
Glucose, Bld: 86 mg/dL (ref 70–99)
Glucose, Bld: 249 mg/dL — ABNORMAL HIGH (ref 70–99)
Glucose, Bld: 148 mg/dL — ABNORMAL HIGH (ref 70–99)
Glucose, Bld: 165 mg/dL — ABNORMAL HIGH (ref 70–99)
Glucose, Bld: 119 mg/dL — ABNORMAL HIGH (ref 70–99)
Glucose, Bld: 82 mg/dL (ref 70–99)
Glucose, Bld: 78 mg/dL (ref 70–99)
HCT: 21 % — ABNORMAL LOW (ref 36.0–46.0)
HCT: 19 % — ABNORMAL LOW (ref 36.0–46.0)
HCT: 20 % — ABNORMAL LOW (ref 36.0–46.0)
HCT: 23 % — ABNORMAL LOW (ref 36.0–46.0)
HCT: 26 % — ABNORMAL LOW (ref 36.0–46.0)
HCT: 24 % — ABNORMAL LOW (ref 36.0–46.0)
HCT: 23 % — ABNORMAL LOW (ref 36.0–46.0)
Hemoglobin: 7.1 g/dL — ABNORMAL LOW (ref 12.0–15.0)
Hemoglobin: 6.5 g/dL — CL (ref 12.0–15.0)
Hemoglobin: 6.8 g/dL — CL (ref 12.0–15.0)
Hemoglobin: 7.8 g/dL — ABNORMAL LOW (ref 12.0–15.0)
Hemoglobin: 8.8 g/dL — ABNORMAL LOW (ref 12.0–15.0)
Hemoglobin: 8.2 g/dL — ABNORMAL LOW (ref 12.0–15.0)
Hemoglobin: 7.8 g/dL — ABNORMAL LOW (ref 12.0–15.0)
Potassium: 4.5 mmol/L (ref 3.5–5.1)
Potassium: 3.1 mmol/L — ABNORMAL LOW (ref 3.5–5.1)
Potassium: 3.7 mmol/L (ref 3.5–5.1)
Potassium: 4.4 mmol/L (ref 3.5–5.1)
Potassium: 4.1 mmol/L (ref 3.5–5.1)
Potassium: 4 mmol/L (ref 3.5–5.1)
Potassium: 3.6 mmol/L (ref 3.5–5.1)
Sodium: 143 mmol/L (ref 135–145)
Sodium: 146 mmol/L — ABNORMAL HIGH (ref 135–145)
Sodium: 152 mmol/L — ABNORMAL HIGH (ref 135–145)
Sodium: 151 mmol/L — ABNORMAL HIGH (ref 135–145)
Sodium: 150 mmol/L — ABNORMAL HIGH (ref 135–145)
Sodium: 150 mmol/L — ABNORMAL HIGH (ref 135–145)
Sodium: 151 mmol/L — ABNORMAL HIGH (ref 135–145)
TCO2: 25 mmol/L (ref 22–32)
TCO2: 14 mmol/L — ABNORMAL LOW (ref 22–32)
TCO2: 36 mmol/L — ABNORMAL HIGH (ref 22–32)
TCO2: 33 mmol/L — ABNORMAL HIGH (ref 22–32)
TCO2: 32 mmol/L (ref 22–32)
TCO2: 32 mmol/L (ref 22–32)
TCO2: 28 mmol/L (ref 22–32)

## 2019-10-23 LAB — BASIC METABOLIC PANEL
Anion gap: 12 (ref 5–15)
Anion gap: 9 (ref 5–15)
BUN: 24 mg/dL — ABNORMAL HIGH (ref 6–20)
BUN: 28 mg/dL — ABNORMAL HIGH (ref 6–20)
CO2: 25 mmol/L (ref 22–32)
CO2: 25 mmol/L (ref 22–32)
Calcium: 7.4 mg/dL — ABNORMAL LOW (ref 8.9–10.3)
Calcium: 7.3 mg/dL — ABNORMAL LOW (ref 8.9–10.3)
Chloride: 115 mmol/L — ABNORMAL HIGH (ref 98–111)
Chloride: 117 mmol/L — ABNORMAL HIGH (ref 98–111)
Creatinine, Ser: 2.04 mg/dL — ABNORMAL HIGH (ref 0.44–1.00)
Creatinine, Ser: 2.25 mg/dL — ABNORMAL HIGH (ref 0.44–1.00)
GFR calc Af Amer: 33 mL/min — ABNORMAL LOW (ref >60)
GFR calc Af Amer: 29 mL/min — ABNORMAL LOW (ref >60)
GFR calc non Af Amer: 29 mL/min — ABNORMAL LOW (ref >60)
GFR calc non Af Amer: 25 mL/min — ABNORMAL LOW (ref >60)
Glucose, Bld: 74 mg/dL (ref 70–99)
Glucose, Bld: 68 mg/dL — ABNORMAL LOW (ref 70–99)
Potassium: 4.1 mmol/L (ref 3.5–5.1)
Potassium: 3.9 mmol/L (ref 3.5–5.1)
Sodium: 152 mmol/L — ABNORMAL HIGH (ref 135–145)
Sodium: 151 mmol/L — ABNORMAL HIGH (ref 135–145)

## 2019-10-23 LAB — BPAM CRYOPRECIPITATE
Blood Product Expiration Date: 202104080730
Blood Product Expiration Date: 202104080730
Blood Product Expiration Date: 202104080730
Blood Product Expiration Date: 202104080730
Blood Product Expiration Date: 202104080840
Blood Product Expiration Date: 202104080840
Blood Product Expiration Date: 202104080840
Blood Product Expiration Date: 202104080840
Blood Product Expiration Date: 202104081430
Blood Product Expiration Date: 202104081430
Blood Product Expiration Date: 202104081938
Blood Product Expiration Date: 202104081938
Blood Product Expiration Date: 202104081938
Blood Product Expiration Date: 202104081938
ISSUE DATE / TIME: 202104080153
ISSUE DATE / TIME: 202104080153
ISSUE DATE / TIME: 202104080153
ISSUE DATE / TIME: 202104080153
ISSUE DATE / TIME: 202104080422
ISSUE DATE / TIME: 202104080422
ISSUE DATE / TIME: 202104080733
ISSUE DATE / TIME: 202104080733
ISSUE DATE / TIME: 202104081250
ISSUE DATE / TIME: 202104081250
ISSUE DATE / TIME: 202104081410
ISSUE DATE / TIME: 202104081410
ISSUE DATE / TIME: 202104081410
ISSUE DATE / TIME: 202104081410
Unit Type and Rh: 6200
Unit Type and Rh: 6200
Unit Type and Rh: 6200
Unit Type and Rh: 6200
Unit Type and Rh: 5100
Unit Type and Rh: 5100
Unit Type and Rh: 5100
Unit Type and Rh: 5100
Unit Type and Rh: 5100
Unit Type and Rh: 6200
Unit Type and Rh: 6200
Unit Type and Rh: 6200
Unit Type and Rh: 6200
Unit Type and Rh: 6200

## 2019-10-23 LAB — BPAM RBC
Blood Product Expiration Date: 202105012359
Blood Product Expiration Date: 202105022359
Blood Product Expiration Date: 202105032359
Blood Product Expiration Date: 202105062359
Blood Product Expiration Date: 202105082359
Blood Product Expiration Date: 202105092359
Blood Product Expiration Date: 202105132359
ISSUE DATE / TIME: 202104070906
ISSUE DATE / TIME: 202104070906
ISSUE DATE / TIME: 202104071044
ISSUE DATE / TIME: 202104071044
ISSUE DATE / TIME: 202104071044
ISSUE DATE / TIME: 202104071044
ISSUE DATE / TIME: 202104072100
Unit Type and Rh: 5100
Unit Type and Rh: 5100
Unit Type and Rh: 5100
Unit Type and Rh: 5100
Unit Type and Rh: 6200
Unit Type and Rh: 9500
Unit Type and Rh: 9500

## 2019-10-23 LAB — CBC
HCT: 22.6 % — ABNORMAL LOW (ref 36.0–46.0)
HCT: 23.8 % — ABNORMAL LOW (ref 36.0–46.0)
HCT: 31.3 % — ABNORMAL LOW (ref 36.0–46.0)
Hemoglobin: 8 g/dL — ABNORMAL LOW (ref 12.0–15.0)
Hemoglobin: 8.4 g/dL — ABNORMAL LOW (ref 12.0–15.0)
Hemoglobin: 11 g/dL — ABNORMAL LOW (ref 12.0–15.0)
MCH: 29.4 pg (ref 26.0–34.0)
MCH: 29.4 pg (ref 26.0–34.0)
MCH: 30.3 pg (ref 26.0–34.0)
MCHC: 35.4 g/dL (ref 30.0–36.0)
MCHC: 35.3 g/dL (ref 30.0–36.0)
MCHC: 35.1 g/dL (ref 30.0–36.0)
MCV: 83.1 fL (ref 80.0–100.0)
MCV: 83.2 fL (ref 80.0–100.0)
MCV: 86.2 fL (ref 80.0–100.0)
Platelets: 64 10*3/uL — ABNORMAL LOW (ref 150–400)
Platelets: 51 10*3/uL — ABNORMAL LOW (ref 150–400)
Platelets: 86 10*3/uL — ABNORMAL LOW (ref 150–400)
RBC: 2.72 MIL/uL — ABNORMAL LOW (ref 3.87–5.11)
RBC: 2.86 MIL/uL — ABNORMAL LOW (ref 3.87–5.11)
RBC: 3.63 MIL/uL — ABNORMAL LOW (ref 3.87–5.11)
RDW: 15.3 % (ref 11.5–15.5)
RDW: 15.5 % (ref 11.5–15.5)
RDW: 15.8 % — ABNORMAL HIGH (ref 11.5–15.5)
WBC: 3.6 10*3/uL — ABNORMAL LOW (ref 4.0–10.5)
WBC: 4.3 10*3/uL (ref 4.0–10.5)
WBC: 3.3 10*3/uL — ABNORMAL LOW (ref 4.0–10.5)
nRBC: 0 % (ref 0.0–0.2)
nRBC: 0.7 % — ABNORMAL HIGH (ref 0.0–0.2)
nRBC: 0.9 % — ABNORMAL HIGH (ref 0.0–0.2)

## 2019-10-23 LAB — FIBRINOGEN: Fibrinogen: 287 mg/dL (ref 210–475)

## 2019-10-23 LAB — PREPARE RBC (CROSSMATCH)

## 2019-10-23 LAB — ECHO INTRAOPERATIVE TEE
Height: 67 in
Height: 67 in
Height: 67 in
Weight: 2504.43 oz
Weight: 2504.43 oz
Weight: 3492.09 oz

## 2019-10-23 LAB — CALCIUM, IONIZED: Calcium, Ionized, Serum: 3.2 mg/dL — ABNORMAL LOW (ref 4.5–5.6)

## 2019-10-23 LAB — PROTIME-INR
INR: 1.5 — ABNORMAL HIGH (ref 0.8–1.2)
INR: 1.4 — ABNORMAL HIGH (ref 0.8–1.2)
INR: 1.4 — ABNORMAL HIGH (ref 0.8–1.2)
Prothrombin Time: 17.7 seconds — ABNORMAL HIGH (ref 11.4–15.2)
Prothrombin Time: 17.4 seconds — ABNORMAL HIGH (ref 11.4–15.2)
Prothrombin Time: 17.1 seconds — ABNORMAL HIGH (ref 11.4–15.2)

## 2019-10-23 LAB — APTT
aPTT: 39 seconds — ABNORMAL HIGH (ref 24–36)
aPTT: 39 seconds — ABNORMAL HIGH (ref 24–36)

## 2019-10-23 LAB — HEPATIC FUNCTION PANEL
ALT: 116 U/L — ABNORMAL HIGH (ref 0–44)
AST: 242 U/L — ABNORMAL HIGH (ref 15–41)
Albumin: 2.4 g/dL — ABNORMAL LOW (ref 3.5–5.0)
Alkaline Phosphatase: 25 U/L — ABNORMAL LOW (ref 38–126)
Bilirubin, Direct: 0.2 mg/dL (ref 0.0–0.2)
Indirect Bilirubin: 1.1 mg/dL — ABNORMAL HIGH (ref 0.3–0.9)
Total Bilirubin: 1.3 mg/dL — ABNORMAL HIGH (ref 0.3–1.2)
Total Protein: 3.4 g/dL — ABNORMAL LOW (ref 6.5–8.1)

## 2019-10-23 LAB — MAGNESIUM: Magnesium: 2.2 mg/dL (ref 1.7–2.4)

## 2019-10-23 LAB — LACTIC ACID, PLASMA: Lactic Acid, Venous: 1.6 mmol/L (ref 0.5–1.9)

## 2019-10-23 LAB — LACTATE DEHYDROGENASE: LDH: 449 U/L — ABNORMAL HIGH (ref 98–192)

## 2019-10-23 SURGERY — EXPLORATION, MEDIASTINUM
Anesthesia: General | Site: Chest

## 2019-10-23 MED ORDER — ORAL CARE MOUTH RINSE
15.0000 mL | OROMUCOSAL | Status: DC
  Administered 2019-10-23 – 2019-10-26 (×29): 15 mL via OROMUCOSAL

## 2019-10-23 MED ORDER — HEPARIN SODIUM (PORCINE) 5000 UNIT/ML IJ SOLN
INTRAMUSCULAR | Status: AC
  Filled 2019-10-23: qty 1

## 2019-10-23 MED ORDER — SODIUM CHLORIDE 0.9 % IR SOLN
Status: DC | PRN
  Administered 2019-10-23: 1000 mL

## 2019-10-23 MED ORDER — FENTANYL CITRATE (PF) 250 MCG/5ML IJ SOLN
INTRAMUSCULAR | Status: AC
  Filled 2019-10-23: qty 5

## 2019-10-23 MED ORDER — SODIUM CHLORIDE 0.9 % IV SOLN
2.0000 g | Freq: Two times a day (BID) | INTRAVENOUS | Status: DC
  Administered 2019-10-23 – 2019-10-27 (×10): 2 g via INTRAVENOUS
  Filled 2019-10-23 (×10): qty 2

## 2019-10-23 MED ORDER — INSULIN ASPART 100 UNIT/ML ~~LOC~~ SOLN
0.0000 [IU] | SUBCUTANEOUS | Status: DC
  Administered 2019-10-26: 8 [IU] via SUBCUTANEOUS
  Administered 2019-10-26: 4 [IU] via SUBCUTANEOUS
  Administered 2019-10-26: 8 [IU] via SUBCUTANEOUS

## 2019-10-23 MED ORDER — SODIUM CHLORIDE 0.9% IV SOLUTION: Freq: Once | INTRAVENOUS | Status: DC

## 2019-10-23 MED ORDER — CHLORHEXIDINE GLUCONATE 0.12% ORAL RINSE (MEDLINE KIT)
15.0000 mL | Freq: Two times a day (BID) | OROMUCOSAL | Status: DC
  Administered 2019-10-23 – 2019-10-26 (×6): 15 mL via OROMUCOSAL

## 2019-10-23 MED ORDER — MIDAZOLAM HCL 2 MG/2ML IJ SOLN
INTRAMUSCULAR | Status: AC
  Filled 2019-10-23: qty 2

## 2019-10-23 MED ORDER — SODIUM CHLORIDE 0.9% FLUSH: 10.0000 mL | INTRAVENOUS | Status: DC | PRN

## 2019-10-23 MED ORDER — SODIUM CHLORIDE 0.9% FLUSH
10.0000 mL | Freq: Two times a day (BID) | INTRAVENOUS | Status: DC
  Administered 2019-10-23 – 2019-10-25 (×5): 10 mL
  Administered 2019-10-26: 30 mL

## 2019-10-23 MED ORDER — ARTIFICIAL TEARS OPHTHALMIC OINT
TOPICAL_OINTMENT | OPHTHALMIC | Status: DC | PRN
  Filled 2019-10-23 (×3): qty 3.5

## 2019-10-23 MED ORDER — PROPOFOL 10 MG/ML IV BOLUS
INTRAVENOUS | Status: AC
  Filled 2019-10-23: qty 20

## 2019-10-23 MED ORDER — VANCOMYCIN HCL 1250 MG/250ML IV SOLN
1250.0000 mg | INTRAVENOUS | Status: DC
  Administered 2019-10-23 – 2019-10-25 (×3): 1250 mg via INTRAVENOUS
  Filled 2019-10-23 (×3): qty 250

## 2019-10-23 MED ORDER — NICARDIPINE HCL IN NACL 20-0.86 MG/200ML-% IV SOLN
3.0000 mg/h | INTRAVENOUS | Status: DC
  Administered 2019-10-23: 3 mg/h via INTRAVENOUS
  Filled 2019-10-23 (×2): qty 200

## 2019-10-23 MED ORDER — NOREPINEPHRINE 4 MG/250ML-% IV SOLN
0.0000 ug/min | INTRAVENOUS | Status: DC
  Filled 2019-10-23: qty 250

## 2019-10-23 MED ORDER — 0.9 % SODIUM CHLORIDE (POUR BTL) OPTIME
TOPICAL | Status: DC | PRN
  Administered 2019-10-23: 1000 mL
  Administered 2019-10-23: 3000 mL

## 2019-10-23 MED ORDER — POTASSIUM CHLORIDE 10 MEQ/50ML IV SOLN
10.0000 meq | INTRAVENOUS | Status: AC
  Administered 2019-10-23 (×2): 10 meq via INTRAVENOUS
  Filled 2019-10-23 (×2): qty 50

## 2019-10-23 MED ORDER — DEXTROSE 10 % IV SOLN: INTRAVENOUS | Status: DC

## 2019-10-23 MED ORDER — DEXTROSE 50 % IV SOLN
12.5000 g | INTRAVENOUS | Status: AC
  Administered 2019-10-23: 12.5 g via INTRAVENOUS

## 2019-10-23 MED ORDER — ALBUMIN HUMAN 25 % IV SOLN
12.5000 g | Freq: Four times a day (QID) | INTRAVENOUS | Status: DC
  Administered 2019-10-23 – 2019-10-26 (×11): 12.5 g via INTRAVENOUS
  Filled 2019-10-23 (×10): qty 50

## 2019-10-23 MED ORDER — FENTANYL CITRATE (PF) 100 MCG/2ML IJ SOLN
INTRAMUSCULAR | Status: DC | PRN
  Administered 2019-10-23: 50 ug via INTRAVENOUS
  Administered 2019-10-23: 100 ug via INTRAVENOUS
  Administered 2019-10-23: 150 ug via INTRAVENOUS
  Administered 2019-10-23: 100 ug via INTRAVENOUS
  Administered 2019-10-23 (×2): 50 ug via INTRAVENOUS
  Administered 2019-10-23: 100 ug via INTRAVENOUS

## 2019-10-23 MED ORDER — LACTATED RINGERS IV SOLN: INTRAVENOUS | Status: DC | PRN

## 2019-10-23 MED ORDER — FUROSEMIDE 10 MG/ML IJ SOLN
80.0000 mg | Freq: Once | INTRAMUSCULAR | Status: AC
  Administered 2019-10-23: 80 mg via INTRAVENOUS
  Filled 2019-10-23: qty 8

## 2019-10-23 MED ORDER — EPINEPHRINE HCL 5 MG/250ML IV SOLN IN NS
0.5000 ug/min | INTRAVENOUS | Status: DC
  Filled 2019-10-23: qty 250

## 2019-10-23 MED ORDER — VANCOMYCIN HCL 1000 MG IV SOLR
INTRAVENOUS | Status: DC | PRN
  Administered 2019-10-23: 1000 mg

## 2019-10-23 MED ORDER — ROCURONIUM BROMIDE 100 MG/10ML IV SOLN
INTRAVENOUS | Status: DC | PRN
  Administered 2019-10-23: 30 mg via INTRAVENOUS
  Administered 2019-10-23: 50 mg via INTRAVENOUS

## 2019-10-23 MED ORDER — MIDAZOLAM HCL 5 MG/5ML IJ SOLN
INTRAMUSCULAR | Status: DC | PRN
  Administered 2019-10-23 (×2): 2 mg via INTRAVENOUS

## 2019-10-23 MED ORDER — SODIUM CHLORIDE 0.9 % IV SOLN: INTRAVENOUS | Status: DC | PRN

## 2019-10-23 MED ORDER — VANCOMYCIN HCL 1000 MG IV SOLR
INTRAVENOUS | Status: AC
  Filled 2019-10-23: qty 1000

## 2019-10-23 MED ORDER — FUROSEMIDE 10 MG/ML IJ SOLN
12.0000 mg/h | INTRAVENOUS | Status: DC
  Administered 2019-10-23 – 2019-10-24 (×2): 10 mg/h via INTRAVENOUS
  Administered 2019-10-25 – 2019-10-26 (×2): 8 mg/h via INTRAVENOUS
  Filled 2019-10-23: qty 21
  Filled 2019-10-23: qty 5
  Filled 2019-10-23 (×2): qty 21

## 2019-10-23 MED ORDER — PROPOFOL 10 MG/ML IV BOLUS
INTRAVENOUS | Status: DC | PRN
  Administered 2019-10-23: 10 mg via INTRAVENOUS

## 2019-10-23 MED ORDER — POTASSIUM CHLORIDE 10 MEQ/50ML IV SOLN
10.0000 meq | INTRAVENOUS | Status: AC
  Administered 2019-10-23 – 2019-10-24 (×3): 10 meq via INTRAVENOUS
  Filled 2019-10-23 (×2): qty 50

## 2019-10-23 MED FILL — Mannitol IV Soln 20%: INTRAVENOUS | Qty: 500 | Status: AC

## 2019-10-23 MED FILL — Sodium Chloride IV Soln 0.9%: INTRAVENOUS | Qty: 8000 | Status: AC

## 2019-10-23 MED FILL — Lidocaine HCl Local Preservative Free (PF) Inj 2%: INTRAMUSCULAR | Qty: 15 | Status: AC

## 2019-10-23 MED FILL — Lidocaine HCl Local Preservative Free (PF) Inj 2%: INTRAMUSCULAR | Qty: 15 | Status: CN

## 2019-10-23 MED FILL — Sodium Bicarbonate IV Soln 8.4%: INTRAVENOUS | Qty: 100 | Status: AC

## 2019-10-23 MED FILL — Heparin Sodium (Porcine) Inj 1000 Unit/ML: INTRAMUSCULAR | Qty: 30 | Status: AC

## 2019-10-23 MED FILL — Heparin Sodium (Porcine) Inj 1000 Unit/ML: INTRAMUSCULAR | Qty: 30 | Status: CN

## 2019-10-23 MED FILL — Electrolyte-R (PH 7.4) Solution: INTRAVENOUS | Qty: 5000 | Status: AC

## 2019-10-23 MED FILL — Electrolyte-R (PH 7.4) Solution: INTRAVENOUS | Qty: 4000 | Status: AC

## 2019-10-23 MED FILL — Thrombin (Recombinant) For Soln 20000 Unit: CUTANEOUS | Qty: 1 | Status: AC

## 2019-10-23 MED FILL — Sodium Bicarbonate IV Soln 8.4%: INTRAVENOUS | Qty: 50 | Status: AC

## 2019-10-23 MED FILL — Heparin Sodium (Porcine) Inj 1000 Unit/ML: INTRAMUSCULAR | Qty: 10 | Status: AC

## 2019-10-23 MED FILL — Potassium Chloride Inj 2 mEq/ML: INTRAVENOUS | Qty: 40 | Status: CN

## 2019-10-23 MED FILL — Calcium Chloride Inj 10%: INTRAVENOUS | Qty: 10 | Status: AC

## 2019-10-23 MED FILL — Sodium Bicarbonate IV Soln 8.4%: INTRAVENOUS | Qty: 150 | Status: AC

## 2019-10-23 MED FILL — Potassium Chloride Inj 2 mEq/ML: INTRAVENOUS | Qty: 20 | Status: AC

## 2019-10-23 MED FILL — Sodium Chloride IV Soln 0.9%: INTRAVENOUS | Qty: 3000 | Status: AC

## 2019-10-23 MED FILL — Tranexamic Acid IV Soln 1000 MG/10ML (100 MG/ML): INTRAVENOUS | Qty: 142 | Status: CN

## 2019-10-23 MED FILL — Tranexamic Acid IV Soln 1000 MG/10ML (100 MG/ML): INTRAVENOUS | Qty: 3000 | Status: CN

## 2019-10-23 SURGICAL SUPPLY — 69 items
ANCHOR CATH FOLEY SECURE (MISCELLANEOUS) ×4 IMPLANT
BAG URIMETER BARDEX IC 350 (UROLOGICAL SUPPLIES) ×2 IMPLANT
BANDAGE ESMARK 6X9 LF (GAUZE/BANDAGES/DRESSINGS) ×1 IMPLANT
BLADE CLIPPER SURG (BLADE) ×2 IMPLANT
BLADE SURG 11 STRL SS (BLADE) IMPLANT
BNDG CMPR 9X6 STRL LF SNTH (GAUZE/BANDAGES/DRESSINGS) ×1
BNDG ESMARK 6X9 LF (GAUZE/BANDAGES/DRESSINGS) ×2
CANISTER SUCT 3000ML PPV (MISCELLANEOUS) ×2 IMPLANT
CANISTER WOUNDNEG PRESSURE 500 (CANNISTER) ×2 IMPLANT
CATH EMB 4FR 40CM (CATHETERS) ×2 IMPLANT
CATH FOLEY 2WAY SLVR  5CC 16FR (CATHETERS) ×2
CATH FOLEY 2WAY SLVR 5CC 16FR (CATHETERS) ×1 IMPLANT
CATH THORACIC 28FR (CATHETERS) IMPLANT
CATH THORACIC 28FR RT ANG (CATHETERS) IMPLANT
CLIP VESOCCLUDE SM WIDE 24/CT (CLIP) IMPLANT
CNTNR URN SCR LID CUP LEK RST (MISCELLANEOUS) ×1 IMPLANT
CONT SPEC 4OZ STRL OR WHT (MISCELLANEOUS) ×2
COVER SURGICAL LIGHT HANDLE (MISCELLANEOUS) ×4 IMPLANT
DRAIN CHANNEL 32F RND 10.7 FF (WOUND CARE) ×2 IMPLANT
DRAPE INCISE IOBAN 66X45 STRL (DRAPES) ×2 IMPLANT
DRAPE LAPAROSCOPIC ABDOMINAL (DRAPES) ×2 IMPLANT
ELECT BLADE 4.0 EZ CLEAN MEGAD (MISCELLANEOUS) ×2
ELECT REM PT RETURN 9FT ADLT (ELECTROSURGICAL) ×2
ELECTRODE BLDE 4.0 EZ CLN MEGD (MISCELLANEOUS) ×1 IMPLANT
ELECTRODE REM PT RTRN 9FT ADLT (ELECTROSURGICAL) ×1 IMPLANT
FELT TEFLON 1X6 (MISCELLANEOUS) ×2 IMPLANT
GAUZE SPONGE 4X4 12PLY STRL (GAUZE/BANDAGES/DRESSINGS) ×4 IMPLANT
GAUZE SPONGE 4X4 12PLY STRL LF (GAUZE/BANDAGES/DRESSINGS) ×2 IMPLANT
GLOVE BIO SURGEON STRL SZ 6.5 (GLOVE) ×4 IMPLANT
GLOVE BIO SURGEON STRL SZ7.5 (GLOVE) ×2 IMPLANT
GLOVE BIOGEL PI IND STRL 6 (GLOVE) ×2 IMPLANT
GLOVE BIOGEL PI IND STRL 6.5 (GLOVE) ×1 IMPLANT
GLOVE BIOGEL PI INDICATOR 6 (GLOVE) ×2
GLOVE BIOGEL PI INDICATOR 6.5 (GLOVE) ×1
GLOVE ORTHO TXT STRL SZ7.5 (GLOVE) ×6 IMPLANT
GOWN STRL REUS W/ TWL LRG LVL3 (GOWN DISPOSABLE) ×9 IMPLANT
GOWN STRL REUS W/TWL LRG LVL3 (GOWN DISPOSABLE) ×18
HEMOSTAT POWDER SURGIFOAM 1G (HEMOSTASIS) ×4 IMPLANT
KIT BASIN OR (CUSTOM PROCEDURE TRAY) ×2 IMPLANT
KIT SUCTION CATH 14FR (SUCTIONS) ×2 IMPLANT
KIT TURNOVER KIT B (KITS) ×2 IMPLANT
NS IRRIG 1000ML POUR BTL (IV SOLUTION) ×12 IMPLANT
PACK CHEST (CUSTOM PROCEDURE TRAY) ×2 IMPLANT
PACK OPEN HEART (CUSTOM PROCEDURE TRAY) ×2 IMPLANT
PAD ARMBOARD 7.5X6 YLW CONV (MISCELLANEOUS) ×4 IMPLANT
SET IRRIG TUBING LAPAROSCOPIC (IRRIGATION / IRRIGATOR) ×2 IMPLANT
SPONGE ABDOMINAL VAC ABTHERA (MISCELLANEOUS) ×2 IMPLANT
SUT PROLENE 2 0 MH 48 (SUTURE) ×10 IMPLANT
SUT PROLENE 3 0 SH DA (SUTURE) ×2 IMPLANT
SUT PROLENE 4 0 RB 1 (SUTURE) ×2
SUT PROLENE 4-0 RB1 .5 CRCL 36 (SUTURE) ×1 IMPLANT
SUT PROLENE 5 0 C 1 36 (SUTURE) ×2 IMPLANT
SUT SILK 1 TIES 10X30 (SUTURE) ×2 IMPLANT
SUT SILK 2 0 SH CR/8 (SUTURE) ×4 IMPLANT
SUT SILK 2 0 TIES 10X30 (SUTURE) ×2 IMPLANT
SUT VIC AB 1 CTX 27 (SUTURE) ×4 IMPLANT
SUT VIC AB 1 CTX 36 (SUTURE) ×2
SUT VIC AB 1 CTX36XBRD ANBCTR (SUTURE) ×1 IMPLANT
SUT VIC AB 2 TP1 27 (SUTURE) ×2 IMPLANT
SUT VIC AB 2-0 CTX 36 (SUTURE) ×8 IMPLANT
SUT VIC AB 3-0 X1 27 (SUTURE) ×4 IMPLANT
SWAB CULTURE LIQ STUART DBL (MISCELLANEOUS) ×2 IMPLANT
SWAB CULTURE LIQUID MINI MALE (MISCELLANEOUS) ×2 IMPLANT
SYR TB 1ML LUER SLIP (SYRINGE) ×2 IMPLANT
SYSTEM SAHARA CHEST DRAIN ATS (WOUND CARE) ×2 IMPLANT
TOWEL GREEN STERILE (TOWEL DISPOSABLE) ×4 IMPLANT
TOWEL GREEN STERILE FF (TOWEL DISPOSABLE) ×2 IMPLANT
TUBE CONNECTING 20X1/4 (TUBING) ×4 IMPLANT
WATER STERILE IRR 1000ML POUR (IV SOLUTION) ×4 IMPLANT

## 2019-10-23 NOTE — Plan of Care (Signed)
  Problem: Cardiac: Goal: Will achieve and/or maintain hemodynamic stability Outcome: Progressing  Chest tube output decreasing Problem: Respiratory: Goal: Respiratory status will improve Outcome: Progressing  Cardiopulmonary maintained by vent and ECMO

## 2019-10-23 NOTE — Progress Notes (Signed)
Initial Nutrition Assessment  DOCUMENTATION CODES:   Not applicable  INTERVENTION:   Tube Feeding:  Pivot 1.5 at 65 ml/hr  Begin at 20 ml/hr; titrate by 10 mL q 8 hours until goal rate of 65 ml/hr Provides 146 g of protein, 2340 kcals and 1003 mL of free water   NUTRITION DIAGNOSIS:   Inadequate oral intake related to inability to eat, acute illness as evidenced by NPO status.  GOAL:   Patient will meet greater than or equal to 90% of their needs  MONITOR:   Vent status, Skin, TF tolerance, Weight trends, Labs, I & O's  REASON FOR ASSESSMENT:   Consult, Ventilator Assessment of nutrition requirement/status  ASSESSMENT:   49 yo female admitted with severe TR with TV repair on 4/7, pt with significant postop bleeding requiring return to OR for median sternotomy and reexploration and initiation of VA ECMO. 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 Off cleviprex. Requiring neosynephrine MV: 4.1 L/min Temp (24hrs), Avg:97.9 F (36.6 C), Min:97.3 F (36.3 C), Max:98.1 F (36.7 C)  OG tube in place. Per chest xray, "OG tube appears stable"  Spoke with bedside RN; no plan for Cortrak at this time, plan to utilize OG tube. Given pt return trip to OR, plan to hopefully initiate feedings by   Unable to obtain diet and weight history from patient at this time Admit weight 71 kg; current wt 99 kg. Plan to utilize 71 kg as EDW. Pt significantly net positive given multiple trips to OR, net positive 20 L since  admission per I/O flow sheet  Minimal UOP, Chest tube drainage has decreased , no output from wound vac UOP: 55 mL Chest tube output: 8L in previous 24 hours, only 280 thus far today Wound Vac: 0 mL  No depletions noted on nutrition-focused physical exam (NFPE); however, pt with significant non-pitting edema in all areas which would mask any muscle wasting or subcutaneous fat loss. Plan to follow-up and repeat NFPE  Labs: sodium 150 (H),  Creatinine 2.10, BUN 24, potassium 4.0 (wdl) Meds: D5-1/2 NS at 75 ml/hr started today    NUTRITION - FOCUSED PHYSICAL EXAM:    Most Recent Value  Orbital Region  No depletion  Upper Arm Region  No depletion  Thoracic and Lumbar Region  No depletion  Buccal Region  Unable to assess  Temple Region  No depletion  Clavicle Bone Region  No depletion  Clavicle and Acromion Bone Region  No depletion  Scapular Bone Region  No depletion  Dorsal Hand  No depletion  Patellar Region  No depletion  Anterior Thigh Region  No depletion  Posterior Calf Region  Unable to assess  Edema (RD Assessment)  Severe       Diet Order:   Diet Order            Diet NPO time specified  Diet effective now              EDUCATION NEEDS:   Not appropriate for education at this time  Skin:  Skin Assessment: Skin Integrity Issues: Skin Integrity Issues:: Wound VAC Wound  Vac: Chest  Last BM:  4/09  Height:   Ht Readings from Last 1 Encounters:  10/29/2019 5\' 7"  (1.702 m)    Weight:   Wt Readings from Last 1 Encounters:  10/31/2019 99 kg    BMI:  Body mass index is 34.18 kg/m.  Estimated Nutritional Needs:   Kcal:  1920-2130 kcals  Protein:  107-142 g  Fluid:  1.7 L    Kerman Passey MS, RDN, LDN, CNSC RD Pager Number and Weekend/On-Call After Hours Pager Located in [place]

## 2019-10-23 NOTE — Progress Notes (Signed)
    ADVANCED HF/SHOCK TEAM NOTE   Called to OR by Dr. Roxy Manns to assess for possible weaning of ECMO circuit.  Patient back to OR today for repeat chest washout with removal of massive amount of clot from right hemothorax.   With removal of clot there was marked improvement in hemodynamics especially RV function.  There was no evidence of significant ongoing chest bleeding.   TEE probe placed by Dr. Glennon Mac   LVEF 40-45% with mild to moderate RV dysfunction. TVR looked good with mid residual TR.  ECMO flow turned down to 2L and patient tolerated well with stable vitals and oxygenation. CVP remained 12.   Given marked volume overload and need to leave chest open, decision was made to leave patient cannulated over the weekend to support diuresis.   Wil cotninue to hold heparin until tomorrow am and watch circuit closely.   Diurese over weekend and hopefully return to OR on Monday for ECMO decannualtion and chest closure.   Additional 59mins CCT.   Glori Bickers, MD  5:57 PM

## 2019-10-23 NOTE — Progress Notes (Signed)
Pharmacy Antibiotic Note  [name] is a 49 y.o. female admitted on 10/16/2019 with TV repair. Postop course has been complicated by hemorrhagic shock and VA ECMO was initiated later on 4/7. Back to OR for re-exploration on 4/8 and plan to take back on 4/9 for washout. Patient remains with open chest. She received vancomycin and levofloxacin for perioperative antibiotics. Pharmacy has been consulted for vancomycin and meropenem dosing while chest is still open.  Plan: Vancomycin 1250 IV every 24 hours.  Goal trough 15-20 mcg/mL.  Meropenem 2 g IV q12h Check vancomycin trough this weekend Monitor renal function, surgical plans, length of therapy, clinical status  Height: 5\' 7"  (170.2 cm) Weight: 99 kg (218 lb 4.1 oz) IBW/kg (Calculated) : 61.6  Temp (24hrs), Avg:97.9 F (36.6 C), Min:97.3 F (36.3 C), Max:98.1 F (36.7 C)  Recent Labs  Lab 11/08/2019 0636 10/29/2019 0956 10/17/2019 1107 10/24/2019 1107 11/12/2019 1632 11/11/2019 1632 11/01/2019 1833 10/19/2019 1838 11/11/2019 1843 11/09/2019 2202 11/11/2019 0512 11/13/2019 0735 11/09/2019 1125  WBC   < >  --  1.0*  --  2.2*  --   --   --  2.7* 2.9* 3.6*  --   --   CREATININE   < >  --  1.42*   < > 1.36*   < > 1.40*  --   --  1.78* 2.04* 2.10* 2.00*  LATICACIDVEN  --  3.0*  --   --   --   --   --  2.3*  --   --  1.6  --   --    < > = values in this interval not displayed.    Estimated Creatinine Clearance: 42.5 mL/min (A) (by C-G formula based on SCr of 2 mg/dL (H)).    Allergies  Allergen Reactions  . Lisinopril     angioedema  . Penicillins Hives    DID THE REACTION INVOLVE: Swelling of the face/tongue/throat, SOB, or low BP? Unknown Sudden or severe rash/hives, skin peeling, or the inside of the mouth or nose? Unknown Did it require medical treatment? Unknown When did it last happen?age 53 If all above answers are "NO", may proceed with cephalosporin use.     Antimicrobials this admission: Vancomycin 4/7 >> Levofloxacin 4/7  >> 4/8 Meropenem 4/9 >>  Dose adjustments this admission: None  Microbiology results: None  Thank you for allowing pharmacy to be a part of this patient's care.  Vertis Kelch, PharmD, BCPS PGY2 Cardiology Pharmacy Resident Phone [phone] 10/30/2019       12:11 PM  Please check [email or web address] for unit-specific pharmacist phone numbers

## 2019-10-23 NOTE — Progress Notes (Signed)
Transported patient back from OR on 100% FIO2 and Nitric in line.  Tolerated well.

## 2019-10-23 NOTE — OR Nursing (Signed)
1448 - Removal of 5 4 x 18 lap sponges on 10/27/2019 @ 1548 by Dr. Ivin Poot, MD.

## 2019-10-23 NOTE — Progress Notes (Signed)
Pt transferred from OR 14 to 2H03 by [facility] specialist, OR staff, Anesthesia and respiratory. Flows, oxygenation and vitals stable. No adverse events during transport. Emergency equipment available. Pt Cardiohelp reconnected to wall a/c and oxygen connected to wall.

## 2019-10-23 NOTE — Anesthesia Postprocedure Evaluation (Signed)
Anesthesia Post Note  Patient: [name]  Procedure(s) Performed: MINIMALLY INVASIVE TRICUSPID VALVE REPAIR USING MC3 TRICUSPID ANNULOPLASTY RING (Right Chest) TRANSESOPHAGEAL ECHOCARDIOGRAM (TEE) (N/A )     Patient location during evaluation: SICU Anesthesia Type: General Level of consciousness: sedated Pain management: pain level controlled Vital Signs Assessment: post-procedure vital signs reviewed and stable Respiratory status: patient remains intubated per anesthesia plan Cardiovascular status: stable Postop Assessment: no apparent nausea or vomiting Anesthetic complications: no Comments: Pt is coagulopathic based on surgical inspection and TEG results.  Treatment with FFP, PLTS, and cryoprecipitate ongoing.    Last Vitals:  Vitals:   11/11/2019 2130 10/15/2019 2145  BP:    Pulse:    Resp: (!) 0 (!) 0  Temp: 36.8 C 36.7 C  SpO2: 100% 95%    Last Pain:  Vitals:   10/25/2019 1745  TempSrc: Core  PainSc:                  Khush Pasion S

## 2019-10-23 NOTE — Anesthesia Preprocedure Evaluation (Signed)
Anesthesia Evaluation  Patient identified by MRN, date of birth, ID band Patient unresponsive    Reviewed: Patient's Chart, lab work & pertinent test results, Unable to perform ROS - Chart review only  Airway Mallampati: Intubated       Dental   Pulmonary     + decreased breath sounds      Cardiovascular hypertension,  Rhythm:Regular Rate:Tachycardia     Neuro/Psych    GI/Hepatic   Endo/Other  diabetes  Renal/GU      Musculoskeletal   Abdominal   Peds  Hematology   Anesthesia Other Findings   Reproductive/Obstetrics                             Anesthesia Physical Anesthesia Plan  ASA: IV  Anesthesia Plan: General   Post-op Pain Management:    Induction: Intravenous  PONV Risk Score and Plan:   Airway Management Planned: Oral ETT  Additional Equipment: Arterial line and CVP  Intra-op Plan:   Post-operative Plan: Post-operative intubation/ventilation  Informed Consent: I have reviewed the patients History and Physical, chart, labs and discussed the procedure including the risks, benefits and alternatives for the proposed anesthesia with the patient or authorized representative who has indicated his/her understanding and acceptance.       Plan Discussed with: CRNA and Anesthesiologist  Anesthesia Plan Comments:         Anesthesia Quick Evaluation

## 2019-10-23 NOTE — Progress Notes (Signed)
Advanced Heart Failure Rounding Note  PCP-Cardiologist: No primary care provider on file.   Subjective:      Events: - 4/7 Underwent mini TV repair c/b diffuse coagulopathy and severe bleeding.  Received RBCS, PLTs, TXA, cryo, Factor 7, steroids - 4/7 take back to OR for bleeding and VA ECMO placement  - 4/8 Repeat take back to OR for washout  Remains on ECMO and vent. On Neo 10. Bleeding has slowed considerably. Received 1u RBC, 1 pack platelets and albumin overnight  Hgb 8.0 this am. PLTs 64k Sedated on Precedex.   Vent at 40% PEEP 5  Weight up 70 pounds. Making 15-20cc urine/hour  CXR with severe R lung opacities.   Objective:   Weight Range: 99 kg Body mass index is 34.18 kg/m.   Vital Signs:   Temp:  [97.3 F (36.3 C)-98.4 F (36.9 C)] 98.1 F (36.7 C) (04/09 0800) Pulse Rate:  [75-87] 80 (04/09 0308) Resp:  [0-14] 0 (04/09 0800) BP: (73-118)/(43-94) 83/71 (04/09 0700) SpO2:  [99 %-100 %] 100 % (04/09 0800) Arterial Line BP: (60-228)/(51-220) 72/58 (04/09 0800) FiO2 (%):  [40 %-50 %] 40 % (04/09 0735) Weight:  [99 kg] 99 kg (04/09 0615)    Weight change: Filed Weights   10/27/2019 0700 11/05/2019 0615  Weight: 71 kg 99 kg    Intake/Output:   Intake/Output Summary (Last 24 hours) at 11/01/2019 0801 Last data filed at 10/27/2019 0700 Gross per 24 hour  Intake 10785.5 ml  Output 8185 ml  Net 2600.5 ml      Physical Exam    General:  Critically ill appearing on vent and ecmo  HEENT: normal + ETT Neck: supple. RIJ swan Cor: PMI nondisplaced. Regular rate & rhythm. No rubs, gallops or murmurs. Wound vac on chest. Multiple CTs ECMO cannulas Lungs: coarse Abdomen: obese soft, nontender, nondistended. No hepatosplenomegaly. No bruits or masses. Hypoactive bowel sounds. Extremities: no cyanosis, clubbing, rash, 2+ edema Neuro:sedated on vent    Telemetry   Apaced 80s -> sinus 75 underneath  Personally reviewed   Labs    CBC Recent Labs   11/12/2019 1749 11/05/2019 2228 10/18/2019 2202 11/03/2019 2209 11/03/2019 0512 10/27/2019 0524 11/04/2019 0729 11/01/2019 0735  WBC 3.0*   < > 2.9*  --  3.6*  --   --   --   NEUTROABS 2.5  --   --   --   --   --   --   --   HGB 7.7*   < > 8.0*   < > 8.0*   < > 7.5* 8.2*  HCT 23.3*   < > 22.7*   < > 22.6*   < > 22.0* 24.0*  MCV 86.6   < > 83.2  --  83.1  --   --   --   PLT 77*   < > 61*  --  64*  --   --   --    < > = values in this interval not displayed.   Basic Metabolic Panel Recent Labs    10/28/2019 0334 11/09/2019 0636 11/03/2019 1632 10/18/2019 1633 11/08/2019 2202 10/30/2019 2209 11/09/2019 0512 11/11/2019 0524 11/02/2019 0729 10/25/2019 0735  NA 149*   < > 152*   < > 150*   < > 152*   < > 151* 150*  K 4.5   < > 3.7   < > 4.2   < > 4.1   < > 4.0 4.0  CL 118*   < >  120*   < > 113*   < > 115*  --   --  112*  CO2 17*   < > 23   < > 25  --  25  --   --   --   GLUCOSE 168*   < > 126*   < > 109*   < > 74  --   --  82  BUN 13   < > 15   < > 20   < > 24*  --   --  24*  CREATININE 1.16*   < > 1.36*   < > 1.78*   < > 2.04*  --   --  2.10*  CALCIUM 5.6*   < > 6.3*   < > 7.4*  --  7.4*  --   --   --   MG 1.3*  --  1.6*  --   --   --   --   --   --   --    < > = values in this interval not displayed.   Liver Function Tests Recent Labs    10/18/2019 1107 11/06/2019 0512  AST 28 242*  ALT 13 116*  ALKPHOS 25* 25*  BILITOT 0.7 1.3*  PROT 3.1* 3.4*  ALBUMIN 1.9* 2.4*   No results for input(s): LIPASE, AMYLASE in the last 72 hours. Cardiac Enzymes No results for input(s): CKTOTAL, CKMB, CKMBINDEX, TROPONINI in the last 72 hours.  BNP: BNP (last 3 results) Recent Labs    04/15/19 1101  BNP 248.7*    ProBNP (last 3 results) No results for input(s): PROBNP in the last 8760 hours.   D-Dimer No results for input(s): DDIMER in the last 72 hours. Hemoglobin A1C No results for input(s): HGBA1C in the last 72 hours. Fasting Lipid Panel No results for input(s): CHOL, HDL, LDLCALC, TRIG, CHOLHDL,  LDLDIRECT in the last 72 hours. Thyroid Function Tests No results for input(s): TSH, T4TOTAL, T3FREE, THYROIDAB in the last 72 hours.  Invalid input(s): FREET3  Other results:   Imaging    No results found.   Medications:     Scheduled Medications: . sodium chloride   Intravenous Once  . sodium chloride   Intravenous Once  . sodium chloride   Intravenous Once  . sodium chloride   Intravenous Once  . sodium chloride   Intravenous Once  . chlorhexidine gluconate (MEDLINE KIT)  15 mL Mouth Rinse BID  . Chlorhexidine Gluconate Cloth  6 each Topical Daily  . Chlorhexidine Gluconate Cloth  6 each Topical Daily  . docusate sodium  200 mg Oral Daily  . levothyroxine  50 mcg Oral Q0600  . mouth rinse  15 mL Mouth Rinse 10 times per day  . pantoprazole (PROTONIX) IV  40 mg Intravenous QHS  . sodium chloride flush  10-40 mL Intracatheter Q12H  . sodium chloride flush  3 mL Intravenous Q12H    Infusions: . sodium chloride 75 mL/hr at 11/01/2019 0700  . sodium chloride 10 mL/hr at 10/29/2019 0700  . albumin human 12.5 g (10/29/2019 2127)  . dexmedetomidine (PRECEDEX) IV infusion 0.8 mcg/kg/hr (10/16/2019 0700)  . dextrose 5 % and 0.45% NaCl    . epinephrine Stopped (10/28/2019 0505)  . insulin Stopped (11/04/2019 2223)  . lactated ringers    . lactated ringers    . lactated ringers 100 mL/hr at 10/19/2019 0558  . milrinone 0.5 mcg/kg/min (11/05/2019 0700)  . niCARDipine    . nitroGLYCERIN    .  norepinephrine (LEVOPHED) Adult infusion    . phenylephrine (NEO-SYNEPHRINE) Adult infusion 10 mcg/min (10/20/2019 0700)  . vancomycin      PRN Medications: sodium chloride, albumin human, dextrose, lactated ringers, metoprolol tartrate, midazolam, morphine injection, ondansetron (ZOFRAN) IV, oxyCODONE, sodium chloride flush, sodium chloride flush, traMADol     Assessment/Plan   1. Post-cardiotomy hemorrhagic shock - Patient with refractory post-operative chest bleeding s/p minimally invasive TV  repair on 10/30/2019 in setting of longstanding SLE - has received  RBCS, PLTs, TXA, cryo, Factor 7, steroids - now on ECMO supoort - Bleeding has slowed - Continue to replace products as needed - D/w Dr. Roxy Manns. Back to OR today for repeat washout - No heparin yet. Consider low-dose heparin for ECMO circuit later today or in am  2. Severe TR - s/p mini-TV repair 4/7 - plan as above  3. Post-operative respiratory failure - remains intubated  - Progressive airspace disease in R lung after mini thoracotomy. Hopefully will improve with chest washout today. Keep R side up as much as possible - consult CCM to help  4. Acute blood loss anemia - as above - continue to replace as needed  5. Thrombocytopenia - Due to shock/coaguloapthy - now stabilizing  6. Facial twitching - consider EEG for not improving  7. SLE - no change  CRITICAL CARE Performed by: Glori Bickers  Total critical care time: 45 minutes  Critical care time was exclusive of separately billable procedures and treating other patients.  Critical care was necessary to treat or prevent imminent or life-threatening deterioration.  Critical care was time spent personally by me (independent of midlevel providers or residents) on the following activities: development of treatment plan with patient and/or surrogate as well as nursing, discussions with consultants, evaluation of patient's response to treatment, examination of patient, obtaining history from patient or surrogate, ordering and performing treatments and interventions, ordering and review of laboratory studies, ordering and review of radiographic studies, pulse oximetry and re-evaluation of patient's condition.    Length of Stay: 2  Glori Bickers, MD  10/29/2019, 8:01 AM  Advanced Heart Failure Team Pager [phone] (M-F; 7a - 4p)  Please contact [facility] for night-coverage after hours (4p -7a ) and weekends on [email or web address]

## 2019-10-23 NOTE — Progress Notes (Signed)
Pt transferred to OR for scheduled procedure at 1345 by ECMO specialist, anesthesia and profusion. Emergency equipment checked and accessible prior to leaving room. Pt flow and pressures stable. Back-up circuit brought to OR to have on stand-by.

## 2019-10-23 NOTE — Transfer of Care (Signed)
Immediate Anesthesia Transfer of Care Note  Patient: [name]  Procedure(s) Performed: Mediastinal washout and Re-exploration of right chest for evacuation of hemothorax (N/A Chest) Application Of Wound Vac using Abthera dressing. (N/A Chest)  Patient Location: ICU  Anesthesia Type:General  Level of Consciousness: sedated and Patient remains intubated per anesthesia plan  Airway & Oxygen Therapy: Patient remains intubated per anesthesia plan and Patient placed on Ventilator (see vital sign flow sheet for setting)  Post-op Assessment: Report given to RN and Post -op Vital signs reviewed and stable  Post vital signs: Reviewed and stable  Last Vitals:  Vitals Value Taken Time  BP    Temp    Pulse    Resp    SpO2      Last Pain:  Vitals:   10/20/2019 1745  TempSrc: Core  PainSc:          Complications: No apparent anesthesia complications   Transported with MDA/RRT/Echmo.  Tolerated transport well.   VS remained stable.  MAP around 70 during transport.  Bag mask ventilation provided with nitric at 20ppm.

## 2019-10-23 NOTE — Progress Notes (Signed)
Referral received. Patient has Freestyle Libre glucose monitor on back of Left arm from outpatient glucose monitoring.  Removed sensor and placed in sharps container.   Thanks,  Adah Perl, RN, BC-ADM Inpatient Diabetes Coordinator Pager [phone] (8a-5p)

## 2019-10-23 NOTE — CV Procedure (Signed)
ECMO NOTE:  Indication: Hemorrhagic shock post TV repai  Initial cannulation date: 11/04/2019  ECMO type: VVA ECMO (Cardio-Help)  Drainage: RA cannulation Return: Asc aorta   ECMO events:  Events: - 4/7 Underwent mini TV repair c/b diffuse coagulopathy and severe bleeding.  Received RBCS, PLTs, TXA, cryo, Factor 7, steroids - 4/7 - 4/8 take back to OR for bleeding and VA ECMO placement  - 4/8 Repeat take back to OR for washout and repacking  Daily data:   Flow 3.26 RPM 3000 dP 13 Pven -19 Sweep 2 SVO2 77%  Labs:   Hgb8.2 LDH 449 Heparin level: off Lactic acid 0.9   Plan:  Remains on ECMO Bleeding has slowed Hemodynamics stable Back to OR today for washout Start lasix and heparin post-op when able  Discussed in multidisciplinary ECMO rounds at the bedside.   Glori Bickers, MD  8:40 AM

## 2019-10-23 NOTE — Op Note (Addendum)
CARDIOTHORACIC SURGERY OPERATIVE NOTE  Date of Procedure:   10/17/2019  Preoperative Diagnosis:  Right Hemothorax on ECMO Support  Postoperative Diagnosis:  same  Procedure:      Mediastinal Wash-out  Re-exploration of Right Chest for Evacuation of Hemothorax  Surgeon:    Valentina Gu. Roxy Manns, MD  Assistant:    Ivin Poot, MD  Anesthesia:    Midge Minium, MD  Operative Findings:   Massive right hemothorax with no obvious active bleeding  Stable hemodynamics with low normal LV function and improved RV function      DETAILS OF THE OPERATIVE PROCEDURE  The patient is brought to the operating room on the above mentioned date and placed in the supine position on the operating table.  General endotracheal anesthesia is conducted under the maintenance and surveillance of Dr. Annye Asa and Dr. Roberts Gaudy.  Intravenous antibiotics were administered.  The existing wound VAC is removed and the patient's anterior chest, right chest, and upper abdomen are prepared and draped in a sterile manner.  A timeout procedure was performed.  The existing Esmarch dressing on the anterior mediastinum is removed.  There is a modest amount of clot in the mediastinum which is removed.  All the cannulas are inspected and are notably stable.  The mediastinum is irrigated with copious saline solution.  The patient's previous right thoracotomy incision is reopened.  There is a moderate to large size hematoma in the subcutaneous tissues and beneath the pectoralis major muscle anterior to the chest wall which is evacuated.  The pleural space is reopened.  At this juncture ventilation is temporarily discontinued while the patient remains on full ECMO support.  Exploration of the right chest reveals massive right hemothorax with a very large amount of old clotted blood and liquid blood.  All of this is evacuated carefully.  The right chest is irrigated with copious warm saline solution.  The right  chest is inspected and there is no obvious sign of active bleeding.  All of the existing chest tubes are cleared of any residual clot.  The patient's right thoracotomy incision is reclosed in multiple layers.  Ventilation is resumed.  The skin incision is closed with interrupted vertical mattress Prolene suture.  Intraoperative transesophageal echocardiogram was performed by Dr. Glennon Mac and intraoperative consultation with [name] is performed while ECMO flows are temporarily weaned.  During weaning the patient remains remarkably stable.  TEE reveals low normal left ventricular systolic function with considerably improved right ventricular function and intact tricuspid valve repair.  After discussion regarding whether or not to attempt to completely wean separation from ECMO support a plan is made to continue ECMO support for at least another day or 2 to facilitate diuresis to treat the patient's massive volume overload and optimize conditions.  Due to the raw surface area of the interior of the right chest plan is made to continue to hold on systemic anticoagulation understanding small risk of ECMO circuit thrombosis.  A new Esmarch dressing is sewn to cover the anterior mediastinum.  Wound VAC is applied.  The patient tolerated the procedure well and is transported back to the cardiac intensive care unit in stable condition on full ECMO support.     Valentina Gu. Roxy Manns MD 10/30/2019 5:21 PM

## 2019-10-23 NOTE — Progress Notes (Addendum)
[address]       [place] [address]             [phone]        CARDIOTHORACIC SURGERY PROGRESS NOTE  2 Days Post-op  S/P Procedure(s) (LRB): MINIMALLY INVASIVE TRICUSPID VALVE REPAIR USING MC3 TRICUSPID ANNULOPLASTY RING (Right) TRANSESOPHAGEAL ECHOCARDIOGRAM (TEE) (N/A)   2 Days Post-Op  S/P Procedure(s) (LRB): Re-exploration of post-operative open heart procedure for bleeding (N/A) Cannulation For Ecmo (Extracorporeal Membrane Oxygenation) (N/A)    1 Day Post-Op Procedure(s) (LRB): EXPLORATION POST OPERATIVE OPEN HEART (N/A) Transesophageal Echocardiogram (Tee) (N/A) Application Of Wound Vac (N/A)  Subjective: Sedated on vent.  Stable overnight.  Objective: Vital signs: BP Readings from Last 1 Encounters:  10/31/2019 (!) 83/71   Pulse Readings from Last 1 Encounters:  11/04/2019 80   Resp Readings from Last 1 Encounters:  10/15/2019 (!) 0   Temp Readings from Last 1 Encounters:  11/05/2019 98.1 F (36.7 C)    Hemodynamics: CVP:  [8 mmHg-23 mmHg] 8 mmHg  Physical Exam:  Rhythm:   sinus  Breath sounds: diminished  Heart sounds:  distant  Incisions:  Wound VAC intact  Abdomen:  Soft, mildly-distended  Extremities:  Warm, well-perfused, swollen  Chest tubes:  low volume thin serosanguinous output, no air leak   Intake/Output from previous day: 04/08 0701 - 04/09 0700 In: 10825.4 [I.V.:1936.3; Blood:7793.1; NG/GT:30; IV Piggyback:1066] Out: 9055 [Urine:585; Emesis/NG output:100; Blood:400; Chest Tube:7970] Intake/Output this shift: No intake/output data recorded.  Lab Results:  CBC: Recent Labs    11/09/2019 2202 11/09/2019 2209 11/06/2019 0512 10/25/2019 0524 11/11/2019 0729 10/17/2019 0735  WBC 2.9*  --  3.6*  --   --   --   HGB 8.0*   < > 8.0*   < > 7.5* 8.2*  HCT 22.7*   < > 22.6*   < > 22.0* 24.0*  PLT 61*  --  64*  --   --   --    < > = values in this interval not displayed.    BMET:  Recent Labs    11/01/2019 2202  11/05/2019 2209 10/28/2019 0512 10/20/2019 0524 10/29/2019 0729 11/08/2019 0735  NA 150*   < > 152*   < > 151* 150*  K 4.2   < > 4.1   < > 4.0 4.0  CL 113*   < > 115*  --   --  112*  CO2 25  --  25  --   --   --   GLUCOSE 109*   < > 74  --   --  82  BUN 20   < > 24*  --   --  24*  CREATININE 1.78*   < > 2.04*  --   --  2.10*  CALCIUM 7.4*  --  7.4*  --   --   --    < > = values in this interval not displayed.     PT/INR:   Recent Labs    10/20/2019 0512  LABPROT 17.4*  INR 1.4*    CBG (last 3)  Recent Labs    11/01/2019 0421 10/17/2019 0525 11/05/2019 0611  GLUCAP 74 70 84    ABG    Component Value Date/Time   PHART 7.510 (H) 10/17/2019 0729   PCO2ART 38.3 10/16/2019 0729   PO2ART 414.0 (H) 10/25/2019 0729   HCO3 30.7 (H) 10/28/2019 0729   TCO2 32 11/12/2019 0735   ACIDBASEDEF 1.0 10/24/2019 1708  O2SAT 100.0 11/07/2019 0729    CXR: Increased opacity right chest c/w hemothorax  Assessment/Plan:  Clinically stable at present on ECMO support ECMO flows briefly down overnight due to competitive suction from wound VAC - resolved by decreasing wound VAC settings NSR w/ stable flows 3.0-3.5 L/min CVP 13 O2 sats 100% w/ PaO2 > 400 UOP marginal but adequate, creatinine stable 2.1 Chest tube output low, Hgb down slightly overnight CXR w/ increased opacity right chest c/w likely hemothorax   Continue present course  Plan return to OR this afternoon for mediastinal washout and exploration right chest for evacuation of hemothorax   Rexene Alberts, MD 10/16/2019 8:02 AM   I attempted to contact patient's husband Herbie Baltimore via telephone to give him and update and discuss plans for return to OR later today.  No answer.  Will try again later.  Rexene Alberts, MD 10/27/2019 9:01 AM

## 2019-10-23 NOTE — Progress Notes (Signed)
T[facility] BRIEF SICU PROGRESS NOTE  Day of Surgery  S/P Procedure(s) (LRB): Mediastinal washout and Re-exploration of right chest for evacuation of hemothorax (N/A)   I spoke with patient's husband Herbie Baltimore over the telephone and provided update on her condition and reasons for plan to return to OR this afternoon.  All questions answered.   Rexene Alberts, MD 11/11/2019 10:28 AM

## 2019-10-23 NOTE — Consult Note (Signed)
NAME:  [name], MRN:  [id number], DOB:  1971-01-15, LOS: 2 ADMISSION DATE:  10/27/2019, CONSULTATION DATE:  11/02/2019  REFERRING MD:  Rexene Alberts, MD, CHIEF COMPLAINT:  Vent management.  History of present illness   The patient is a 49 year old woman who is postoperative day 2 from minimally invasive tricuspid valve repair which was then converted into an open procedure.  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  However despite echocardiograms showing severely elevated RA pressures, she has had 2 right heart cath that demonstrated normal peripheral vascular resistance for Group 1 pulmonary arterial hypertension.  She was taken to the OR by Dr. Roxy Manns on 4/7 given her persistent symptoms for repair of severe tricuspid regurgitation.  4/8, she had reexploration of her median sternotomy with concern for postoperative bleeding.  There was a large amount of clot evacuated from the anterior mediastinum and significant oozing diffusely.  Does not appear that there were any significant signs for mechanical active bleeding.  Bili at that time she was cannulated for ECMO and started on [place] ECMO on 4/8.  Since then she has had substantial correction of her coagulopathy including Kcentra, FEIBA, cryoprecipitate, FFP and copious blood products given her substantial bleeding in the mediastinum.  She has also been started on inhaled nitric oxide for her component of right ventricular heart failure as well as    PCCM is being consulted to assist with vent management.  On discussion with nursing heart failure team this morning, the patient's bleeding seems to have stabilized.  Chest x-ray this morning reveals white of the right lung which is concerning for blood which may  have tamponaded and so the plan is for repeat OR exploration this afternoon.  She is on 10 of Neo-Synephrine and 5 of milrinone.  She is on 20 of inhaled nitric oxide.  She is on 0.5 Precedex for sedation and is not purposefully moving.  Some minimal urine output. Her ECMO settings this morning are notable for a flow of 3.2 LPM, 3000 RPM, sweep is 3.    Her femoral arterial ABG 7.5 PCO2 38 PO2 414.  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   Consults:  Heart failure, PCCM  Procedures:  4/7 tricuspid valve repair through median sternotomy 4/8 reexploration of the mediastinum with cannulation for ECMO   Significant Diagnostic Tests:  Transesophageal echocardiogram January 2021  1. Left ventricular ejection fraction, by visual estimation, is 60 to  65%. The left ventricle has normal function. There is no left ventricular  hypertrophy.  2. The left ventricle has no regional wall motion abnormalities.  3. Global right ventricle has mildly reduced systolic function.The right  ventricular size is normal. No increase in right ventricular wall  thickness.  4. Left atrial size was moderately dilated.  5. Right atrial size was severely dilated.  6. The mitral valve is normal in structure. Trivial mitral valve  regurgitation.  7. The tricuspid valve is abnormal.  8. The tricuspid valve is abnormal. Tricuspid valve regurgitation is  severe.  9. Apparent mild restriction of  septal leaflet. RVSP ~ 58mmHG.  10. The aortic valve is normal in structure. Aortic valve regurgitation is  not visualized.  11. The pulmonic valve was  grossly normal. Pulmonic valve regurgitation is  not visualized.  12. Mild plaque invoving the descending aorta.  13. Severely elevated pulmonary artery systolic pressure.   Right heart catheterization June 2020 Findings:  RA = 5 RV = 34/7 PA =  34/6 (20) PCW = 8 Fick cardiac output/index = 5.9/3.1 PVR = 2.0 WU Ao sat = 100% PA sat = 71%, 72% SVC sat = 66%  Assessment: 1. Minimally elevated PA pressures with normal PVR and cardiac output 2. No evidence of significant intracardiac shunt  Micro Data:    Antimicrobials:    Objective   Blood pressure (!) 83/71, pulse 80, temperature 98.1 F (36.7 C), resp. rate (!) 0, height 5\' 7"  (1.702 m), weight 99 kg, last menstrual period 04/02/2017, SpO2 100 %. CVP:  [8 mmHg-23 mmHg] 8 mmHg  Vent Mode: PRVC FiO2 (%):  [40 %-50 %] 40 % Set Rate:  [12 bmp-14 bmp] 12 bmp Vt Set:  [360 mL] 360 mL PEEP:  [5 cmH20] 5 cmH20 Plateau Pressure:  [27 [id number] cmH20] 27 cmH20   Intake/Output Summary (Last 24 hours) at 11/09/2019 0930 Last data filed at 11/03/2019 0900 Gross per 24 hour  Intake 9089.67 ml  Output 6930 ml  Net 2159.67 ml   Filed Weights   10/28/2019 0700 10/29/2019 0615  Weight: 71 kg 99 kg    Examination: General: Intubated, nonresponsive HENT: Endotracheal tube to vent Lungs: Breath sounds diminished in the right lung field Cardiovascular:  wound VAC anterior chest, multiple anterior mediastinal chest tubes, central ecmo cannulae Abdomen: Soft Extremities: Diffusely edematous  neuro: Not responsive MSK: Diffuse ecchymoses Lines/Tubes: Central ECMO cannulation, left IJ CVC, right brachial arterial line  Resolved Hospital Problem list     Assessment & Plan:  [name] is a 49 y.o. woman with history of SLE with thrombocytopenia, nephritis, AVN and possible mild chronic RV failure who presented for repair of severe symptomatic tricuspid regurgitation on 4/7.    Acute hypoxemic respiratory failure - maintain  full vent support with LTVV. She is due for repeat OR trip today for right mediastinal exploration for suspected hemothorax. Suspect she will be intubated through the weekend. - on minimal sweep at 3, can drop to 2 prior to any extubation attempts to correct alkalosis.   Cardiogenic Shock - V/A ecmo since 4/8 - on minimal sweep at 3.  - on iNO, milrinone, neosynephrine, cleviprex. Lactic acid has normalized.  - per HF team.   SLE with immune mediated thrombocytopenia and coagulopathy - platelets currently 64 after transfusion - suspect hemolysis from ecmo circuit also contributing to bleeding.  - s/p solumedrol 125 on 4/8. Can probably hold on additional steroids since bleeding has slowed.  - fibrinogen 287 today.   Acute encephalopathy - not responsive, currently on precedex with prn versed, opioids.    Best practice:  Diet: N.p.o. Pain/Anxiety/Delirium protocol (if indicated): Precedex VAP protocol (if indicated): Ordered DVT prophylaxis: SCDs in the setting of coagulopathy and bleeding GI prophylaxis: Pantoprazole Glucose control: Controlled, blood sugars less than 180 Foley required for critically ill state Mobility: Bedrest Code Status: Full code Family Communication: Per primary team Disposition: Needs ICU  Labs   CBC: Recent Labs  Lab 10/19/2019 1749 10/20/2019 2228 10/20/2019 1107 11/08/2019 1107 11/03/2019 1632 10/25/2019 1633 10/16/2019 1843 10/31/2019 1843 10/28/2019 2202 10/27/2019 2202 11/03/2019 2209 11/07/2019  3810 11/09/2019 0524 10/30/2019 0729 10/24/2019 0735  WBC 3.0*   < > 1.0*  --  2.2*  --  2.7*  --  2.9*  --   --  3.6*  --   --   --   NEUTROABS 2.5  --   --   --   --   --   --   --   --   --   --   --   --   --   --   HGB 7.7*   < > 3.9*   < > 11.4*   < > 9.7*   < > 8.0*   < > 6.5* 8.0* 6.5* 7.5* 8.2*  HCT 23.3*   < > 11.7*   < > 32.4*   < > 27.6*   < > 22.7*   < > 19.0* 22.6* 19.0* 22.0* 24.0*  MCV 86.6   < > 88.0  --  84.2  --  83.4  --  83.2  --   --  83.1  --   --    --   PLT 77*   < > 170  --  100*  --  74*  --  61*  --   --  64*  --   --   --    < > = values in this interval not displayed.    Basic Metabolic Panel: Recent Labs  Lab 11/04/2019 0334 11/11/2019 0334 10/29/2019 0636 11/06/2019 0636 10/16/2019 1107 11/06/2019 1107 10/25/2019 1632 11/04/2019 1633 11/02/2019 1833 10/18/2019 1833 11/07/2019 2202 11/13/2019 2202 10/29/2019 2209 10/20/2019 0512 11/12/2019 0524 11/01/2019 0729 11/12/2019 0735  NA 149*   < > 148*   < > 153*   < > 152*   < > 150*   < > 150*   < > 151* 152* 152* 151* 150*  K 4.5   < > 3.9   < > 4.1   < > 3.7   < > 4.1   < > 4.2   < > 4.1 4.1 4.0 4.0 4.0  CL 118*   < > 121*   < > 112*   < > 120*  --  111  --  113*  --   --  115*  --   --  112*  CO2 17*   < > 17*  --  28  --  23  --   --   --  25  --   --  25  --   --   --   GLUCOSE 168*   < > 141*   < > 181*   < > 126*  --  119*  --  109*  --   --  74  --   --  82  BUN 13   < > 12   < > 14   < > 15  --  19  --  20  --   --  24*  --   --  24*  CREATININE 1.16*   < > 0.93   < > 1.42*   < > 1.36*  --  1.40*  --  1.78*  --   --  2.04*  --   --  2.10*  CALCIUM 5.6*   < > 4.3*  --  6.5*  --  6.3*  --   --   --  7.4*  --   --  7.4*  --   --   --   MG  1.3*  --   --   --   --   --  1.6*  --   --   --   --   --   --   --   --   --   --    < > = values in this interval not displayed.   GFR: Estimated Creatinine Clearance: 40.5 mL/min (A) (by C-G formula based on SCr of 2.1 mg/dL (H)). Recent Labs  Lab 10/28/2019 0956 11/09/2019 1107 11/02/2019 1632 10/20/2019 1838 10/19/2019 1843 11/09/2019 2202 10/16/2019 0512  WBC  --    < > 2.2*  --  2.7* 2.9* 3.6*  LATICACIDVEN 3.0*  --   --  2.3*  --   --  1.6   < > = values in this interval not displayed.    Liver Function Tests: Recent Labs  Lab 10/19/19 1230 10/19/2019 0636 10/15/2019 1107 11/11/2019 0512  AST 32 43* 28 242*  ALT 31 11 13  116*  ALKPHOS 70 12* 25* 25*  BILITOT 0.4 1.2 0.7 1.3*  PROT 5.5* <3.0* 3.1* 3.4*  ALBUMIN 2.6* 1.9* 1.9* 2.4*   No results for  input(s): LIPASE, AMYLASE in the last 168 hours. No results for input(s): AMMONIA in the last 168 hours.  ABG    Component Value Date/Time   PHART 7.510 (H) 10/18/2019 0729   PCO2ART 38.3 11/04/2019 0729   PO2ART 414.0 (H) 10/18/2019 0729   HCO3 30.7 (H) 11/01/2019 0729   TCO2 32 10/16/2019 0735   ACIDBASEDEF 1.0 10/24/2019 1708   O2SAT 100.0 10/15/2019 0729     Coagulation Profile: Recent Labs  Lab 10/28/2019 1749 11/08/2019 0636 10/19/2019 1107 11/13/2019 2202 10/30/2019 0512  INR 1.4* 2.0* 1.5* 1.5* 1.4*    Cardiac Enzymes: No results for input(s): CKTOTAL, CKMB, CKMBINDEX, TROPONINI in the last 168 hours.  HbA1C: Hgb A1c MFr Bld  Date/Time Value Ref Range Status  10/19/2019 12:30 PM 7.3 (H) 4.8 - 5.6 % Final    Comment:    (NOTE) Pre diabetes:          5.7%-6.4% Diabetes:              >6.4% Glycemic control for   <7.0% adults with diabetes     CBG: Recent Labs  Lab 10/15/2019 0421 10/30/2019 0525 11/04/2019 0611 10/19/2019 0724 10/30/2019 0826  GLUCAP 74 70 84 81 75    Review of Systems:   Unable to obtain due to mental status  Surgical History    Past Surgical History:  Procedure Laterality Date  . bilat hip grafts     2007  . BUNIONECTOMY  2018  . CANNULATION FOR ECMO (EXTRACORPOREAL MEMBRANE OXYGENATION) BEDSIDE N/A 11/09/2019   Procedure: Cannulation For Ecmo (Extracorporeal Membrane Oxygenation);  Surgeon: Rexene Alberts, MD;  Location: [place];  Service: Open Heart Surgery;  Laterality: N/A;  . EXPLORATION POST OPERATIVE OPEN HEART N/A 10/30/2019   Procedure: Re-exploration of post-operative open heart procedure for bleeding;  Surgeon: Rexene Alberts, MD;  Location: [place];  Service: Open Heart Surgery;  Laterality: N/A;  . HIP SURGERY     x2  . MINIMALLY INVASIVE TRICUSPID VALVE REPAIR Right 11/11/2019   Procedure: MINIMALLY INVASIVE TRICUSPID VALVE REPAIR USING MC3 TRICUSPID ANNULOPLASTY RING;  Surgeon: Rexene Alberts, MD;  Location: [place];  Service: Open  Heart Surgery;  Laterality: Right;  . RIGHT HEART CATH N/A 01/02/2019   Procedure: RIGHT HEART CATH;  Surgeon: Jolaine Artist, MD;  Location: [place]  CV [facility];  Service: Cardiovascular;  Laterality: N/A;  . RIGHT/LEFT HEART CATH AND CORONARY ANGIOGRAPHY N/A 07/11/2018   Procedure: RIGHT/LEFT HEART CATH AND CORONARY ANGIOGRAPHY;  Surgeon: Jolaine Artist, MD;  Location: [place] [facility];  Service: Cardiovascular;  Laterality: N/A;  . SHOULDER SURGERY Bilateral 2015   Core displacement  . TEE WITHOUT CARDIOVERSION N/A 08/07/2019   Procedure: TRANSESOPHAGEAL ECHOCARDIOGRAM (TEE);  Surgeon: Jolaine Artist, MD;  Location[facility] ENDOSCOPY;  Service: Cardiovascular;  Laterality: N/A;  . TEE WITHOUT CARDIOVERSION N/A 10/25/2019   Procedure: TRANSESOPHAGEAL ECHOCARDIOGRAM (TEE);  Surgeon: Rexene Alberts, MD;  Location: [place];  Service: Open Heart Surgery;  Laterality: N/A;  . TOTAL HIP ARTHROPLASTY  08/2016 and 10/2016   x2  . UMBILICAL HERNIA REPAIR       Social History   reports that she has never smoked. She has never used smokeless tobacco. She reports that she does not drink alcohol or use drugs.   Family History   Her family history includes Anemia in her father; Breast cancer in her maternal grandmother and mother; Cancer in her maternal grandfather, maternal grandmother, and mother; Clotting disorder in her father; Heart disease in her mother; Hypertension in her father; Sleep apnea in her father.   Allergies Allergies  Allergen Reactions  . Lisinopril     angioedema  . Penicillins Hives    DID THE REACTION INVOLVE: Swelling of the face/tongue/throat, SOB, or low BP? Unknown Sudden or severe rash/hives, skin peeling, or the inside of the mouth or nose? Unknown Did it require medical treatment? Unknown When did it last happen?age 82 If all above answers are "NO", may proceed with cephalosporin use.      Home Medications  Prior to Admission medications     Medication Sig Start Date End Date Taking? Authorizing Provider  furosemide (LASIX) 40 MG tablet Take 1 tablet (40 mg total) by mouth 3 (three) times a week. Every Monday, Wednesday and Friday Patient taking differently: Take 40 mg by mouth every Monday, Wednesday, and Friday.  04/15/19 04/14/20 Yes [name], [name], MD  glucose 4 GM chewable tablet Chew 4 tablets by mouth in the morning.   Yes [provider]  hydroxychloroquine (PLAQUENIL) 200 MG tablet Take 400 mg by mouth daily.    Yes [provider]  levothyroxine (SYNTHROID) 50 MCG tablet Take 50 mcg by mouth daily before breakfast.    Yes [provider]  losartan (COZAAR) 50 MG tablet Take 50 mg by mouth daily.   Yes [provider]  metFORMIN (GLUCOPHAGE) 500 MG tablet Take 500-1,000 mg by mouth See admin instructions. Take 1000 mg by mouth in the morning and 500 mg in the evening   Yes [provider]  spironolactone (ALDACTONE) 25 MG tablet TAKE (1/2) TABLET DAILY. Patient taking differently: Take 12.5 mg by mouth every Monday, Wednesday, and Friday.  09/11/19  Yes [name], [name], MD  potassium chloride SA (KLOR-CON) 20 MEQ tablet Take 1 tablet (20 mEq total) by mouth 3 (three) times a week. Every Monday, Wednesday, and Friday 04/15/19   [name], [name], MD     Critical care time   The patient is critically ill with multiple organ systems failure and requires high complexity decision making for assessment and support, frequent evaluation and titration of therapies, application of advanced monitoring technologies and extensive interpretation of multiple databases.   Critical Care Time devoted to patient care services described in this note is 65 minutes. This time reflects  my personal involvement in patient care. This critical care time does not reflect separately billable procedures or procedure time, teaching time or supervisory time of PA/NP/Med student/Med Resident etc but could  involve care discussion time.  [facility] 11/11/2019 9:30 AM  Pager: [phone] After hours pager: [phone]

## 2019-10-23 NOTE — Progress Notes (Signed)
  Echocardiogram Echocardiogram Transesophageal has been performed.  [name] 11/11/2019, 4:59 PM

## 2019-10-24 ENCOUNTER — Inpatient Hospital Stay (HOSPITAL_COMMUNITY): Payer: BC Managed Care – PPO

## 2019-10-24 ENCOUNTER — Encounter: Payer: Self-pay | Admitting: *Deleted

## 2019-10-24 DIAGNOSIS — G40901 Epilepsy, unspecified, not intractable, with status epilepticus: Secondary | ICD-10-CM | POA: Diagnosis not present

## 2019-10-24 LAB — BPAM RBC
Blood Product Expiration Date: 202104192359
Blood Product Expiration Date: 202104212359
Blood Product Expiration Date: 202104212359
Blood Product Expiration Date: 202104242359
Blood Product Expiration Date: 202104252359
Blood Product Expiration Date: 202104272359
Blood Product Expiration Date: 202104272359
Blood Product Expiration Date: 202104282359
Blood Product Expiration Date: 202104302359
Blood Product Expiration Date: 202105022359
Blood Product Expiration Date: 202105042359
Blood Product Expiration Date: 202105042359
Blood Product Expiration Date: 202105052359
Blood Product Expiration Date: 202105062359
Blood Product Expiration Date: 202105062359
Blood Product Expiration Date: 202105062359
Blood Product Expiration Date: 202105062359
Blood Product Expiration Date: 202105072359
Blood Product Expiration Date: 202105072359
Blood Product Expiration Date: 202105072359
Blood Product Expiration Date: 202105082359
Blood Product Expiration Date: 202105082359
Blood Product Expiration Date: 202105082359
Blood Product Expiration Date: 202105082359
Blood Product Expiration Date: 202105082359
Blood Product Expiration Date: 202105092359
Blood Product Expiration Date: 202105102359
Blood Product Expiration Date: 202105102359
Blood Product Expiration Date: 202105112359
Blood Product Expiration Date: 202105122359
Blood Product Expiration Date: 202105122359
Blood Product Expiration Date: 202105132359
Blood Product Expiration Date: 202105132359
Blood Product Expiration Date: 202105132359
Blood Product Expiration Date: 202105142359
Blood Product Expiration Date: 202105142359
Blood Product Expiration Date: 202105142359
Blood Product Expiration Date: 202105152359
Blood Product Expiration Date: 202105152359
Blood Product Expiration Date: 202105162359
Blood Product Expiration Date: 202105162359
ISSUE DATE / TIME: 202104071729
ISSUE DATE / TIME: 202104071729
ISSUE DATE / TIME: 202104071913
ISSUE DATE / TIME: 202104071913
ISSUE DATE / TIME: 202104071923
ISSUE DATE / TIME: 202104071923
ISSUE DATE / TIME: 202104072044
ISSUE DATE / TIME: 202104072044
ISSUE DATE / TIME: 202104072044
ISSUE DATE / TIME: 202104072044
ISSUE DATE / TIME: 202104072100
ISSUE DATE / TIME: 202104072100
ISSUE DATE / TIME: 202104072100
ISSUE DATE / TIME: 202104072100
ISSUE DATE / TIME: 202104072151
ISSUE DATE / TIME: 202104072151
ISSUE DATE / TIME: 202104080120
ISSUE DATE / TIME: 202104080120
ISSUE DATE / TIME: 202104080120
ISSUE DATE / TIME: 202104080120
ISSUE DATE / TIME: 202104080120
ISSUE DATE / TIME: 202104080120
ISSUE DATE / TIME: 202104080422
ISSUE DATE / TIME: 202104080422
ISSUE DATE / TIME: 202104080422
ISSUE DATE / TIME: 202104080422
ISSUE DATE / TIME: 202104080801
ISSUE DATE / TIME: 202104080801
ISSUE DATE / TIME: 202104080801
ISSUE DATE / TIME: 202104080801
ISSUE DATE / TIME: 202104081214
ISSUE DATE / TIME: 202104081214
ISSUE DATE / TIME: 202104081214
ISSUE DATE / TIME: 202104081214
ISSUE DATE / TIME: 202104081335
ISSUE DATE / TIME: 202104081335
ISSUE DATE / TIME: 202104081335
ISSUE DATE / TIME: 202104081335
ISSUE DATE / TIME: 202104081418
ISSUE DATE / TIME: 202104091340
ISSUE DATE / TIME: 202104091340
Unit Type and Rh: 5100
Unit Type and Rh: 5100
Unit Type and Rh: 5100
Unit Type and Rh: 5100
Unit Type and Rh: 5100
Unit Type and Rh: 5100
Unit Type and Rh: 5100
Unit Type and Rh: 5100
Unit Type and Rh: 5100
Unit Type and Rh: 5100
Unit Type and Rh: 5100
Unit Type and Rh: 5100
Unit Type and Rh: 5100
Unit Type and Rh: 5100
Unit Type and Rh: 5100
Unit Type and Rh: 5100
Unit Type and Rh: 5100
Unit Type and Rh: 5100
Unit Type and Rh: 5100
Unit Type and Rh: 5100
Unit Type and Rh: 600
Unit Type and Rh: 600
Unit Type and Rh: 600
Unit Type and Rh: 600
Unit Type and Rh: 6200
Unit Type and Rh: 6200
Unit Type and Rh: 6200
Unit Type and Rh: 6200
Unit Type and Rh: 6200
Unit Type and Rh: 6200
Unit Type and Rh: 6200
Unit Type and Rh: 6200
Unit Type and Rh: 9500
Unit Type and Rh: 9500
Unit Type and Rh: 9500
Unit Type and Rh: 9500
Unit Type and Rh: 9500
Unit Type and Rh: 9500
Unit Type and Rh: 9500
Unit Type and Rh: 9500
Unit Type and Rh: 9500

## 2019-10-24 LAB — TYPE AND SCREEN
ABO/RH(D): A POS
Antibody Screen: POSITIVE
DAT, IgG: NEGATIVE
Donor AG Type: NEGATIVE
Donor AG Type: NEGATIVE
Donor AG Type: NEGATIVE
Unit division: 0
Unit division: 0
Unit division: 0
Unit division: 0
Unit division: 0
Unit division: 0
Unit division: 0
Unit division: 0
Unit division: 0
Unit division: 0
Unit division: 0
Unit division: 0
Unit division: 0
Unit division: 0
Unit division: 0
Unit division: 0
Unit division: 0
Unit division: 0
Unit division: 0
Unit division: 0
Unit division: 0
Unit division: 0
Unit division: 0
Unit division: 0
Unit division: 0
Unit division: 0
Unit division: 0
Unit division: 0
Unit division: 0
Unit division: 0
Unit division: 0
Unit division: 0
Unit division: 0
Unit division: 0
Unit division: 0
Unit division: 0
Unit division: 0
Unit division: 0
Unit division: 0
Unit division: 0
Unit division: 0

## 2019-10-24 LAB — HEPATIC FUNCTION PANEL
ALT: 109 U/L — ABNORMAL HIGH (ref 0–44)
AST: 169 U/L — ABNORMAL HIGH (ref 15–41)
Albumin: 2.2 g/dL — ABNORMAL LOW (ref 3.5–5.0)
Alkaline Phosphatase: 37 U/L — ABNORMAL LOW (ref 38–126)
Bilirubin, Direct: 0.3 mg/dL — ABNORMAL HIGH (ref 0.0–0.2)
Indirect Bilirubin: 1.6 mg/dL — ABNORMAL HIGH (ref 0.3–0.9)
Total Bilirubin: 1.9 mg/dL — ABNORMAL HIGH (ref 0.3–1.2)
Total Protein: 3.5 g/dL — ABNORMAL LOW (ref 6.5–8.1)

## 2019-10-24 LAB — POCT I-STAT 7, (LYTES, BLD GAS, ICA,H+H)
Acid-Base Excess: 6 mmol/L — ABNORMAL HIGH (ref 0.0–2.0)
Acid-Base Excess: 5 mmol/L — ABNORMAL HIGH (ref 0.0–2.0)
Acid-Base Excess: 7 mmol/L — ABNORMAL HIGH (ref 0.0–2.0)
Acid-Base Excess: 5 mmol/L — ABNORMAL HIGH (ref 0.0–2.0)
Acid-Base Excess: 7 mmol/L — ABNORMAL HIGH (ref 0.0–2.0)
Bicarbonate: 29.9 mmol/L — ABNORMAL HIGH (ref 20.0–28.0)
Bicarbonate: 29.6 mmol/L — ABNORMAL HIGH (ref 20.0–28.0)
Bicarbonate: 30.6 mmol/L — ABNORMAL HIGH (ref 20.0–28.0)
Bicarbonate: 30.2 mmol/L — ABNORMAL HIGH (ref 20.0–28.0)
Bicarbonate: 31.1 mmol/L — ABNORMAL HIGH (ref 20.0–28.0)
Calcium, Ion: 1.18 mmol/L (ref 1.15–1.40)
Calcium, Ion: 1.16 mmol/L (ref 1.15–1.40)
Calcium, Ion: 1.24 mmol/L (ref 1.15–1.40)
Calcium, Ion: 1.22 mmol/L (ref 1.15–1.40)
Calcium, Ion: 1.23 mmol/L (ref 1.15–1.40)
HCT: 24 % — ABNORMAL LOW (ref 36.0–46.0)
HCT: 24 % — ABNORMAL LOW (ref 36.0–46.0)
HCT: 23 % — ABNORMAL LOW (ref 36.0–46.0)
HCT: 24 % — ABNORMAL LOW (ref 36.0–46.0)
HCT: 23 % — ABNORMAL LOW (ref 36.0–46.0)
Hemoglobin: 8.2 g/dL — ABNORMAL LOW (ref 12.0–15.0)
Hemoglobin: 8.2 g/dL — ABNORMAL LOW (ref 12.0–15.0)
Hemoglobin: 7.8 g/dL — ABNORMAL LOW (ref 12.0–15.0)
Hemoglobin: 8.2 g/dL — ABNORMAL LOW (ref 12.0–15.0)
Hemoglobin: 7.8 g/dL — ABNORMAL LOW (ref 12.0–15.0)
O2 Saturation: 100 %
O2 Saturation: 100 %
O2 Saturation: 100 %
O2 Saturation: 100 %
O2 Saturation: 100 %
Patient temperature: 36.6
Patient temperature: 36.3
Patient temperature: 36.3
Potassium: 3.9 mmol/L (ref 3.5–5.1)
Potassium: 3.9 mmol/L (ref 3.5–5.1)
Potassium: 3.7 mmol/L (ref 3.5–5.1)
Potassium: 3.6 mmol/L (ref 3.5–5.1)
Potassium: 3.5 mmol/L (ref 3.5–5.1)
Sodium: 149 mmol/L — ABNORMAL HIGH (ref 135–145)
Sodium: 148 mmol/L — ABNORMAL HIGH (ref 135–145)
Sodium: 147 mmol/L — ABNORMAL HIGH (ref 135–145)
Sodium: 148 mmol/L — ABNORMAL HIGH (ref 135–145)
Sodium: 147 mmol/L — ABNORMAL HIGH (ref 135–145)
TCO2: 31 mmol/L (ref 22–32)
TCO2: 31 mmol/L (ref 22–32)
TCO2: 32 mmol/L (ref 22–32)
TCO2: 32 mmol/L (ref 22–32)
TCO2: 32 mmol/L (ref 22–32)
pCO2 arterial: 40.5 mmHg (ref 32.0–48.0)
pCO2 arterial: 40.7 mmHg (ref 32.0–48.0)
pCO2 arterial: 39.6 mmHg (ref 32.0–48.0)
pCO2 arterial: 45 mmHg (ref 32.0–48.0)
pCO2 arterial: 42.8 mmHg (ref 32.0–48.0)
pH, Arterial: 7.475 — ABNORMAL HIGH (ref 7.350–7.450)
pH, Arterial: 7.467 — ABNORMAL HIGH (ref 7.350–7.450)
pH, Arterial: 7.495 — ABNORMAL HIGH (ref 7.350–7.450)
pH, Arterial: 7.435 (ref 7.350–7.450)
pH, Arterial: 7.467 — ABNORMAL HIGH (ref 7.350–7.450)
pO2, Arterial: 363 mmHg — ABNORMAL HIGH (ref 83.0–108.0)
pO2, Arterial: 302 mmHg — ABNORMAL HIGH (ref 83.0–108.0)
pO2, Arterial: 372 mmHg — ABNORMAL HIGH (ref 83.0–108.0)
pO2, Arterial: 362 mmHg — ABNORMAL HIGH (ref 83.0–108.0)
pO2, Arterial: 357 mmHg — ABNORMAL HIGH (ref 83.0–108.0)

## 2019-10-24 LAB — LACTATE DEHYDROGENASE: LDH: 581 U/L — ABNORMAL HIGH (ref 98–192)

## 2019-10-24 LAB — BASIC METABOLIC PANEL
Anion gap: 10 (ref 5–15)
Anion gap: 9 (ref 5–15)
BUN: 31 mg/dL — ABNORMAL HIGH (ref 6–20)
BUN: 34 mg/dL — ABNORMAL HIGH (ref 6–20)
CO2: 25 mmol/L (ref 22–32)
CO2: 25 mmol/L (ref 22–32)
Calcium: 7.7 mg/dL — ABNORMAL LOW (ref 8.9–10.3)
Calcium: 8.2 mg/dL — ABNORMAL LOW (ref 8.9–10.3)
Chloride: 115 mmol/L — ABNORMAL HIGH (ref 98–111)
Chloride: 113 mmol/L — ABNORMAL HIGH (ref 98–111)
Creatinine, Ser: 2.49 mg/dL — ABNORMAL HIGH (ref 0.44–1.00)
Creatinine, Ser: 2.55 mg/dL — ABNORMAL HIGH (ref 0.44–1.00)
GFR calc Af Amer: 26 mL/min — ABNORMAL LOW (ref >60)
GFR calc Af Amer: 25 mL/min — ABNORMAL LOW (ref >60)
GFR calc non Af Amer: 22 mL/min — ABNORMAL LOW (ref >60)
GFR calc non Af Amer: 22 mL/min — ABNORMAL LOW (ref >60)
Glucose, Bld: 86 mg/dL (ref 70–99)
Glucose, Bld: 66 mg/dL — ABNORMAL LOW (ref 70–99)
Potassium: 4 mmol/L (ref 3.5–5.1)
Potassium: 3.8 mmol/L (ref 3.5–5.1)
Sodium: 150 mmol/L — ABNORMAL HIGH (ref 135–145)
Sodium: 147 mmol/L — ABNORMAL HIGH (ref 135–145)

## 2019-10-24 LAB — CBC
HCT: 29.3 % — ABNORMAL LOW (ref 36.0–46.0)
HCT: 26.9 % — ABNORMAL LOW (ref 36.0–46.0)
Hemoglobin: 10.1 g/dL — ABNORMAL LOW (ref 12.0–15.0)
Hemoglobin: 9.2 g/dL — ABNORMAL LOW (ref 12.0–15.0)
MCH: 29.9 pg (ref 26.0–34.0)
MCH: 29.8 pg (ref 26.0–34.0)
MCHC: 34.5 g/dL (ref 30.0–36.0)
MCHC: 34.2 g/dL (ref 30.0–36.0)
MCV: 86.7 fL (ref 80.0–100.0)
MCV: 87.1 fL (ref 80.0–100.0)
Platelets: 68 10*3/uL — ABNORMAL LOW (ref 150–400)
Platelets: 47 10*3/uL — ABNORMAL LOW (ref 150–400)
RBC: 3.38 MIL/uL — ABNORMAL LOW (ref 3.87–5.11)
RBC: 3.09 MIL/uL — ABNORMAL LOW (ref 3.87–5.11)
RDW: 15.9 % — ABNORMAL HIGH (ref 11.5–15.5)
RDW: 16.4 % — ABNORMAL HIGH (ref 11.5–15.5)
WBC: 5.7 10*3/uL (ref 4.0–10.5)
WBC: 5.2 10*3/uL (ref 4.0–10.5)
nRBC: 2.3 % — ABNORMAL HIGH (ref 0.0–0.2)
nRBC: 1.7 % — ABNORMAL HIGH (ref 0.0–0.2)

## 2019-10-24 LAB — GLUCOSE, CAPILLARY
Glucose-Capillary: 63 mg/dL — ABNORMAL LOW (ref 70–99)
Glucose-Capillary: 65 mg/dL — ABNORMAL LOW (ref 70–99)
Glucose-Capillary: 73 mg/dL (ref 70–99)
Glucose-Capillary: 77 mg/dL (ref 70–99)
Glucose-Capillary: 77 mg/dL (ref 70–99)
Glucose-Capillary: 78 mg/dL (ref 70–99)
Glucose-Capillary: 83 mg/dL (ref 70–99)
Glucose-Capillary: 92 mg/dL (ref 70–99)

## 2019-10-24 LAB — MAGNESIUM: Magnesium: 2.1 mg/dL (ref 1.7–2.4)

## 2019-10-24 LAB — PROTIME-INR
INR: 1.3 — ABNORMAL HIGH (ref 0.8–1.2)
Prothrombin Time: 16 seconds — ABNORMAL HIGH (ref 11.4–15.2)

## 2019-10-24 LAB — PREPARE PLATELET PHERESIS
Unit division: 0
Unit division: 0

## 2019-10-24 LAB — CALCIUM, IONIZED: Calcium, Ionized, Serum: 4.5 mg/dL (ref 4.5–5.6)

## 2019-10-24 LAB — BPAM PLATELET PHERESIS
Blood Product Expiration Date: 202104112359
Blood Product Expiration Date: 202104112359
ISSUE DATE / TIME: 202104091346
ISSUE DATE / TIME: 202104091346
Unit Type and Rh: 5100
Unit Type and Rh: 6200

## 2019-10-24 LAB — APTT
aPTT: 39 seconds — ABNORMAL HIGH (ref 24–36)
aPTT: 42 seconds — ABNORMAL HIGH (ref 24–36)
aPTT: 65 seconds — ABNORMAL HIGH (ref 24–36)
aPTT: 74 seconds — ABNORMAL HIGH (ref 24–36)

## 2019-10-24 LAB — LACTIC ACID, PLASMA: Lactic Acid, Venous: 1.5 mmol/L (ref 0.5–1.9)

## 2019-10-24 LAB — FIBRINOGEN: Fibrinogen: 429 mg/dL (ref 210–475)

## 2019-10-24 LAB — TRIGLYCERIDES: Triglycerides: 31 mg/dL (ref <150)

## 2019-10-24 LAB — PHOSPHORUS: Phosphorus: 6.6 mg/dL — ABNORMAL HIGH (ref 2.5–4.6)

## 2019-10-24 LAB — HEPARIN LEVEL (UNFRACTIONATED): Heparin Unfractionated: <0.1 IU/mL — ABNORMAL LOW (ref 0.30–0.70)

## 2019-10-24 MED ORDER — HEPARIN (PORCINE) 25000 UT/250ML-% IV SOLN
500.0000 [IU]/h | INTRAVENOUS | Status: DC
  Administered 2019-10-24 – 2019-10-26 (×2): 500 [IU]/h via INTRAVENOUS
  Filled 2019-10-24 (×2): qty 250

## 2019-10-24 MED ORDER — VITAL HIGH PROTEIN PO LIQD: 1000.0000 mL | ORAL | Status: DC

## 2019-10-24 MED ORDER — PHENYLEPHRINE 40 MCG/ML (10ML) SYRINGE FOR IV PUSH (FOR BLOOD PRESSURE SUPPORT)
PREFILLED_SYRINGE | INTRAVENOUS | Status: AC
  Filled 2019-10-24: qty 10

## 2019-10-24 MED ORDER — SODIUM CHLORIDE 0.9 % IV SOLN
2000.0000 mg | INTRAVENOUS | Status: AC
  Administered 2019-10-24: 2000 mg via INTRAVENOUS
  Filled 2019-10-24: qty 20

## 2019-10-24 MED ORDER — SODIUM CHLORIDE 0.9 % IV SOLN
750.0000 mg | Freq: Two times a day (BID) | INTRAVENOUS | Status: DC
  Administered 2019-10-25: 750 mg via INTRAVENOUS
  Filled 2019-10-24 (×2): qty 7.5

## 2019-10-24 MED ORDER — POTASSIUM CHLORIDE 10 MEQ/50ML IV SOLN: 10.0000 meq | INTRAVENOUS | Status: DC

## 2019-10-24 MED ORDER — PROPOFOL 1000 MG/100ML IV EMUL
40.0000 ug/kg/min | INTRAVENOUS | Status: DC
  Administered 2019-10-24: 30 ug/kg/min via INTRAVENOUS
  Administered 2019-10-24: 10 ug/kg/min via INTRAVENOUS
  Administered 2019-10-25: 40 ug/kg/min via INTRAVENOUS
  Administered 2019-10-25: 30 ug/kg/min via INTRAVENOUS
  Administered 2019-10-25 (×3): 40 ug/kg/min via INTRAVENOUS
  Administered 2019-10-25: 30 ug/kg/min via INTRAVENOUS
  Administered 2019-10-26 – 2019-10-27 (×8): 40 ug/kg/min via INTRAVENOUS
  Administered 2019-10-28: 15 ug/kg/min via INTRAVENOUS
  Administered 2019-10-28 (×2): 40 ug/kg/min via INTRAVENOUS
  Administered 2019-10-28: 34.614 ug/kg/min via INTRAVENOUS
  Administered 2019-10-28: 40 ug/kg/min via INTRAVENOUS
  Filled 2019-10-24 (×14): qty 100
  Filled 2019-10-24: qty 200
  Filled 2019-10-24 (×7): qty 100

## 2019-10-24 MED ORDER — SODIUM BICARBONATE 8.4 % IV SOLN
INTRAVENOUS | Status: AC
  Filled 2019-10-24: qty 50

## 2019-10-24 MED ORDER — CHLORHEXIDINE GLUCONATE CLOTH 2 % EX PADS
6.0000 | MEDICATED_PAD | Freq: Every day | CUTANEOUS | Status: DC
  Administered 2019-10-25: 6 via TOPICAL

## 2019-10-24 MED ORDER — CALCIUM CHLORIDE 10 % IV SOLN
INTRAVENOUS | Status: AC
  Filled 2019-10-24: qty 10

## 2019-10-24 MED ORDER — LORAZEPAM 2 MG/ML IJ SOLN: 2.0000 mg | Freq: Once | INTRAMUSCULAR | Status: AC

## 2019-10-24 MED ORDER — SODIUM CHLORIDE 0.9 % IV SOLN
100.0000 mg | Freq: Two times a day (BID) | INTRAVENOUS | Status: DC
  Administered 2019-10-25 – 2019-10-26 (×3): 100 mg via INTRAVENOUS
  Filled 2019-10-24 (×4): qty 10

## 2019-10-24 MED ORDER — SODIUM CHLORIDE 0.9 % IV SOLN: 100.0000 mg | Freq: Two times a day (BID) | INTRAVENOUS | Status: DC

## 2019-10-24 MED ORDER — ALBUMIN HUMAN 5 % IV SOLN
INTRAVENOUS | Status: AC
  Filled 2019-10-24: qty 250

## 2019-10-24 MED ORDER — LORAZEPAM 2 MG/ML IJ SOLN: 2.0000 mg | INTRAMUSCULAR | Status: DC

## 2019-10-24 MED ORDER — SODIUM CHLORIDE 0.9 % IV SOLN
200.0000 mg | Freq: Once | INTRAVENOUS | Status: AC
  Administered 2019-10-24: 15:00:00 200 mg via INTRAVENOUS
  Filled 2019-10-24: qty 20

## 2019-10-24 MED ORDER — EPINEPHRINE 1 MG/10ML IJ SOSY
PREFILLED_SYRINGE | INTRAMUSCULAR | Status: AC
  Filled 2019-10-24: qty 20

## 2019-10-24 MED ORDER — FREE WATER
200.0000 mL | Status: DC
  Administered 2019-10-24 – 2019-10-26 (×11): 200 mL

## 2019-10-24 MED ORDER — PIVOT 1.5 CAL PO LIQD
1000.0000 mL | ORAL | Status: DC
  Administered 2019-10-24: 1000 mL
  Filled 2019-10-24: qty 1000

## 2019-10-24 MED ORDER — POTASSIUM CHLORIDE 10 MEQ/50ML IV SOLN
10.0000 meq | INTRAVENOUS | Status: AC
  Administered 2019-10-24 – 2019-10-25 (×4): 10 meq via INTRAVENOUS
  Filled 2019-10-24 (×4): qty 50

## 2019-10-24 MED ORDER — LORAZEPAM 2 MG/ML IJ SOLN
INTRAMUSCULAR | Status: AC
  Administered 2019-10-24: 2 mg via INTRAVENOUS
  Filled 2019-10-24: qty 1

## 2019-10-24 NOTE — Progress Notes (Signed)
NAME:  [name], MRN:  [id number], DOB:  10/17/1970, LOS: 3 ADMISSION DATE:  10/18/2019, CONSULTATION DATE:  10/24/2019  REFERRING MD:  Rexene Alberts, MD, CHIEF COMPLAINT:  Vent management.  History of present illness   The patient is a 49 year old woman who is postoperative day 2 from minimally invasive tricuspid valve repair which was then converted into an open procedure.  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  However despite echocardiograms showing severely elevated RA pressures, she has had 2 right heart cath that demonstrated normal peripheral vascular resistance for Group 1 pulmonary arterial hypertension.  She was taken to the OR by Dr. Roxy Manns on 4/7 given her persistent symptoms for repair of severe tricuspid regurgitation.  4/8, she had reexploration of her median sternotomy with concern for postoperative bleeding.  There was a large amount of clot evacuated from the anterior mediastinum and significant oozing diffusely.  Does not appear that there were any significant signs for mechanical active bleeding.  Bili at that time she was cannulated for ECMO and started on [place] ECMO on 4/8.  Since then she has had substantial correction of her coagulopathy including Kcentra, FEIBA, cryoprecipitate, FFP and copious blood products given her substantial bleeding in the mediastinum.  She has also been started on inhaled nitric oxide for her component of right ventricular heart failure as well as    PCCM is being consulted to assist with vent management.  On discussion with nursing heart failure team this morning, the patient's bleeding seems to have stabilized.  Chest x-ray this morning reveals white of the right lung which is concerning for blood which may  have tamponaded and so the plan is for repeat OR exploration this afternoon.  She is on 10 of Neo-Synephrine and 5 of milrinone.  She is on 20 of inhaled nitric oxide.  She is on 0.5 Precedex for sedation and is not purposefully moving.  Some minimal urine output. Her ECMO settings this morning are notable for a flow of 3.2 LPM, 3000 RPM, sweep is 3.    Her femoral arterial ABG 7.5 PCO2 38 PO2 414.  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   Consults:  Heart failure, PCCM  Procedures:  4/7 tricuspid valve repair through median sternotomy 4/8 reexploration of the mediastinum with cannulation for ECMO   Significant Diagnostic Tests:  Transesophageal echocardiogram January 2021  1. Left ventricular ejection fraction, by visual estimation, is 60 to  65%. The left ventricle has normal function. There is no left ventricular  hypertrophy.  2. The left ventricle has no regional wall motion abnormalities.  3. Global right ventricle has mildly reduced systolic function.The right  ventricular size is normal. No increase in right ventricular wall  thickness.  4. Left atrial size was moderately dilated.  5. Right atrial size was severely dilated.  6. The mitral valve is normal in structure. Trivial mitral valve  regurgitation.  7. The tricuspid valve is abnormal.  8. The tricuspid valve is abnormal. Tricuspid valve regurgitation is  severe.  9. Apparent mild restriction of  septal leaflet. RVSP ~ 94mmHG.  10. The aortic valve is normal in structure. Aortic valve regurgitation is  not visualized.  11. The pulmonic valve was  grossly normal. Pulmonic valve regurgitation is  not visualized.  12. Mild plaque invoving the descending aorta.  13. Severely elevated pulmonary artery systolic pressure.   Right heart catheterization June 2020 Findings:  RA = 5 RV = 34/7 PA =  34/6 (20) PCW = 8 Fick cardiac output/index = 5.9/3.1 PVR = 2.0 WU Ao sat = 100% PA sat = 71%, 72% SVC sat = 66%  Assessment: 1. Minimally elevated PA pressures with normal PVR and cardiac output 2. No evidence of significant intracardiac shunt  Micro Data:    Antimicrobials:   Subjective:  Seems to have settled out overnight.  On lasix drip, minimal vent settings. Remains poorly responsive but did open eyes to voice earlier per nursing.  Objective   Blood pressure (!) 83/71, pulse 80, temperature (!) 97.5 F (36.4 C), resp. rate (!) 0, height 5\' 7"  (1.702 m), weight 96.3 kg, last menstrual period 04/02/2017, SpO2 100 %. CVP:  [7 mmHg-12 mmHg] 9 mmHg  Vent Mode: PRVC FiO2 (%):  [40 %] 40 % Set Rate:  [12 bmp] 12 bmp Vt Set:  [360 mL] 360 mL PEEP:  [5 cmH20] 5 cmH20 Plateau Pressure:  [26 cmH20-27 cmH20] 26 cmH20   Intake/Output Summary (Last 24 hours) at 10/24/2019 0724 Last data filed at 10/24/2019 0700 Gross per 24 hour  Intake 5615.64 ml  Output 3615 ml  Net 2000.64 ml   Filed Weights   10/27/2019 0615 10/16/2019 1800 10/24/19 0500  Weight: 99 kg 98.1 kg 96.3 kg    Examination: GEN: 49 year old woman on vent HEENT: ETT in place, small bloody secretions secretions CV: Open sternotomy with cannulas in place, ext warm PULM: Diminished bases, passive on vent GI: Soft, hypoactive BS EXT: global anasarca NEURO: R pupil reacting and bigger than left, left not reacting for me PSYCH: cannot assess SKIN/Circuit: fibrin strands at 3/12/6 o clock in oxygenator, minimal color change in tubing due to high SvO2  ABG 7.47/40/363 on sweep 3LPM, plat 24 Net 2L positive, 2L uop Sodium 150, BUN/Cr worsening Acute liver injury  improving CBGs on lower end CXR stable R airspace opacity, left lung clear, cannulas in place Fibrinogen 429 Lactate 1.5 LDH 449>>581 Resolved Hospital Problem list     Assessment & Plan:  [name] is a 49 y.o. woman with history of SLE with thrombocytopenia, nephritis, AVN and possible mild chronic RV failure who presented for repair of severe symptomatic tricuspid regurgitation on 4/7.    Acute hypoxemic respiratory failure - continue vent support - wean sweep to maintain pH 7.35-7.4 - usual VAP prevention measures  Cardiogenic Shock - V/A ecmo since 4/8 - diuresis, AC, and inotropes per CHF/TCTS teams: lasix gtt and heparin challenge today  SLE with immune mediated thrombocytopenia and coagulopathy - improved, monitor with AC resumption  Acute encephalopathy - not responsive, currently on precedex with prn versed, opioids.  - will need head CT once more stable   Best practice:  Diet: start trickle feeds, FWF Pain/Anxiety/Delirium protocol (if indicated): Precedex VAP protocol (if indicated): Ordered DVT prophylaxis: heparin gtt GI prophylaxis: Pantoprazole Glucose control: Controlled, blood sugars less than 180 Foley required for critically ill state Mobility: Bedrest Code Status: Full code Family Communication: Per primary team Disposition: Needs ICU    The patient is critically ill with multiple organ systems failure and  requires high complexity decision making for assessment and support, frequent evaluation and titration of therapies, application of advanced monitoring technologies and extensive interpretation of multiple databases. Critical Care Time devoted to patient care services described in this note independent of APP/resident time (if applicable)  is 31 minutes.   Erskine Emery MD [place] [facility] 10/24/2019 7:37 AM Personal pager: [phone] If unanswered, please page CCM On-call: [phone]

## 2019-10-24 NOTE — Procedures (Addendum)
Patient Name: [name]  MRN: [id number]  Epilepsy Attending: Lora Havens  Referring Physician/Provider: Dr Karena Addison Aroor Date: 10/24/2019 Duration: 25.31 mins  Patient history: 49 y.o. 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 Neurology consulted for right facial twitching noted. EEG to evaluate for seizure  Level of alertness: comatose  AEDs during EEG study: None  Technical aspects: This EEG study was done with scalp electrodes positioned according to the 10-20 International system of electrode placement. Electrical activity was acquired at a sampling rate of 500Hz  and reviewed with a high frequency filter of 70Hz  and a low frequency filter of 1Hz . EEG data were recorded continuously and digitally stored.   DESCRIPTION:  At the beginning of study patient was noted to have right face twitching. Concomitant eeg showed periodic epileptiform discharges at 2.5-3Hz  in left frontocentral region. Continuous generalized 2-3hz  delta slowing was also noted. Hyperventilation and photic stimulation were not performed.   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 Additionally, there is evidence of severe diffuse encephalopathy, non specific to etiology.   Dr Aroor was immediately notifed.   Lejuan Botto Barbra Sarks

## 2019-10-24 NOTE — Progress Notes (Signed)
[place] for heparin Indication: ECMO  Allergies  Allergen Reactions  . Lisinopril     angioedema  . Penicillins Hives    DID THE REACTION INVOLVE: Swelling of the face/tongue/throat, SOB, or low BP? Unknown Sudden or severe rash/hives, skin peeling, or the inside of the mouth or nose? Unknown Did it require medical treatment? Unknown When did it last happen?age 49 If all above answers are "NO", may proceed with cephalosporin use.     Patient Measurements: Height: '5\' 7"'$  (170.2 cm) Weight: 96.3 kg (212 lb 4.9 oz)(one pillow) IBW/kg (Calculated) : 61.6 Heparin Dosing Weight: 71 kg  Vital Signs: Temp: 97.9 F (36.6 C) (04/10 1830) Temp Source: Bladder (04/10 1600) BP: 96/74 (04/10 1518) Pulse Rate: 91 (04/10 1518)  Labs: Recent Labs    11/11/2019 1632 11/09/2019 1633 10/30/2019 2202 10/17/2019 2209 10/19/2019 0512 10/25/2019 0524 11/05/2019 1752 11/11/2019 1803 10/24/19 0356 10/24/19 0407 10/24/19 1055 10/24/19 1125 10/24/19 1708 10/24/19 1708 10/24/19 1716 10/24/19 1829  HGB 11.4*   < > 8.0*   < > 8.0*   < > 11.0*   < > 10.1*   < >  --    < > 9.2*   < > 7.8* 8.2*  HCT 32.4*   < > 22.7*   < > 22.6*   < > 31.3*   < > 29.3*   < >  --    < > 26.9*  --  23.0* 24.0*  PLT 100*   < > 61*   < > 64*   < > 86*  --  68*  --   --   --  47*  --   --   --   APTT 40*   < > 43*   < > 39*   < > 39*   < > 39*  --  42*  --  65*  --   --   --   LABPROT  --   --  17.7*   < > 17.4*  --  17.1*  --  16.0*  --   --   --   --   --   --   --   INR  --   --  1.5*   < > 1.4*  --  1.4*  --  1.3*  --   --   --   --   --   --   --   HEPARINUNFRC <0.10*  --  <0.10*  --   --   --   --   --   --   --   --   --  <0.10*  --   --   --   CREATININE 1.36*   < > 1.78*   < > 2.04*   < > 2.25*  --  2.49*  --   --   --  2.55*  --   --   --    < > = values in this interval not displayed.    Estimated Creatinine Clearance: 32.9 mL/min (A) (by C-G formula based on SCr of 2.55  mg/dL (H)).   Medical History: Past Medical History:  Diagnosis Date  . Avascular necrosis ([place])   . Diabetes mellitus without complication ([place]) 79/4801  . Hypertension   . Hypothyroidism 01/2019  . Lupus ([place])   . Lupus nephritis ([place])   . Prediabetes 10/2011  . Pulmonary hypertension ([place])   . 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 Tricuspid regurgitation   . 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 sodium chloride   Intravenous Once  . sodium chloride   Intravenous Once  . sodium chloride   Intravenous Once  . sodium chloride   Intravenous Once  . chlorhexidine gluconate (MEDLINE KIT)  15 mL Mouth Rinse BID  . Chlorhexidine Gluconate Cloth  6 each Topical Daily  . docusate sodium  200 mg Oral Daily  . free water  200 mL Per Tube Q4H  . insulin aspart  0-24 Units Subcutaneous Q4H  . levothyroxine  50 mcg Oral Q0600  . mouth rinse  15 mL Mouth Rinse 10 times per day  . pantoprazole (PROTONIX) IV  40 mg Intravenous QHS  . sodium chloride flush  10-40 mL Intracatheter Q12H  . sodium chloride flush  3 mL Intravenous Q12H   Infusions:  . sodium chloride Stopped (11/06/2019 1900)  . sodium chloride 10 mL/hr at 10/24/19 0000  . sodium chloride 20 mL/hr at 10/24/19 0400  . albumin human Stopped (10/24/19 1718)  . albumin human Stopped (11/04/2019 1900)  . dexmedetomidine (PRECEDEX) IV infusion 0.8 mcg/kg/hr (10/24/19 1800)  . dextrose 40 mL/hr at 10/24/19 1800  . dextrose 5 % and 0.45% NaCl Stopped (10/16/2019 1900)  . epinephrine Stopped (11/01/2019 0505)  . feeding supplement (PIVOT 1.5 CAL) 1,000 mL (10/24/19 1316)  . furosemide (LASIX) infusion 10 mg/hr (10/24/19 1800)  . heparin 500 Units/hr (10/24/19 1800)  . insulin Stopped  (11/08/2019 1458)  . [START ON 10/25/2019] lacosamide (VIMPAT) IV    . lactated ringers    . lactated ringers    . lactated ringers 100 mL/hr at 11/05/2019 0558  . [START ON 10/25/2019] levETIRAcetam    . meropenem (MERREM) IV Stopped (10/24/19 1123)  . milrinone 0.5 mcg/kg/min (10/24/19 1800)  . niCARDipine Stopped (10/24/19 0818)  . nitroGLYCERIN    . norepinephrine (LEVOPHED) Adult infusion Stopped (10/24/19 0806)  . phenylephrine (NEO-SYNEPHRINE) Adult infusion Stopped (10/18/2019 2236)  . propofol (DIPRIVAN) infusion 10 mcg/kg/min (10/24/19 1800)  . vancomycin    . vancomycin Stopped (10/24/19 1445)    Assessment: 49 y.o. female admitted on 10/30/2019 with TV repair. Postop course has been complicated by hemorrhagic shock and [facility] ECMO was initiated later on 4/7. Back to OR for re-exploration on 4/8 and 4/9 for washout. Patient remains with open chest.   After discussion with ECMO team, pharmacy has been consulted to initiate low dose heparin for ECMO circuit - no bolus.  Heparin level is undetectable, on 500 units/hr. APTT is 65. Hgb 8.2, plt down to 47. No s/sx of bleeding per RN. Flows improving and fibrin in circuit stable. No infusion issues.   Goal of Therapy:  Heparin level 0.3-0.7 units/ml - will hold off targeting heparin level goals for now given recent hemorrhagic shock. Trickle heparin to keep circuit from clotting for now. Reassess with ECMO team daily to assess for dosing adjustments Monitor platelets by anticoagulation protocol: Yes   Plan:  Will continue heparin at 500 units/hr - will defer to MD if want to titrate further Check heparin level q12h, aPTT q6h, and CBC q12h Monitor for bleeding  Antonietta Jewel, PharmD, [place] Pharmacist  Phone: [phone] 10/24/2019 7:16 PM  Please check AMION for all [place] phone numbers After 10:00 PM, call [place]  [phone]  

## 2019-10-24 NOTE — Progress Notes (Signed)
Consult received.  Epic reviewed.  Discussed with Dr. Orvan Seen and bedside RN.  Examined patient.   Left voice mail for husband to meet in the hospital tomorrow (4/11).  Will look forward to discussing with attending team again in the am before meeting with family.  Florentina Jenny, PA-C Palliative Medicine Office:  [phone]

## 2019-10-24 NOTE — Progress Notes (Signed)
Pt transported from 2H3 to CT2 and back on the ventilator, Nitric 23PPM and ECMO. Pt transported with RTx2, RN x2 and ECMO specialists x2 and transport. Pt tolerated well. RT will continue to monitor.

## 2019-10-24 NOTE — Progress Notes (Signed)
Pt transferred from 2H03 to CT and returned to 2H03 by [facility] specialist x 2, RN x 2, RT x 2, and transport. Flows, oxygenation and vitals stable. No adverse events during transport. Emergency equipment available. 2 Units of blood available. Pt Cardiohelp reconnected to wall a/c and oxygen connected to wall.

## 2019-10-24 NOTE — Consult Note (Addendum)
Consultation Note Date: 10/25/2019   Patient Name: [name]  DOB: [birth year]  MRN: [id number]  Age / Sex: 49 y.o., female  PCP: [name], MD Referring Physician: Rexene Alberts, MD  Reason for Consultation: ECMO consult  HPI/Patient Profile: 49 y.o. 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   In the OR she developed severe bleeding and hemorrhagic shock.  She subsequently was placed on ECMO.  She made another trip back to the OR for evacuation of hemothorax.  She subsequently developed status epilepticus.  After treatment with dual antiseizure medications and sedation the status stopped and she has been having intermittent seizing episodes since.  There is some concern for a degree of anoxic brain injury.  She is currently slated for ECMO decannulation on 4/12 am.  Clinical Assessment and Goals of Care:  I have reviewed medical records including EPIC notes, labs and imaging, discussed the patient with Dr. Orvan Seen and the bedside RNs, examined the patient and met at bedside with her husband Jeneen Rinks and brother Harrie Jeans to discuss diagnosis prognosis, [place], EOL wishes, disposition and options.  I introduced Palliative Medicine as specialized medical care for people living with serious illness. We are an extra layer of support working in concert with the primary medical team.  We discussed a brief life review of the patient. Ramanda is a very driven woman.  What ever she puts her mind to will be done.  Her husband and brother tell me that she is active in her sorority, on multiple committees at [employer], has a very demanding job with [employer], volunteers  for multiple other organizations, is working on her 3rd masters degree and has two children ages 34 and 70.  She was raised in a military family and lived all over the country.  She is a strong Methodist, and a very loving person who lives to please and serve others.  As far as functional and nutritional status, history in Dr. Ricard Dillon note indicates that she had an unintentional weight loss of 60 lbs in the last 6 months.  She did not exercise and had been experiencing exertional dyspnea.  We discussed her current illness and what it means in the larger context of her on-going co-morbidities.  Keyarra's body has been thru a great deal.    Advanced directives and feelings about EOL care were discussed.  Ironically Jeneen Rinks and Izzabella just bought burial plots in [place].  The sales process included a service that discussed Advanced Directives.  This prompted Jeneen Rinks and Annaleia to ponder the idea - but they did not take it too seriously as they felt they had 40 years to worry about it.  Jeneen Rinks states that he knows Grace would never want to live a life on machines.  After some conversation he states that QOL for  Tillie would include being able to think clearly and communicate with others.  It is important to Zanetta to be of service to others - that is what gives her drive.  Jeneen Rinks asked questions primarily around how to talk to his 5 yo daughter and 55 yo son.  I committed to find resources for him to address that concern  Finally Jeneen Rinks asks for a chaplain to pray with his wife daily and before each surgery.  Questions and concerns were addressed.  The family was encouraged to call with questions or concerns.   Primary Decision Maker:  NEXT OF KIN Husband James.    SUMMARY OF RECOMMENDATIONS   1.  Established rapport and began conversations about needed support,  next steps, anticipatory needs and disposition. 2.  Chaplain requested to pray with/over patient daily and before each surgical  procedure. 3.  PMT will follow along in the chart and touch base with the family to maintain a supportive relationship.    Code Status/Advance Care Planning:  Full.   Patient would not want "Long term" life support.  Wants to be able to speak and interact with others- that is her quality of life..  Symptom Management:   Per primary and neuro.   Psycho-social/Spiritual:   Desire for further Chaplaincy support: requested.  Prognosis:  Unable to determine.    Discharge Planning: To Be Determined      Primary Diagnoses: Present on Admission: . Tricuspid regurgitation . Pulmonary hypertension ([place]) . Lupus ([place]) . Obesity . Coagulopathy ([place])   I have reviewed the medical record, interviewed the patient and family, and examined the patient. The following aspects are pertinent.  Past Medical History:  Diagnosis Date  . Avascular necrosis ([place])   . Diabetes mellitus without complication ([place]) 38/1017  . Hypertension   . Hypothyroidism 01/2019  . Lupus ([place])   . Lupus nephritis ([place])   . Prediabetes 10/2011  . Pulmonary hypertension ([place])   . S/P minimally invasive tricuspid valve repair 10/27/2019   Complex valvuloplasty including autologous pericardial patch augmentation with suture plication of commissures and 32 mm Edwards mc3 ring annuloplasty via right mini thoracotomy approach  . Tricuspid regurgitation   . 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 Marital status: Married    Spouse name: Not on file  . Number of children: 2  . Years of education: Not on file  . Highest education level: Not on file  Occupational History  . Not on file  Tobacco Use  . Smoking status: Never Smoker  . Smokeless tobacco: Never Used  Substance and Sexual Activity  . Alcohol use: No  . Drug use: No  . Sexual activity: Yes    Partners: Male    Birth control/protection: Pill, None    Comment: LoLoestrin Fe   Other Topics  Concern  . Not on file  Social History Narrative  . Not on file   Social Determinants of Health   Financial Resource Strain:   . Difficulty of Paying Living Expenses:   Food Insecurity:   . Worried About Charity fundraiser in the Last Year:   . Arboriculturist in the Last Year:   Transportation Needs:   . Film/video editor (Medical):   Marland Kitchen Lack of Transportation (Non-Medical):   Physical Activity:   . Days of Exercise per Week:   . Minutes of Exercise per Session:   Stress:   . Feeling of Stress :  Social Connections:   . Frequency of Communication with Friends and Family:   . Frequency of Social Gatherings with Friends and Family:   . Attends Religious Services:   . Active Member of Clubs or Organizations:   . Attends Archivist Meetings:   Marland Kitchen Marital Status:    Family History  Problem Relation Age of Onset  . Breast cancer Mother   . Heart disease Mother   . Cancer Mother   . Breast cancer Maternal Grandmother   . Cancer Maternal Grandmother   . Clotting disorder Father   . Hypertension Father   . Anemia Father   . Sleep apnea Father   . Cancer Maternal Grandfather    Scheduled Meds: . sodium chloride   Intravenous Once  . sodium chloride   Intravenous Once  . sodium chloride   Intravenous Once  . sodium chloride   Intravenous Once  . calcium chloride      . chlorhexidine gluconate (MEDLINE KIT)  15 mL Mouth Rinse BID  . Chlorhexidine Gluconate Cloth  6 each Topical Daily  . docusate sodium  200 mg Oral Daily  . EPINEPHrine      . free water  200 mL Per Tube Q4H  . insulin aspart  0-24 Units Subcutaneous Q4H  . levothyroxine  50 mcg Oral Q0600  . mouth rinse  15 mL Mouth Rinse 10 times per day  . pantoprazole (PROTONIX) IV  40 mg Intravenous QHS  . phenylephrine      . sodium bicarbonate      . sodium chloride flush  10-40 mL Intracatheter Q12H  . sodium chloride flush  3 mL Intravenous Q12H   Continuous Infusions: . sodium chloride  Stopped (10/31/2019 1900)  . sodium chloride 10 mL/hr at 10/24/19 0000  . sodium chloride 20 mL/hr at 10/24/19 0400  . albumin human Stopped (10/24/19 1203)  . albumin human Stopped (11/06/2019 1900)  . albumin human    . dexmedetomidine (PRECEDEX) IV infusion 0.8 mcg/kg/hr (10/24/19 1500)  . dextrose 40 mL/hr at 10/24/19 1500  . dextrose 5 % and 0.45% NaCl Stopped (10/27/2019 1900)  . epinephrine Stopped (10/27/2019 0505)  . feeding supplement (PIVOT 1.5 CAL) 1,000 mL (10/24/19 1316)  . furosemide (LASIX) infusion 10 mg/hr (10/24/19 1500)  . heparin 500 Units/hr (10/24/19 1500)  . insulin Stopped (10/27/2019 1458)  . [START ON 10/25/2019] lacosamide (VIMPAT) IV    . lacosamide (VIMPAT) IV 200 mg (10/24/19 1505)  . lactated ringers    . lactated ringers    . lactated ringers 100 mL/hr at 11/08/2019 0558  . [START ON 10/25/2019] levETIRAcetam    . meropenem (MERREM) IV Stopped (10/24/19 1123)  . milrinone 0.5 mcg/kg/min (10/24/19 1514)  . niCARDipine Stopped (10/24/19 0818)  . nitroGLYCERIN    . norepinephrine (LEVOPHED) Adult infusion Stopped (10/24/19 0806)  . phenylephrine (NEO-SYNEPHRINE) Adult infusion Stopped (11/04/2019 2236)  . propofol (DIPRIVAN) infusion 10 mcg/kg/min (10/24/19 1500)  . vancomycin    . vancomycin Stopped (10/24/19 1445)   PRN Meds:.sodium chloride, sodium chloride, albumin human, artificial tears, dextrose, lactated ringers, metoprolol tartrate, midazolam, morphine injection, ondansetron (ZOFRAN) IV, oxyCODONE, sodium chloride flush, sodium chloride flush, traMADol Allergies  Allergen Reactions  . Lisinopril     angioedema  . Penicillins Hives    DID THE REACTION INVOLVE: Swelling of the face/tongue/throat, SOB, or low BP? Unknown Sudden or severe rash/hives, skin peeling, or the inside of the mouth or nose? Unknown Did it require medical treatment? Unknown When did  it last happen?age 62 If all above answers are "NO", may proceed with cephalosporin use.        Vital Signs: BP (!) 83/71   Pulse 80   Temp 97.7 F (36.5 C)   Resp (!) 0   Ht _0  (1.702 m)   Wt 96.3 kg Comment: one pillow  LMP 04/02/2017   SpO2 100%   BMI 33.25 kg/m  Pain Scale: CPOT   Pain Score: 0-No pain   SpO2: SpO2: 100 % O2 Device:SpO2: 100 % O2 Flow Rate: .O2 Flow Rate (L/min): 40 L/min  IO: Intake/output summary:   Intake/Output Summary (Last 24 hours) at 10/24/2019 1521 Last data filed at 10/24/2019 1500 Gross per 24 hour  Intake 4663.64 ml  Output 6565 ml  Net -1901.36 ml    LBM: Last BM Date: (UTA) Baseline Weight: Weight: 71 kg Most recent weight: Weight: 96.3 kg(one pillow)     Palliative Assessment/Data: 10%     Time In: 1:15 Time Out: 2:30 Time Total: 75 min. Visit consisted of counseling and education dealing with the complex and emotionally intense issues surrounding the need for palliative care and symptom management in the setting of serious and potentially life-threatening illness. Greater than 50%  of this time was spent counseling and coordinating care related to the above assessment and plan.  Signed by: Florentina Jenny, PA-C Palliative Medicine  Please contact Palliative Medicine Team phone at [phone] for questions and concerns.  For individual provider: See Shea Evans

## 2019-10-24 NOTE — Progress Notes (Addendum)
ANTICOAGULATION CONSULT NOTE - Initial Consult  Pharmacy Consult for heparin Indication: ECMO  Allergies  Allergen Reactions  . Lisinopril     angioedema  . Penicillins Hives    DID THE REACTION INVOLVE: Swelling of the face/tongue/throat, SOB, or low BP? Unknown Sudden or severe rash/hives, skin peeling, or the inside of the mouth or nose? Unknown Did it require medical treatment? Unknown When did it last happen?age 49 If all above answers are "NO", may proceed with cephalosporin use.     Patient Measurements: Height: '5\' 7"'$  (170.2 cm) Weight: 96.3 kg (212 lb 4.9 oz)(one pillow) IBW/kg (Calculated) : 61.6 Heparin Dosing Weight: 71 kg  Vital Signs: Temp: 97.5 F (36.4 C) (04/10 0700)  Labs: Recent Labs    10/17/2019 1107 10/24/2019 1407 10/20/2019 1632 10/25/2019 1633 11/06/2019 2202 10/24/2019 2209 10/20/2019 0512 11/12/2019 0512 11/06/2019 0524 10/20/2019 1125 11/11/2019 1134 11/07/2019 1134 11/02/2019 1752 10/25/2019 1803 10/28/2019 2009 10/24/2019 2009 10/24/19 0356 10/24/19 0407  HGB 3.9*   < > 11.4*   < > 8.0*   < > 8.0*   < >   < > 7.8* 8.4*   < > 11.0*   < > 10.2*   < > 10.1* 8.2*  HCT 11.7*   < > 32.4*   < > 22.7*   < > 22.6*   < >   < > 23.0* 23.8*   < > 31.3*   < > 30.0*  --  29.3* 24.0*  PLT 170   < > 100*   < > 61*   < > 64*   < >  --   --  51*  --  86*  --   --   --  68*  --   APTT 42*   < > 40*   < > 43*   < > 39*  --   --   --   --   --  39*  --   --   --  39*  --   LABPROT 18.0*   < >  --   --  17.7*   < > 17.4*  --   --   --   --   --  17.1*  --   --   --  16.0*  --   INR 1.5*   < >  --   --  1.5*   < > 1.4*  --   --   --   --   --  1.4*  --   --   --  1.3*  --   HEPARINUNFRC <0.10*  --  <0.10*  --  <0.10*  --   --   --   --   --   --   --   --   --   --   --   --   --   CREATININE 1.42*   < > 1.36*   < > 1.78*   < > 2.04*  --    < > 2.00*  --   --  2.25*  --   --   --  2.49*  --    < > = values in this interval not displayed.    Estimated Creatinine Clearance: 33.6  mL/min (A) (by C-G formula based on SCr of 2.49 mg/dL (H)).   Medical History: Past Medical History:  Diagnosis Date  . Avascular necrosis ([place])   . Diabetes mellitus without complication ([place]) 16/1096  . Hypertension   . Hypothyroidism 01/2019  .  Lupus ([place])   . Lupus nephritis ([place])   . Prediabetes 10/2011  . Pulmonary hypertension ([place])   . 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 Tricuspid regurgitation   . 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 sodium chloride   Intravenous Once  . sodium chloride   Intravenous Once  . sodium chloride   Intravenous Once  . sodium chloride   Intravenous Once  . chlorhexidine gluconate (MEDLINE KIT)  15 mL Mouth Rinse BID  . Chlorhexidine Gluconate Cloth  6 each Topical Daily  . docusate sodium  200 mg Oral Daily  . heparin      . insulin aspart  0-24 Units Subcutaneous Q4H  . levothyroxine  50 mcg Oral Q0600  . mouth rinse  15 mL Mouth Rinse 10 times per day  . pantoprazole (PROTONIX) IV  40 mg Intravenous QHS  . sodium chloride flush  10-40 mL Intracatheter Q12H  . sodium chloride flush  3 mL Intravenous Q12H   Infusions:  . sodium chloride Stopped (11/05/2019 1900)  . sodium chloride 10 mL/hr at 10/24/19 0000  . sodium chloride 20 mL/hr at 10/24/19 0400  . albumin human Stopped (10/24/19 0421)  . albumin human Stopped (11/06/2019 1900)  . dexmedetomidine (PRECEDEX) IV infusion 0.7 mcg/kg/hr (10/24/19 0700)  . dextrose 40 mL/hr at 10/24/19 0700  . dextrose 5 % and 0.45% NaCl Stopped (10/20/2019 1900)  . epinephrine Stopped (11/08/2019 0505)  . furosemide (LASIX) infusion 10 mg/hr (10/24/19 0700)  . heparin    . insulin Stopped (10/28/2019 1458)  . lactated ringers    . lactated ringers    .  lactated ringers 100 mL/hr at 10/25/2019 0558  . meropenem (MERREM) IV Stopped (10/20/2019 2148)  . milrinone 0.5 mcg/kg/min (10/24/19 0700)  . niCARDipine Stopped (10/24/19 0552)  . nitroGLYCERIN    . norepinephrine (LEVOPHED) Adult infusion 1 mcg/min (10/24/19 0700)  . phenylephrine (NEO-SYNEPHRINE) Adult infusion Stopped (11/01/2019 2236)  . vancomycin    . vancomycin Stopped (10/17/2019 1900)    Assessment: 49 y.o. female admitted on 11/11/2019 with TV repair. Postop course has been complicated by hemorrhagic shock and [facility] ECMO was initiated later on 4/7. Back to OR for re-exploration on 4/8 and 4/9 for washout. Patient remains with open chest.   After discussion with ECMO team, pharmacy has been consulted to initiate low dose heparin for ECMO circuit - no bolus.  Goal of Therapy:  Heparin level 0.3-0.7 units/ml - will hold off targeting heparin level goals for now given recent hemorrhagic shock. Trickle heparin to keep circuit from clotting for now. Reassess with ECMO team daily to assess for dosing adjustments Monitor platelets by anticoagulation protocol: Yes   Plan:  Start heparin IV at 500 units/hr Check heparin level q12h, aPTT q6h, and CBC q12h Monitor for bleeding  Vertis Kelch, PharmD, [facility] PGY2 Cardiology Pharmacy Resident Phone [phone] 10/24/2019       7:26 AM  Please check [email or web address] for unit-specific pharmacist phone numbers

## 2019-10-24 NOTE — Progress Notes (Signed)
Orthopedic Tech Progress Note Patient Details:  [name] Oct 25, 1970 [id number]  Ortho Devices Type of Ortho Device: Haematologist Ortho Device/Splint Location: BLE Ortho Device/Splint Interventions: Ordered, Application   Post Interventions Patient Tolerated: Well Instructions Provided: Care of [place] 10/24/2019, 6:42 PM

## 2019-10-24 NOTE — Progress Notes (Signed)
MD notified of ETT needing to have air added to cuff throughout day. Tidal volumes and SpO2 have not been effected by this. MD aware, no new orders at this time. RT will continue to monitor.

## 2019-10-24 NOTE — Progress Notes (Addendum)
LTM EEG hooked up and running - no initial skin breakdown - push button tested - neuro notified. Same lead used

## 2019-10-24 NOTE — Progress Notes (Signed)
Spoke with neurology MD, informed of pt increase in seizure activity on EEG. Propofol drip to be increased to 42mcg/kg/min at this time and then increased to 23mcg/kg/min in one hour.

## 2019-10-24 NOTE — Plan of Care (Signed)
  Problem: Education: Goal: Will demonstrate proper wound care and an understanding of methods to prevent future damage Outcome: Not Progressing Goal: Knowledge of disease or condition will improve Outcome: Not Progressing Goal: Knowledge of the prescribed therapeutic regimen will improve Outcome: Not Progressing Goal: Individualized Educational Video(s) Outcome: Not Progressing   Problem: Activity: Goal: Risk for activity intolerance will decrease Outcome: Not Progressing   Problem: Cardiac: Goal: Will achieve and/or maintain hemodynamic stability Outcome: Not Progressing   Problem: Clinical Measurements: Goal: Postoperative complications will be avoided or minimized Outcome: Not Progressing   Problem: Respiratory: Goal: Respiratory status will improve Outcome: Not Progressing   Problem: Skin Integrity: Goal: Wound healing without signs and symptoms of infection Outcome: Not Progressing Goal: Risk for impaired skin integrity will decrease Outcome: Not Progressing   Problem: Urinary Elimination: Goal: Ability to achieve and maintain adequate renal perfusion and functioning will improve Outcome: Not Progressing   Problem: Education: Goal: Knowledge of General Education information will improve Description: Including pain rating scale, medication(s)/side effects and non-pharmacologic comfort measures Outcome: Not Progressing   Problem: Health Behavior/Discharge Planning: Goal: Ability to manage health-related needs will improve Outcome: Not Progressing   Problem: Clinical Measurements: Goal: Ability to maintain clinical measurements within normal limits will improve Outcome: Not Progressing Goal: Will remain free from infection Outcome: Not Progressing Goal: Diagnostic test results will improve Outcome: Not Progressing Goal: Respiratory complications will improve Outcome: Not Progressing Goal: Cardiovascular complication will be avoided Outcome: Not Progressing    Problem: Activity: Goal: Risk for activity intolerance will decrease Outcome: Not Progressing   Problem: Nutrition: Goal: Adequate nutrition will be maintained Outcome: Not Progressing   Problem: Coping: Goal: Level of anxiety will decrease Outcome: Not Progressing   Problem: Elimination: Goal: Will not experience complications related to bowel motility Outcome: Not Progressing Goal: Will not experience complications related to urinary retention Outcome: Not Progressing   Problem: Pain Managment: Goal: General experience of comfort will improve Outcome: Not Progressing   Problem: Safety: Goal: Ability to remain free from injury will improve Outcome: Not Progressing   Problem: Skin Integrity: Goal: Risk for impaired skin integrity will decrease Outcome: Not Progressing

## 2019-10-24 NOTE — Consult Note (Addendum)
NEURO HOSPITALIST CONSULT NOTE   Requesting physician: Dr. Roxy Manns   Reason for Consult:seizures   History obtained from:  Chart review HPI:                                                                                                                                          [name] is an 49 y.o. 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 4/10 neurology consulted for seizures.   Overnight RN noted right facial twitching. Neurology consulted for seizure vs myoclonus.  Hospital course: 4/7 admitted for tricuspid valve repair; increased CT output, taken back to OR for reexploration right chest tube. Patient given 2 units PRBC, cryo, 2 units FFP 4/8 re-exploration of open heart procedure d/t bleeding,  initiated of ECMO 4/10 neurology consulted Past Medical History:  Diagnosis Date  . Avascular necrosis ([place])   . Diabetes mellitus without complication ([place]) 70/4888  . Hypertension   . Hypothyroidism 01/2019  . Lupus ([place])   . Lupus nephritis ([place])   . Prediabetes 10/2011  . Pulmonary hypertension ([place])   . S/P minimally invasive tricuspid valve repair 10/18/2019   Complex valvuloplasty including autologous pericardial patch augmentation with suture plication of commissures and 32 mm Edwards mc3 ring annuloplasty via right mini thoracotomy approach  . Tricuspid regurgitation   . 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 bilat hip grafts     2007  . BUNIONECTOMY  2018  . CANNULATION FOR ECMO (EXTRACORPOREAL MEMBRANE OXYGENATION) BEDSIDE N/A 11/01/2019   Procedure: Cannulation For Ecmo (Extracorporeal Membrane Oxygenation);  Surgeon: Rexene Alberts, MD;  Location: [place];  Service: Open Heart Surgery;  Laterality: N/A;   . EXPLORATION POST OPERATIVE OPEN HEART N/A 11/01/2019   Procedure: Re-exploration of post-operative open heart procedure for bleeding;  Surgeon: Rexene Alberts, MD;  Location: [place];  Service: Open Heart Surgery;  Laterality: N/A;  . HIP SURGERY     x2  . MINIMALLY INVASIVE TRICUSPID VALVE REPAIR Right 10/18/2019   Procedure: MINIMALLY INVASIVE TRICUSPID VALVE REPAIR USING MC3 TRICUSPID ANNULOPLASTY RING;  Surgeon: Rexene Alberts, MD;  Location: [place];  Service: Open Heart Surgery;  Laterality: Right;  . RIGHT HEART CATH N/A 01/02/2019   Procedure: RIGHT HEART CATH;  Surgeon: Jolaine Artist, MD;  Location: [facility];  Service: Cardiovascular;  Laterality: N/A;  . RIGHT/LEFT HEART CATH AND CORONARY ANGIOGRAPHY N/A 07/11/2018   Procedure: RIGHT/LEFT HEART CATH AND CORONARY ANGIOGRAPHY;  Surgeon: Jolaine Artist, MD;  Location: [place] [facility];  Service: Cardiovascular;  Laterality: N/A;  . SHOULDER  SURGERY Bilateral 2015   Core displacement  . TEE WITHOUT CARDIOVERSION N/A 08/07/2019   Procedure: TRANSESOPHAGEAL ECHOCARDIOGRAM (TEE);  Surgeon: Jolaine Artist, MD;  Location: [facility] END[facility];  Service: Cardiovascular;  Laterality: N/A;  . TEE WITHOUT CARDIOVERSION N/A 11/04/2019   Procedure: TRANSESOPHAGEAL ECHOCARDIOGRAM (TEE);  Surgeon: Rexene Alberts, MD;  Location: [place];  Service: Open Heart Surgery;  Laterality: N/A;  . TOTAL HIP ARTHROPLASTY  08/2016 and 10/2016   x2  . UMBILICAL HERNIA REPAIR      Family History  Problem Relation Age of Onset  . Breast cancer Mother   . Heart disease Mother   . Cancer Mother   . Breast cancer Maternal Grandmother   . Cancer Maternal Grandmother   . Clotting disorder Father   . Hypertension Father   . Anemia Father   . Sleep apnea Father   . Cancer Maternal Grandfather       Social History:  reports that she has never smoked. She has never used smokeless tobacco. She reports that she does not drink alcohol or use  drugs.  Allergies  Allergen Reactions  . Lisinopril     angioedema  . Penicillins Hives    DID THE REACTION INVOLVE: Swelling of the face/tongue/throat, SOB, or low BP? Unknown Sudden or severe rash/hives, skin peeling, or the inside of the mouth or nose? Unknown Did it require medical treatment? Unknown When did it last happen?age 53 If all above answers are "NO", may proceed with cephalosporin use.     MEDICATIONS:                                                                                                                     Scheduled: . sodium chloride   Intravenous Once  . sodium chloride   Intravenous Once  . sodium chloride   Intravenous Once  . sodium chloride   Intravenous Once  . calcium chloride      . chlorhexidine gluconate (MEDLINE KIT)  15 mL Mouth Rinse BID  . Chlorhexidine Gluconate Cloth  6 each Topical Daily  . docusate sodium  200 mg Oral Daily  . EPINEPHrine      . free water  200 mL Per Tube Q4H  . insulin aspart  0-24 Units Subcutaneous Q4H  . levothyroxine  50 mcg Oral Q0600  . mouth rinse  15 mL Mouth Rinse 10 times per day  . pantoprazole (PROTONIX) IV  40 mg Intravenous QHS  . phenylephrine      . sodium bicarbonate      . sodium chloride flush  10-40 mL Intracatheter Q12H  . sodium chloride flush  3 mL Intravenous Q12H   Continuous: . sodium chloride Stopped (11/06/2019 1900)  . sodium chloride 10 mL/hr at 10/24/19 0000  . sodium chloride 20 mL/hr at 10/24/19 0400  . albumin human Stopped (10/24/19 0421)  . albumin human Stopped (11/06/2019 1900)  . albumin human    . dexmedetomidine (PRECEDEX) IV infusion 0.8 mcg/kg/hr (10/24/19 0900)  .  dextrose 40 mL/hr at 10/24/19 0905  . dextrose 5 % and 0.45% NaCl Stopped (11/03/2019 1900)  . epinephrine Stopped (11/09/2019 0505)  . feeding supplement (PIVOT 1.5 CAL)    . furosemide (LASIX) infusion 10 mg/hr (10/24/19 0900)  . heparin 500 Units/hr (10/24/19 1048)  . insulin Stopped (10/27/2019 1458)   . lactated ringers    . lactated ringers    . lactated ringers 100 mL/hr at 10/18/2019 0558  . meropenem (MERREM) IV 2 g (10/24/19 1053)  . milrinone 0.5 mcg/kg/min (10/24/19 0900)  . niCARDipine Stopped (10/24/19 0818)  . nitroGLYCERIN    . norepinephrine (LEVOPHED) Adult infusion Stopped (10/24/19 0806)  . phenylephrine (NEO-SYNEPHRINE) Adult infusion Stopped (10/15/2019 2236)  . vancomycin    . vancomycin Stopped (10/20/2019 1900)   SWN:IOEVOJ chloride, sodium chloride, albumin human, artificial tears, dextrose, lactated ringers, metoprolol tartrate, midazolam, morphine injection, ondansetron (ZOFRAN) IV, oxyCODONE, sodium chloride flush, sodium chloride flush, traMADol   ROS:                                                                                                                                        unobtainable from patient due to mental status   Blood pressure (!) 83/71, pulse 80, temperature 97.7 F (36.5 C), temperature source Core, resp. rate (!) 0, height 5' 7" (1.702 m), weight 96.3 kg, last menstrual period 04/02/2017, SpO2 100 %.   General Examination:                                                                                                       Physical Exam  Constitutional: Appears well-developed and well-nourished.  Eyes: Normal external eye and conjunctiva. HENT: Normocephalic, no lesions, without obvious abnormality.   Musculoskeletal- generalized edema Cardiovascular: ECMO  Respiratory: Vented GI: Soft.  No distension. There is no tenderness.  Skin: WD, open chest  Neurological Examination on precedex and milirinone Mental Status: Patient does not respond to verbal stimuli.  Unable to deep sternal rub d/t open chest.  Does not follow commands.  No verbalizations noted.  Right cheek and eye did note some twitching. Breathing 1 above vent settings. Cranial Nerves: II: patient does not respond confrontation bilaterally,  III,IV,VI:pupils right 4 mm,  left 4 mm,and sluggishly responsive  bilaterally,  V,VII: corneal reflex present bilaterally  VIII: patient does not respond to verbal stimuli IX,X: gag reflex present, XI: trapezius strength unable to test bilaterally XII: tongue strength unable to test Motor:   No spontaneous  movement noted.  No purposeful movements noted. Sensory: Does not respond to noxious stimuli in any extremity. Plantars: muted Cerebellar: Unable to perform     Lab Results: Basic Metabolic Panel: Recent Labs  Lab 10/25/2019 0334 10/28/2019 0636 10/28/2019 1632 10/25/2019 1633 11/02/2019 2202 11/09/2019 2209 10/20/2019 0512 11/13/2019 0524 10/30/2019 0735 11/12/2019 0735 10/15/2019 1125 11/04/2019 1125 11/07/2019 1752 11/04/2019 1803 10/20/2019 2009 10/24/19 0356 10/24/19 0407  NA 149*   < > 152*   < > 150*   < > 152*   < > 150*   < > 151*   < > 151* 150* 150* 150* 149*  K 4.5   < > 3.7   < > 4.2   < > 4.1   < > 4.0   < > 3.6   < > 3.9 3.8 3.6 4.0 3.9  CL 118*   < > 120*   < > 113*   < > 115*  --  112*  --  113*  --  117*  --   --  115*  --   CO2 17*   < > 23  --  25  --  25  --   --   --   --   --  25  --   --  25  --   GLUCOSE 168*   < > 126*   < > 109*   < > 74  --  82  --  78  --  68*  --   --  86  --   BUN 13   < > 15   < > 20   < > 24*  --  24*  --  22*  --  28*  --   --  31*  --   CREATININE 1.16*   < > 1.36*   < > 1.78*   < > 2.04*  --  2.10*  --  2.00*  --  2.25*  --   --  2.49*  --   CALCIUM 5.6*   < > 6.3*   < > 7.4*   < > 7.4*  --   --   --   --   --  7.3*  --   --  7.7*  --   MG 1.3*  --  1.6*  --   --   --   --   --   --   --   --   --  2.2  --   --  2.1  --   PHOS  --   --   --   --   --   --   --   --   --   --   --   --   --   --   --  6.6*  --    < > = values in this interval not displayed.    CBC: Recent Labs  Lab 10/24/2019 1749 10/20/2019 2028 11/12/2019 2202 10/19/2019 2209 11/01/2019 0512 11/03/2019 0524 10/19/2019 1134 10/27/2019 1134 10/17/2019 1752 11/06/2019 1803 11/02/2019 2009 10/24/19 0356  10/24/19 0407  WBC 3.0*   < > 2.9*  --  3.6*  --  4.3  --  3.3*  --   --  5.7  --   NEUTROABS 2.5  --   --   --   --   --   --   --   --   --   --   --   --  HGB 7.7*   < > 8.0*   < > 8.0*   < > 8.4*   < > 11.0* 9.2* 10.2* 10.1* 8.2*  HCT 23.3*   < > 22.7*   < > 22.6*   < > 23.8*   < > 31.3* 27.0* 30.0* 29.3* 24.0*  MCV 86.6   < > 83.2  --  83.1  --  83.2  --  86.2  --   --  86.7  --   PLT 77*   < > 61*  --  64*  --  51*  --  86*  --   --  68*  --    < > = values in this interval not displayed.    Imaging: DG CHEST PORT 1 VIEW  Result Date: 10/24/2019 CLINICAL DATA:  ECMO EXAM: PORTABLE CHEST 1 VIEW COMPARISON:  10/29/2019 FINDINGS: Support devices and tubing are unchanged. Diffuse airspace disease throughout the right lung stable. No confluent opacity on the left. No pneumothorax. IMPRESSION: Stable support devices.  Stable right lung airspace disease. Electronically Signed   By: Rolm Baptise M.D.   On: 11/05/2019 18:38   DG CHEST PORT 1 VIEW  Result Date: 11/02/2019 CLINICAL DATA:  Respiratory failure. ECMO EXAM: PORTABLE CHEST 1 VIEW COMPARISON:  10/18/2019 FINDINGS: The endotracheal tube, NG tube, mediastinal drain tubes and chest tubes appears stable. The ECMO catheters are stable. No pneumothorax is identified. Slight worsening lung aeration with progressive airspace consolidation most notably in the right lung. IMPRESSION: 1. Stable support apparatus. 2. Worsening lung aeration with progressive airspace consolidation. Electronically Signed   By: Marijo Sanes M.D.   On: 11/07/2019 08:23   ECHO INTRAOPERATIVE TEE  Result Date: 10/27/2019  *INTRAOPERATIVE TRANSESOPHAGEAL REPORT *  Patient Name:   [name] Date of Exam: 10/28/2019 Medical Rec #:  [id number]          Height:       67.0 in Accession #:    [id number]         Weight:       218.3 lb Date of Birth:  1970/11/10           BSA:          2.10 m Patient Age:    52 years           BP:           83/71 mmHg Patient Gender: F                   HR:           89 bpm. Exam Location:  Inpatient Transesophogeal exam was perform intraoperatively during surgical procedure. Patient was closely monitored under general anesthesia during the entirety of examination. Indications:     limited intra-op TEE for ECMO assessment, possible removal of                  ECMO Performing Phys: [place] TRIGT Diagnosing Phys: Annye Asa MD Complications: No known complications during this procedure. PRE-OP FINDINGS  Left Ventricle: The left ventricle has mild-moderately reduced systolic function, with an ejection fraction of approximately 40-45%, calculated 40%. The cavity size was mildly dilated. There is no increase in left ventricular wall thickness. There is the interventricular septum is flattened in diastole ('D' shaped left ventricle), consistent with right ventricular volume overload. Left ventricular diffuse mild hypokinesis. Right Ventricle: The right ventricle has moderately reduced systolic function. The cavity was normal. There is no  increase in right ventricular wall thickness. Right ventricular systolic pressure could not be assessed. Left Atrium: Left atrial size was normal in size. The left atrial appendage is well visualized and there is no evidence of thrombus present. Left atrial appendage velocity is normal at greater than 40 cm/s. Right Atrium: Right atrial size is dilated. The large ECMO venous cannula is present in the RA. Interatrial Septum: No atrial level shunt detected by color flow Doppler. Pericardium: There is no evidence of pericardial effusion. Mitral Valve: The mitral valve is normal in structure. No thickening of the mitral valve leaflet. No calcification of the mitral valve leaflet. Mitral valve regurgitation is trivial by color flow Doppler. There is no evidence of mitral valve vegetation. Tricuspid Valve: The tricuspid valve was s/p repair. Tricuspid valve regurgitation is mild-moderate by color flow Doppler. The jet is  directed centrally. The tricuspid valve is mildly thickened. The tricuspid valve is status post repair with an annuloplasty ring. Aortic Valve: The aortic valve is tricuspid. Aortic valve regurgitation was not visualized by color flow Doppler. There is no evidence of aortic valve stenosis. There is no evidence of a vegetation on the aortic valve. Pulmonic Valve: The pulmonic valve was normal in structure, with normal. No evidence of pulmonic stenosis. Pulmonic valve regurgitation is trivial by color flow Doppler. Aorta: The aortic root, ascending aorta and aortic arch are normal in size and structure. Pulmonary Artery:The pulmonary artery is moderately dilated. Venous: The inferior vena cava was not well visualized. +--------------+-------++ LEFT VENTRICLE        +--------------+-------++ PLAX 2D               +--------------+-------++ LVIDd:        3.73 cm +--------------+-------++ LVIDs:        3.03 cm +--------------+-------++ LV SV:        23 ml   +--------------+-------++ LV SV Index:  10.63   +--------------+-------++                       +--------------+-------++  Annye Asa MD Electronically signed by Annye Asa MD Signature Date/Time: 10/15/2019/7:05:42 PM    Final    ECHO INTRAOPERATIVE TEE  Result Date: 10/20/2019  *INTRAOPERATIVE TRANSESOPHAGEAL REPORT *  Patient Name:   [name] Date of Exam: 11/11/2019 Medical Rec #:  [id number]          Height:       67.0 in Accession #:    [id number]         Weight:       156.5 lb Date of Birth:  September 23, 1970           BSA:          1.82 m Patient Age:    64 years           BP:           118/94 mmHg Patient Gender: F                  HR:           80 bpm. Exam Location:  Inpatient Transesophogeal exam was performed intraoperatively during mediastinal and thoracic exploration for post-op bleeding. The patient was on ecmo support during the entire surgical procedure and TEE exam. The patient was closely monitored under  general anesthesia for the entirety of the examination. The TEE exam was limited and imaging conditions were suboptimal. Indications:     Tricuspid valve insufficiency, unspecified etiology [I07.1]  Performing Phys: [place] Diagnosing Phys: Roberts Gaudy MD PRE-OP FINDINGS  Left Ventricle: The LV cavity was underfilled. Following removal of the thrombus and blood within the pericardial space, there was improved LV filling and the LV systolic function appeared grossly normal. Right Ventricle: The RV cavity was enlarged. There was reduced RV systolic function which was more pronounced in the basilar region. Right Atrium: The right atrium was enalrged. There was an ecmo cannula visible within the right atrium. Interatrial Septum: The interatrial septum was bowed from right to left consistent with elevated right atrial pressures. No shunt across the interatrial septum could be seen on color Doppler. Pericardium: There was a large amount of thrombus and blood within the pericardial space. This measured 2.71 cm posteriorly and 1.6 cm laterally. At the completion of the procedure, there was no fluid or thrombus noted within the pericardial space. Tricuspid Valve: There was moderate tricuspid insufficiency. The ecmo inflow cannula was oriented above the tricuspid annulus. Aortic Valve: The aortic valve leaflets opened normally and there was mild aortic insufficincy seen on color Doppler.  Roberts Gaudy MD Electronically signed by Roberts Gaudy MD Signature Date/Time: 10/18/2019/12:55:56 PM   Final     Assessment: 49 y.o. 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 4/10 neurology consulted for seizures. Patient is currently on ECMO and twitching activity was noted. On exam today patient is vented, cough, gag noted with suctioning, PERRL, no  verbalizations or spontaneous movements noted. Right facial twitching noted. EEG  Has been ordered and CTH.    Recommendations: - EEG -[facility] ( if stable to travel)  Laurey Morale, MSN, NP-C [facility] [phone]  Attending neurologist's note to follow   10/24/2019, 9:14 AM  NEUROHOSPITALIST ADDENDUM Performed a face to face diagnostic evaluation.   I have reviewed the contents of history and physical exam as documented by PA/ARNP/Resident and agree with above documentation.  I have discussed and formulated the above plan as documented. Edits to the note have been made as needed.  49 year old female with SLE, tricuspid valve repair followed by hemorrhagic shock, currently on ECMO. Patient has been unresponsive since yesterday, having twitching movements.  Intubated and comatose on examination-neurology consulted today morning for evaluation of seizure activity.  Examined the patient-intact brainstem reflexes-pupils 5 mm bilaterally equal and reactive, minimal doll's corneals present and cough reflex present.  No withdrawal to noxious stimulus, no spontaneous movements.  Plantars mute.  2+ reflexes in upper extremities biceps bilaterally and 1+ bilateral patellar reflexes.  Occasional right facial twitching noted.  Recommended emergent CT head: Showed no acute findings.  No hemorrhage/large stroke/significant cerebral edema.  Stat EEG was performed around 10 AM-patient was in status epilepticus.  Was loaded with Keppra 2 g and started on 500 twice daily renal dosing, continue to have frequent epileptiform discharges and Vimpat was added.  Patient no longer in status.   Addendum -Received message from epileptologist Dr. Hortense Ramal around 7 PM that epileptiform discharges are becoming more frequent, patient was on 10 mcg/kg/hr of propofol-advised nurse to gradually increase to rate _0  over the next hour.  Signed out to Dr. Amie [place], nighttime neuro hospitalist to review EEG  periodically and adjust sedation as needed.  We are on my patients blood pressure is tenuous and sedation will be adjusted accordingly.   CRITICAL CARE Performed by: Lanice Schwab    Total  critical care time: 45 minutes  Critical care time was exclusive of separately billable procedures and treating other patients.  Critical care was necessary to treat or prevent imminent or life-threatening deterioration.  Critical care was time spent personally by me on the following activities: development of treatment plan with patient and/or surrogate as well as nursing, discussions with consultants, evaluation of patient's response to treatment, examination of patient, obtaining history from patient or surrogate, ordering and performing treatments and interventions, ordering and review of laboratory studies, ordering and review of radiographic studies, pulse oximetry and re-evaluation of patient's condition.    Karena Addison Thandiwe Siragusa MD [facility] [phone]   If 7pm to 7am, please call on call as listed on AMION.

## 2019-10-24 NOTE — Progress Notes (Signed)
Nutrition Follow Up  DOCUMENTATION CODES:   Not applicable  INTERVENTION:   Tube Feeding:  -Pivot 1.5 at 20 ml/hr via OGT -Increase per toleration to goal rate of 65 ml/hr (1560 ml) -Free water flushes 200 ml Q4 hours- per CCM  At goal TF provides 146 g of protein, 2340 kcals and 1003 mL of free water (2203 ml with flushes).    NUTRITION DIAGNOSIS:   Inadequate oral intake related to inability to eat, acute illness as evidenced by NPO status.  Ongoing  GOAL:   Patient will meet greater than or equal to 90% of their needs   Addressed via TF  MONITOR:   Vent status, Skin, TF tolerance, Weight trends, Labs, I & O's  REASON FOR ASSESSMENT:   Consult, Ventilator Assessment of nutrition requirement/status  ASSESSMENT:   49 yo female admitted with severe TR with TV repair on 4/7, pt with significant postop bleeding requiring return to OR for median sternotomy and reexploration and initiation of VA ECMO. 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  Remains on VA ECMO. Hemodynamics improving, remains on milrinone. UOP better with lasix but remains volume overloaded. Hypernatremia slowly improving with free water flushes. Plan for possible chest closure and ECMO decannulation Monday. Okay to start feeding per CCM.   Admission weight: 71 kg  Current weight: 96.3 kg   Patient is currently intubated on ventilator support MV: 4.4 L/min Temp (24hrs), Avg:98 F (36.7 C), Min:97.5 F (36.4 C), Max:99 F (37.2 C)   I/O: +20,792 ml since admit  UOP: 2,195 ml x 24 hrs  Wound VAC: 300 ml x 24 hrs   Chest tubes: 1,070 ml x 24 hrs   Drips: albumin, precedex, D10 @ 40 ml/hr, lasix, milrinone  Medications: colace, SS novolog Labs: Ma 149 (H) Phosphorus 6.6 (H) LFT elevated Cr 2.49-trending up BUN 31-trending up  Diet Order:   Diet Order            Diet NPO time specified  Diet effective now              EDUCATION NEEDS:   Not appropriate for education at this time  Skin:  Skin Assessment: Skin Integrity Issues: Skin Integrity Issues:: Wound VAC, Incisions Wound Vac: Chest Incisions: multple chest, R axilla, abdomen  Last BM:  4/9  Height:   Ht Readings from Last 1 Encounters:  11/03/2019 5\' 7"  (1.702 m)    Weight:   Wt Readings from Last 1 Encounters:  10/24/19 96.3 kg    BMI:  Body mass index is 33.25 kg/m.  Estimated Nutritional Needs:   Kcal:  1920-2130 kcals  Protein:  107-142 g  Fluid:  1.7 L   Mariana Single RD, LDN Clinical Nutrition Pager listed in [place]

## 2019-10-24 NOTE — Progress Notes (Addendum)
Patient ID: [name], female   DOB: 1971/07/02, 49 y.o.   MRN: [id number]     Advanced Heart Failure Rounding Note  PCP-Cardiologist: No primary care provider on file.   Subjective:      Events: - 4/7 Underwent mini TV repair c/b diffuse coagulopathy and severe bleeding.  Received RBCS, PLTs, TXA, cryo, Factor 7, steroids - 4/7 take back to OR for bleeding and [facility] ECMO placement  - 4/8 Repeat take back to OR for washout - 4/9 Back to OR for right chest hematoma removal. TEE in OR showed EF 40-45%, mild-moderately decreased RV systolic function, s/p TV repair with mild TR.   Remains on ECMO and vent. On norepinephrine 1, milrinone 0.5, NO 23 ppm. Lactate 1.5.  Doing well hemodynamically after OR on 4/9.   Hgb 10.1 this am. PLTs 68k.    Sedated on Precedex. Noted to have jerking, possible myoclonus.   Vent at 40% PEEP 5. ABG 7.47/40.5/363  2000 cc UOP overnight with Lasix gtt 10 mg/hr, CVP 10-12.   CXR with right airspace disease.   ECMO parameters: 3300 rpm Flow 3.4 L/min Sweep 3 Neg pressure -29 Delta P 17  Objective:   Weight Range: 96.3 kg Body mass index is 33.25 kg/m.   Vital Signs:   Temp:  [97.5 F (36.4 C)-99 F (37.2 C)] 97.5 F (36.4 C) (04/10 0700) Resp:  [0] 0 (04/10 0700) SpO2:  [89 %-100 %] 100 % (04/10 0700) Arterial Line BP: (72-115)/(58-93) 92/69 (04/10 0700) FiO2 (%):  [40 %] 40 % (04/10 0310) Weight:  [96.3 kg-98.1 kg] 96.3 kg (04/10 0500) Last BM Date: (UTA)  Weight change: Filed Weights   10/19/2019 0615 10/19/2019 1800 10/24/19 0500  Weight: 99 kg 98.1 kg 96.3 kg    Intake/Output:   Intake/Output Summary (Last 24 hours) at 10/24/2019 7829 Last data filed at 10/24/2019 0700 Gross per 24 hour  Intake 5615.64 ml  Output 3615 ml  Net 2000.64 ml      Physical Exam    General: On vent, sedated Neck: JVP 10 cm  Lungs: Decreased bilaterally CV: Chest open with ECMO cannulas to RA and ascending aorta. Abdomen: Soft, nontender,  no hepatosplenomegaly, no distention.  Skin: Intact without lesions or rashes.  Neurologic: Sedated.  Extremities: No clubbing or cyanosis.  HEENT: Normal.    Telemetry   NSR 70s, personally reviewed   Labs    CBC Recent Labs    10/16/2019 1749 10/28/2019 2028 11/09/2019 1752 10/18/2019 1803 10/24/19 0356 10/24/19 0407  WBC 3.0*   < > 3.3*  --  5.7  --   NEUTROABS 2.5  --   --   --   --   --   HGB 7.7*   < > 11.0*   < > 10.1* 8.2*  HCT 23.3*   < > 31.3*   < > 29.3* 24.0*  MCV 86.6   < > 86.2  --  86.7  --   PLT 77*   < > 86*  --  68*  --    < > = values in this interval not displayed.   Basic Metabolic Panel Recent Labs    11/09/2019 1752 10/31/2019 1803 10/24/19 0356 10/24/19 0407  NA 151*   < > 150* 149*  K 3.9   < > 4.0 3.9  CL 117*  --  115*  --   CO2 25  --  25  --   GLUCOSE 68*  --  86  --  BUN 28*  --  31*  --   CREATININE 2.25*  --  2.49*  --   CALCIUM 7.3*  --  7.7*  --   MG 2.2  --  2.1  --   PHOS  --   --  6.6*  --    < > = values in this interval not displayed.   Liver Function Tests Recent Labs    11/05/2019 0512 10/24/19 0356  AST 242* 169*  ALT 116* 109*  ALKPHOS 25* 37*  BILITOT 1.3* 1.9*  PROT 3.4* 3.5*  ALBUMIN 2.4* 2.2*   No results for input(s): LIPASE, AMYLASE in the last 72 hours. Cardiac Enzymes No results for input(s): CKTOTAL, CKMB, CKMBINDEX, TROPONINI in the last 72 hours.  BNP: BNP (last 3 results) Recent Labs    04/15/19 1101  BNP 248.7*    ProBNP (last 3 results) No results for input(s): PROBNP in the last 8760 hours.   D-Dimer No results for input(s): DDIMER in the last 72 hours. Hemoglobin A1C No results for input(s): HGBA1C in the last 72 hours. Fasting Lipid Panel No results for input(s): CHOL, HDL, LDLCALC, TRIG, CHOLHDL, LDLDIRECT in the last 72 hours. Thyroid Function Tests No results for input(s): TSH, T4TOTAL, T3FREE, THYROIDAB in the last 72 hours.  Invalid input(s): FREET3  Other results:   Imaging     DG CHEST PORT 1 VIEW  Result Date: 10/17/2019 CLINICAL DATA:  ECMO EXAM: PORTABLE CHEST 1 VIEW COMPARISON:  11/12/2019 FINDINGS: Support devices and tubing are unchanged. Diffuse airspace disease throughout the right lung stable. No confluent opacity on the left. No pneumothorax. IMPRESSION: Stable support devices.  Stable right lung airspace disease. Electronically Signed   By: Rolm Baptise M.D.   On: 11/02/2019 18:38   ECHO INTRAOPERATIVE TEE  Result Date: 10/18/2019  *INTRAOPERATIVE TRANSESOPHAGEAL REPORT *  Patient Name:   [name] Date of Exam: 11/01/2019 Medical Rec #:  [id number]          Height:       67.0 in Accession #:    [id number]         Weight:       218.3 lb Date of Birth:  02/15/1971           BSA:          2.10 m Patient Age:    73 years           BP:           83/71 mmHg Patient Gender: F                  HR:           89 bpm. Exam Location:  Inpatient Transesophogeal exam was perform intraoperatively during surgical procedure. Patient was closely monitored under general anesthesia during the entirety of examination. Indications:     limited intra-op TEE for ECMO assessment, possible removal of                  ECMO Performing Phys: [place] TRIGT Diagnosing Phys: Annye Asa MD Complications: No known complications during this procedure. PRE-OP FINDINGS  Left Ventricle: The left ventricle has mild-moderately reduced systolic function, with an ejection fraction of approximately 40-45%, calculated 40%. The cavity size was mildly dilated. There is no increase in left ventricular wall thickness. There is the interventricular septum is flattened in diastole ('D' shaped left ventricle), consistent with right ventricular volume overload. Left ventricular diffuse mild hypokinesis. Right  Ventricle: The right ventricle has moderately reduced systolic function. The cavity was normal. There is no increase in right ventricular wall thickness. Right ventricular systolic pressure  could not be assessed. Left Atrium: Left atrial size was normal in size. The left atrial appendage is well visualized and there is no evidence of thrombus present. Left atrial appendage velocity is normal at greater than 40 cm/s. Right Atrium: Right atrial size is dilated. The large ECMO venous cannula is present in the RA. Interatrial Septum: No atrial level shunt detected by color flow Doppler. Pericardium: There is no evidence of pericardial effusion. Mitral Valve: The mitral valve is normal in structure. No thickening of the mitral valve leaflet. No calcification of the mitral valve leaflet. Mitral valve regurgitation is trivial by color flow Doppler. There is no evidence of mitral valve vegetation. Tricuspid Valve: The tricuspid valve was s/p repair. Tricuspid valve regurgitation is mild-moderate by color flow Doppler. The jet is directed centrally. The tricuspid valve is mildly thickened. The tricuspid valve is status post repair with an annuloplasty ring. Aortic Valve: The aortic valve is tricuspid. Aortic valve regurgitation was not visualized by color flow Doppler. There is no evidence of aortic valve stenosis. There is no evidence of a vegetation on the aortic valve. Pulmonic Valve: The pulmonic valve was normal in structure, with normal. No evidence of pulmonic stenosis. Pulmonic valve regurgitation is trivial by color flow Doppler. Aorta: The aortic root, ascending aorta and aortic arch are normal in size and structure. Pulmonary Artery:The pulmonary artery is moderately dilated. Venous: The inferior vena cava was not well visualized. +--------------+-------++ LEFT VENTRICLE        +--------------+-------++ PLAX 2D               +--------------+-------++ LVIDd:        3.73 cm +--------------+-------++ LVIDs:        3.03 cm +--------------+-------++ LV SV:        23 ml   +--------------+-------++ LV SV Index:  10.63   +--------------+-------++                        +--------------+-------++  Annye Asa MD Electronically signed by Annye Asa MD Signature Date/Time: 11/09/2019/7:05:42 PM    Final      Medications:     Scheduled Medications: . sodium chloride   Intravenous Once  . sodium chloride   Intravenous Once  . sodium chloride   Intravenous Once  . sodium chloride   Intravenous Once  . chlorhexidine gluconate (MEDLINE KIT)  15 mL Mouth Rinse BID  . Chlorhexidine Gluconate Cloth  6 each Topical Daily  . docusate sodium  200 mg Oral Daily  . heparin      . insulin aspart  0-24 Units Subcutaneous Q4H  . levothyroxine  50 mcg Oral Q0600  . mouth rinse  15 mL Mouth Rinse 10 times per day  . pantoprazole (PROTONIX) IV  40 mg Intravenous QHS  . sodium chloride flush  10-40 mL Intracatheter Q12H  . sodium chloride flush  3 mL Intravenous Q12H    Infusions: . sodium chloride Stopped (10/31/2019 1900)  . sodium chloride 10 mL/hr at 10/24/19 0000  . sodium chloride 20 mL/hr at 10/24/19 0400  . albumin human Stopped (10/24/19 0421)  . albumin human Stopped (10/25/2019 1900)  . dexmedetomidine (PRECEDEX) IV infusion 0.7 mcg/kg/hr (10/24/19 0700)  . dextrose 40 mL/hr at 10/24/19 0700  . dextrose 5 % and 0.45% NaCl Stopped (10/27/2019 1900)  .  epinephrine Stopped (10/29/2019 0505)  . furosemide (LASIX) infusion 10 mg/hr (10/24/19 0700)  . heparin    . insulin Stopped (10/24/2019 1458)  . lactated ringers    . lactated ringers    . lactated ringers 100 mL/hr at 11/07/2019 0558  . meropenem (MERREM) IV Stopped (10/16/2019 2148)  . milrinone 0.5 mcg/kg/min (10/24/19 0700)  . niCARDipine Stopped (10/24/19 0552)  . nitroGLYCERIN    . norepinephrine (LEVOPHED) Adult infusion 1 mcg/min (10/24/19 0700)  . phenylephrine (NEO-SYNEPHRINE) Adult infusion Stopped (10/15/2019 2236)  . vancomycin    . vancomycin Stopped (10/16/2019 1900)    PRN Medications: sodium chloride, sodium chloride, albumin human, artificial tears, dextrose, lactated ringers,  metoprolol tartrate, midazolam, morphine injection, ondansetron (ZOFRAN) IV, oxyCODONE, sodium chloride flush, sodium chloride flush, traMADol     Assessment/Plan   1. Post-cardiotomy hemorrhagic shock - Patient with refractory post-operative chest bleeding s/p minimally invasive TV repair on 10/28/2019 in setting of longstanding SLE - has received  RBCS, PLTs, TXA, cryo, Factor 7, steroids - now on ECMO support - Repeat OR visit for right chest clot removal on 4/9 with improved hemodynamics, TEE with EF 40-45%, mild-moderately decreased RV function, stable replaced TV.  - Bleeding has slowed - Continue to replace products as needed - Start low dose heparin gtt today for ECMO circuit, discussed with pharmacy and Dr. Orvan Seen.  - Aim for diuresis over the weekend (weight markedly up), will continue Lasix 10 mg/hr.  Hopefully back to OR Monday to close chest and decannulate ECMO circuit.   2. 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  - plan as above  3. Post-operative respiratory failure - remains intubated, FiO2 0.4.   - Progressive airspace disease in R lung after mini thoracotomy, back to OR 4/9 for right chest clot removal.  - CCM following vent.  4. Acute blood loss anemia - as above - continue to replace as needed  5. Thrombocytopenia - Due to shock/coaguloapthy - now stabilizing, plts 68 K today.   6. Facial twitching/myoclonus - Neuro consulted today.   7. SLE - no change  8. Acute on chronic systolic CHF with RV failure - Marked total body fluid overload.  Lasix gtt 10 mg/hr ongoing.   9. AKI - Creatinine 2.25 => 2.49, follow closely.   CRITICAL CARE Performed by: Loralie Champagne  Total critical care time: 45 minutes  Critical care time was exclusive of separately billable procedures and treating other patients.  Critical care was necessary to treat or prevent imminent or life-threatening deterioration.  Critical care was time  spent personally by me (independent of midlevel providers or residents) on the following activities: development of treatment plan with patient and/or surrogate as well as nursing, discussions with consultants, evaluation of patient's response to treatment, examination of patient, obtaining history from patient or surrogate, ordering and performing treatments and interventions, ordering and review of laboratory studies, ordering and review of radiographic studies, pulse oximetry and re-evaluation of patient's condition.    Length of Stay: 3  Loralie Champagne, MD  10/24/2019, 7:22 AM  Advanced Heart Failure Team Pager [phone] (M-F; 7a - 4p)  Please contact [facility] for night-coverage after hours (4p -7a ) and weekends on [email or web address]

## 2019-10-24 NOTE — Progress Notes (Signed)
1 Day Post-Op Procedure(s) (LRB): Mediastinal washout and Re-exploration of right chest for evacuation of hemothorax (N/A) Application Of Wound Vac using Abthera dressing. (N/A) Subjective: Intubated and sedated  Objective: Vital signs in last 24 hours: Temp:  [97.5 F (36.4 C)-99 F (37.2 C)] 97.5 F (36.4 C) (04/10 0715) Cardiac Rhythm: Normal sinus rhythm (04/10 0415) Resp:  [0] 0 (04/10 0715) SpO2:  [89 %-100 %] 100 % (04/10 0715) Arterial Line BP: (72-115)/(58-93) 92/70 (04/10 0715) FiO2 (%):  [40 %] 40 % (04/10 0310) Weight:  [96.3 kg-98.1 kg] 96.3 kg (04/10 0500)  Hemodynamic parameters for last 24 hours: CVP:  [7 mmHg-12 mmHg] 9 mmHg  Intake/Output from previous day: 04/09 0701 - 04/10 0700 In: 5615.6 [I.V.:2809.1; Blood:2119; NG/GT:120; IV NOMVEHMCN:470.9] Out: 6283 [MOQHU:7654; Emesis/NG output:50; Drains:300; Chest Tube:1070] Intake/Output this shift: No intake/output data recorded.  General appearance: sedated Neurologic: unable to assess Heart: regular rate and rhythm, S1, S2 normal, no murmur, click, rub or gallop Lungs: diminished breath sounds RML Abdomen: soft, non-tender; bowel sounds normal; no masses,  no organomegaly Extremities: edema 2+ Wound: c/d/i dressing, no hematoma  Lab Results: Recent Labs    10/16/2019 1752 11/02/2019 1803 10/24/19 0356 10/24/19 0407  WBC 3.3*  --  5.7  --   HGB 11.0*   < > 10.1* 8.2*  HCT 31.3*   < > 29.3* 24.0*  PLT 86*  --  68*  --    < > = values in this interval not displayed.   BMET:  Recent Labs    11/06/2019 1752 10/15/2019 1803 10/24/19 0356 10/24/19 0407  NA 151*   < > 150* 149*  K 3.9   < > 4.0 3.9  CL 117*  --  115*  --   CO2 25  --  25  --   GLUCOSE 68*  --  86  --   BUN 28*  --  31*  --   CREATININE 2.25*  --  2.49*  --   CALCIUM 7.3*  --  7.7*  --    < > = values in this interval not displayed.    PT/INR:  Recent Labs    10/24/19 0356  LABPROT 16.0*  INR 1.3*   ABG    Component Value  Date/Time   PHART 7.475 (H) 10/24/2019 0407   HCO3 29.9 (H) 10/24/2019 0407   TCO2 31 10/24/2019 0407   ACIDBASEDEF 1.0 10/17/2019 1708   O2SAT 100.0 10/24/2019 0407   CBG (last 3)  Recent Labs    10/17/2019 2005 11/09/2019 2108 10/19/2019 2347  GLUCAP 83 92 92    Assessment/Plan: S/P Procedure(s) (LRB): Mediastinal washout and Re-exploration of right chest for evacuation of hemothorax (N/A) Application Of Wound Vac using Abthera dressing. (N/A) Diuresis low dose heparin with increased LDH and fibrin strands in oxygenator.  Watch carefully for bleeding   LOS: 3 days    Wonda Olds 10/24/2019

## 2019-10-24 NOTE — Progress Notes (Signed)
Following for Palliative.   Full note to follow.  Florentina Jenny, PA-C Palliative Medicine Office:  [phone]

## 2019-10-24 NOTE — Progress Notes (Signed)
EEG complete - results pending 

## 2019-10-25 ENCOUNTER — Inpatient Hospital Stay (HOSPITAL_COMMUNITY): Payer: BC Managed Care – PPO

## 2019-10-25 DIAGNOSIS — R092 Respiratory arrest: Secondary | ICD-10-CM

## 2019-10-25 DIAGNOSIS — G40901 Epilepsy, unspecified, not intractable, with status epilepticus: Secondary | ICD-10-CM | POA: Diagnosis not present

## 2019-10-25 DIAGNOSIS — Z515 Encounter for palliative care: Secondary | ICD-10-CM | POA: Diagnosis not present

## 2019-10-25 LAB — PREPARE FRESH FROZEN PLASMA
Unit division: 0
Unit division: 0
Unit division: 0
Unit division: 0
Unit division: 0

## 2019-10-25 LAB — HEPATIC FUNCTION PANEL
ALT: 76 U/L — ABNORMAL HIGH (ref 0–44)
AST: 90 U/L — ABNORMAL HIGH (ref 15–41)
Albumin: 2.4 g/dL — ABNORMAL LOW (ref 3.5–5.0)
Alkaline Phosphatase: 58 U/L (ref 38–126)
Bilirubin, Direct: 0.5 mg/dL — ABNORMAL HIGH (ref 0.0–0.2)
Indirect Bilirubin: 1.7 mg/dL — ABNORMAL HIGH (ref 0.3–0.9)
Total Bilirubin: 2.2 mg/dL — ABNORMAL HIGH (ref 0.3–1.2)
Total Protein: 4.3 g/dL — ABNORMAL LOW (ref 6.5–8.1)

## 2019-10-25 LAB — BASIC METABOLIC PANEL
Anion gap: 10 (ref 5–15)
Anion gap: 10 (ref 5–15)
BUN: 34 mg/dL — ABNORMAL HIGH (ref 6–20)
BUN: 35 mg/dL — ABNORMAL HIGH (ref 6–20)
CO2: 26 mmol/L (ref 22–32)
CO2: 28 mmol/L (ref 22–32)
Calcium: 8.5 mg/dL — ABNORMAL LOW (ref 8.9–10.3)
Calcium: 8.6 mg/dL — ABNORMAL LOW (ref 8.9–10.3)
Chloride: 109 mmol/L (ref 98–111)
Chloride: 107 mmol/L (ref 98–111)
Creatinine, Ser: 2.57 mg/dL — ABNORMAL HIGH (ref 0.44–1.00)
Creatinine, Ser: 2.3 mg/dL — ABNORMAL HIGH (ref 0.44–1.00)
GFR calc Af Amer: 25 mL/min — ABNORMAL LOW (ref >60)
GFR calc Af Amer: 29 mL/min — ABNORMAL LOW (ref >60)
GFR calc non Af Amer: 22 mL/min — ABNORMAL LOW (ref >60)
GFR calc non Af Amer: 25 mL/min — ABNORMAL LOW (ref >60)
Glucose, Bld: 87 mg/dL (ref 70–99)
Glucose, Bld: 93 mg/dL (ref 70–99)
Potassium: 3.9 mmol/L (ref 3.5–5.1)
Potassium: 3.7 mmol/L (ref 3.5–5.1)
Sodium: 145 mmol/L (ref 135–145)
Sodium: 145 mmol/L (ref 135–145)

## 2019-10-25 LAB — BPAM FFP
Blood Product Expiration Date: 202104132359
Blood Product Expiration Date: 202104132359
Blood Product Expiration Date: 202104132359
Blood Product Expiration Date: 202104132359
Blood Product Expiration Date: 202104132359
Blood Product Expiration Date: 202104132359
Blood Product Expiration Date: 202104132359
ISSUE DATE / TIME: 202104081253
ISSUE DATE / TIME: 202104081253
ISSUE DATE / TIME: 202104081339
ISSUE DATE / TIME: 202104101351
ISSUE DATE / TIME: 202104101351
ISSUE DATE / TIME: 202104101351
ISSUE DATE / TIME: 202104101351
Unit Type and Rh: 6200
Unit Type and Rh: 6200
Unit Type and Rh: 6200
Unit Type and Rh: 6200
Unit Type and Rh: 6200
Unit Type and Rh: 6200
Unit Type and Rh: 6200

## 2019-10-25 LAB — POCT I-STAT 7, (LYTES, BLD GAS, ICA,H+H)
Acid-Base Excess: 5 mmol/L — ABNORMAL HIGH (ref 0.0–2.0)
Acid-Base Excess: 6 mmol/L — ABNORMAL HIGH (ref 0.0–2.0)
Acid-Base Excess: 7 mmol/L — ABNORMAL HIGH (ref 0.0–2.0)
Acid-Base Excess: 9 mmol/L — ABNORMAL HIGH (ref 0.0–2.0)
Bicarbonate: 28.8 mmol/L — ABNORMAL HIGH (ref 20.0–28.0)
Bicarbonate: 30.5 mmol/L — ABNORMAL HIGH (ref 20.0–28.0)
Bicarbonate: 32.7 mmol/L — ABNORMAL HIGH (ref 20.0–28.0)
Bicarbonate: 33.5 mmol/L — ABNORMAL HIGH (ref 20.0–28.0)
Calcium, Ion: 1.23 mmol/L (ref 1.15–1.40)
Calcium, Ion: 1.26 mmol/L (ref 1.15–1.40)
Calcium, Ion: 1.24 mmol/L (ref 1.15–1.40)
Calcium, Ion: 1.22 mmol/L (ref 1.15–1.40)
HCT: 22 % — ABNORMAL LOW (ref 36.0–46.0)
HCT: 22 % — ABNORMAL LOW (ref 36.0–46.0)
HCT: 26 % — ABNORMAL LOW (ref 36.0–46.0)
HCT: 24 % — ABNORMAL LOW (ref 36.0–46.0)
Hemoglobin: 7.5 g/dL — ABNORMAL LOW (ref 12.0–15.0)
Hemoglobin: 7.5 g/dL — ABNORMAL LOW (ref 12.0–15.0)
Hemoglobin: 8.8 g/dL — ABNORMAL LOW (ref 12.0–15.0)
Hemoglobin: 8.2 g/dL — ABNORMAL LOW (ref 12.0–15.0)
O2 Saturation: 100 %
O2 Saturation: 100 %
O2 Saturation: 100 %
O2 Saturation: 100 %
Patient temperature: 36.7
Patient temperature: 36.7
Patient temperature: 36.7
Potassium: 3.9 mmol/L (ref 3.5–5.1)
Potassium: 3.7 mmol/L (ref 3.5–5.1)
Potassium: 3.6 mmol/L (ref 3.5–5.1)
Potassium: 3.9 mmol/L (ref 3.5–5.1)
Sodium: 145 mmol/L (ref 135–145)
Sodium: 144 mmol/L (ref 135–145)
Sodium: 143 mmol/L (ref 135–145)
Sodium: 142 mmol/L (ref 135–145)
TCO2: 30 mmol/L (ref 22–32)
TCO2: 32 mmol/L (ref 22–32)
TCO2: 34 mmol/L — ABNORMAL HIGH (ref 22–32)
TCO2: 35 mmol/L — ABNORMAL HIGH (ref 22–32)
pCO2 arterial: 38.4 mmHg (ref 32.0–48.0)
pCO2 arterial: 45.5 mmHg (ref 32.0–48.0)
pCO2 arterial: 49.7 mmHg — ABNORMAL HIGH (ref 32.0–48.0)
pCO2 arterial: 47.6 mmHg (ref 32.0–48.0)
pH, Arterial: 7.482 — ABNORMAL HIGH (ref 7.350–7.450)
pH, Arterial: 7.433 (ref 7.350–7.450)
pH, Arterial: 7.426 (ref 7.350–7.450)
pH, Arterial: 7.454 — ABNORMAL HIGH (ref 7.350–7.450)
pO2, Arterial: 368 mmHg — ABNORMAL HIGH (ref 83.0–108.0)
pO2, Arterial: 341 mmHg — ABNORMAL HIGH (ref 83.0–108.0)
pO2, Arterial: 359 mmHg — ABNORMAL HIGH (ref 83.0–108.0)
pO2, Arterial: 348 mmHg — ABNORMAL HIGH (ref 83.0–108.0)

## 2019-10-25 LAB — CBC
HCT: 25.6 % — ABNORMAL LOW (ref 36.0–46.0)
HCT: 28.8 % — ABNORMAL LOW (ref 36.0–46.0)
Hemoglobin: 8.7 g/dL — ABNORMAL LOW (ref 12.0–15.0)
Hemoglobin: 9.5 g/dL — ABNORMAL LOW (ref 12.0–15.0)
MCH: 30.2 pg (ref 26.0–34.0)
MCH: 29.4 pg (ref 26.0–34.0)
MCHC: 34 g/dL (ref 30.0–36.0)
MCHC: 33 g/dL (ref 30.0–36.0)
MCV: 88.9 fL (ref 80.0–100.0)
MCV: 89.2 fL (ref 80.0–100.0)
Platelets: 36 10*3/uL — ABNORMAL LOW (ref 150–400)
Platelets: 30 10*3/uL — ABNORMAL LOW (ref 150–400)
RBC: 2.88 MIL/uL — ABNORMAL LOW (ref 3.87–5.11)
RBC: 3.23 MIL/uL — ABNORMAL LOW (ref 3.87–5.11)
RDW: 16.5 % — ABNORMAL HIGH (ref 11.5–15.5)
RDW: 15.9 % — ABNORMAL HIGH (ref 11.5–15.5)
WBC: 4.2 10*3/uL (ref 4.0–10.5)
WBC: 3.9 10*3/uL — ABNORMAL LOW (ref 4.0–10.5)
nRBC: 1.4 % — ABNORMAL HIGH (ref 0.0–0.2)
nRBC: 0.8 % — ABNORMAL HIGH (ref 0.0–0.2)

## 2019-10-25 LAB — APTT
aPTT: 66 seconds — ABNORMAL HIGH (ref 24–36)
aPTT: 58 seconds — ABNORMAL HIGH (ref 24–36)
aPTT: 65 seconds — ABNORMAL HIGH (ref 24–36)
aPTT: 60 seconds — ABNORMAL HIGH (ref 24–36)

## 2019-10-25 LAB — HEMOGLOBIN AND HEMATOCRIT, BLOOD
HCT: 28.8 % — ABNORMAL LOW (ref 36.0–46.0)
Hemoglobin: 9.6 g/dL — ABNORMAL LOW (ref 12.0–15.0)

## 2019-10-25 LAB — GLUCOSE, CAPILLARY
Glucose-Capillary: 78 mg/dL (ref 70–99)
Glucose-Capillary: 88 mg/dL (ref 70–99)
Glucose-Capillary: 102 mg/dL — ABNORMAL HIGH (ref 70–99)
Glucose-Capillary: 82 mg/dL (ref 70–99)
Glucose-Capillary: 89 mg/dL (ref 70–99)
Glucose-Capillary: 114 mg/dL — ABNORMAL HIGH (ref 70–99)

## 2019-10-25 LAB — PROTIME-INR
INR: 1.2 (ref 0.8–1.2)
Prothrombin Time: 15.1 seconds (ref 11.4–15.2)

## 2019-10-25 LAB — VANCOMYCIN, TROUGH: Vancomycin Tr: 29 ug/mL (ref 15–20)

## 2019-10-25 LAB — HEPARIN LEVEL (UNFRACTIONATED)
Heparin Unfractionated: <0.1 IU/mL — ABNORMAL LOW (ref 0.30–0.70)
Heparin Unfractionated: <0.1 IU/mL — ABNORMAL LOW (ref 0.30–0.70)

## 2019-10-25 LAB — PROCALCITONIN: Procalcitonin: 4.84 ng/mL

## 2019-10-25 LAB — CORTISOL: Cortisol, Plasma: 7.6 ug/dL

## 2019-10-25 LAB — FIBRINOGEN: Fibrinogen: 484 mg/dL — ABNORMAL HIGH (ref 210–475)

## 2019-10-25 LAB — PREPARE RBC (CROSSMATCH)

## 2019-10-25 LAB — LACTATE DEHYDROGENASE: LDH: 505 U/L — ABNORMAL HIGH (ref 98–192)

## 2019-10-25 MED ORDER — MIDAZOLAM 50MG/50ML (1MG/ML) PREMIX INFUSION
20.0000 mg/h | INTRAVENOUS | Status: DC
  Administered 2019-10-25: 5 mg/h via INTRAVENOUS
  Administered 2019-10-25: 10 mg/h via INTRAVENOUS
  Administered 2019-10-25: 5 mg/h via INTRAVENOUS
  Administered 2019-10-26: 20 mg/h via INTRAVENOUS
  Administered 2019-10-26: 10 mg/h via INTRAVENOUS
  Administered 2019-10-26 – 2019-10-27 (×9): 20 mg/h via INTRAVENOUS
  Filled 2019-10-25 (×4): qty 50
  Filled 2019-10-25: qty 100
  Filled 2019-10-25 (×9): qty 50

## 2019-10-25 MED ORDER — POTASSIUM CHLORIDE 10 MEQ/50ML IV SOLN
10.0000 meq | INTRAVENOUS | Status: AC
  Administered 2019-10-25 (×3): 10 meq via INTRAVENOUS
  Filled 2019-10-25 (×3): qty 50

## 2019-10-25 MED ORDER — PIVOT 1.5 CAL PO LIQD
1000.0000 mL | ORAL | Status: DC
  Administered 2019-10-25: 1000 mL
  Filled 2019-10-25 (×3): qty 1000

## 2019-10-25 MED ORDER — SODIUM CHLORIDE 0.9% IV SOLUTION: Freq: Once | INTRAVENOUS | Status: AC

## 2019-10-25 MED ORDER — VANCOMYCIN HCL 750 MG/150ML IV SOLN
750.0000 mg | INTRAVENOUS | Status: DC
  Administered 2019-10-26: 750 mg via INTRAVENOUS
  Filled 2019-10-25 (×2): qty 150

## 2019-10-25 MED ORDER — LEVETIRACETAM IN NACL 1000 MG/100ML IV SOLN
1000.0000 mg | Freq: Two times a day (BID) | INTRAVENOUS | Status: DC
  Administered 2019-10-25 – 2019-11-10 (×33): 1000 mg via INTRAVENOUS
  Filled 2019-10-25 (×33): qty 100

## 2019-10-25 MED ORDER — CHLORHEXIDINE GLUCONATE 0.12 % MT SOLN
OROMUCOSAL | Status: AC
  Administered 2019-10-25: 15 mL via OROMUCOSAL
  Filled 2019-10-25: qty 15

## 2019-10-25 MED ORDER — SENNA 8.6 MG PO TABS
1.0000 | ORAL_TABLET | Freq: Every day | ORAL | Status: DC
  Administered 2019-10-25: 8.6 mg via ORAL
  Filled 2019-10-25: qty 1

## 2019-10-25 NOTE — Progress Notes (Signed)
NAME:  [name], MRN:  [id number], DOB:  [birth year], LOS: 4 ADMISSION DATE:  10/28/2019, CONSULTATION DATE:  10/25/2019  REFERRING MD:  Rexene Alberts, MD, CHIEF COMPLAINT:  Vent management.  History of present illness   The patient is a 49 year old woman who is postoperative day 3 from minimally invasive tricuspid valve repair which was then converted into an open procedure.  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  However despite echocardiograms showing severely elevated RA pressures, she has had 2 right heart cath that demonstrated normal peripheral vascular resistance for Group 1 pulmonary arterial hypertension.  She was taken to the OR by Dr. Roxy Manns on 4/7 given her persistent symptoms for repair of severe tricuspid regurgitation.  4/8, she had reexploration of her median sternotomy with concern for postoperative bleeding.  There was a large amount of clot evacuated from the anterior mediastinum and significant oozing diffusely.  Does not appear that there were any significant signs for mechanical active bleeding.  Bili at that time she was cannulated for ECMO and started on [place] ECMO on 4/8.  Since then she has had substantial correction of her coagulopathy including Kcentra, FEIBA, cryoprecipitate, FFP and copious blood products given her substantial bleeding in the mediastinum.  She has also been started on inhaled nitric oxide for her component of right ventricular heart failure as well as  PCCM is being consulted to assist with vent management.  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   Consults:  Heart failure, PCCM  Procedures:  4/7 tricuspid valve repair through median sternotomy 4/8 reexploration of the mediastinum with cannulation for ECMO  Significant Diagnostic Tests:  Transesophageal echocardiogram January 2021  1. Left ventricular ejection fraction, by visual estimation, is 60 to  65%. The left ventricle has normal function. There is no left ventricular  hypertrophy.  2. The left ventricle has no regional wall motion abnormalities.  3. Global right ventricle has mildly reduced systolic function.The right  ventricular size is normal. No increase in right ventricular wall  thickness.  4. Left atrial size was moderately dilated.  5. Right atrial size was severely dilated.  6. The mitral valve is normal in structure. Trivial mitral valve  regurgitation.  7. The tricuspid valve is abnormal.  8. The tricuspid valve is abnormal. Tricuspid valve regurgitation is  severe.  9. Apparent mild restriction of septal leaflet. RVSP ~ 30mmHG.  10. The aortic valve is normal in structure. Aortic valve regurgitation is  not visualized.  11. The pulmonic valve was grossly normal. Pulmonic valve regurgitation is  not visualized.  12. Mild plaque invoving the descending aorta.  13. Severely elevated pulmonary artery systolic pressure.   Right heart catheterization June 2020 Findings:  RA = 5 RV = 34/7 PA =  34/6 (20) PCW = 8 Fick cardiac output/index = 5.9/3.1 PVR = 2.0 WU Ao sat = 100% PA sat = 71%, 72% SVC sat = 66%  Assessment: 1. Minimally elevated PA pressures with normal PVR and cardiac output 2. No evidence of significant intracardiac shunt  Micro  Data:  None  Antimicrobials:  Levofloxacin 4/7 >> 4/8 Vancomycin 4/7 >> current Meropenem 4/9  >> current   Subjective:  Remains sedated.  Minimal convulsive activity reported.  ECMO flow rate very dependent on afterload. Flipping between cardene and levo. Now off both with good flow rates.  100cc out of chest. No other clinical evidence of bleeding.  Good UOP.   Objective   Blood pressure 93/63, pulse 68, temperature 98.1 F (36.7 C), resp. rate (!) 0, height 5\' 7"  (1.702 m), weight 96.3 kg, last menstrual period 04/02/2017, SpO2 100 %. CVP:  [7 mmHg-58 mmHg] 18 mmHg  Vent Mode: PRVC FiO2 (%):  [40 %-100 %] 40 % Set Rate:  [12 bmp] 12 bmp Vt Set:  [360 mL-370 mL] 370 mL PEEP:  [5 cmH20] 5 cmH20 Plateau Pressure:  [20 cmH20-23 cmH20] 23 cmH20   Intake/Output Summary (Last 24 hours) at 10/25/2019 0534 Last data filed at 10/25/2019 0500 Gross per 24 hour  Intake 3491.71 ml  Output 8840 ml  Net -5348.29 ml   Filed Weights   10/24/2019 0615 11/06/2019 1800 10/24/19 0500  Weight: 99 kg 98.1 kg 96.3 kg   Examination: GEN: Obese female, sedated  HEENT: ETT in place, minimal secretions CV: Open sternotomy with cannulas in place, ext warm PULM: Diminished bases, passive on vent GI: Soft, hypoactive BS EXT: Global anasarca NEURO: Sedated SKIN/Circuit: fibrin strands at 3/12/6 o clock in oxygenator, minimal color change in tubing due to high SvO2  ABG 7.48/38/368 on sweep 1LPM Net +16.3L since admission, -5.1L over last 24 hours on Furosemide gtt 10mg /hr Renal function stable with improvement in AST/ALT CBGs on lower end CT head negative for acute abnormalities  EEG with evidence of convulsive status epilepticus  Fibrinogen 484 LDH 449>>581>>505 Resolved Hospital Problem list   None  Assessment & Plan:  [name] is a 49 y.o. woman with history of SLE with thrombocytopenia, nephritis, AVN and possible mild chronic RV failure who presented for repair of severe symptomatic tricuspid regurgitation on 4/7.    Acute hypoxemic respiratory failure - Continue vent  support - Maintain pH 7.35-7.4 - Usual VAP prevention measures  Cardiogenic Shock - V/A ecmo since 4/8 - Renal function stable. AST/ALT down trending  - Net +16.3L since admission, -5.1L over last 24 hours on Furosemide gtt 10mg /hr. CVPs elevated this AM but probably not accurate.  - On milrinone 0.5 mcg/hr  - Diuresis, AC, and inotropes per CHF/TCTS teams  SLE with immune mediated thrombocytopenia and coagulopathy - Improved, minimal from chest over the last 24 hours. No other clinical evidence of bleeding.  - CBC this AM  - Monitor with AC resumption  Acute encephalopathy Status Epilepticus  - Not responsive, currently on Precedex 0.8 and Propofol 30  - EEG with evidence of convulsive status epilep[ticus  - CT head negative  - Started on Vimpat and Keppra per neurology.   Best practice:  Diet: Trickle feeds, FWF Pain/Anxiety/Delirium protocol (if indicated): Precedex and propofol VAP protocol (if indicated): Ordered DVT prophylaxis: heparin gtt GI prophylaxis: Pantoprazole Glucose control: Controlled, blood sugars less than 180 Foley required for critically ill state Mobility: Bedrest Code Status: Full code Family Communication: Per primary team Disposition: Needs ICU  Please see attending attestation for final recommendations.   Ina Homes, MD  [facility] PGY3  Pager: [phone]

## 2019-10-25 NOTE — Progress Notes (Signed)
LTM maint complete - no skin breakdown under:  Fp1, F3, F7, M1. Fixed p3, o2

## 2019-10-25 NOTE — Progress Notes (Addendum)
Pharmacy Antibiotic Note  [name] is a 49 y.o. female admitted on 11/04/2019 with TV repair. Postop course has been complicated by hemorrhagic shock and [facility] ECMO was initiated later on 4/7. Back to OR for re-exploration and washout on 4/8 and 4/9. Patient remains with open chest. She received vancomycin and levofloxacin for perioperative antibiotics. Pharmacy has been consulted for vancomycin and meropenem dosing while chest is still open. Plan to return to OR on Monday for closure and ECMO decannulation.   Vancomycin trough collected today is above goal at 29.   Plan: Reduce vancomycin to 750 mg IV every 24 hours.  Goal trough 15-20 mcg/mL.  Meropenem 2 g IV q12h Monitor renal function, surgical plans, length of therapy, and vancomycin levels at new steady state  Height: 5\' 7"  (170.2 cm) Weight: 92.4 kg (203 lb 11.3 oz) IBW/kg (Calculated) : 61.6  Temp (24hrs), Avg:97.9 F (36.6 C), Min:97.3 F (36.3 C), Max:98.1 F (36.7 C)  Recent Labs  Lab 10/30/2019 0956 11/05/2019 1107 11/03/2019 1838 10/20/2019 1843 10/28/2019 0512 11/08/2019 0512 10/26/2019 0735 11/01/2019 1125 11/07/2019 1134 11/01/2019 1752 10/24/19 0356 10/24/19 1708 10/25/19 0510 10/25/19 1108  WBC  --    < >  --    < > 3.6*   < >  --   --  4.3 3.3* 5.7 5.2 4.2  --   CREATININE  --    < >  --    < > 2.04*  --    < > 2.00*  --  2.25* 2.49* 2.55* 2.57*  --   LATICACIDVEN 3.0*  --  2.3*  --  1.6  --   --   --   --   --  1.5  --   --   --   VANCOTROUGH  --   --   --   --   --   --   --   --   --   --   --   --   --  29*   < > = values in this interval not displayed.    Estimated Creatinine Clearance: 31.9 mL/min (A) (by C-G formula based on SCr of 2.57 mg/dL (H)).    Allergies  Allergen Reactions  . Lisinopril     angioedema  . Penicillins Hives    DID THE REACTION INVOLVE: Swelling of the face/tongue/throat, SOB, or low BP? Unknown Sudden or severe rash/hives, skin peeling, or the inside of the mouth or nose?  Unknown Did it require medical treatment? Unknown When did it last happen?age 33 If all above answers are "NO", may proceed with cephalosporin use.     Antimicrobials this admission: Vancomycin 4/7 >> Levofloxacin 4/7 >> 4/8 Meropenem 4/9 >>  Dose adjustments this admission: 4/11 VT 29: reduced to 750 mg IV q24h  Microbiology results: 4/9 sternal wound: ngtd  Thank you for allowing pharmacy to be a part of this patient's care.  Vertis Kelch, PharmD, BCPS PGY2 Cardiology Pharmacy Resident Phone [phone] 10/25/2019       1:33 PM  Please check [email or web address] for unit-specific pharmacist phone numbers

## 2019-10-25 NOTE — Progress Notes (Signed)
Reason for consult: Status epilepticus  Subjective: No acute events overnight.  No longer having right facial twitching   ROS: negative except above   Examination  Vital signs in last 24 hours: Temp:  [96.6 F (35.9 C)-98.1 F (36.7 C)] 98.1 F (36.7 C) (04/11 0745) Pulse Rate:  [68-91] 68 (04/11 0411) Resp:  [0-13] 0 (04/11 0745) BP: (93-101)/(63-76) 93/63 (04/11 0411) SpO2:  [100 %] 100 % (04/11 0745) Arterial Line BP: (83-127)/(60-100) 102/68 (04/11 0745) FiO2 (%):  [40 %-100 %] 40 % (04/11 0411) Weight:  [92.4 kg] 92.4 kg (04/11 0645)  General: lying in bed CVS: pulse-normal rate and rhythm RS: breathing comfortably Extremities: normal   Neuro: Mental Status: Patient does not respond to verbal stimuli.  Does not respond to deep sternal rub.  Does not follow commands.  No verbalizations noted.  Cranial Nerves: II:  pupils are reactive but sluggish, 5 mm bilaterally V,VI: doll's response absent bilaterally. V,VII: corneal reflex: Absent VIII: patient does not respond to verbal stimuli IX,X: gag reflex present XI: trapezius strength unable to test bilaterally XII: tongue strength unable to test Motor: Extremities flaccid throughout.  No spontaneous movement noted.  No purposeful movements noted.  Sensory: Does not respond to noxious stimuli in any extremity. Plantars: Mute Cerebellar: Unable to perform    Basic Metabolic Panel: Recent Labs  Lab 11/11/2019 0334 10/20/2019 0636 10/25/2019 1632 10/28/2019 1633 11/06/2019 0512 10/31/2019 0512 10/27/2019 0524 11/09/2019 1125 10/18/2019 1752 11/03/2019 1803 10/24/19 0356 10/24/19 0407 10/24/19 1708 10/24/19 1716 10/24/19 1829 10/24/19 2232 10/25/19 0510 10/25/19 0519 10/25/19 0657  NA 149*   < > 152*   < > 152*   < >   < > 151* 151*   < > 150*   < > 147*   < > 148* 147* 145 145 144  K 4.5   < > 3.7   < > 4.1   < >   < > 3.6 3.9   < > 4.0   < > 3.8   < > 3.6 3.5 3.9 3.9 3.7  CL 118*   < > 120*   < > 115*  --    < > 113*  117*  --  115*  --  113*  --   --   --  109  --   --   CO2 17*   < > 23   < > 25  --   --   --  25  --  25  --  25  --   --   --  26  --   --   GLUCOSE 168*   < > 126*   < > 74  --    < > 78 68*  --  86  --  66*  --   --   --  87  --   --   BUN 13   < > 15   < > 24*  --    < > 22* 28*  --  31*  --  34*  --   --   --  34*  --   --   CREATININE 1.16*   < > 1.36*   < > 2.04*  --    < > 2.00* 2.25*  --  2.49*  --  2.55*  --   --   --  2.57*  --   --   CALCIUM 5.6*   < > 6.3*   < > 7.4*   < >  --   --  7.3*   < > 7.7*  --  8.2*  --   --   --  8.5*  --   --   MG 1.3*  --  1.6*  --   --   --   --   --  2.2  --  2.1  --   --   --   --   --   --   --   --   PHOS  --   --   --   --   --   --   --   --   --   --  6.6*  --   --   --   --   --   --   --   --    < > = values in this interval not displayed.    CBC: Recent Labs  Lab 10/19/2019 1749 11/03/2019 10-28-26 11/04/2019 1134 11/04/2019 1134 11/07/2019 1752 10/19/2019 1803 10/24/19 0356 10/24/19 0407 10/24/19 1708 10/24/19 1716 10/24/19 1829 10/24/19 2232 10/25/19 0510 10/25/19 0519 10/25/19 0657  WBC 3.0*   < > 4.3  --  3.3*  --  5.7  --  5.2  --   --   --  4.2  --   --   NEUTROABS 2.5  --   --   --   --   --   --   --   --   --   --   --   --   --   --   HGB 7.7*   < > 8.4*   < > 11.0*   < > 10.1*   < > 9.2*   < > 8.2* 7.8* 8.7* 7.5* 7.5*  HCT 23.3*   < > 23.8*   < > 31.3*   < > 29.3*   < > 26.9*   < > 24.0* 23.0* 25.6* 22.0* 22.0*  MCV 86.6   < > 83.2  --  86.2  --  86.7  --  87.1  --   --   --  88.9  --   --   PLT 77*   < > 51*  --  86*  --  68*  --  47*  --   --   --  36*  --   --    < > = values in this interval not displayed.     Coagulation Studies: Recent Labs    10/25/2019 2200-10-27 10/19/2019 0512 11/01/2019 1752 10/24/19 0356 10/25/19 0510  LABPROT 17.7* 17.4* 17.1* 16.0* 15.1  INR 1.5* 1.4* 1.4* 1.3* 1.2    Imaging Reviewed: CT head - no acute abnormality  EEG on 4/10 DESCRIPTION:  At the beginning of study patient was noted to have  right face twitching. Concomitant eeg showed periodic epileptiform discharges at 2.5-3Hz  in left frontocentral region. Continuous generalized 2-3hz  delta slowing was also noted. Hyperventilation and photic stimulation were not performed.   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 As AED were titrated, eeg improved and showed severe diffuse encephalopathy, non specific to etiology. However, since around 7am on 10/25/2019, eeg appears to be worsening again with generalized periodic epileptiform discharges with overriding fast activity at 2-2.5Hz  consistent with ictal-interictal continuum.  ASSESSMENT AND PLAN  49 year old female with SLE, tricuspid valve repair followed by hemorrhagic shock, currently on ECMO. Patient has been unresponsive since 10/31/2019, having twitching movements.  Neurology consulted on 4/10 morning-EEG suggestive of focal convulsive status epilepticus.  Patient placed on continuous EEG monitoring.  Patient loaded with Keppra and then again with Vimpat-status epilepticus resolved, however later in the evening patient began to have periodic epileptiform discharges and was started on propofol.  This morning since 7 AM, generalized epileptiform discharges have gotten worse and are in the ictal interictal continuum suggestive for nonconvulsive status epilepticus.  As patient's status epilepticus was preceded with hemorrhagic shock, likely etiology for seizure versus hypoxic brain injury.  Suspected hypoxic brain injury Convulsive status epilepticus-resolved, now with generalized periodic epileptiform discharges in the  ictal/interictal continuum  Hemorrhagic and cardiogenic shock status post AV replacement Immune mediated thrombocytopenia Acute hypoxic respiratory failure Borderline hypoglycemia SLE  Recommendations -Continue long-term EEG monitoring -We maximize Keppra to 1 g twice daily and Vimpat (renal dosing) -Increase propofol gtt to 40 MCG/kg/hr -Started patient on midozalam gtt at 5 mg/hr, will titrate as needed -Appreciate assistance of CCM for pressors/BP management -MRI brain when patient is stable/EEG leads removed to assist with prognostication -Seizure precautions -Frequent neurochecks -Continue to correct electrolyte metabolic abnormalities -Avoid cefepime as this can reduce seizure threshold     CRITICAL CARE Performed by: Lanice Schwab Aroor   Total critical care time: 45  minutes  Critical care time was exclusive of separately billable procedures and treating other patients.  Critical care was necessary to treat or prevent imminent or life-threatening deterioration.  Critical care was time spent personally by me on the following activities: development of treatment plan with patient and/or surrogate as well as nursing, discussions with consultants, evaluation of patient's response to treatment, examination of patient, obtaining history from patient or surrogate, ordering and performing treatments and interventions, ordering and review of laboratory studies, ordering and review of radiographic studies, pulse oximetry and re-evaluation of patient's condition.  Karena Addison Aroor [facility] Pager Number [phone] For questions after 7pm please refer to AMION to reach the Neurologist on call

## 2019-10-25 NOTE — Progress Notes (Signed)
   10/25/19 1332  Clinical Encounter Type  Visited With Patient and family together;Health care provider  Visit Type Initial;Critical Care;Spiritual support  Referral From Nurse;Family  Spiritual Encounters  Spiritual Needs Emotional;Prayer  Stress Factors  Family Stress Factors Health changes;Lack of knowledge   Chaplain responded to a request to visit with the patient's husband, Herbie Baltimore. Herbie Baltimore shared that this experience has been one of 'peaks and valleys,' and he was having a valley moment when I was asked for.  Herbie Baltimore explained that faith (Christianity) is important to him and his wife. Family was about to meet with Palliative Care, and Herbie Baltimore asked me what I knew about comfort care. Herbie Baltimore also shared they have two young children, and trying to navigate this as a parent has also been really difficult.   Chaplain offered prayer and support.Chaplain introduced spiritual care services. Spiritual care services available as needed.   Jeri Lager, Chaplain

## 2019-10-25 NOTE — Progress Notes (Addendum)
Patient ID: [name], female   DOB: [birth year], 49 y.o.   MRN: [id number]     Advanced Heart Failure Rounding Note  PCP-Cardiologist: No primary care provider on file.   Subjective:    Events: - 4/7 Underwent mini TV repair c/b diffuse coagulopathy and severe bleeding.  Received RBCS, PLTs, TXA, cryo, Factor 7, steroids - 4/7 take back to OR for bleeding and VA ECMO placement  - 4/8 Repeat take back to OR for washout - 4/9 Back to OR for right chest hematoma removal. TEE in OR showed EF 40-45%, mild-moderately decreased RV systolic function, s/p TV repair with mild TR.  - 4/10 EEG with status epilepticus  Remains on ECMO and vent. On milrinone 0.5, NO 23 ppm. Stable hemodynamically.  Very good UOP on Lasix gtt 10 mg/hr, weight down 9 lbs.  Creatinine 2.49 => 2.55.   Low dose heparin started yesterday, ECMO circuit flowing better.   Hgb 8.7 this am. PLTs 68 => 36.  LDH 581 => 505.    Yesterday noted to have constant jerking/twitching. EEG done showing convulsive status epilepticus.  Neuro following, now on Keppra and Vimpat, Propofol increased to 30.  Twitching on exam has resolved. CT head was negative.   Vent at 40% PEEP 5. ABG 7.43/45/341  ECMO parameters: 3300 rpm Flow 3.7 L/min Sweep 1 Neg pressure -25 Delta P 17  Objective:   Weight Range: 92.4 kg Body mass index is 31.9 kg/m.   Vital Signs:   Temp:  [96.6 F (35.9 C)-98.1 F (36.7 C)] 98.1 F (36.7 C) (04/11 0700) Pulse Rate:  [68-91] 68 (04/11 0411) Resp:  [0-13] 0 (04/11 0700) BP: (93-101)/(63-76) 93/63 (04/11 0411) SpO2:  [100 %] 100 % (04/11 0700) Arterial Line BP: (83-127)/(60-100) 89/61 (04/11 0700) FiO2 (%):  [40 %-100 %] 40 % (04/11 0411) Weight:  [92.4 kg] 92.4 kg (04/11 0645) Last BM Date: (UTA)  Weight change: Filed Weights   10/25/2019 1800 10/24/19 0500 10/25/19 0645  Weight: 98.1 kg 96.3 kg 92.4 kg    Intake/Output:   Intake/Output Summary (Last 24 hours) at 10/25/2019 0724 Last  data filed at 10/25/2019 0700 Gross per 24 hour  Intake 3563.96 ml  Output 9180 ml  Net -5616.04 ml      Physical Exam    General: NAD, on vent Neck: JVP elevated, no thyromegaly or thyroid nodule.  Lungs: Decreased at bases.  CV: Chest open.  1+ ankle edema.  Abdomen: Soft, nontender, no hepatosplenomegaly, no distention.  Skin: Intact without lesions or rashes.  Neurologic: Not responsive Extremities: No clubbing or cyanosis.  HEENT: Normal.    Telemetry   NSR 70s, personally reviewed   Labs    CBC Recent Labs    10/24/19 1708 10/24/19 1716 10/25/19 0510 10/25/19 0510 10/25/19 0519 10/25/19 0657  WBC 5.2  --  4.2  --   --   --   HGB 9.2*   < > 8.7*   < > 7.5* 7.5*  HCT 26.9*   < > 25.6*   < > 22.0* 22.0*  MCV 87.1  --  88.9  --   --   --   PLT 47*  --  36*  --   --   --    < > = values in this interval not displayed.   Basic Metabolic Panel Recent Labs    10/20/2019 1752 11/05/2019 1803 10/24/19 0356 10/24/19 0407 10/24/19 1708 10/24/19 1716 10/25/19 0510 10/25/19 0510 10/25/19 0519 10/25/19 0657  NA  151*   < > 150*   < > 147*   < > 145   < > 145 144  K 3.9   < > 4.0   < > 3.8   < > 3.9   < > 3.9 3.7  CL 117*   < > 115*   < > 113*  --  109  --   --   --   CO2 25   < > 25   < > 25  --  26  --   --   --   GLUCOSE 68*   < > 86   < > 66*  --  87  --   --   --   BUN 28*   < > 31*   < > 34*  --  34*  --   --   --   CREATININE 2.25*   < > 2.49*   < > 2.55*  --  2.57*  --   --   --   CALCIUM 7.3*   < > 7.7*   < > 8.2*  --  8.5*  --   --   --   MG 2.2  --  2.1  --   --   --   --   --   --   --   PHOS  --   --  6.6*  --   --   --   --   --   --   --    < > = values in this interval not displayed.   Liver Function Tests Recent Labs    10/24/19 0356 10/25/19 0510  AST 169* 90*  ALT 109* 76*  ALKPHOS 37* 58  BILITOT 1.9* 2.2*  PROT 3.5* 4.3*  ALBUMIN 2.2* 2.4*   No results for input(s): LIPASE, AMYLASE in the last 72 hours. Cardiac Enzymes No results  for input(s): CKTOTAL, CKMB, CKMBINDEX, TROPONINI in the last 72 hours.  BNP: BNP (last 3 results) Recent Labs    04/15/19 1101  BNP 248.7*    ProBNP (last 3 results) No results for input(s): PROBNP in the last 8760 hours.   D-Dimer No results for input(s): DDIMER in the last 72 hours. Hemoglobin A1C No results for input(s): HGBA1C in the last 72 hours. Fasting Lipid Panel Recent Labs    10/24/19 1708  TRIG 31   Thyroid Function Tests No results for input(s): TSH, T4TOTAL, T3FREE, THYROIDAB in the last 72 hours.  Invalid input(s): FREET3  Other results:   Imaging    EEG  Result Date: 10/24/2019 Lora Havens, MD     10/24/2019  1:42 PM Patient Name: [name] MRN: [id number] Epilepsy Attending: Lora Havens Referring Physician/Provider: Dr Karena Addison Aroor Date: 10/24/2019 Duration: 25.31 mins Patient history: 49 y.o. 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 Neurology consulted for right facial twitching noted. EEG to evaluate for seizure Level of alertness: comatose AEDs during EEG study: None Technical aspects: This EEG study was done with scalp electrodes positioned according to the 10-20 International system of electrode placement. Electrical activity was acquired at a sampling rate of _0  and reviewed with a high frequency filter of _1  and a low frequency filter of _2 . EEG data were recorded continuously and digitally stored. DESCRIPTION:  At the beginning of study patient was noted to have right  face twitching. Concomitant eeg showed periodic epileptiform discharges at 2.5-_0  in left frontocentral region. Continuous generalized 2-_1  delta slowing was also noted. Hyperventilation and photic stimulation were not performed. 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 Additionally, there is evidence of severe diffuse encephalopathy, non specific to etiology. Dr Rory Percy was immediately notifed. Lora Havens   CT HEAD WO CONTRAST  Result Date: 10/24/2019 CLINICAL DATA:  Possible seizure EXAM: CT HEAD WITHOUT CONTRAST TECHNIQUE: Contiguous axial images were obtained from the base of the skull through the vertex without intravenous contrast. COMPARISON:  None. FINDINGS: Brain: There is no acute intracranial hemorrhage, mass effect, or edema. Somewhat decreased gray-white differentiation is favored to be on a technical basis noting artifact throughout. There is no extra-axial fluid collection. Ventricles and sulci are within normal limits in size and configuration. 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 Vascular: No hyperdense vessel or unexpected calcification. Skull: Calvarium is unremarkable. Sinuses/Orbits: Nonspecific paranasal sinus and mastoid opacification. Retained secretions in the visualized pharynx. Orbits are unremarkable. Other: Right frontotemporal scalp soft tissue swelling extending into the face. Posterior left scalp soft tissue swelling. IMPRESSION: No acute intracranial abnormality. Electronically Signed   By: Macy Mis M.D.   On: 10/24/2019 10:33     Medications:     Scheduled Medications: . sodium chloride   Intravenous Once  . chlorhexidine gluconate (MEDLINE KIT)  15 mL Mouth Rinse BID  . Chlorhexidine Gluconate Cloth  6 each Topical Daily  . docusate sodium  200 mg Oral Daily  . free water  200 mL Per Tube Q4H  . insulin aspart  0-24 Units Subcutaneous Q4H  . levothyroxine  50 mcg Oral Q0600  . mouth rinse  15 mL Mouth Rinse 10 times per day  . pantoprazole (PROTONIX) IV  40 mg Intravenous QHS  . sodium chloride flush  10-40 mL  Intracatheter Q12H  . sodium chloride flush  3 mL Intravenous Q12H    Infusions: . sodium chloride Stopped (11/08/2019 1900)  . sodium chloride 10 mL/hr at 10/24/19 0000  . sodium chloride 20 mL/hr at 10/24/19 0400  . albumin human Stopped (10/25/19 0409)  . albumin human Stopped (11/05/2019 1900)  . dexmedetomidine (PRECEDEX) IV infusion 0.8 mcg/kg/hr (10/25/19 0700)  . dextrose 40 mL/hr at 10/25/19 0700  . dextrose 5 % and 0.45% NaCl Stopped (10/18/2019 1900)  . epinephrine Stopped (11/06/2019 0505)  . feeding supplement (PIVOT 1.5 CAL) 20 mL/hr at 10/25/19 0700  . furosemide (LASIX) infusion 10 mg/hr (10/25/19 0700)  . heparin 500 Units/hr (10/25/19 0700)  . insulin Stopped (10/31/2019 1458)  . lacosamide (VIMPAT) IV Stopped (10/25/19 0331)  . lactated ringers    . lactated ringers    . lactated ringers 100 mL/hr at 10/17/2019 0558  . levETIRAcetam Stopped (10/25/19 0033)  . meropenem (MERREM) IV Stopped (10/24/19 2141)  . milrinone 0.5 mcg/kg/min (10/25/19 0700)  . niCARDipine Stopped (10/25/19 0324)  . nitroGLYCERIN    . norepinephrine (LEVOPHED) Adult infusion Stopped (10/25/19 0316)  . phenylephrine (NEO-SYNEPHRINE) Adult infusion Stopped (10/20/2019 2236)  . propofol (DIPRIVAN) infusion 30 mcg/kg/min (10/25/19 0700)  . vancomycin    . vancomycin Stopped (10/24/19 1445)    PRN Medications: sodium chloride, sodium chloride, albumin human, artificial tears, dextrose, lactated ringers, metoprolol tartrate, midazolam, morphine injection, ondansetron (ZOFRAN) IV, oxyCODONE, sodium chloride flush, sodium chloride flush, traMADol  Assessment/Plan   1. Post-cardiotomy hemorrhagic shock - Patient with refractory post-operative chest bleeding s/p minimally invasive TV repair on 11/12/2019 in setting of longstanding SLE - has received  RBCS, PLTs, TXA, cryo, Factor 7, steroids - now on ECMO support - Repeat OR visit for right chest clot removal on 4/9 with improved hemodynamics, TEE with  EF 40-45%, mild-moderately decreased RV function, stable replaced TV.  - Bleeding has slowed even with platelets down to 36.  - Continue to replace products as needed, 1 unit PRBCs this morning.  - ECMO flow improved on low dose heparin and LDH lower, will continue for now with no bleeding and can give platelet transfusion if they continue to drop (think unlikely HIT).  - Aim for diuresis over the weekend (weight markedly up).  Hopefully back to OR Monday to close chest and decannulate ECMO circuit.  With marked diuresis yesterday, will decrease Lasix to 8 mg/hr today.   2. 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  - plan as above  3. Post-operative respiratory failure - remains intubated, FiO2 0.4.   - Progressive airspace disease in R lung after mini thoracotomy, back to OR 4/9 for right chest clot removal.  - CCM following vent.  4. Acute blood loss anemia - as above - continue to replace as needed  5. Thrombocytopenia - Due to shock/coaguloapthy - Platelets lower today at 36K. LDH lower with heparin gtt, do not think she is hemolyzing more extensively in the ECMO circuit.  Think HIT unlikely.  Suspect platelets have yet to recover from initial insult.  - Continue heparin for now, will transfuse plts if drop < 20K.   6. Convulsive status epilepticus - Noted on 4/10 EEG.  - She is now on Vimpat and Keppra.  - Propofol to 30 - Twitching on exam has resolved.  - Neuro following.   7. SLE - no change  8. Acute on chronic systolic CHF with RV failure - Marked total body fluid overload.  Lasix gtt to 8 mg/hr today with marked diuresis yesterday.    9. AKI - Creatinine 2.25 => 2.49 => 2.57, follow closely. Will slow diuresis a little today.    CRITICAL CARE Performed by: Loralie Champagne  Total critical care time: 45 minutes  Critical care time was exclusive of separately billable procedures and treating other patients.  Critical care was  necessary to treat or prevent imminent or life-threatening deterioration.  Critical care was time spent personally by me (independent of midlevel providers or residents) on the following activities: development of treatment plan with patient and/or surrogate as well as nursing, discussions with consultants, evaluation of patient's response to treatment, examination of patient, obtaining history from patient or surrogate, ordering and performing treatments and interventions, ordering and review of laboratory studies, ordering and review of radiographic studies, pulse oximetry and re-evaluation of patient's condition.    Length of Stay: 4  Loralie Champagne, MD  10/25/2019, 7:24 AM  Advanced Heart Failure Team Pager [phone] (M-F; 7a - 4p)  Please contact [facility] for night-coverage after hours (4p -7a ) and weekends on [email or web address]

## 2019-10-25 NOTE — Progress Notes (Signed)
Unable to obtain accurate CVP from [facility] at 1100, Dr. Tamala Julian aware. No new orders given at this time.

## 2019-10-25 NOTE — Procedures (Addendum)
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 Neurology consulted for right facial twitching noted. EEG to evaluate for seizure  Level of alertness: comatose  AEDs during EEG study: keppra, vimpat, propofol  Technical aspects: This EEG study was done with scalp electrodes positioned according to the 10-20 International system of electrode placement. Electrical activity was acquired at a sampling rate of 500Hz  and reviewed with a high frequency filter of 70Hz  and a low frequency filter of 1Hz . EEG data were recorded continuously and digitally stored.   DESCRIPTION:  At the beginning, eeg showed periodic epileptiform discharges at 2.5-3Hz  in left frontocentral region. After around 1230 on 10/24/2019, periodic epileptiform discharges appeared at 1.5-2Hz .  After around 1330, eeg continued to improve and showed intermittent sharp waves in left frontocentral region. Again after 1800, eeg showed periodic epileptiform discharges at 1.5-2hz .As AEDs were titrated, EEG gradually showed intermittent generalized low amplitude 2-5hz  theta-delta slowing for 2-3 seconds alternating with high amplitude sharply contoured 2-5hz  theta-delta slowing as well as sharp waves in left frontocentral region. Around 0700 on 10/25/2019. eeg again showed generalized periodic epileptiform discharges with overriding fast activity at 2-2.5hz  which again gradually improved around 930am and showed  intermittent generalized low amplitude 2-5hz  theta-delta slowing for 2-3 seconds alternating with high amplitude sharply contoured 2-5hz  theta-delta slowing as well as  sharp waves in left frontocentral region.. Hyperventilation and photic stimulation were not performed.   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 As AED were titrated, eeg improved and showed severe diffuse encephalopathy, non specific to etiology. However, since around 7am on 10/25/2019, eeg appears to be worsening again with generalized periodic epileptiform discharges with overriding fast activity at 2-2.5Hz  consistent with ictal-interictal continuum.   Dr Aroor was intermittently notified.  [name]

## 2019-10-25 NOTE — Progress Notes (Signed)
[place] for heparin Indication: ECMO  Allergies  Allergen Reactions  . Lisinopril     angioedema  . Penicillins Hives    DID THE REACTION INVOLVE: Swelling of the face/tongue/throat, SOB, or low BP? Unknown Sudden or severe rash/hives, skin peeling, or the inside of the mouth or nose? Unknown Did it require medical treatment? Unknown When did it last happen?age 49 If all above answers are "NO", may proceed with cephalosporin use.     Patient Measurements: Height: '5\' 7"'$  (170.2 cm) Weight: 92.4 kg (203 lb 11.3 oz) IBW/kg (Calculated) : 61.6 Heparin Dosing Weight: 71 kg  Vital Signs: Temp: 98.2 F (36.8 C) (04/11 1900) Temp Source: Bladder (04/11 1600) BP: 106/71 (04/11 0758) Pulse Rate: 69 (04/11 1356)  Labs: Recent Labs    10/17/2019 1752 11/01/2019 1803 10/24/19 0356 10/24/19 0407 10/24/19 1708 10/24/19 1716 10/25/19 0510 10/25/19 0519 10/25/19 0657 10/25/19 1108 10/25/19 1229 10/25/19 1229 10/25/19 1706 10/25/19 1715  HGB 11.0*   < > 10.1*   < > 9.2*   < > 8.7*   < >   < >  --  9.6*   < > 9.5* 8.8*  HCT 31.3*   < > 29.3*   < > 26.9*   < > 25.6*   < >   < >  --  28.8*  --  28.8* 26.0*  PLT 86*   < > 68*   < > 47*  --  36*  --   --   --   --   --  30*  --   APTT 39*   < > 39*   < > 65*   < > 66*  --   --  58*  --   --  65*  --   LABPROT 17.1*  --  16.0*  --   --   --  15.1  --   --   --   --   --   --   --   INR 1.4*  --  1.3*  --   --   --  1.2  --   --   --   --   --   --   --   HEPARINUNFRC  --   --   --   --  <0.10*  --  <0.10*  --   --   --   --   --  <0.10*  --   CREATININE 2.25*   < > 2.49*   < > 2.55*  --  2.57*  --   --   --   --   --  2.30*  --    < > = values in this interval not displayed.    Estimated Creatinine Clearance: 35.7 mL/min (A) (by C-G formula based on SCr of 2.3 mg/dL (H)).   Medical History: Past Medical History:  Diagnosis Date  . Avascular necrosis ([place])   . Diabetes mellitus without  complication ([place]) 84/1660  . Hypertension   . Hypothyroidism 01/2019  . Lupus ([place])   . Lupus nephritis ([place])   . Prediabetes 10/2011  . Pulmonary hypertension ([place])   . 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 Tricuspid regurgitation   . 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 chlorhexidine  gluconate (MEDLINE KIT)  15 mL Mouth Rinse BID  . Chlorhexidine Gluconate Cloth  6 each Topical Daily  . free water  200 mL Per Tube Q4H  . insulin aspart  0-24 Units Subcutaneous Q4H  . levothyroxine  50 mcg Oral Q0600  . mouth rinse  15 mL Mouth Rinse 10 times per day  . pantoprazole (PROTONIX) IV  40 mg Intravenous QHS  . senna  1 tablet Oral Daily  . sodium chloride flush  10-40 mL Intracatheter Q12H  . sodium chloride flush  3 mL Intravenous Q12H   Infusions:  . sodium chloride Stopped (10/16/2019 1900)  . sodium chloride 10 mL/hr at 10/25/19 1900  . sodium chloride 20 mL/hr at 10/24/19 0400  . albumin human Stopped (10/25/19 1648)  . albumin human Stopped (10/25/2019 1900)  . dexmedetomidine (PRECEDEX) IV infusion 0.8 mcg/kg/hr (10/25/19 1900)  . dextrose 40 mL/hr at 10/25/19 1900  . dextrose 5 % and 0.45% NaCl Stopped (11/01/2019 1900)  . epinephrine Stopped (11/13/2019 0505)  . feeding supplement (PIVOT 1.5 CAL) 50 mL/hr at 10/25/19 1630  . furosemide (LASIX) infusion 8 mg/hr (10/25/19 1900)  . heparin 500 Units/hr (10/25/19 1900)  . insulin Stopped (10/20/2019 1458)  . lacosamide (VIMPAT) IV Stopped (10/25/19 1438)  . lactated ringers    . lactated ringers    . lactated ringers 100 mL/hr at 11/02/2019 0558  . levETIRAcetam Stopped (10/25/19 1201)  . meropenem (MERREM) IV Stopped (10/25/19 0957)  . midazolam 10 mg/hr (10/25/19 1900)  . milrinone  0.5 mcg/kg/min (10/25/19 1900)  . niCARDipine Stopped (10/25/19 0324)  . nitroGLYCERIN    . norepinephrine (LEVOPHED) Adult infusion Stopped (10/25/19 0316)  . phenylephrine (NEO-SYNEPHRINE) Adult infusion Stopped (11/09/2019 2236)  . potassium chloride 50 mL/hr at 10/25/19 1900  . propofol (DIPRIVAN) infusion 40 mcg/kg/min (10/25/19 1900)  . [START ON 11/07/2019] vancomycin      Assessment: 49 y.o. female admitted on 10/29/2019 with TV repair. Postop course has been complicated by hemorrhagic shock and [facility] ECMO was initiated later on 4/7. Back to OR for re-exploration on 4/8 and 4/9 for washout. Patient remains with open chest.   After discussion with ECMO team, pharmacy has been consulted to initiate low dose heparin for ECMO circuit - no bolus.  Heparin level remains undetectable, on 500 units/hr. APTT is 65. Hgb 8.8, plt down to 30- likely from acute illness and with history of thrombocytopenia. No s/sx of bleeding. Flows continue stable/improving and fibrin in circuit stable. No infusion issues.   Goal of Therapy:  Heparin level 0.3-0.7 units/ml - will hold off targeting heparin level goals for now given recent hemorrhagic shock. Trickle heparin to keep circuit from clotting for now. Reassess with ECMO team daily to assess for dosing adjustments Monitor platelets by anticoagulation protocol: Yes   Plan:  Will continue heparin at 500 units/hr - will defer to MD if want to titrate further Check heparin level q12h, aPTT q6h, and CBC q12h Monitor for bleeding  Antonietta Jewel, PharmD, [facility] Pharmacist  Phone: [phone] 10/25/2019 7:32 PM  Please check AMION for all [place] phone numbers After 10:00 PM, call [place] [phone]

## 2019-10-25 NOTE — Progress Notes (Signed)
2 Days Post-Op Procedure(s) (LRB): Mediastinal washout and Re-exploration of right chest for evacuation of hemothorax (N/A) Application Of Wound Vac using Abthera dressing. (N/A) Subjective: Unable to assess  Objective: Vital signs in last 24 hours: Temp:  [96.6 F (35.9 C)-98.1 F (36.7 C)] 98.1 F (36.7 C) (04/11 0745) Pulse Rate:  [68-91] 68 (04/11 0411) Cardiac Rhythm: Normal sinus rhythm (04/11 0445) Resp:  [0-13] 0 (04/11 0745) BP: (93-101)/(63-76) 93/63 (04/11 0411) SpO2:  [100 %] 100 % (04/11 0745) Arterial Line BP: (83-127)/(60-100) 102/68 (04/11 0745) FiO2 (%):  [40 %-100 %] 40 % (04/11 0411) Weight:  [92.4 kg] 92.4 kg (04/11 0645)  Hemodynamic parameters for last 24 hours: CVP:  [9 mmHg-58 mmHg] 18 mmHg  Intake/Output from previous day: 04/10 0701 - 04/11 0700 In: 2542 [I.V.:2115.8; NG/GT:154.7; IV Piggyback:1293.5] Out: 7062 [BJSEG:3151; Drains:275; Chest Tube:460] Intake/Output this shift: No intake/output data recorded.  General appearance: intubated/sedated Neurologic: undergoing EEG monitoring Heart: regular rate and rhythm, S1, S2 normal, no murmur, click, rub or gallop Lungs: diminished breath sounds bilaterally Abdomen: soft, non-tender; bowel sounds normal; no masses,  no organomegaly Extremities: edema 2+, RUE>LUE Wound: sternal dressing intact  Lab Results: Recent Labs    10/24/19 1708 10/24/19 1716 10/25/19 0510 10/25/19 0510 10/25/19 0519 10/25/19 0657  WBC 5.2  --  4.2  --   --   --   HGB 9.2*   < > 8.7*   < > 7.5* 7.5*  HCT 26.9*   < > 25.6*   < > 22.0* 22.0*  PLT 47*  --  36*  --   --   --    < > = values in this interval not displayed.   BMET:  Recent Labs    10/24/19 1708 10/24/19 1716 10/25/19 0510 10/25/19 0510 10/25/19 0519 10/25/19 0657  NA 147*   < > 145   < > 145 144  K 3.8   < > 3.9   < > 3.9 3.7  CL 113*  --  109  --   --   --   CO2 25  --  26  --   --   --   GLUCOSE 66*  --  87  --   --   --   BUN 34*  --  34*   --   --   --   CREATININE 2.55*  --  2.57*  --   --   --   CALCIUM 8.2*  --  8.5*  --   --   --    < > = values in this interval not displayed.    PT/INR:  Recent Labs    10/25/19 0510  LABPROT 15.1  INR 1.2   ABG    Component Value Date/Time   PHART 7.433 10/25/2019 0657   HCO3 30.5 (H) 10/25/2019 0657   TCO2 32 10/25/2019 0657   ACIDBASEDEF 1.0 11/01/2019 1708   O2SAT 100.0 10/25/2019 0657   CBG (last 3)  Recent Labs    10/24/19 1945 10/24/19 2358 10/25/19 0313  GLUCAP 77 78 88    Assessment/Plan: S/P Procedure(s) (LRB): Mediastinal washout and Re-exploration of right chest for evacuation of hemothorax (N/A) Application Of Wound Vac using Abthera dressing. 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

## 2019-10-25 NOTE — Progress Notes (Signed)
CDS notified of pt condition. Pt is registered donor, CDS to do chart review. CDS will call for follow up. RN to call CDS if any changes in plan of care.

## 2019-10-25 NOTE — Progress Notes (Signed)
[place] for heparin Indication: ECMO  Allergies  Allergen Reactions  . Lisinopril     angioedema  . Penicillins Hives    DID THE REACTION INVOLVE: Swelling of the face/tongue/throat, SOB, or low BP? Unknown Sudden or severe rash/hives, skin peeling, or the inside of the mouth or nose? Unknown Did it require medical treatment? Unknown When did it last happen?age 49 If all above answers are "NO", may proceed with cephalosporin use.     Patient Measurements: Height: '5\' 7"'$  (170.2 cm) Weight: 92.4 kg (203 lb 11.3 oz) IBW/kg (Calculated) : 61.6 Heparin Dosing Weight: 71 kg  Vital Signs: Temp: 98.1 F (36.7 C) (04/11 0745) BP: 93/63 (04/11 0411) Pulse Rate: 68 (04/11 0411)  Labs: Recent Labs    10/30/2019 2202 10/20/2019 2209 10/18/2019 1752 11/07/2019 1803 10/24/19 0356 10/24/19 0407 10/24/19 1708 10/24/19 1716 10/24/19 2228 10/24/19 2232 10/25/19 0510 10/25/19 0510 10/25/19 0519 10/25/19 0657  HGB 8.0*   < > 11.0*   < > 10.1*   < > 9.2*   < >  --    < > 8.7*   < > 7.5* 7.5*  HCT 22.7*   < > 31.3*   < > 29.3*   < > 26.9*   < >  --    < > 25.6*  --  22.0* 22.0*  PLT 61*   < > 86*   < > 68*  --  47*  --   --   --  36*  --   --   --   APTT 43*   < > 39*   < > 39*   < > 65*  --  74*  --  66*  --   --   --   LABPROT 17.7*   < > 17.1*  --  16.0*  --   --   --   --   --  15.1  --   --   --   INR 1.5*   < > 1.4*  --  1.3*  --   --   --   --   --  1.2  --   --   --   HEPARINUNFRC <0.10*  --   --   --   --   --  <0.10*  --   --   --  <0.10*  --   --   --   CREATININE 1.78*   < > 2.25*   < > 2.49*  --  2.55*  --   --   --  2.57*  --   --   --    < > = values in this interval not displayed.    Estimated Creatinine Clearance: 31.9 mL/min (A) (by C-G formula based on SCr of 2.57 mg/dL (H)).   Medical History: Past Medical History:  Diagnosis Date  . Avascular necrosis ([place])   . Diabetes mellitus without complication ([place]) 61/4431  .  Hypertension   . Hypothyroidism 01/2019  . Lupus ([place])   . Lupus nephritis ([place])   . Prediabetes 10/2011  . Pulmonary hypertension ([place])   . S/P minimally invasive tricuspid valve repair 10/27/2019   Complex valvuloplasty including autologous pericardial patch augmentation with suture plication of commissures and 32 mm Edwards mc3 ring annuloplasty via right mini thoracotomy approach  . Tricuspid regurgitation   . 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 sodium chloride   Intravenous Once  .  chlorhexidine gluconate (MEDLINE KIT)  15 mL Mouth Rinse BID  . Chlorhexidine Gluconate Cloth  6 each Topical Daily  . free water  200 mL Per Tube Q4H  . insulin aspart  0-24 Units Subcutaneous Q4H  . levothyroxine  50 mcg Oral Q0600  . mouth rinse  15 mL Mouth Rinse 10 times per day  . pantoprazole (PROTONIX) IV  40 mg Intravenous QHS  . senna  1 tablet Oral Daily  . sodium chloride flush  10-40 mL Intracatheter Q12H  . sodium chloride flush  3 mL Intravenous Q12H   Infusions:  . sodium chloride Stopped (10/25/2019 1900)  . sodium chloride 10 mL/hr at 10/24/19 0000  . sodium chloride 20 mL/hr at 10/24/19 0400  . albumin human Stopped (10/25/19 0409)  . albumin human Stopped (10/19/2019 1900)  . dexmedetomidine (PRECEDEX) IV infusion 0.8 mcg/kg/hr (10/25/19 0700)  . dextrose 40 mL/hr at 10/25/19 0700  . dextrose 5 % and 0.45% NaCl Stopped (11/07/2019 1900)  . epinephrine Stopped (11/13/2019 0505)  . feeding supplement (PIVOT 1.5 CAL) 20 mL/hr at 10/25/19 0700  . furosemide (LASIX) infusion 8 mg/hr (10/25/19 0738)  . heparin 500 Units/hr (10/25/19 0700)  . insulin Stopped (10/30/2019 1458)  . lacosamide (VIMPAT) IV Stopped (10/25/19 0331)  . lactated ringers    . lactated ringers    . lactated ringers 100 mL/hr at 11/12/2019 0558  . levETIRAcetam Stopped (10/25/19 0033)  . meropenem (MERREM) IV Stopped (10/24/19 2141)  . milrinone 0.5 mcg/kg/min (10/25/19 0700)   . niCARDipine Stopped (10/25/19 0324)  . nitroGLYCERIN    . norepinephrine (LEVOPHED) Adult infusion Stopped (10/25/19 0316)  . phenylephrine (NEO-SYNEPHRINE) Adult infusion Stopped (11/04/2019 2236)  . propofol (DIPRIVAN) infusion 30 mcg/kg/min (10/25/19 0700)  . vancomycin    . vancomycin Stopped (10/24/19 1445)    Assessment: 49 y.o. female admitted on 10/29/2019 with TV repair. Postop course has been complicated by hemorrhagic shock and VA ECMO was initiated later on 4/7. Back to OR for re-exploration on 4/8 and 4/9 for washout. Patient remains with open chest.   After discussion with ECMO team, pharmacy has been consulted to initiate low dose heparin for ECMO circuit - no bolus.  Heparin level remains undetectable, on 500 units/hr. APTT is 66. Hgb 8.7, plt down to 36 - likely from acute illness and with history of thrombocytopenia. No bleeding per RN. Flows improving and fibrin in circuit stable. No infusion issues.   Goal of Therapy:  Heparin level 0.3-0.7 units/ml - will hold off targeting heparin level goals for now given recent hemorrhagic shock. Trickle heparin to keep circuit from clotting for now. Reassess with ECMO team daily to assess for dosing adjustments Monitor platelets by anticoagulation protocol: Yes   Plan:  Will continue heparin at 500 units/hr - will defer to MD if want to titrate further Check heparin level q12h, aPTT q6h, and CBC q12h Monitor for bleeding  Vertis Kelch, PharmD, [facility] PGY2 Cardiology Pharmacy Resident Phone [phone] 10/25/2019       8:05 AM  Please check [email or web address] for unit-specific pharmacist phone numbers

## 2019-10-25 NOTE — Progress Notes (Signed)
Dr. Lorraine Lax called unit, updated on pt condition. MD gave VO to increase versed gtt to 10mg /hr.

## 2019-10-25 NOTE — Progress Notes (Signed)
Neurology MD Dr. Lorraine Lax at bedside, updated on pt condition. MD placed orders to start versed gtt, gave VO to increase propfol gtt to 38mcg/kg/min.

## 2019-10-25 NOTE — Plan of Care (Signed)
EEG reviewed with Dr. Lorraine Lax at signout.  About the last 1 hour showing burst suppression with burst every 5 to 8 seconds. Plan to uptitrate Versed if EEG becomes more active. We will follow  -- Amie Portland, MD [facility]

## 2019-10-25 NOTE — Plan of Care (Signed)
  Problem: Education: Goal: Will demonstrate proper wound care and an understanding of methods to prevent future damage Outcome: Not Progressing Goal: Knowledge of disease or condition will improve Outcome: Not Progressing Goal: Knowledge of the prescribed therapeutic regimen will improve Outcome: Not Progressing Goal: Individualized Educational Video(s) Outcome: Not Progressing   Problem: Activity: Goal: Risk for activity intolerance will decrease Outcome: Not Progressing   Problem: Cardiac: Goal: Will achieve and/or maintain hemodynamic stability Outcome: Not Progressing   Problem: Clinical Measurements: Goal: Postoperative complications will be avoided or minimized Outcome: Not Progressing   Problem: Respiratory: Goal: Respiratory status will improve Outcome: Not Progressing   Problem: Skin Integrity: Goal: Wound healing without signs and symptoms of infection Outcome: Not Progressing Goal: Risk for impaired skin integrity will decrease Outcome: Not Progressing   Problem: Urinary Elimination: Goal: Ability to achieve and maintain adequate renal perfusion and functioning will improve Outcome: Not Progressing   Problem: Education: Goal: Knowledge of General Education information will improve Description: Including pain rating scale, medication(s)/side effects and non-pharmacologic comfort measures Outcome: Not Progressing   Problem: Health Behavior/Discharge Planning: Goal: Ability to manage health-related needs will improve Outcome: Not Progressing   Problem: Clinical Measurements: Goal: Ability to maintain clinical measurements within normal limits will improve Outcome: Not Progressing Goal: Will remain free from infection Outcome: Not Progressing Goal: Diagnostic test results will improve Outcome: Not Progressing Goal: Respiratory complications will improve Outcome: Not Progressing Goal: Cardiovascular complication will be avoided Outcome: Not Progressing    Problem: Activity: Goal: Risk for activity intolerance will decrease Outcome: Not Progressing   Problem: Nutrition: Goal: Adequate nutrition will be maintained Outcome: Not Progressing   Problem: Coping: Goal: Level of anxiety will decrease Outcome: Not Progressing   Problem: Elimination: Goal: Will not experience complications related to bowel motility Outcome: Not Progressing Goal: Will not experience complications related to urinary retention Outcome: Not Progressing   Problem: Pain Managment: Goal: General experience of comfort will improve Outcome: Not Progressing   Problem: Safety: Goal: Ability to remain free from injury will improve Outcome: Not Progressing   Problem: Skin Integrity: Goal: Risk for impaired skin integrity will decrease Outcome: Not Progressing   Problem: Activity: Goal: Ability to tolerate increased activity will improve Outcome: Not Progressing   Problem: Respiratory: Goal: Ability to maintain a clear airway and adequate ventilation will improve Outcome: Not Progressing   Problem: Role Relationship: Goal: Method of communication will improve Outcome: Not Progressing

## 2019-10-26 ENCOUNTER — Encounter (HOSPITAL_COMMUNITY): Payer: Self-pay | Admitting: Thoracic Surgery (Cardiothoracic Vascular Surgery)

## 2019-10-26 ENCOUNTER — Inpatient Hospital Stay (HOSPITAL_COMMUNITY)
Admission: RE | Disposition: E | Payer: Self-pay | Source: Home / Self Care | Attending: Thoracic Surgery (Cardiothoracic Vascular Surgery)

## 2019-10-26 ENCOUNTER — Inpatient Hospital Stay (HOSPITAL_COMMUNITY): Payer: BC Managed Care – PPO | Admitting: Anesthesiology

## 2019-10-26 ENCOUNTER — Inpatient Hospital Stay (HOSPITAL_COMMUNITY): Payer: BC Managed Care – PPO

## 2019-10-26 DIAGNOSIS — I9789 Other postprocedural complications and disorders of the circulatory system, not elsewhere classified: Secondary | ICD-10-CM | POA: Diagnosis not present

## 2019-10-26 DIAGNOSIS — G40901 Epilepsy, unspecified, not intractable, with status epilepticus: Secondary | ICD-10-CM | POA: Diagnosis not present

## 2019-10-26 HISTORY — PX: MEDIASTERNOTOMY: SHX5084

## 2019-10-26 HISTORY — PX: TEE WITHOUT CARDIOVERSION: SHX5443

## 2019-10-26 LAB — CBC
HCT: 27.8 % — ABNORMAL LOW (ref 36.0–46.0)
HCT: 32.1 % — ABNORMAL LOW (ref 36.0–46.0)
HCT: 33.7 % — ABNORMAL LOW (ref 36.0–46.0)
Hemoglobin: 9.3 g/dL — ABNORMAL LOW (ref 12.0–15.0)
Hemoglobin: 10.8 g/dL — ABNORMAL LOW (ref 12.0–15.0)
Hemoglobin: 11.2 g/dL — ABNORMAL LOW (ref 12.0–15.0)
MCH: 30.1 pg (ref 26.0–34.0)
MCH: 30.2 pg (ref 26.0–34.0)
MCH: 29.5 pg (ref 26.0–34.0)
MCHC: 33.5 g/dL (ref 30.0–36.0)
MCHC: 33.6 g/dL (ref 30.0–36.0)
MCHC: 33.2 g/dL (ref 30.0–36.0)
MCV: 90 fL (ref 80.0–100.0)
MCV: 89.7 fL (ref 80.0–100.0)
MCV: 88.7 fL (ref 80.0–100.0)
Platelets: 33 10*3/uL — ABNORMAL LOW (ref 150–400)
Platelets: 126 10*3/uL — ABNORMAL LOW (ref 150–400)
Platelets: 107 10*3/uL — ABNORMAL LOW (ref 150–400)
RBC: 3.09 MIL/uL — ABNORMAL LOW (ref 3.87–5.11)
RBC: 3.58 MIL/uL — ABNORMAL LOW (ref 3.87–5.11)
RBC: 3.8 MIL/uL — ABNORMAL LOW (ref 3.87–5.11)
RDW: 16.1 % — ABNORMAL HIGH (ref 11.5–15.5)
RDW: 15.5 % (ref 11.5–15.5)
RDW: 15.9 % — ABNORMAL HIGH (ref 11.5–15.5)
WBC: 4.2 10*3/uL (ref 4.0–10.5)
WBC: 6 10*3/uL (ref 4.0–10.5)
WBC: 6.6 10*3/uL (ref 4.0–10.5)
nRBC: 1.4 % — ABNORMAL HIGH (ref 0.0–0.2)
nRBC: 0.8 % — ABNORMAL HIGH (ref 0.0–0.2)
nRBC: 1.1 % — ABNORMAL HIGH (ref 0.0–0.2)

## 2019-10-26 LAB — POCT I-STAT, CHEM 8
BUN: 13 mg/dL (ref 6–20)
BUN: 14 mg/dL (ref 6–20)
Calcium, Ion: 0.54 mmol/L — CL (ref 1.15–1.40)
Calcium, Ion: 0.67 mmol/L — CL (ref 1.15–1.40)
Chloride: 112 mmol/L — ABNORMAL HIGH (ref 98–111)
Chloride: 111 mmol/L (ref 98–111)
Creatinine, Ser: 0.7 mg/dL (ref 0.44–1.00)
Creatinine, Ser: 0.8 mg/dL (ref 0.44–1.00)
Glucose, Bld: 160 mg/dL — ABNORMAL HIGH (ref 70–99)
Glucose, Bld: 183 mg/dL — ABNORMAL HIGH (ref 70–99)
HCT: 26 % — ABNORMAL LOW (ref 36.0–46.0)
HCT: 24 % — ABNORMAL LOW (ref 36.0–46.0)
Hemoglobin: 8.8 g/dL — ABNORMAL LOW (ref 12.0–15.0)
Hemoglobin: 8.2 g/dL — ABNORMAL LOW (ref 12.0–15.0)
Potassium: 4.1 mmol/L (ref 3.5–5.1)
Potassium: 4.7 mmol/L (ref 3.5–5.1)
Sodium: 150 mmol/L — ABNORMAL HIGH (ref 135–145)
Sodium: 146 mmol/L — ABNORMAL HIGH (ref 135–145)
TCO2: 23 mmol/L (ref 22–32)
TCO2: 20 mmol/L — ABNORMAL LOW (ref 22–32)

## 2019-10-26 LAB — POCT I-STAT 7, (LYTES, BLD GAS, ICA,H+H)
Acid-Base Excess: 6 mmol/L — ABNORMAL HIGH (ref 0.0–2.0)
Acid-Base Excess: 4 mmol/L — ABNORMAL HIGH (ref 0.0–2.0)
Acid-Base Excess: 8 mmol/L — ABNORMAL HIGH (ref 0.0–2.0)
Acid-Base Excess: 3 mmol/L — ABNORMAL HIGH (ref 0.0–2.0)
Acid-Base Excess: 2 mmol/L (ref 0.0–2.0)
Acid-Base Excess: 5 mmol/L — ABNORMAL HIGH (ref 0.0–2.0)
Acid-Base Excess: 3 mmol/L — ABNORMAL HIGH (ref 0.0–2.0)
Acid-base deficit: 4 mmol/L — ABNORMAL HIGH (ref 0.0–2.0)
Acid-base deficit: 6 mmol/L — ABNORMAL HIGH (ref 0.0–2.0)
Acid-base deficit: 1 mmol/L (ref 0.0–2.0)
Bicarbonate: 30.9 mmol/L — ABNORMAL HIGH (ref 20.0–28.0)
Bicarbonate: 16.1 mmol/L — ABNORMAL LOW (ref 20.0–28.0)
Bicarbonate: 19.6 mmol/L — ABNORMAL LOW (ref 20.0–28.0)
Bicarbonate: 20.8 mmol/L (ref 20.0–28.0)
Bicarbonate: 27.1 mmol/L (ref 20.0–28.0)
Bicarbonate: 27.9 mmol/L (ref 20.0–28.0)
Bicarbonate: 29.2 mmol/L — ABNORMAL HIGH (ref 20.0–28.0)
Bicarbonate: 29.9 mmol/L — ABNORMAL HIGH (ref 20.0–28.0)
Bicarbonate: 33.5 mmol/L — ABNORMAL HIGH (ref 20.0–28.0)
Bicarbonate: 31 mmol/L — ABNORMAL HIGH (ref 20.0–28.0)
Bicarbonate: 28.6 mmol/L — ABNORMAL HIGH (ref 20.0–28.0)
Calcium, Ion: 1.18 mmol/L (ref 1.15–1.40)
Calcium, Ion: 0.54 mmol/L — CL (ref 1.15–1.40)
Calcium, Ion: 0.62 mmol/L — CL (ref 1.15–1.40)
Calcium, Ion: 0.62 mmol/L — CL (ref 1.15–1.40)
Calcium, Ion: 0.79 mmol/L — CL (ref 1.15–1.40)
Calcium, Ion: 1.09 mmol/L — ABNORMAL LOW (ref 1.15–1.40)
Calcium, Ion: 1.15 mmol/L (ref 1.15–1.40)
Calcium, Ion: 1.16 mmol/L (ref 1.15–1.40)
Calcium, Ion: 1.2 mmol/L (ref 1.15–1.40)
Calcium, Ion: 1.17 mmol/L (ref 1.15–1.40)
Calcium, Ion: 1.17 mmol/L (ref 1.15–1.40)
HCT: 24 % — ABNORMAL LOW (ref 36.0–46.0)
HCT: 20 % — ABNORMAL LOW (ref 36.0–46.0)
HCT: 27 % — ABNORMAL LOW (ref 36.0–46.0)
HCT: 25 % — ABNORMAL LOW (ref 36.0–46.0)
HCT: 26 % — ABNORMAL LOW (ref 36.0–46.0)
HCT: 29 % — ABNORMAL LOW (ref 36.0–46.0)
HCT: 32 % — ABNORMAL LOW (ref 36.0–46.0)
HCT: 33 % — ABNORMAL LOW (ref 36.0–46.0)
HCT: 33 % — ABNORMAL LOW (ref 36.0–46.0)
HCT: 30 % — ABNORMAL LOW (ref 36.0–46.0)
HCT: 32 % — ABNORMAL LOW (ref 36.0–46.0)
Hemoglobin: 8.2 g/dL — ABNORMAL LOW (ref 12.0–15.0)
Hemoglobin: 6.8 g/dL — CL (ref 12.0–15.0)
Hemoglobin: 9.2 g/dL — ABNORMAL LOW (ref 12.0–15.0)
Hemoglobin: 8.5 g/dL — ABNORMAL LOW (ref 12.0–15.0)
Hemoglobin: 10.9 g/dL — ABNORMAL LOW (ref 12.0–15.0)
Hemoglobin: 11.2 g/dL — ABNORMAL LOW (ref 12.0–15.0)
Hemoglobin: 11.2 g/dL — ABNORMAL LOW (ref 12.0–15.0)
Hemoglobin: 8.8 g/dL — ABNORMAL LOW (ref 12.0–15.0)
Hemoglobin: 9.9 g/dL — ABNORMAL LOW (ref 12.0–15.0)
Hemoglobin: 10.2 g/dL — ABNORMAL LOW (ref 12.0–15.0)
Hemoglobin: 10.9 g/dL — ABNORMAL LOW (ref 12.0–15.0)
O2 Saturation: 100 %
O2 Saturation: 100 %
O2 Saturation: 100 %
O2 Saturation: 100 %
O2 Saturation: 100 %
O2 Saturation: 100 %
O2 Saturation: 100 %
O2 Saturation: 76 %
O2 Saturation: 99 %
O2 Saturation: 100 %
O2 Saturation: 97 %
Patient temperature: 36.8
Patient temperature: 36.4
Patient temperature: 36.4
Patient temperature: 36.4
Patient temperature: 36
Patient temperature: 37.1
Potassium: 3.8 mmol/L (ref 3.5–5.1)
Potassium: 3.3 mmol/L — ABNORMAL LOW (ref 3.5–5.1)
Potassium: 4.1 mmol/L (ref 3.5–5.1)
Potassium: 3.6 mmol/L (ref 3.5–5.1)
Potassium: 3.8 mmol/L (ref 3.5–5.1)
Potassium: 3.9 mmol/L (ref 3.5–5.1)
Potassium: 4.1 mmol/L (ref 3.5–5.1)
Potassium: 4.1 mmol/L (ref 3.5–5.1)
Potassium: 4.1 mmol/L (ref 3.5–5.1)
Potassium: 4 mmol/L (ref 3.5–5.1)
Potassium: 4.1 mmol/L (ref 3.5–5.1)
Sodium: 141 mmol/L (ref 135–145)
Sodium: 151 mmol/L — ABNORMAL HIGH (ref 135–145)
Sodium: 152 mmol/L — ABNORMAL HIGH (ref 135–145)
Sodium: 149 mmol/L — ABNORMAL HIGH (ref 135–145)
Sodium: 142 mmol/L (ref 135–145)
Sodium: 142 mmol/L (ref 135–145)
Sodium: 143 mmol/L (ref 135–145)
Sodium: 143 mmol/L (ref 135–145)
Sodium: 143 mmol/L (ref 135–145)
Sodium: 142 mmol/L (ref 135–145)
Sodium: 142 mmol/L (ref 135–145)
TCO2: 32 mmol/L (ref 22–32)
TCO2: 17 mmol/L — ABNORMAL LOW (ref 22–32)
TCO2: 21 mmol/L — ABNORMAL LOW (ref 22–32)
TCO2: 21 mmol/L — ABNORMAL LOW (ref 22–32)
TCO2: 29 mmol/L (ref 22–32)
TCO2: 29 mmol/L (ref 22–32)
TCO2: 31 mmol/L (ref 22–32)
TCO2: 31 mmol/L (ref 22–32)
TCO2: 35 mmol/L — ABNORMAL HIGH (ref 22–32)
TCO2: 33 mmol/L — ABNORMAL HIGH (ref 22–32)
TCO2: 30 mmol/L (ref 22–32)
pCO2 arterial: 48.2 mmHg — ABNORMAL HIGH (ref 32.0–48.0)
pCO2 arterial: 19.3 mmHg — CL (ref 32.0–48.0)
pCO2 arterial: 29.5 mmHg — ABNORMAL LOW (ref 32.0–48.0)
pCO2 arterial: 22.6 mmHg — ABNORMAL LOW (ref 32.0–48.0)
pCO2 arterial: 45.7 mmHg (ref 32.0–48.0)
pCO2 arterial: 50 mmHg — ABNORMAL HIGH (ref 32.0–48.0)
pCO2 arterial: 51.8 mmHg — ABNORMAL HIGH (ref 32.0–48.0)
pCO2 arterial: 53.2 mmHg — ABNORMAL HIGH (ref 32.0–48.0)
pCO2 arterial: 56.8 mmHg — ABNORMAL HIGH (ref 32.0–48.0)
pCO2 arterial: 50.9 mmHg — ABNORMAL HIGH (ref 32.0–48.0)
pCO2 arterial: 50.2 mmHg — ABNORMAL HIGH (ref 32.0–48.0)
pH, Arterial: 7.414 (ref 7.350–7.450)
pH, Arterial: 7.431 (ref 7.350–7.450)
pH, Arterial: 7.53 — ABNORMAL HIGH (ref 7.350–7.450)
pH, Arterial: 7.572 — ABNORMAL HIGH (ref 7.350–7.450)
pH, Arterial: 7.287 — ABNORMAL LOW (ref 7.350–7.450)
pH, Arterial: 7.366 (ref 7.350–7.450)
pH, Arterial: 7.371 (ref 7.350–7.450)
pH, Arterial: 7.392 (ref 7.350–7.450)
pH, Arterial: 7.407 (ref 7.350–7.450)
pH, Arterial: 7.389 (ref 7.350–7.450)
pH, Arterial: 7.364 (ref 7.350–7.450)
pO2, Arterial: 266 mmHg — ABNORMAL HIGH (ref 83.0–108.0)
pO2, Arterial: 456 mmHg — ABNORMAL HIGH (ref 83.0–108.0)
pO2, Arterial: 458 mmHg — ABNORMAL HIGH (ref 83.0–108.0)
pO2, Arterial: 542 mmHg — ABNORMAL HIGH (ref 83.0–108.0)
pO2, Arterial: 119 mmHg — ABNORMAL HIGH (ref 83.0–108.0)
pO2, Arterial: 175 mmHg — ABNORMAL HIGH (ref 83.0–108.0)
pO2, Arterial: 230 mmHg — ABNORMAL HIGH (ref 83.0–108.0)
pO2, Arterial: 425 mmHg — ABNORMAL HIGH (ref 83.0–108.0)
pO2, Arterial: 46 mmHg — ABNORMAL LOW (ref 83.0–108.0)
pO2, Arterial: 201 mmHg — ABNORMAL HIGH (ref 83.0–108.0)
pO2, Arterial: 97 mmHg (ref 83.0–108.0)

## 2019-10-26 LAB — BASIC METABOLIC PANEL
Anion gap: 11 (ref 5–15)
Anion gap: 12 (ref 5–15)
BUN: 40 mg/dL — ABNORMAL HIGH (ref 6–20)
BUN: 42 mg/dL — ABNORMAL HIGH (ref 6–20)
CO2: 28 mmol/L (ref 22–32)
CO2: 26 mmol/L (ref 22–32)
Calcium: 8.5 mg/dL — ABNORMAL LOW (ref 8.9–10.3)
Calcium: 8.2 mg/dL — ABNORMAL LOW (ref 8.9–10.3)
Chloride: 104 mmol/L (ref 98–111)
Chloride: 104 mmol/L (ref 98–111)
Creatinine, Ser: 2.48 mg/dL — ABNORMAL HIGH (ref 0.44–1.00)
Creatinine, Ser: 2.41 mg/dL — ABNORMAL HIGH (ref 0.44–1.00)
GFR calc Af Amer: 26 mL/min — ABNORMAL LOW (ref >60)
GFR calc Af Amer: 27 mL/min — ABNORMAL LOW (ref >60)
GFR calc non Af Amer: 23 mL/min — ABNORMAL LOW (ref >60)
GFR calc non Af Amer: 23 mL/min — ABNORMAL LOW (ref >60)
Glucose, Bld: 235 mg/dL — ABNORMAL HIGH (ref 70–99)
Glucose, Bld: 104 mg/dL — ABNORMAL HIGH (ref 70–99)
Potassium: 4.1 mmol/L (ref 3.5–5.1)
Potassium: 4.1 mmol/L (ref 3.5–5.1)
Sodium: 143 mmol/L (ref 135–145)
Sodium: 142 mmol/L (ref 135–145)

## 2019-10-26 LAB — ECHO INTRAOPERATIVE TEE
Height: 67 in
Weight: 3298.08 oz

## 2019-10-26 LAB — COOXEMETRY PANEL
Carboxyhemoglobin: 1.8 % — ABNORMAL HIGH (ref 0.5–1.5)
Carboxyhemoglobin: 2 % — ABNORMAL HIGH (ref 0.5–1.5)
Methemoglobin: 1.5 % (ref 0.0–1.5)
Methemoglobin: 1.7 % — ABNORMAL HIGH (ref 0.0–1.5)
O2 Saturation: 99.5 %
O2 Saturation: 87.6 %
Total hemoglobin: 10.9 g/dL — ABNORMAL LOW (ref 12.0–16.0)
Total hemoglobin: 9.9 g/dL — ABNORMAL LOW (ref 12.0–16.0)

## 2019-10-26 LAB — PROTIME-INR
INR: 1.1 (ref 0.8–1.2)
INR: 1.1 (ref 0.8–1.2)
Prothrombin Time: 13.7 seconds (ref 11.4–15.2)
Prothrombin Time: 14.2 seconds (ref 11.4–15.2)

## 2019-10-26 LAB — HEPATIC FUNCTION PANEL
ALT: 58 U/L — ABNORMAL HIGH (ref 0–44)
AST: 60 U/L — ABNORMAL HIGH (ref 15–41)
Albumin: 2.6 g/dL — ABNORMAL LOW (ref 3.5–5.0)
Alkaline Phosphatase: 94 U/L (ref 38–126)
Bilirubin, Direct: 0.5 mg/dL — ABNORMAL HIGH (ref 0.0–0.2)
Indirect Bilirubin: 1.1 mg/dL — ABNORMAL HIGH (ref 0.3–0.9)
Total Bilirubin: 1.6 mg/dL — ABNORMAL HIGH (ref 0.3–1.2)
Total Protein: 4.3 g/dL — ABNORMAL LOW (ref 6.5–8.1)

## 2019-10-26 LAB — GLUCOSE, CAPILLARY
Glucose-Capillary: 139 mg/dL — ABNORMAL HIGH (ref 70–99)
Glucose-Capillary: 168 mg/dL — ABNORMAL HIGH (ref 70–99)
Glucose-Capillary: 177 mg/dL — ABNORMAL HIGH (ref 70–99)
Glucose-Capillary: 229 mg/dL — ABNORMAL HIGH (ref 70–99)
Glucose-Capillary: 235 mg/dL — ABNORMAL HIGH (ref 70–99)
Glucose-Capillary: 79 mg/dL (ref 70–99)

## 2019-10-26 LAB — TRIGLYCERIDES: Triglycerides: 126 mg/dL (ref <150)

## 2019-10-26 LAB — FIBRINOGEN: Fibrinogen: 552 mg/dL — ABNORMAL HIGH (ref 210–475)

## 2019-10-26 LAB — APTT
aPTT: 63 seconds — ABNORMAL HIGH (ref 24–36)
aPTT: 57 seconds — ABNORMAL HIGH (ref 24–36)
aPTT: 40 seconds — ABNORMAL HIGH (ref 24–36)

## 2019-10-26 LAB — LACTATE DEHYDROGENASE: LDH: 436 U/L — ABNORMAL HIGH (ref 98–192)

## 2019-10-26 LAB — MAGNESIUM: Magnesium: 2.4 mg/dL (ref 1.7–2.4)

## 2019-10-26 LAB — PROCALCITONIN: Procalcitonin: 4.09 ng/mL

## 2019-10-26 LAB — HEPARIN LEVEL (UNFRACTIONATED): Heparin Unfractionated: <0.1 IU/mL — ABNORMAL LOW (ref 0.30–0.70)

## 2019-10-26 SURGERY — MEDIAN STERNOTOMY
Anesthesia: General

## 2019-10-26 MED ORDER — PHENYLEPHRINE HCL-NACL 20-0.9 MG/250ML-% IV SOLN: 0.0000 ug/min | INTRAVENOUS | Status: DC

## 2019-10-26 MED ORDER — ORAL CARE MOUTH RINSE
15.0000 mL | OROMUCOSAL | Status: DC
  Administered 2019-10-26 – 2019-11-10 (×148): 15 mL via OROMUCOSAL

## 2019-10-26 MED ORDER — BISACODYL 5 MG PO TBEC
10.0000 mg | DELAYED_RELEASE_TABLET | Freq: Every day | ORAL | Status: DC
  Administered 2019-10-30: 10 mg via ORAL
  Filled 2019-10-26: qty 2

## 2019-10-26 MED ORDER — ARTIFICIAL TEARS OPHTHALMIC OINT
TOPICAL_OINTMENT | OPHTHALMIC | Status: DC | PRN
  Administered 2019-10-26: 1 via OPHTHALMIC

## 2019-10-26 MED ORDER — DEXMEDETOMIDINE HCL IN NACL 400 MCG/100ML IV SOLN: 0.0000 ug/kg/h | INTRAVENOUS | Status: DC

## 2019-10-26 MED ORDER — ALBUMIN HUMAN 5 % IV SOLN
250.0000 mL | INTRAVENOUS | Status: DC | PRN
  Administered 2019-10-26 – 2019-10-27 (×3): 12.5 g via INTRAVENOUS
  Filled 2019-10-26 (×2): qty 250

## 2019-10-26 MED ORDER — SODIUM CHLORIDE 0.45 % IV SOLN: INTRAVENOUS | Status: DC | PRN

## 2019-10-26 MED ORDER — AMIODARONE HCL IN DEXTROSE 360-4.14 MG/200ML-% IV SOLN
60.0000 mg/h | INTRAVENOUS | Status: AC
  Administered 2019-10-26: 60 mg/h via INTRAVENOUS

## 2019-10-26 MED ORDER — CHLORHEXIDINE GLUCONATE CLOTH 2 % EX PADS
6.0000 | MEDICATED_PAD | Freq: Every day | CUTANEOUS | Status: DC
  Administered 2019-10-26 – 2019-11-09 (×16): 6 via TOPICAL

## 2019-10-26 MED ORDER — SODIUM CHLORIDE 0.9% FLUSH: 3.0000 mL | Freq: Two times a day (BID) | INTRAVENOUS | Status: DC

## 2019-10-26 MED ORDER — BISACODYL 10 MG RE SUPP
10.0000 mg | Freq: Every day | RECTAL | Status: DC
  Administered 2019-10-28 – 2019-10-29 (×2): 10 mg via RECTAL
  Filled 2019-10-26 (×2): qty 1

## 2019-10-26 MED ORDER — CALCIUM CHLORIDE 10 % IV SOLN
INTRAVENOUS | Status: DC | PRN
  Administered 2019-10-26: 500 mg via INTRAVENOUS

## 2019-10-26 MED ORDER — MIDAZOLAM BOLUS VIA INFUSION
5.0000 mg | Freq: Once | INTRAVENOUS | Status: AC
  Administered 2019-10-26: 5 mg via INTRAVENOUS
  Filled 2019-10-26: qty 5

## 2019-10-26 MED ORDER — SODIUM CHLORIDE 0.9 % IV SOLN
INTRAVENOUS | Status: DC | PRN
  Administered 2019-10-30: 250 mL via INTRAVENOUS

## 2019-10-26 MED ORDER — SODIUM CHLORIDE 0.9 % IV SOLN: INTRAVENOUS | Status: DC | PRN

## 2019-10-26 MED ORDER — SODIUM CHLORIDE 0.9 % IV SOLN: INTRAVENOUS | Status: DC

## 2019-10-26 MED ORDER — PROPOFOL 10 MG/ML IV BOLUS
INTRAVENOUS | Status: AC
  Filled 2019-10-26: qty 20

## 2019-10-26 MED ORDER — AMIODARONE HCL IN DEXTROSE 360-4.14 MG/200ML-% IV SOLN
30.0000 mg/h | INTRAVENOUS | Status: DC
  Administered 2019-10-26: 60 mg/h via INTRAVENOUS
  Filled 2019-10-26 (×2): qty 200

## 2019-10-26 MED ORDER — EPHEDRINE 5 MG/ML INJ
INTRAVENOUS | Status: AC
  Filled 2019-10-26: qty 10

## 2019-10-26 MED ORDER — ALBUMIN HUMAN 25 % IV SOLN: 12.5000 g | INTRAVENOUS | Status: DC

## 2019-10-26 MED ORDER — SODIUM CHLORIDE (PF) 0.9 % IJ SOLN
OROMUCOSAL | Status: DC | PRN
  Administered 2019-10-26 (×2): 4 mL via TOPICAL

## 2019-10-26 MED ORDER — ROCURONIUM BROMIDE 10 MG/ML (PF) SYRINGE
PREFILLED_SYRINGE | INTRAVENOUS | Status: AC
  Filled 2019-10-26: qty 10

## 2019-10-26 MED ORDER — 0.9 % SODIUM CHLORIDE (POUR BTL) OPTIME
TOPICAL | Status: DC | PRN
  Administered 2019-10-26: 3000 mL

## 2019-10-26 MED ORDER — CHLORHEXIDINE GLUCONATE 0.12% ORAL RINSE (MEDLINE KIT)
15.0000 mL | Freq: Two times a day (BID) | OROMUCOSAL | Status: DC
  Administered 2019-10-26 – 2019-11-10 (×30): 15 mL via OROMUCOSAL

## 2019-10-26 MED ORDER — SODIUM CHLORIDE 0.9% FLUSH: 3.0000 mL | INTRAVENOUS | Status: DC | PRN

## 2019-10-26 MED ORDER — LACTATED RINGERS IV SOLN: INTRAVENOUS | Status: DC

## 2019-10-26 MED ORDER — EPINEPHRINE 1 MG/10ML IJ SOSY
PREFILLED_SYRINGE | INTRAMUSCULAR | Status: AC
  Filled 2019-10-26: qty 10

## 2019-10-26 MED ORDER — PIVOT 1.5 CAL PO LIQD
1000.0000 mL | ORAL | Status: DC
  Filled 2019-10-26 (×2): qty 1000

## 2019-10-26 MED ORDER — FENTANYL CITRATE (PF) 250 MCG/5ML IJ SOLN
INTRAMUSCULAR | Status: AC
  Filled 2019-10-26: qty 5

## 2019-10-26 MED ORDER — ONDANSETRON HCL 4 MG/2ML IJ SOLN: 4.0000 mg | Freq: Four times a day (QID) | INTRAMUSCULAR | Status: DC | PRN

## 2019-10-26 MED ORDER — VANCOMYCIN HCL 1000 MG IV SOLR
INTRAVENOUS | Status: DC
  Filled 2019-10-26 (×2): qty 1000

## 2019-10-26 MED ORDER — DEXTROSE 50 % IV SOLN
0.0000 mL | INTRAVENOUS | Status: DC | PRN
  Administered 2019-11-05: 25 mL via INTRAVENOUS
  Filled 2019-10-26 (×3): qty 50

## 2019-10-26 MED ORDER — MIDAZOLAM HCL 2 MG/2ML IJ SOLN: 2.0000 mg | INTRAMUSCULAR | Status: DC | PRN

## 2019-10-26 MED ORDER — PHENYLEPHRINE 40 MCG/ML (10ML) SYRINGE FOR IV PUSH (FOR BLOOD PRESSURE SUPPORT)
PREFILLED_SYRINGE | INTRAVENOUS | Status: AC
  Filled 2019-10-26: qty 10

## 2019-10-26 MED ORDER — ROCURONIUM BROMIDE 10 MG/ML (PF) SYRINGE
PREFILLED_SYRINGE | INTRAVENOUS | Status: DC | PRN
  Administered 2019-10-26 (×2): 50 mg via INTRAVENOUS

## 2019-10-26 MED ORDER — AMIODARONE IV BOLUS ONLY 150 MG/100ML
150.0000 mg | Freq: Once | INTRAVENOUS | Status: DC
  Filled 2019-10-26: qty 100

## 2019-10-26 MED ORDER — FENTANYL CITRATE (PF) 250 MCG/5ML IJ SOLN
INTRAMUSCULAR | Status: DC | PRN
  Administered 2019-10-26: 50 ug via INTRAVENOUS

## 2019-10-26 MED ORDER — SODIUM CHLORIDE 0.9 % IV SOLN: 250.0000 mL | INTRAVENOUS | Status: DC

## 2019-10-26 MED ORDER — SODIUM CHLORIDE 0.9% FLUSH
10.0000 mL | Freq: Two times a day (BID) | INTRAVENOUS | Status: DC
  Administered 2019-10-26 – 2019-11-02 (×12): 10 mL
  Administered 2019-11-03: 20 mL
  Administered 2019-11-03 – 2019-11-04 (×2): 10 mL
  Administered 2019-11-04: 30 mL
  Administered 2019-11-05: 20 mL
  Administered 2019-11-05 – 2019-11-06 (×2): 10 mL
  Administered 2019-11-06: 30 mL
  Administered 2019-11-07 – 2019-11-08 (×4): 10 mL
  Administered 2019-11-09: 30 mL
  Administered 2019-11-09: 10 mL

## 2019-10-26 MED ORDER — SODIUM CHLORIDE 0.9% FLUSH: 10.0000 mL | INTRAVENOUS | Status: DC | PRN

## 2019-10-26 MED ORDER — LACTATED RINGERS IV SOLN: INTRAVENOUS | Status: DC | PRN

## 2019-10-26 MED ORDER — CHLORHEXIDINE GLUCONATE 0.12 % MT SOLN
15.0000 mL | OROMUCOSAL | Status: AC
  Administered 2019-10-26: 15 mL via OROMUCOSAL

## 2019-10-26 MED ORDER — THROMBIN 20000 UNITS EX SOLR
OROMUCOSAL | Status: DC | PRN
  Administered 2019-10-26 (×2): 4 mL via TOPICAL

## 2019-10-26 MED ORDER — MIDAZOLAM HCL 2 MG/2ML IJ SOLN
INTRAMUSCULAR | Status: AC
  Filled 2019-10-26: qty 2

## 2019-10-26 MED ORDER — INSULIN ASPART 100 UNIT/ML ~~LOC~~ SOLN
0.0000 [IU] | SUBCUTANEOUS | Status: DC
  Administered 2019-10-26 – 2019-10-29 (×7): 2 [IU] via SUBCUTANEOUS
  Administered 2019-10-30: 4 [IU] via SUBCUTANEOUS
  Administered 2019-10-30 – 2019-11-02 (×6): 2 [IU] via SUBCUTANEOUS
  Administered 2019-11-02: 8 [IU] via SUBCUTANEOUS
  Administered 2019-11-03: 2 [IU] via SUBCUTANEOUS
  Administered 2019-11-03: 4 [IU] via SUBCUTANEOUS
  Administered 2019-11-03 – 2019-11-04 (×2): 2 [IU] via SUBCUTANEOUS

## 2019-10-26 MED ORDER — MORPHINE SULFATE (PF) 2 MG/ML IV SOLN: 1.0000 mg | INTRAVENOUS | Status: DC | PRN

## 2019-10-26 MED ORDER — AMIODARONE HCL IN DEXTROSE 360-4.14 MG/200ML-% IV SOLN
30.0000 mg/h | INTRAVENOUS | Status: DC
  Administered 2019-10-27 (×3): 30 mg/h via INTRAVENOUS
  Administered 2019-10-28 – 2019-11-03 (×25): 60 mg/h via INTRAVENOUS
  Administered 2019-11-03: 30 mg/h via INTRAVENOUS
  Administered 2019-11-03 – 2019-11-04 (×2): 60 mg/h via INTRAVENOUS
  Administered 2019-11-04 – 2019-11-09 (×9): 30 mg/h via INTRAVENOUS
  Filled 2019-10-26 (×34): qty 200
  Filled 2019-10-26: qty 400
  Filled 2019-10-26 (×5): qty 200

## 2019-10-26 MED ORDER — METOPROLOL TARTRATE 5 MG/5ML IV SOLN: 2.5000 mg | INTRAVENOUS | Status: DC | PRN

## 2019-10-26 MED ORDER — VANCOMYCIN HCL 1000 MG IV SOLR
INTRAVENOUS | Status: DC | PRN
  Administered 2019-10-26: 1000 mL

## 2019-10-26 MED ORDER — LEVOTHYROXINE SODIUM 50 MCG PO TABS
50.0000 ug | ORAL_TABLET | Freq: Every day | ORAL | Status: DC
  Administered 2019-10-27 – 2019-11-08 (×13): 50 ug
  Filled 2019-10-26 (×13): qty 1

## 2019-10-26 MED ORDER — FUROSEMIDE 10 MG/ML IJ SOLN
20.0000 mg/h | INTRAVENOUS | Status: DC
  Administered 2019-10-27: 20 mg/h via INTRAVENOUS
  Administered 2019-10-27: 03:00:00 12 mg/h via INTRAVENOUS
  Administered 2019-10-28: 20 mg/h via INTRAVENOUS
  Filled 2019-10-26: qty 25
  Filled 2019-10-26: qty 21
  Filled 2019-10-26 (×2): qty 25

## 2019-10-26 MED ORDER — SODIUM CHLORIDE 0.9 % IV SOLN
100.0000 mg | Freq: Two times a day (BID) | INTRAVENOUS | Status: DC
  Administered 2019-10-26 – 2019-11-10 (×31): 100 mg via INTRAVENOUS
  Filled 2019-10-26 (×38): qty 10

## 2019-10-26 MED ORDER — LACTATED RINGERS IV SOLN: 500.0000 mL | Freq: Once | INTRAVENOUS | Status: DC | PRN

## 2019-10-26 MED ORDER — AMIODARONE IV BOLUS ONLY 150 MG/100ML
150.0000 mg | Freq: Once | INTRAVENOUS | Status: AC
  Administered 2019-10-26 (×2): 150 mg via INTRAVENOUS
  Filled 2019-10-26: qty 100

## 2019-10-26 MED ORDER — ALBUMIN HUMAN 5 % IV SOLN: INTRAVENOUS | Status: DC | PRN

## 2019-10-26 MED ORDER — THROMBIN (RECOMBINANT) 20000 UNITS EX SOLR
CUTANEOUS | Status: AC
  Filled 2019-10-26: qty 20000

## 2019-10-26 MED ORDER — MAGNESIUM SULFATE 4 GM/100ML IV SOLN
4.0000 g | Freq: Once | INTRAVENOUS | Status: AC
  Administered 2019-10-26: 4 g via INTRAVENOUS
  Filled 2019-10-26: qty 100

## 2019-10-26 MED ORDER — INSULIN REGULAR(HUMAN) IN NACL 100-0.9 UT/100ML-% IV SOLN: INTRAVENOUS | Status: DC

## 2019-10-26 MED ORDER — EPINEPHRINE HCL 5 MG/250ML IV SOLN IN NS
INTRAVENOUS | Status: DC | PRN
  Administered 2019-10-26: 2 ug/min via INTRAVENOUS

## 2019-10-26 MED ORDER — POTASSIUM CHLORIDE 10 MEQ/50ML IV SOLN: 10.0000 meq | INTRAVENOUS | Status: AC

## 2019-10-26 MED ORDER — MIDAZOLAM HCL (PF) 5 MG/ML IJ SOLN: 5.0000 mg | Freq: Once | INTRAMUSCULAR | Status: DC

## 2019-10-26 MED ORDER — ALBUMIN HUMAN 25 % IV SOLN
12.5000 g | INTRAVENOUS | Status: AC
  Administered 2019-10-26 – 2019-10-27 (×4): 12.5 g via INTRAVENOUS
  Filled 2019-10-26 (×4): qty 50

## 2019-10-26 MED FILL — Lidocaine HCl Local Preservative Free (PF) Inj 2%: INTRAMUSCULAR | Qty: 15 | Status: AC

## 2019-10-26 MED FILL — Tranexamic Acid IV Soln 1000 MG/10ML (100 MG/ML): INTRAVENOUS | Qty: 3000 | Status: AC

## 2019-10-26 MED FILL — Heparin Sodium (Porcine) Inj 1000 Unit/ML: INTRAMUSCULAR | Qty: 30 | Status: AC

## 2019-10-26 MED FILL — Potassium Chloride Inj 2 mEq/ML: INTRAVENOUS | Qty: 40 | Status: AC

## 2019-10-26 SURGICAL SUPPLY — 78 items
BAG URIMETER BARDEX IC 350 (UROLOGICAL SUPPLIES) ×1 IMPLANT
BLADE CLIPPER SURG (BLADE) ×2 IMPLANT
BLADE SURG SZ11 CARB STEEL (BLADE) ×1 IMPLANT
CANISTER SUCT 3000ML PPV (MISCELLANEOUS) ×2 IMPLANT
CANISTER WOUND CARE 500ML ATS (WOUND CARE) ×1 IMPLANT
CATH EMB 6FR 80CM (CATHETERS) ×1 IMPLANT
CATH FOLEY 2WAY SLVR  5CC 16FR (CATHETERS)
CATH FOLEY 2WAY SLVR 5CC 16FR (CATHETERS) ×1 IMPLANT
CATH THORACIC 28FR (CATHETERS) IMPLANT
CATH THORACIC 28FR RT ANG (CATHETERS) IMPLANT
CLIP VESOCCLUDE SM WIDE 24/CT (CLIP) ×1 IMPLANT
CNTNR URN SCR LID CUP LEK RST (MISCELLANEOUS) ×1 IMPLANT
CONN 3/8X3/8 GISH STERILE (MISCELLANEOUS) ×1 IMPLANT
CONT SPEC 4OZ STRL OR WHT (MISCELLANEOUS) ×2
COVER SURGICAL LIGHT HANDLE (MISCELLANEOUS) ×4 IMPLANT
DRAIN CHANNEL 32F RND 10.7 FF (WOUND CARE) ×2 IMPLANT
DRAPE CARDIOVASCULAR INCISE (DRAPES) ×2
DRAPE CV SPLIT W-CLR ANES SCRN (DRAPES) ×1 IMPLANT
DRAPE LAPAROSCOPIC ABDOMINAL (DRAPES) ×1 IMPLANT
DRAPE SRG 135X102X78XABS (DRAPES) IMPLANT
DRSG TEGADERM 4X4.75 (GAUZE/BANDAGES/DRESSINGS) ×4 IMPLANT
DRSG XEROFORM 1X8 (GAUZE/BANDAGES/DRESSINGS) ×1 IMPLANT
ELECT REM PT RETURN 9FT ADLT (ELECTROSURGICAL) ×2
ELECTRODE REM PT RTRN 9FT ADLT (ELECTROSURGICAL) ×1 IMPLANT
FELT TEFLON 1X6 (MISCELLANEOUS) ×1 IMPLANT
FIBERTAPE STERNAL CLSR 2 36IN (SUTURE) ×5 IMPLANT
FIBERTAPE STERNAL CLSR 2X36 (SUTURE) ×3 IMPLANT
GAUZE SPONGE 4X4 12PLY STRL (GAUZE/BANDAGES/DRESSINGS) ×2 IMPLANT
GAUZE XEROFORM 5X9 LF (GAUZE/BANDAGES/DRESSINGS) ×1 IMPLANT
GLOVE BIO SURGEON STRL SZ 6.5 (GLOVE) ×1 IMPLANT
GLOVE BIOGEL PI IND STRL 6 (GLOVE) IMPLANT
GLOVE BIOGEL PI IND STRL 6.5 (GLOVE) IMPLANT
GLOVE BIOGEL PI INDICATOR 6 (GLOVE) ×2
GLOVE BIOGEL PI INDICATOR 6.5 (GLOVE) ×1
GLOVE ORTHO TXT STRL SZ7.5 (GLOVE) ×5 IMPLANT
GOWN STRL REUS W/ TWL LRG LVL3 (GOWN DISPOSABLE) ×2 IMPLANT
GOWN STRL REUS W/TWL LRG LVL3 (GOWN DISPOSABLE) ×6
HEMOSTAT POWDER SURGIFOAM 1G (HEMOSTASIS) ×6 IMPLANT
IRRIGATOR SUCT 8 DISP DVNC XI (IRRIGATION / IRRIGATOR) IMPLANT
IRRIGATOR SUCTION 8MM XI DISP (IRRIGATION / IRRIGATOR)
KIT BASIN OR (CUSTOM PROCEDURE TRAY) ×2 IMPLANT
KIT PREVENA INCISION MGT20CM45 (CANNISTER) ×1 IMPLANT
KIT SUCTION CATH 14FR (SUCTIONS) ×2 IMPLANT
KIT TURNOVER KIT B (KITS) ×2 IMPLANT
LIGACLIP MED TITANIUM (CLIP) ×1 IMPLANT
NDL SUT PASSING CERCLAG MED (SUTURE) IMPLANT
NDL SUT PASSING CERCLAGE MED (SUTURE) ×2
NEEDLE SUT PASSING CERCLAG MED (SUTURE) ×1 IMPLANT
NS IRRIG 1000ML POUR BTL (IV SOLUTION) ×5 IMPLANT
PACK CHEST (CUSTOM PROCEDURE TRAY) ×2 IMPLANT
PAD ARMBOARD 7.5X6 YLW CONV (MISCELLANEOUS) ×4 IMPLANT
SET IRRIG TUBING LAPAROSCOPIC (IRRIGATION / IRRIGATOR) ×1 IMPLANT
SPONGE LAP 18X18 RF (DISPOSABLE) ×2 IMPLANT
SUT BONE WAX W31G (SUTURE) ×1 IMPLANT
SUT ETHIBOND X763 2 0 SH 1 (SUTURE) ×1 IMPLANT
SUT MNCRL AB 3-0 PS2 18 (SUTURE) ×2 IMPLANT
SUT PDS AB 1 CTX 36 (SUTURE) ×2 IMPLANT
SUT PROLENE 2 0 MH 48 (SUTURE) ×1 IMPLANT
SUT PROLENE 2 0 SH DA (SUTURE) ×3 IMPLANT
SUT PROLENE 3 0 SH DA (SUTURE) ×1 IMPLANT
SUT PROLENE 3 0 SH1 36 (SUTURE) ×1 IMPLANT
SUT PROLENE 4 0 RB 1 (SUTURE) ×2
SUT PROLENE 4 0 SH DA (SUTURE) ×1 IMPLANT
SUT PROLENE 4-0 RB1 .5 CRCL 36 (SUTURE) IMPLANT
SUT SILK  1 MH (SUTURE) ×2
SUT SILK 1 MH (SUTURE) IMPLANT
SUT SILK 2 0 SH CR/8 (SUTURE) ×2 IMPLANT
SUT VIC AB 1 CTX 27 (SUTURE) ×4 IMPLANT
SUT VIC AB 2-0 CTX 36 (SUTURE) ×6 IMPLANT
SUT VIC AB 3-0 X1 27 (SUTURE) ×4 IMPLANT
SYR 3ML LL SCALE MARK (SYRINGE) ×1 IMPLANT
SYSTEM SAHARA CHEST DRAIN ATS (WOUND CARE) ×3 IMPLANT
TAPE CLOTH SURG 4X10 WHT LF (GAUZE/BANDAGES/DRESSINGS) ×1 IMPLANT
TAPE PAPER 3X10 WHT MICROPORE (GAUZE/BANDAGES/DRESSINGS) ×1 IMPLANT
TOWEL GREEN STERILE (TOWEL DISPOSABLE) ×2 IMPLANT
TOWEL GREEN STERILE FF (TOWEL DISPOSABLE) ×2 IMPLANT
TUBE CONNECTING 12X1/4 (SUCTIONS) ×2 IMPLANT
WATER STERILE IRR 1000ML POUR (IV SOLUTION) ×2 IMPLANT

## 2019-10-26 NOTE — Op Note (Addendum)
CARDIOTHORACIC SURGERY OPERATIVE NOTE  Date of Procedure:   11/03/2019  Preoperative Diagnosis:    ECMO Support   s/p Tricuspid Valve Repair  Postoperative Diagnosis:  same  Procedure:      Mediastinal washout  Separation from ECMO support  ECMO decannulation  Delayed primary sternal closure  Surgeon:    Valentina Gu. Roxy Manns, MD  Assistant:    Miki Kins, CRNFA  Anesthesia:    Roberts Gaudy, MD  Operative Findings:   Normal left ventricular systolic function  Moderate right ventricular systolic dysfunction  Stable hemodynamics and respiratory status following separation from ECMO  Rapid atrial fibrillation following initiation of low-dose epinephrine infusion     DETAILS OF THE OPERATIVE PROCEDURE  The patient is brought to the operating room on the above mentioned date and placed in the supine position on the operating table.  General endotracheal anesthesia is monitored under the care and direction of Dr. Roberts Gaudy.  Continuous arterial line, oximetry, and central venous pressure monitoring is established.  Baseline transesophageal echocardiogram was performed by Dr. Linna Caprice.  A timeout procedure was performed.  The patient's existing wound VAC dressing is removed and the patient's anterior chest is prepared and draped in sterile manner.  The Esmarch dressing is removed.  There is a minimal amount of clot in the mediastinum.  A retractor was placed in the mediastinum is irrigated with copious warm saline solution.  Inhaled nitric oxide is begun at 30 ppm.  ECMO flows are gradually reduced and discontinued.  The patient is transfused a total of 2 units packed red blood cells while ECMO flow is weaned for volume administration.  After discontinuation of ECMO both the arterial and venous cannulae are removed uneventfully.  The patient is transfused 2 packs adult platelets and 1 unit fresh frozen plasma for severe thrombocytopenia and mild coagulopathy.  The patient is  monitored in the operating room for an additional 45 minutes after separation from ECMO and follow-up blood gas confirms normal gas exchange and oxygenation.  The sternum was closed with fiber tape cerclage.  After pulling the sternum together the patient is again monitored for more than an hour.  Central venous pressure increases slightly from low 20s to high 20s after closure of the sternum but hemodynamics remain stable.  Low-dose epinephrine infusion is begun for additional RV support but the patient developed rapid atrial fibrillation after initiation of epinephrine infusion.  The patient was cardioverted and epinephrine infusion discontinued.  The patient was started on intravenous amiodarone.  The patient suffered 1 additional episode of rapid atrial fibrillation after a second attempt to restart low-dose epinephrine.  Epinephrine is stopped and the patient rebolused with amiodarone.  Follow-up transesophageal echocardiogram demonstrates normal left ventricular systolic function with moderate right ventricular systolic dysfunction similar to original preoperative baseline.  Tricuspid valve repair remains intact with mild to moderate (1+/2+) residual tricuspid regurgitation.  CVP remains stable.  Mixed venous oxygen saturation is measured 71%.  Continuous cardiac index using Flowtrack is measured 2.6 L/min/m.  Soft tissues anterior to the sternum are closed in multiple layers and the skin incision is closed using interrupted vertical mattress 2-0 Prolene suture.  The patient is transported back to the cardiovascular intensive care unit in critical but stable condition.  There were no intraoperative complications.  All sponge instrument and needle counts are verified correct.    Valentina Gu. Roxy Manns MD 10/17/2019 12:59 PM

## 2019-10-26 NOTE — Progress Notes (Signed)
Patient back from OR on 30 nitric. VT changed to 400 to keep peak pressures <30. RR changed to 22.

## 2019-10-26 NOTE — Addendum Note (Signed)
Addendum  created 11/02/2019 1041 by Josephine Igo, CRNA   Order list changed

## 2019-10-26 NOTE — Discharge Instructions (Signed)

## 2019-10-26 NOTE — Plan of Care (Signed)
EEG reviewed. Still not completely burst suppressed as was at the start of the shift.  Recs Versed bolus 5mg  x1 Increase versed rate to 20/h BP stable.   -- Amie Portland, MD [facility] Pager: [phone] If 7pm to 7am, please call on call as listed on AMION.

## 2019-10-26 NOTE — CV Procedure (Signed)
Intraoperative Transesophageal Echocardiography Report:  [name] is a 49 year old female who is status post tricuspid valve repair who developed severe bleeding and was placed on ECMO.  She is now brought to the operating room for removal of the ECMO cannulas, chest closure, and assessment of her cardiac function.  Impression:  1.  Aortic valve: Aortic valve was trileaflet.  The leaflets opened normally without restriction.  There was no aortic insufficiency.  There was normal leaflet thickness.  2.  Mitral valve: There waas normal leaflet thickness. The leaflets opened normally without restriction.  There was normal coaptation of the leaflets and there was trace mitral insufficiency.  3.  Left ventricle: There was normal LV wall thickness.  The left ventricular size was normal.  The left ventricular systolic function was hyperdynamic and the ejection fraction was estimated at 65 to 70%.  The interventricular septum was flattened in systole and diastole consistent with RV pressure and volume overload  4.  Tricuspid valve: There is an annuloplasty ring in the tricuspid position.  There was moderate tricuspid insufficiency.  There was no evidence of tricuspid stenosis.  The mean trans-tricuspid diastolic gradient was 2 mmHg.   5.  Right atrium: Right atrial cavity was markedly enlarged and measured 4.3 cm in the mediolateral dimension.  There initially was an ECMO cannula noted within the right atrium this was removed during the procedure.  6.  Interatrial septum: The interatrial septum was bowed from left to right consistent with elevated right atrial pressure.  There was no evidence of patent foramen ovale or interatrial shunt by color Doppler.  7. Right ventricle: The right ventricular cavity was markedly enlarged.  There was moderate right ventricular systolic dysfunction.  The interventricular septum was flattened in systole and diastole consistent with right ventricular pressure and  volume overload.  Following chest closure, the right ventricular filling appeared unchanged.  8.  Left atrium: Left atrial cavity was normal in size measuring 3.3 cm in the medial lateral dimension.  9.  Ascending aorta: The ascending aorta was of normal diameter there was normal wall thickness. There was a normal appearing aortic root and sinotubular junction without dilatation or effacement.  Initially,  there was turbulent flow within the ascending aorta.  Following removal of the aortic cannula no further turbulent flow was appreciated.  10.  Pulmonic valve: There was trace pulmonic insufficiency.  The leaflets appeared to open normally.  Roberts Gaudy, MD

## 2019-10-26 NOTE — Progress Notes (Signed)
vLTM  maintenanced.  Repaired CZ, C4, PZ  No skin breakdown at sites.   Event button tested in room

## 2019-10-26 NOTE — OR Nursing (Signed)
1111 6 keepers and one 50 ml syring without plunger removed from pt's chest by Dr. Roxy Manns.

## 2019-10-26 NOTE — Progress Notes (Signed)
  Echocardiogram Echocardiogram Transesophageal has been performed.  [name] 10/29/2019, 10:43 AM

## 2019-10-26 NOTE — OR Nursing (Signed)
1101 Patient taken off ECMO by Dr. Roxy Manns.

## 2019-10-26 NOTE — Progress Notes (Signed)
Subjective: Continues to be on Versed and propofol drip.  Was taken back to the OR today for ECMO decannulation  ROS: Unable to obtain due to poor mental status  Examination  Vital signs in last 24 hours: Temp:  [97.9 F (36.6 C)-98.6 F (37 C)] 98.4 F (36.9 C) (04/12 1000) Pulse Rate:  [69-83] 83 (04/12 0305) Resp:  [0-24] 0 (04/12 0815) BP: (93)/(62) 93/62 (04/12 0026) SpO2:  [90 %-100 %] 100 % (04/12 1000) Arterial Line BP: (80-122)/(55-77) 122/72 (04/12 1000) FiO2 (%):  [40 %-100 %] 100 % (04/12 1328) Weight:  [93.5 kg] 93.5 kg (04/12 0545)  General: lying in bed, not in apparent distress CVS: pulse-normal rate, irregular rhythm RS: breathing comfortably, intubated Extremities: Edematous, cold  Neuro: MS: Comatose, does not open eyes to noxious stimuli CN: pupils equal, difficult to appreciate any reactivity, corneal reflex absent, gag reflex absent  Motor: Does not withdraw to noxious stimuli in all 4 extremities, flaccid  Basic Metabolic Panel: Recent Labs  Lab 11/09/2019 0334 11/12/2019 0636 11/03/2019 1632 10/29/2019 1633 11/09/2019 1752 11/09/2019 1803 10/24/19 0356 10/24/19 0407 10/24/19 1708 10/24/19 1716 10/25/19 0510 10/25/19 0519 10/25/19 1706 10/25/19 1715 10/18/2019 0406 10/24/2019 0613 10/25/2019 1047 11/02/2019 1127 10/24/2019 1145 11/02/2019 1228 10/29/2019 1240  NA 149*   < > 152*   < > 151*   < > 150*   < > 147*   < > 145   < > 145   < > 143   < > 143 143 143 142 142  K 4.5   < > 3.7   < > 3.9   < > 4.0   < > 3.8   < > 3.9   < > 3.7   < > 4.1   < > 4.1 4.1 4.1 3.8 3.9  CL 118*   < > 120*   < > 117*   < > 115*  --  113*  --  109  --  107  --  104  --   --   --   --   --   --   CO2 17*   < > 23   < > 25   < > 25  --  25  --  26  --  28  --  28  --   --   --   --   --   --   GLUCOSE 168*   < > 126*   < > 68*   < > 86  --  66*  --  87  --  93  --  235*  --   --   --   --   --   --   BUN 13   < > 15   < > 28*   < > 31*  --  34*  --  34*  --  35*  --  40*  --   --   --    --   --   --   CREATININE 1.16*   < > 1.36*   < > 2.25*   < > 2.49*  --  2.55*  --  2.57*  --  2.30*  --  2.48*  --   --   --   --   --   --   CALCIUM 5.6*   < > 6.3*   < > 7.3*   < > 7.7*   < > 8.2*   < > 8.5*  --  8.6*  --  8.5*  --   --   --   --   --   --   MG 1.3*  --  1.6*  --  2.2  --  2.1  --   --   --   --   --   --   --   --   --   --   --   --   --   --   PHOS  --   --   --   --   --   --  6.6*  --   --   --   --   --   --   --   --   --   --   --   --   --   --    < > = values in this interval not displayed.    CBC: Recent Labs  Lab 10/16/2019 1749 11/11/2019 2028 10/24/19 1708 10/24/19 1716 10/25/19 0510 10/25/19 0519 10/25/19 1706 10/25/19 1715 10/16/2019 0406 11/11/2019 0613 11/03/2019 1127 10/25/2019 1145 11/03/2019 1228 11/13/2019 1240 10/20/2019 1348  WBC 3.0*   < > 5.2  --  4.2  --  3.9*  --  4.2  --   --   --   --   --  6.0  NEUTROABS 2.5  --   --   --   --   --   --   --   --   --   --   --   --   --   --   HGB 7.7*   < > 9.2*   < > 8.7*   < > 9.5*   < > 9.3*   < > 11.2* 11.2* 9.9* 10.9* 10.8*  HCT 23.3*   < > 26.9*   < > 25.6*   < > 28.8*   < > 27.8*   < > 33.0* 33.0* 29.0* 32.0* 32.1*  MCV 86.6   < > 87.1  --  88.9  --  89.2  --  90.0  --   --   --   --   --  89.7  PLT 77*   < > 47*  --  36*  --  30*  --  33*  --   --   --   --   --  126*   < > = values in this interval not displayed.     Coagulation Studies: Recent Labs    10/18/2019 1752 10/24/19 0356 10/25/19 0510 10/17/2019 0406 10/31/2019 1348  LABPROT 17.1* 16.0* 15.1 13.7 14.2  INR 1.4* 1.3* 1.2 1.1 1.1    Imaging CT head without contrast 10/24/2019: No acute intracranial abnormality.   ASSESSMENT AND PLAN: 49 year old female with SLE, tricuspid valve repair followed by hemorrhagic shock was on ECMO and was noted to have facial twitching movements on 10/22/2019.,  EEG showed focal convulsive status epilepticus.  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 As AED were titrated, eeg improved and showedsevere diffuse encephalopathy, non specific to etiology.No definite seizures were seen. - No cranial nerves on exam albeit patient on sedation. Given status epilepticus and concern for hypoxic brain injury, will need MRI and neuro exam off sedation for prognostication  Recommendations - Continue Keppra 1000mg  BID and Vimpat 100mg  BID - Continue Versed@ 18ml/hr and Propofol at current rate - Can uptitrate versed/propofol  if needed to maintain burst suppression.  - Continue LTM EEG to help titration of sedation for seizure management - Plan to wean off sedation tomorrow if EEG continues to be in burst suppression for 24 hours - MRI brain wo contrast to assess for hypoxic/anoxic brain injury after LTM EEG is discontinued - continue seizure precautions - PRN IV versed 5mg  for clinical seizure like activity  CRITICAL CARE Performed by: Lora Havens  Total critical care time: 35 minutes  Critical care time was exclusive of separately billable procedures and treating other patients.  Critical care was necessary to treat or prevent imminent or life-threatening deterioration.  Critical care was time spent personally by me on the following activities: development of treatment plan with patient and/or surrogate as well as nursing, discussions with consultants, evaluation of patient's response to treatment, examination of patient, obtaining history from patient or surrogate, ordering and performing treatments and interventions, ordering and review of laboratory studies, ordering and review of radiographic studies, pulse oximetry and re-evaluation of patient's condition.

## 2019-10-26 NOTE — Progress Notes (Signed)
Patient was successfully decannulated from ECMO.

## 2019-10-26 NOTE — Progress Notes (Signed)
Patient returned from the OR at 1315. Verified several orders with Dr. Roxy Manns.  - Ok to leave patient on q4h CBG's and SSI instead of restarting insulin drip. Ok to restart insulin drip per TCTS protocol is CBGs become elevated. - Ok to hold TF until tomorrow when it will be reevaluated. Patient with >800 mls residual suctioned from OGT when patient arrived to the OR. - Infuse magnesium 4g per TCTS protocol.   Joellen Jersey, RN

## 2019-10-26 NOTE — Progress Notes (Signed)
[facility] for heparin Indication: ECMO  Allergies  Allergen Reactions  . Lisinopril     angioedema  . Penicillins Hives    DID THE REACTION INVOLVE: Swelling of the face/tongue/throat, SOB, or low BP? Unknown Sudden or severe rash/hives, skin peeling, or the inside of the mouth or nose? Unknown Did it require medical treatment? Unknown When did it last happen?age 48 If all above answers are "NO", may proceed with cephalosporin use.     Patient Measurements: Height: '5\' 7"'$  (170.2 cm) Weight: 93.5 kg (206 lb 2.1 oz) IBW/kg (Calculated) : 61.6 Heparin Dosing Weight: 71 kg  Vital Signs: Temp: 98.6 F (37 C) (04/12 0700) BP: 93/62 (04/12 0026) Pulse Rate: 83 (04/12 0305)  Labs: Recent Labs    10/24/19 0356 10/24/19 0407 10/25/19 0510 10/25/19 0519 10/25/19 1706 10/25/19 1715 10/25/19 2151 10/25/19 2155 10/25/19 2155 11/03/2019 0406 10/16/2019 0613  HGB 10.1*   < > 8.7*   < > 9.5*   < >  --  8.2*   < > 9.3* 8.2*  HCT 29.3*   < > 25.6*   < > 28.8*   < >  --  24.0*  --  27.8* 24.0*  PLT 68*   < > 36*  --  30*  --   --   --   --  33*  --   APTT 39*   < > 66*   < > 65*  --  60*  --   --  63*  --   LABPROT 16.0*  --  15.1  --   --   --   --   --   --  13.7  --   INR 1.3*  --  1.2  --   --   --   --   --   --  1.1  --   HEPARINUNFRC  --    < > <0.10*  --  <0.10*  --   --   --   --  <0.10*  --   CREATININE 2.49*   < > 2.57*  --  2.30*  --   --   --   --  2.48*  --    < > = values in this interval not displayed.    Estimated Creatinine Clearance: 33.3 mL/min (A) (by C-G formula based on SCr of 2.48 mg/dL (H)).   Medical History: Past Medical History:  Diagnosis Date  . Avascular necrosis ([place])   . Diabetes mellitus without complication ([place]) 50/9326  . Hypertension   . Hypothyroidism 01/2019  . Lupus ([place])   . Lupus nephritis ([place])   . Prediabetes 10/2011  . Pulmonary hypertension ([place])   . 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 Tricuspid regurgitation   . 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 chlorhexidine gluconate (MEDLINE KIT)  15 mL Mouth Rinse BID  . Chlorhexidine Gluconate Cloth  6 each Topical Daily  . free water  200 mL Per Tube Q4H  . insulin aspart  0-24 Units Subcutaneous Q4H  . [START ON 10/27/2019] levothyroxine  50 mcg Per Tube Q0600  . mouth rinse  15 mL Mouth Rinse 10 times per day  . pantoprazole (PROTONIX) IV  40 mg Intravenous QHS  . senna  1 tablet  Oral Daily  . sodium chloride flush  10-40 mL Intracatheter Q12H  . sodium chloride flush  3 mL Intravenous Q12H   Infusions:  . sodium chloride Stopped (10/20/2019 1900)  . sodium chloride 10 mL/hr at 10/30/2019 0600  . sodium chloride 20 mL/hr at 10/24/19 0400  . albumin human 60 mL/hr at 10/30/2019 0530  . albumin human Stopped (11/01/2019 1900)  . dexmedetomidine (PRECEDEX) IV infusion Stopped (10/25/19 2201)  . dextrose 5 % and 0.45% NaCl Stopped (10/16/2019 1900)  . epinephrine Stopped (10/30/2019 0505)  . feeding supplement (PIVOT 1.5 CAL) 65 mL/hr at 10/27/2019 0010  . furosemide (LASIX) infusion 8 mg/hr (11/04/2019 0639)  . heparin 500 Units/hr (11/06/2019 0603)  . insulin Stopped (11/01/2019 1458)  . lacosamide (VIMPAT) IV Stopped (10/27/2019 0232)  . lactated ringers    . lactated ringers    . lactated ringers 100 mL/hr at 10/29/2019 0558  . levETIRAcetam Stopped (10/25/2019 0031)  . meropenem (MERREM) IV Stopped (10/25/19 2335)  . midazolam 20 mg/hr (11/07/2019 0600)  . milrinone 0.5 mcg/kg/min (11/09/2019 0600)  . niCARDipine Stopped (10/25/19 0324)  . nitroGLYCERIN    . norepinephrine (LEVOPHED) Adult infusion 2 mcg/min (11/03/2019 0600)  . phenylephrine (NEO-SYNEPHRINE) Adult infusion Stopped  (11/09/2019 2236)  . propofol (DIPRIVAN) infusion 40 mcg/kg/min (10/15/2019 8270)  . vancomycin      Assessment: 49 y.o. female admitted on 10/17/2019 with TV repair. Postop course has been complicated by hemorrhagic shock and [facility] ECMO was initiated later on 4/7. Back to OR for re-exploration on 4/8 and 4/9 for washout. Patient remains with open chest.   After discussion with ECMO team, pharmacy has been consulted to initiate low dose heparin for ECMO circuit - no bolus.  Heparin level remains undetectable, on 500 units/hr. APTT is 63. Hgb 9.3, plt down to 33- likely from acute illness and with history of thrombocytopenia. No s/sx of bleeding. Flows continue stable/improving and fibrin in circuit stable. No infusion issues.   Goal of Therapy:  Heparin level 0.3-0.7 units/ml - will hold off targeting heparin level goals for now given recent hemorrhagic shock. Trickle heparin to keep circuit from clotting for now. Reassess with ECMO team daily to assess for dosing adjustments Monitor platelets by anticoagulation protocol: Yes   Plan:  Heparin d/c'd since now off ECMO, pharmacy will sign off.  Marguerite Olea, [facility] Clinical Pharmacist Phone [phone]  11/05/2019 7:27 AM

## 2019-10-26 NOTE — Progress Notes (Signed)
Patient ID: [name], female   DOB: 1970-10-09, 49 y.o.   MRN: [id number]     Advanced Heart Failure Rounding Note  PCP-Cardiologist: No primary care provider on file.   Subjective:    Events: - 4/7 Underwent mini TV repair c/b diffuse coagulopathy and severe bleeding.  Received RBCS, PLTs, TXA, cryo, Factor 7, steroids - 4/7 take back to OR for bleeding and VA ECMO placement  - 4/8 Repeat take back to OR for washout - 4/9 Back to OR for right chest hematoma removal. TEE in OR showed EF 40-45%, mild-moderately decreased RV systolic function, s/p TV repair with mild TR.  - 4/10 EEG with status epilepticus  Remains on ECMO and vent. On milrinone 0.5, NE 2, off NO. Stable hemodynamically.  Very good UOP on Lasix gtt 8 mg/hr but I/Os even.  Creatinine 2.49 => 2.55 => 2.48. CXR with right-sided opacity.   She remains on vancomycin/meropenem.   Low dose heparin ongoing, some fibrin noted in circuit.   Hgb 9.3 this am. PLTs 68 => 36 => 33.  LDH 581 => 505 => 436.  1 units PRBCs yesterday.   4/11 noted to have constant jerking/twitching. EEG done showing convulsive status epilepticus.  Neuro following, now on Keppra and Vimpat as well as Propofol and Versed.  Twitching on exam has resolved but still have bursts on EEG. CT head was negative.   Vent at 40% PEEP 5. ABG 7.41/48/266  ECMO parameters: 3300 rpm Flow 3.7 L/min Sweep 1 Neg pressure -21 Delta P 18  Objective:   Weight Range: 93.5 kg Body mass index is 32.28 kg/m.   Vital Signs:   Temp:  [97.9 F (36.6 C)-98.6 F (37 C)] 98.6 F (37 C) (04/12 0700) Pulse Rate:  [67-83] 83 (04/12 0305) Resp:  [0-24] 0 (04/12 0700) BP: (93-106)/(62-71) 93/62 (04/12 0026) SpO2:  [90 %-100 %] 100 % (04/12 0700) Arterial Line BP: (80-127)/(55-82) 112/66 (04/12 0645) FiO2 (%):  [40 %] 40 % (04/12 0305) Weight:  [93.5 kg] 93.5 kg (04/12 0545) Last BM Date: (UTA)  Weight change: Filed Weights   10/24/19 0500 10/25/19 0645  10/15/2019 0545  Weight: 96.3 kg 92.4 kg 93.5 kg    Intake/Output:   Intake/Output Summary (Last 24 hours) at 10/24/2019 0740 Last data filed at 10/28/2019 0600 Gross per 24 hour  Intake 6906.81 ml  Output 6330 ml  Net 576.81 ml      Physical Exam    General: Sedated on vent.  Neck: JVP 10 cm, no thyromegaly or thyroid nodule.  Lungs: Decreased on right CV: Chest open Abdomen: Soft, nontender, no hepatosplenomegaly, no distention.  Skin: Intact without lesions or rashes.  Neurologic: Sedated on vent Extremities: No clubbing or cyanosis.  HEENT: Normal.    Telemetry   NSR 70s, personally reviewed   Labs    CBC Recent Labs    10/25/19 1706 10/25/19 1715 11/05/2019 0406 11/13/2019 0613  WBC 3.9*  --  4.2  --   HGB 9.5*   < > 9.3* 8.2*  HCT 28.8*   < > 27.8* 24.0*  MCV 89.2  --  90.0  --   PLT 30*  --  33*  --    < > = values in this interval not displayed.   Basic Metabolic Panel Recent Labs    11/03/2019 1752 10/31/2019 1803 10/24/19 0356 10/24/19 0407 10/25/19 1706 10/25/19 1715 10/31/2019 0406 11/08/2019 0613  NA 151*   < > 150*   < > 145   < >  143 141  K 3.9   < > 4.0   < > 3.7   < > 4.1 3.8  CL 117*   < > 115*   < > 107  --  104  --   CO2 25   < > 25   < > 28  --  28  --   GLUCOSE 68*   < > 86   < > 93  --  235*  --   BUN 28*   < > 31*   < > 35*  --  40*  --   CREATININE 2.25*   < > 2.49*   < > 2.30*  --  2.48*  --   CALCIUM 7.3*   < > 7.7*   < > 8.6*  --  8.5*  --   MG 2.2  --  2.1  --   --   --   --   --   PHOS  --   --  6.6*  --   --   --   --   --    < > = values in this interval not displayed.   Liver Function Tests Recent Labs    10/25/19 0510 11/09/2019 0406  AST 90* 60*  ALT 76* 58*  ALKPHOS 58 94  BILITOT 2.2* 1.6*  PROT 4.3* 4.3*  ALBUMIN 2.4* 2.6*   No results for input(s): LIPASE, AMYLASE in the last 72 hours. Cardiac Enzymes No results for input(s): CKTOTAL, CKMB, CKMBINDEX, TROPONINI in the last 72 hours.  BNP: BNP (last 3  results) Recent Labs    04/15/19 1101  BNP 248.7*    ProBNP (last 3 results) No results for input(s): PROBNP in the last 8760 hours.   D-Dimer No results for input(s): DDIMER in the last 72 hours. Hemoglobin A1C No results for input(s): HGBA1C in the last 72 hours. Fasting Lipid Panel Recent Labs    10/24/19 1708  TRIG 31   Thyroid Function Tests No results for input(s): TSH, T4TOTAL, T3FREE, THYROIDAB in the last 72 hours.  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 Neurology consulted for right facial twitching noted. EEG to evaluate for seizure  Level of alertness: comatose  AEDs during EEG study: keppra, vimpat, propofol  Technical aspects: This EEG study was done with scalp electrodes positioned according to the 10-20 International system of electrode placement. Electrical activity was acquired at a sampling rate of '500Hz'$  and reviewed with a high frequency filter of '70Hz'$  and a low frequency filter of '1Hz'$ . EEG data were recorded continuously and digitally stored.  DESCRIPTION:  At the beginning, eeg showed periodic epileptiform discharges at 2.5-'3Hz'$  in left frontocentral region. After around 1230 on 10/24/2019, periodic epileptiform discharges appeared at 1.5-'2Hz'$ .  After around 1330, eeg continued to improve and showed intermittent sharp waves in left frontocentral region. Again after 1800, eeg showed periodic epileptiform discharges at 1.5-'2hz'$ .As AEDs were  titrated, EEG gradually showed intermittent generalized low amplitude 2-'5hz'$  theta-delta slowing for 2-3 seconds alternating with high amplitude sharply contoured 2-'5hz'$  theta-delta slowing  as well as sharp waves in left frontocentral region. Around 0700 on 10/25/2019. eeg again showed generalized periodic epileptiform discharges with overriding fast activity at 2-2.'5hz'$ . Hyperventilation and photic stimulation were not performed.  ABNORMALITY - Non-Convulsive status epilepticus, Left frontocentral region - GPEDs+, left frontocentral region - Intermittent slow, generalized  IMPRESSION: This study initially showed evidence of focal non-convulsive status epilepticus arising from left frontocentral region. As AED were titrated, eeg improved and showed severe diffuse encephalopathy, non specific to etiology. However, since around 7am on 10/25/2019, eeg appears to be worsening again with generalized periodic epileptiform discharges with overriding fast activity at 2-2.'5Hz'$  consistent with ictal-interictal continuum. Dr Aroor was intermittently notified. Priyanka Barbra Sarks      Medications:     Scheduled Medications: . chlorhexidine gluconate (MEDLINE KIT)  15 mL Mouth Rinse BID  . Chlorhexidine Gluconate Cloth  6 each Topical Daily  . free water  200 mL Per Tube Q4H  . insulin aspart  0-24 Units Subcutaneous Q4H  . [START ON 10/27/2019] levothyroxine  50 mcg Per Tube Q0600  . mouth rinse  15 mL Mouth Rinse 10 times per day  . pantoprazole (PROTONIX) IV  40 mg Intravenous QHS  . senna  1 tablet Oral Daily  . sodium chloride flush  10-40 mL Intracatheter Q12H  . sodium chloride flush  3 mL Intravenous Q12H    Infusions: . sodium chloride Stopped (10/31/2019 1900)  . sodium chloride 10 mL/hr at 10/31/2019 0600  . sodium chloride 20 mL/hr at 10/24/19 0400  . albumin human 60 mL/hr at 10/25/2019 0530  . albumin human Stopped (10/29/2019 1900)  . dexmedetomidine (PRECEDEX) IV infusion Stopped (10/25/19 2201)  .  dextrose 5 % and 0.45% NaCl Stopped (10/31/2019 1900)  . epinephrine Stopped (10/30/2019 0505)  . feeding supplement (PIVOT 1.5 CAL) 65 mL/hr at 11/01/2019 0010  . furosemide (LASIX) infusion 8 mg/hr (11/12/2019 0639)  . heparin 500 Units/hr (11/04/2019 0603)  . insulin Stopped (11/01/2019 1458)  . lacosamide (VIMPAT) IV Stopped (10/28/2019 0232)  . lactated ringers    . lactated ringers    . lactated ringers 100 mL/hr at 10/30/2019 0558  . levETIRAcetam Stopped (11/05/2019 0031)  . meropenem (MERREM) IV Stopped (10/25/19 2335)  . midazolam 20 mg/hr (10/30/2019 0600)  . milrinone 0.5 mcg/kg/min (10/25/2019 0600)  . niCARDipine Stopped (10/25/19 0324)  . nitroGLYCERIN    . norepinephrine (LEVOPHED) Adult infusion 2 mcg/min (11/01/2019 0600)  . phenylephrine (NEO-SYNEPHRINE) Adult infusion Stopped (10/20/2019 2236)  . propofol (DIPRIVAN) infusion 40 mcg/kg/min (11/02/2019 2637)  . vancomycin      PRN Medications: sodium chloride, sodium chloride, albumin human, artificial tears, dextrose, lactated ringers, metoprolol tartrate, midazolam, morphine injection, ondansetron (ZOFRAN) IV, oxyCODONE, sodium chloride flush, sodium chloride flush, traMADol     Assessment/Plan   1. Post-cardiotomy hemorrhagic shock - Patient with refractory post-operative chest bleeding s/p minimally invasive TV repair on 10/17/2019 in setting of longstanding SLE - has received  RBCS, PLTs, TXA, cryo, Factor 7, steroids - now on ECMO support - Repeat OR visit for right chest clot removal on 4/9 with improved hemodynamics, TEE with EF 40-45%, mild-moderately decreased RV function, stable replaced TV.  - Bleeding has slowed even with platelets down to 33.  - Continue to replace products as needed, 1 unit PRBCs yesterday.  - ECMO flow stable, on low dose heparin.  Sweep down to 1.  - Creatinine stable, weight still up significantly.  Will increase Lasix to 12 mg/hr.  - To OR for decannulation +/-  chest closure today.   2. Severe TR - s/p  mini-TV repair 4/7, stable repaired valve on 4/9 TEE.  - plan as above  3. Post-operative respiratory failure - remains intubated, FiO2 0.4.   - Progressive airspace disease in R lung after mini thoracotomy, back to OR 4/9 for right chest clot removal.  - CCM following vent. - She is on vancomycin/cefepime, PCT 4.09.   4. Acute blood loss anemia - as above - continue to replace as needed  5. Thrombocytopenia - Due to shock/coaguloapthy - Platelets lower today at 33K. LDH lower with heparin gtt, do not think she is hemolyzing more extensively in the ECMO circuit.  Think HIT unlikely.  Suspect platelets have yet to recover from initial insult.  - Continue heparin for now, will transfuse plts if drop < 20K.   6. Convulsive status epilepticus - Noted on 4/10 EEG.  - She is now on Vimpat and Keppra.  - Propofol to 30 and Versed begun.  - Twitching on exam has resolved but still bursts on EEG.   - Concern for hypoxic injury.  - Neuro following.   7. SLE - no change  8. Acute on chronic systolic CHF with RV failure - Marked total body fluid overload.  Lasix gtt increase to 12 mg/hr today.     9. AKI - Creatinine 2.25 => 2.49 => 2.57 => 2.48, follow closely.  CRITICAL CARE Performed by: Loralie Champagne  Total critical care time: 45 minutes  Critical care time was exclusive of separately billable procedures and treating other patients.  Critical care was necessary to treat or prevent imminent or life-threatening deterioration.  Critical care was time spent personally by me (independent of midlevel providers or residents) on the following activities: development of treatment plan with patient and/or surrogate as well as nursing, discussions with consultants, evaluation of patient's response to treatment, examination of patient, obtaining history from patient or surrogate, ordering and performing treatments and interventions, ordering and review of laboratory studies, ordering and  review of radiographic studies, pulse oximetry and re-evaluation of patient's condition.    Length of Stay: [place], MD  11/02/2019, 7:40 AM  Advanced Heart Failure Team Pager [phone] (M-F; [place])  Please contact [facility] for night-coverage after hours (4p -7a ) and weekends on [email or web address]

## 2019-10-26 NOTE — Plan of Care (Signed)
EEG reviewed.  Change from an adequate burst suppression pattern to more intermittent slowing. Not adequately burst suppressed upon total review of EEG done about 3:45 PM.  Recommendation: Go up on the Versed-increase to 15 mg/h.  Further recommendations based on response to Versed and morning rounds.  -- Amie Portland, MD [facility] Pager: [phone] If 7pm to 7am, please call on call as listed on AMION.

## 2019-10-26 NOTE — Progress Notes (Signed)
Pt going to OR. Headbox disconnected.

## 2019-10-26 NOTE — Progress Notes (Addendum)
Seen and examined in f/u for multifactorial shock after valve replacement.  Remains on VA ECMO. Versed and propofol titrating up to achieve full burst suppression.  Obtunded on vent, chest tubes in place, CXR with stable R>L airspace disease.  ABG showing good gas exchange on minimal sweep. Net even, 6L Uop LDH improved Plts stable low Kidney function stable H/H okay  On very low dose heparin  Mild stable fibrin in oxygenator  A: -Multifactorial (hemorrhagic, cardiac) shock after TV replacement. -Underlying PAH by echo but not by RHC numbers -Immune mediated thrombocytopenia baseline, superimposed issues suspect related to bleeding and hemolysis on circuit; stable - Status epilepticus post op, attempting to achieve burst-suppression - Abnormal PFTs, air trapping, fixed moderate obstruction; CT not impressive for ILD - Mediastinal hemorrhage and hemothorax post washout -Hypoglycemia improved  P - Vent parameters look great, nothing really to change here, could put sweep to 0 whenever - OR today for washout and potential decannulation - Seizure suppression per neurology, probably needs MRI at some point - Diuretics per TCTS and HF teams - Transfusion thresholds and AC per TCTS  31 minutes critical care time Erskine Emery MD PCCM     NAME:  [name], MRN:  [id number], DOB:  1970/12/14, LOS: 5 ADMISSION DATE:  10/20/2019, CONSULTATION DATE:  10/16/2019  REFERRING MD:  Rexene Alberts, MD, CHIEF COMPLAINT:  Vent management.  History of present illness   The patient is a 49 year old woman who is postoperative day 3 from minimally invasive tricuspid valve repair which was then converted into an open procedure.  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  However despite echocardiograms showing severely elevated RA pressures, she has had 2 right heart cath that demonstrated normal peripheral vascular resistance for Group 1 pulmonary arterial hypertension.  She was taken to the OR by Dr. Roxy Manns on 4/7 given her persistent symptoms for repair of severe tricuspid regurgitation.  4/8, she had reexploration of her median sternotomy with concern for postoperative bleeding.  There was a large amount of clot evacuated from the anterior mediastinum and significant oozing diffusely.  Does not appear that there were any significant signs for mechanical active bleeding.  Bili at that time she was cannulated for ECMO and started on [place] ECMO on 4/8.  Since then she has had substantial correction of her coagulopathy including Kcentra, FEIBA, cryoprecipitate, FFP and copious blood products given her substantial bleeding in the mediastinum.  She has also been started on inhaled nitric oxide for her component of right ventricular heart failure as well as  PCCM is being consulted to assist with vent management.  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   Consults:  Heart failure, PCCM  Procedures:  4/7 tricuspid valve repair through median sternotomy 4/8 reexploration of the mediastinum with cannulation for ECMO  Significant Diagnostic Tests:  Transesophageal echocardiogram January 2021  1. Left ventricular ejection fraction, by visual estimation, is 60 to  65%. The left ventricle has normal function. There is no left ventricular  hypertrophy.  2. The left ventricle has no  regional wall motion abnormalities.  3. Global right ventricle has mildly reduced systolic function.The right  ventricular size is normal. No increase in right ventricular wall  thickness.  4. Left atrial size was moderately dilated.  5. Right atrial size was severely dilated.  6. The mitral valve is normal in structure. Trivial mitral valve  regurgitation.  7. The tricuspid valve is abnormal.  8. The tricuspid valve is abnormal. Tricuspid valve regurgitation is  severe.  9. Apparent mild restriction of septal leaflet. RVSP ~ 83mmHG.  10. The aortic valve is normal in structure. Aortic valve regurgitation is  not visualized.  11. The pulmonic valve was grossly normal. Pulmonic valve regurgitation is  not visualized.  12. Mild plaque invoving the descending aorta.  13. Severely elevated pulmonary artery systolic pressure.   Right heart catheterization June 2020 Findings:  RA = 5 RV = 34/7 PA =  34/6 (20) PCW = 8 Fick cardiac output/index = 5.9/3.1 PVR = 2.0 WU Ao sat = 100% PA sat = 71%, 72% SVC sat = 66%  Assessment: 1. Minimally elevated PA pressures with normal PVR and cardiac output 2. No evidence of significant intracardiac shunt  Micro Data:  None  Antimicrobials:  Levofloxacin 4/7 >> 4/8 Vancomycin 4/7 >> current Meropenem 4/9 >> current   Subjective:  Burst suppression per neurology has been difficult. Currently on propofol 40 mcg and versed 15 mg/hr.  CBGs improving. D10 stopped.  Net positive over last 24 hours.  Remains on milrinone 0.5 mcg/hr, lasix 8mg /hr, and levo 77mcg/hr.   Objective   Blood pressure 93/62, pulse 83, temperature 98.4 F (36.9 C), resp. rate (!) 22, height 5\' 7"  (1.702 m), weight 93.5 kg, last menstrual period 04/02/2017, SpO2 100 %. CVP:  [20 mmHg-82 mmHg] 82 mmHg  Vent Mode: PRVC FiO2 (%):  [40 %] 40 % Set Rate:  [12 bmp] 12 bmp Vt Set:  [370 mL] 370 mL PEEP:  [5 cmH20] 5 cmH20 Pressure Support:  [10 cmH20] 10 cmH20  Plateau Pressure:  [16 cmH20-22 cmH20] 22 cmH20   Intake/Output Summary (Last 24 hours) at 11/03/2019 0626 Last data filed at 11/11/2019 0600 Gross per 24 hour  Intake 7003.8 ml  Output 6605 ml  Net 398.8 ml   Filed Weights   10/24/19 0500 10/25/19 0645 11/07/2019 0545  Weight: 96.3 kg 92.4 kg 93.5 kg   Examination:  GEN: Obese female, sedated  HEENT: ETT in place, minimal secretions CV: Open sternotomy with cannulas in place, ext warm PULM: Diminished bases, passive on vent GI: Soft, hypoactive BS EXT: Global anasarca NEURO: Sedated  ABG 7.41/48/266 on sweep 1LPM Net +17.2L since admission, +0.6L over last 24 hours on Furosemide gtt 8mg /hr Renal function stable with improvement in AST/ALT CBGs elevated now Fibrinogen 484 >> 552 LDH 449>>581>>505 >>436 Resolved Hospital Problem list   None  Assessment & Plan:  [name] is a 49 y.o. woman with history of SLE with thrombocytopenia, nephritis, AVN and possible mild chronic RV failure who presented for repair of severe symptomatic tricuspid regurgitation on 4/7.    Acute hypoxemic respiratory failure - Continue vent support - Maintain pH 7.35-7.4 - Usual VAP prevention measures  Cardiogenic Shock - V/A ecmo since 4/8. Plan to hopefully decannulate this PM - Renal function stable. AST/ALT down trending  - Net +17.2L since admission, +0.6L over last 24 hours on Furosemide gtt 8mg /hr - NIO is off - On milrinone 0.5 mcg/hr and levo 2 mcg/hr  - Diuresis, AC, and inotropes per  CHF/TCTS teams  SLE with immune mediated thrombocytopenia and coagulopathy - Improved, minimal from chest over the last 24 hours. No other clinical evidence of bleeding.  - CBC this AM  - Monitor with AC resumption  Acute encephalopathy Status Epilepticus  - Burst suppression per neurology has been difficult. Currently on propofol 40 mcg and versed 15 mg/hr.  - On Vimpat and Keppra per neurology.   Best practice:  Diet: TF with free water  Pain/Anxiety/Delirium protocol (if indicated): Propofol and versed VAP protocol (if indicated): Ordered DVT prophylaxis: heparin gtt GI prophylaxis: Pantoprazole Glucose control: Controlled, blood sugars less than 180 Foley required for critically ill state Mobility: Bedrest Code Status: Full code Family Communication: Per primary team Disposition: Needs ICU  Please see attending attestation for final recommendations.   Ina Homes, MD  [facility] PGY3  Pager: [phone]

## 2019-10-26 NOTE — Procedures (Addendum)
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 EEGto evaluate for seizure  Level of alertness:comatose  AEDs during EEG study:keppra, vimpat, propofol, versed  Technical aspects: This EEG study was done with scalp electrodes positioned according to the 10-20 International system of electrode placement. Electrical activity was acquired at a sampling rate of 500Hz  and reviewed with a high frequency filter of 70Hz  and a low frequency filter of 1Hz . EEG data were recorded continuously and digitally stored.  DESCRIPTION:EEG showed  intermittent generalized background suppression for 2-4 seconds alternating with high amplitude sharply contoured 2-5hz  theta-delta slowing as well as sharp waves in left frontocentral region. After around 2300 on 10/25/2019, EEG showed worsening left frontocentral sharps as well as continuous generalized low amplitude 2-3Hz  delta slowing. Again gradually around 5am on 11/06/2019, eeg again improved with generalized background suppression for 2-4 seconds alternating with 2-5hz  theta-delta slowing. Hyperventilation and photic stimulation were not performed.  ABNORMALITY - Sharp waves. Left frontocentral region - Intermittent slow, generalized  - Background suppression, generalized  IMPRESSION: This study showed evidence of epilepsy arising from left frontocentral region. As AED were titrated, eeg improved and showed severe diffuse encephalopathy, non specific to etiology. No definite seizures were  seen.       [name]

## 2019-10-26 NOTE — Progress Notes (Signed)
[facility] Progress Note Patient Name: [name] DOB: 03/27/1971 MRN: [id number]   Date of Service  11/01/2019  HPI/Events of Note  Notified that patient is still on D10 at 40cc/hr. Tube feeding at goal 65cc/hr. CBG 200s  eICU Interventions  Agree with discontinuing D10 and continue to monitor CBGs     Intervention Category Major Interventions: Hyperglycemia - active titration of insulin therapy  Judd Lien 11/02/2019, 4:58 AM

## 2019-10-26 NOTE — Transfer of Care (Signed)
Immediate Anesthesia Transfer of Care Note  Patient: ADEL NEYER  Procedure(s) Performed: MEDIASTERNAL [place] OUT,  ECMO DECANNULATION AND STERNAL CLOSURE (N/A ) TRANSESOPHAGEAL ECHOCARDIOGRAM (TEE) (N/A )  Patient Location: SICU  Anesthesia Type:General  Level of Consciousness: sedated and Patient remains intubated per anesthesia plan  Airway & Oxygen Therapy: Patient remains intubated per anesthesia plan and Patient placed on Ventilator (see vital sign flow sheet for setting)  Post-op Assessment: Report given to RN and Post -op Vital signs reviewed and stable  Post vital signs: Reviewed and stable  Last Vitals:  Vitals Value Taken Time  BP    Temp    Pulse    Resp 0 11/06/2019 1326  SpO2 100 % 10/20/2019 1326  Vitals shown include unvalidated device data.  Last Pain:  Vitals:   10/25/19 2000  TempSrc:   PainSc: 0-No pain         Complications: No apparent anesthesia complications

## 2019-10-26 NOTE — Progress Notes (Signed)
[address]       [place] [address]             [phone]        CARDIOTHORACIC SURGERY PROGRESS NOTE  5 Days Post-opS/P Procedure(s) (LRB): MINIMALLY INVASIVE TRICUSPID VALVE REPAIR USING MC3 TRICUSPID ANNULOPLASTY RING (Right) TRANSESOPHAGEAL ECHOCARDIOGRAM (TEE) (N/A)  5 Days Post-OpS/P Procedure(s) (LRB): Re-exploration of post-operative open heart procedure for bleeding (N/A) Cannulation For Ecmo (Extracorporeal Membrane Oxygenation) (N/A)   4 Days Post-Op Procedure(s) (LRB): EXPLORATION POST OPERATIVE OPEN HEART (N/A) Transesophageal Echocardiogram (Tee) (N/A) Application Of Wound Vac (N/A)   3 Days Post-Op Procedure(s) (LRB): Mediastinal washout and Re-exploration of right chest for evacuation of hemothorax (N/A) Application Of Wound Vac using Abthera dressing. (N/A)  Subjective: Heavily sedated on vent.  Continued seizures overnight.  Objective: Vital signs: BP Readings from Last 1 Encounters:  11/07/2019 93/62   Pulse Readings from Last 1 Encounters:  10/17/2019 83   Resp Readings from Last 1 Encounters:  11/11/2019 (!) 0   Temp Readings from Last 1 Encounters:  10/26/2019 98.6 F (37 C)    Hemodynamics:  MAP 82 ECMO flow 3.7 L/min @ 3300 rpm, sweep 1, Neg pressure -21, delta P 18  Milrinone 0.5 Levophed 3.8  Physical Exam:  Rhythm:   sinus  Breath sounds: clear  Heart sounds:  distant  Incisions:  Wound  VAC intact  Abdomen:  soft  Extremities:  Warm, swollen   Intake/Output from previous day: 04/11 0701 - 04/12 0700 In: 6906.8 [I.V.:2806.5; Blood:315; NG/GT:2366.7; IV Piggyback:1418.6] Out: 6330 [Urine:6010; Drains:50; Chest Tube:270] Intake/Output this shift: Total I/O In: -  Out: 235 [Urine:235]  Lab Results:  CBC: Recent Labs    10/25/19 1706 10/25/19 1715 10/29/2019 0406 11/06/2019 0613  WBC 3.9*  --  4.2  --   HGB 9.5*   < > 9.3* 8.2*  HCT 28.8*   < > 27.8* 24.0*  PLT 30*  --  33*  --    < > =  values in this interval not displayed.    BMET:  Recent Labs    10/25/19 1706 10/25/19 1715 10/25/2019 0406 10/25/2019 0613  NA 145   < > 143 141  K 3.7   < > 4.1 3.8  CL 107  --  104  --   CO2 28  --  28  --   GLUCOSE 93  --  235*  --   BUN 35*  --  40*  --   CREATININE 2.30*  --  2.48*  --   CALCIUM 8.6*  --  8.5*  --    < > = values in this interval not displayed.     PT/INR:   Recent Labs    10/30/2019 0406  LABPROT 13.7  INR 1.1    CBG (last 3)  Recent Labs    11/09/2019 0005 11/02/2019 0408 11/09/2019 0753  GLUCAP 168* 235* 229*    ABG    Component Value Date/Time   PHART 7.414 10/19/2019 0613   PCO2ART 48.2 (H) 10/20/2019 0613   PO2ART 266.0 (H) 11/01/2019 0613   HCO3 30.9 (H) 11/02/2019 0613   TCO2 32 11/09/2019 0613   ACIDBASEDEF 1.0 11/02/2019 1708   O2SAT 100.0 10/28/2019 0613    CXR: Stable opacity right lung field  Assessment/Plan:  Stable over last 48 hours on ECMO support w/ exception of continued seizure activity Hemodynamics and gas exchange c/w likely ability to wean and separate from ECMO  support Weight down, UOP adequate and renal function stable Severe thrombocytopenia persists Underlying neurologic status unclear   I spoke with patient's husband over the telephone this morning and discussed our plan to take patient back to OR this afternoon for mediastinal washout and possible separation from ECMO support, decannulation and sternal closure.  Concurrent issues and overall prognosis discussed.  All questions answered.    Rexene Alberts, MD 10/29/2019 8:02 AM

## 2019-10-26 NOTE — Progress Notes (Signed)
Patient transported to OR. Placed on battery and O2 tank. OR team, CRNA, ECMO specialist and respiratory transported. Emergency medications and supplies were available at all times. Upon arrival to destination, circuit was connected to wall oxygen source and plugged into a red power outlet. Transport was uneventful.

## 2019-10-26 NOTE — Progress Notes (Signed)
  Amiodarone Drug - Drug Interaction Consult Note  Recommendations: None, monitor for now  Amiodarone is metabolized by the cytochrome P450 system and therefore has the potential to cause many drug interactions. Amiodarone has an average plasma half-life of 50 days (range 20 to 100 days).   There is potential for drug interactions to occur several weeks or months after stopping treatment and the onset of drug interactions may be slow after initiating amiodarone.   []  Statins: Increased risk of myopathy. Simvastatin- restrict dose to 20mg  daily. Other statins: counsel patients to report any muscle pain or weakness immediately.  []  Anticoagulants: Amiodarone can increase anticoagulant effect. Consider warfarin dose reduction. Patients should be monitored closely and the dose of anticoagulant altered accordingly, remembering that amiodarone levels take several weeks to stabilize.  [x]  Antiepileptics: Amiodarone can increase plasma concentration of phenytoin, the dose should be reduced. Note that small changes in phenytoin dose can result in large changes in levels. Monitor patient and counsel on signs of toxicity.  []  Beta blockers: increased risk of bradycardia, AV block and myocardial depression. Sotalol - avoid concomitant use.  []   Calcium channel blockers (diltiazem and verapamil): increased risk of bradycardia, AV block and myocardial depression.  []   Cyclosporine: Amiodarone increases levels of cyclosporine. Reduced dose of cyclosporine is recommended.  []  Digoxin dose should be halved when amiodarone is started.  []  Diuretics: increased risk of cardiotoxicity if hypokalemia occurs.  []  Oral hypoglycemic agents (glyburide, glipizide, glimepiride): increased risk of hypoglycemia. Patient's glucose levels should be monitored closely when initiating amiodarone therapy.   []  Drugs that prolong the QT interval:  Torsades de pointes risk may be increased with concurrent use - avoid if  possible.  Monitor QTc, also keep magnesium/potassium WNL if concurrent therapy can't be avoided. Marland Kitchen Antibiotics: e.g. fluoroquinolones, erythromycin. . Antiarrhythmics: e.g. quinidine, procainamide, disopyramide, sotalol. . Antipsychotics: e.g. phenothiazines, haloperidol.  . Lithium, tricyclic antidepressants, and methadone. Thank You,  Pat Patrick  10/18/2019 1:53 PM

## 2019-10-26 NOTE — Progress Notes (Signed)
Patient ID: [name], female   DOB: 04/30/1971, 49 y.o.   MRN: [id number] [facility] Evening Rounds:  ECMO decannulation in OR this morning. Hemodynamics stable on Milrinone 0.5, NE 3. NO,  CI 2.1. CVP 16.  Co-ox 87.6 this afternoon if accurate.  Remains on versed and propofol on vent for seizure activity over the weekend. EEG ongoing.  Has been oliguric since returning from the OR. UO was 100-200/hr this am on lasix 8 and now 15/hr on lasix 12.  CT output low.  CBC    Component Value Date/Time   WBC 6.0 10/30/2019 1348   RBC 3.58 (L) 11/13/2019 1348   HGB 10.2 (L) 10/25/2019 1354   HGB 10.2 (L) 11/17/2008 1121   HCT 30.0 (L) 10/28/2019 1354   HCT 30.4 (L) 11/17/2008 1121   PLT 126 (L) 10/20/2019 1348   PLT 123 (L) 11/17/2008 1121   MCV 89.7 10/25/2019 1348   MCV 87.0 11/17/2008 1121   MCH 30.2 10/25/2019 1348   MCHC 33.6 11/03/2019 1348   RDW 15.5 11/11/2019 1348   RDW 13.3 11/17/2008 1121   LYMPHSABS 0.2 (L) 10/20/2019 1749   LYMPHSABS 1.0 11/17/2008 1121   MONOABS 0.2 10/28/2019 1749   MONOABS 0.3 11/17/2008 1121   EOSABS 0.0 10/27/2019 1749   EOSABS 0.0 11/17/2008 1121   BASOSABS 0.0 10/18/2019 1749   BASOSABS 0.0 11/17/2008 1121   BMET    Component Value Date/Time   NA 142 11/12/2019 1354   K 4.0 10/17/2019 1354   CL 104 10/27/2019 0406   CO2 28 11/07/2019 0406   GLUCOSE 235 (H) 11/07/2019 0406   BUN 40 (H) 10/15/2019 0406   CREATININE 2.48 (H) 11/03/2019 0406   CALCIUM 8.5 (L) 11/08/2019 0406   GFRNONAA 23 (L) 11/13/2019 0406   GFRAA 26 (L) 11/11/2019 0406

## 2019-10-26 NOTE — Anesthesia Preprocedure Evaluation (Addendum)
Anesthesia Evaluation   Patient unresponsive    Reviewed: NPO status , Patient's Chart, lab work & pertinent test results, Unable to perform ROS - Chart review only  History of Anesthesia Complications (+) history of anesthetic complications (CPR on arrival to OR 10/18/2019)  Airway Mallampati: Intubated       Dental   Pulmonary  Vent dependent since tricuspid valve repair   + rhonchi        Cardiovascular hypertension, Pt. on medications  Rhythm:Regular Rate:Normal  S/p tricuspid valve repair: post op bleeding, CPR required on return to OR for exploration of bleeding, stablized with ECMO  Now hopeful for separation from ECMO   Neuro/Psych Seizures - (pt began seizing 2d ago),  S/p prolonged CPR: now with seizure activity    GI/Hepatic   Endo/Other  diabetes, Oral Hypoglycemic AgentsHypothyroidism Lupus  Renal/GU Renal InsufficiencyRenal disease (creat 2.48)     Musculoskeletal   Abdominal   Peds  Hematology  (+) anemia , H/o TTP Low dose heparin for ECMO  Hb 8.2, plt 33k   Anesthesia Other Findings   Reproductive/Obstetrics                            Anesthesia Physical Anesthesia Plan  ASA: IV  Anesthesia Plan: General   Post-op Pain Management:    Induction:   PONV Risk Score and Plan: Treatment may vary due to age or medical condition  Airway Management Planned: Oral ETT  Additional Equipment: Arterial line, CVP, PA Cath and TEE  Intra-op Plan:   Post-operative Plan: Post-operative intubation/ventilation  Informed Consent: I have reviewed the patients History and Physical, chart, labs and discussed the procedure including the risks, benefits and alternatives for the proposed anesthesia with the patient or authorized representative who has indicated his/her understanding and acceptance.       Plan Discussed with:   Anesthesia Plan Comments:        Anesthesia  Quick Evaluation

## 2019-10-27 ENCOUNTER — Inpatient Hospital Stay: Payer: Self-pay

## 2019-10-27 ENCOUNTER — Inpatient Hospital Stay (HOSPITAL_COMMUNITY): Payer: BC Managed Care – PPO

## 2019-10-27 DIAGNOSIS — R092 Respiratory arrest: Secondary | ICD-10-CM

## 2019-10-27 DIAGNOSIS — G40901 Epilepsy, unspecified, not intractable, with status epilepticus: Secondary | ICD-10-CM | POA: Diagnosis not present

## 2019-10-27 LAB — CBC
HCT: 33.2 % — ABNORMAL LOW (ref 36.0–46.0)
HCT: 34.8 % — ABNORMAL LOW (ref 36.0–46.0)
Hemoglobin: 11.1 g/dL — ABNORMAL LOW (ref 12.0–15.0)
Hemoglobin: 11.8 g/dL — ABNORMAL LOW (ref 12.0–15.0)
MCH: 30.2 pg (ref 26.0–34.0)
MCH: 29.7 pg (ref 26.0–34.0)
MCHC: 33.4 g/dL (ref 30.0–36.0)
MCHC: 33.9 g/dL (ref 30.0–36.0)
MCV: 90.2 fL (ref 80.0–100.0)
MCV: 87.7 fL (ref 80.0–100.0)
Platelets: 83 10*3/uL — ABNORMAL LOW (ref 150–400)
Platelets: 66 10*3/uL — ABNORMAL LOW (ref 150–400)
RBC: 3.68 MIL/uL — ABNORMAL LOW (ref 3.87–5.11)
RBC: 3.97 MIL/uL (ref 3.87–5.11)
RDW: 16.1 % — ABNORMAL HIGH (ref 11.5–15.5)
RDW: 16.1 % — ABNORMAL HIGH (ref 11.5–15.5)
WBC: 6.7 10*3/uL (ref 4.0–10.5)
WBC: 7.2 10*3/uL (ref 4.0–10.5)
nRBC: 0.9 % — ABNORMAL HIGH (ref 0.0–0.2)
nRBC: 0.6 % — ABNORMAL HIGH (ref 0.0–0.2)

## 2019-10-27 LAB — COOXEMETRY PANEL
Carboxyhemoglobin: 1.5 % (ref 0.5–1.5)
Carboxyhemoglobin: 1.6 % — ABNORMAL HIGH (ref 0.5–1.5)
Methemoglobin: 1.2 % (ref 0.0–1.5)
Methemoglobin: 1.4 % (ref 0.0–1.5)
O2 Saturation: 85.4 %
O2 Saturation: 86.5 %
Total hemoglobin: 11 g/dL — ABNORMAL LOW (ref 12.0–16.0)
Total hemoglobin: 10.6 g/dL — ABNORMAL LOW (ref 12.0–16.0)

## 2019-10-27 LAB — HEPATIC FUNCTION PANEL
ALT: 36 U/L (ref 0–44)
AST: 44 U/L — ABNORMAL HIGH (ref 15–41)
Albumin: 3.4 g/dL — ABNORMAL LOW (ref 3.5–5.0)
Alkaline Phosphatase: 102 U/L (ref 38–126)
Bilirubin, Direct: 0.5 mg/dL — ABNORMAL HIGH (ref 0.0–0.2)
Indirect Bilirubin: 1.3 mg/dL — ABNORMAL HIGH (ref 0.3–0.9)
Total Bilirubin: 1.8 mg/dL — ABNORMAL HIGH (ref 0.3–1.2)
Total Protein: 5.3 g/dL — ABNORMAL LOW (ref 6.5–8.1)

## 2019-10-27 LAB — POCT I-STAT 7, (LYTES, BLD GAS, ICA,H+H)
Acid-Base Excess: 4 mmol/L — ABNORMAL HIGH (ref 0.0–2.0)
Acid-Base Excess: 3 mmol/L — ABNORMAL HIGH (ref 0.0–2.0)
Acid-Base Excess: 2 mmol/L (ref 0.0–2.0)
Acid-Base Excess: 1 mmol/L (ref 0.0–2.0)
Bicarbonate: 28.2 mmol/L — ABNORMAL HIGH (ref 20.0–28.0)
Bicarbonate: 27.9 mmol/L (ref 20.0–28.0)
Bicarbonate: 28.1 mmol/L — ABNORMAL HIGH (ref 20.0–28.0)
Bicarbonate: 26.5 mmol/L (ref 20.0–28.0)
Bicarbonate: 26.2 mmol/L (ref 20.0–28.0)
Calcium, Ion: 1.07 mmol/L — ABNORMAL LOW (ref 1.15–1.40)
Calcium, Ion: 1.05 mmol/L — ABNORMAL LOW (ref 1.15–1.40)
Calcium, Ion: 1.17 mmol/L (ref 1.15–1.40)
Calcium, Ion: 1.13 mmol/L — ABNORMAL LOW (ref 1.15–1.40)
Calcium, Ion: 1.15 mmol/L (ref 1.15–1.40)
HCT: 23 % — ABNORMAL LOW (ref 36.0–46.0)
HCT: 26 % — ABNORMAL LOW (ref 36.0–46.0)
HCT: 32 % — ABNORMAL LOW (ref 36.0–46.0)
HCT: 31 % — ABNORMAL LOW (ref 36.0–46.0)
HCT: 31 % — ABNORMAL LOW (ref 36.0–46.0)
Hemoglobin: 7.8 g/dL — ABNORMAL LOW (ref 12.0–15.0)
Hemoglobin: 8.8 g/dL — ABNORMAL LOW (ref 12.0–15.0)
Hemoglobin: 10.9 g/dL — ABNORMAL LOW (ref 12.0–15.0)
Hemoglobin: 10.5 g/dL — ABNORMAL LOW (ref 12.0–15.0)
Hemoglobin: 10.5 g/dL — ABNORMAL LOW (ref 12.0–15.0)
O2 Saturation: 100 %
O2 Saturation: 100 %
O2 Saturation: 94 %
O2 Saturation: 95 %
O2 Saturation: 96 %
Patient temperature: 36.6
Patient temperature: 36.7
Patient temperature: 36.6
Patient temperature: 36
Potassium: 4.2 mmol/L (ref 3.5–5.1)
Potassium: 4 mmol/L (ref 3.5–5.1)
Potassium: 4.4 mmol/L (ref 3.5–5.1)
Potassium: 4.2 mmol/L (ref 3.5–5.1)
Potassium: 4.1 mmol/L (ref 3.5–5.1)
Sodium: 151 mmol/L — ABNORMAL HIGH (ref 135–145)
Sodium: 150 mmol/L — ABNORMAL HIGH (ref 135–145)
Sodium: 141 mmol/L (ref 135–145)
Sodium: 142 mmol/L (ref 135–145)
Sodium: 140 mmol/L (ref 135–145)
TCO2: 29 mmol/L (ref 22–32)
TCO2: 29 mmol/L (ref 22–32)
TCO2: 30 mmol/L (ref 22–32)
TCO2: 28 mmol/L (ref 22–32)
TCO2: 27 mmol/L (ref 22–32)
pCO2 arterial: 39.6 mmHg (ref 32.0–48.0)
pCO2 arterial: 45.9 mmHg (ref 32.0–48.0)
pCO2 arterial: 51.6 mmHg — ABNORMAL HIGH (ref 32.0–48.0)
pCO2 arterial: 49.3 mmHg — ABNORMAL HIGH (ref 32.0–48.0)
pCO2 arterial: 41.3 mmHg (ref 32.0–48.0)
pH, Arterial: 7.458 — ABNORMAL HIGH (ref 7.350–7.450)
pH, Arterial: 7.392 (ref 7.350–7.450)
pH, Arterial: 7.342 — ABNORMAL LOW (ref 7.350–7.450)
pH, Arterial: 7.337 — ABNORMAL LOW (ref 7.350–7.450)
pH, Arterial: 7.406 (ref 7.350–7.450)
pO2, Arterial: 320 mmHg — ABNORMAL HIGH (ref 83.0–108.0)
pO2, Arterial: 236 mmHg — ABNORMAL HIGH (ref 83.0–108.0)
pO2, Arterial: 77 mmHg — ABNORMAL LOW (ref 83.0–108.0)
pO2, Arterial: 83 mmHg (ref 83.0–108.0)
pO2, Arterial: 75 mmHg — ABNORMAL LOW (ref 83.0–108.0)

## 2019-10-27 LAB — BASIC METABOLIC PANEL
Anion gap: 12 (ref 5–15)
Anion gap: 15 (ref 5–15)
BUN: 44 mg/dL — ABNORMAL HIGH (ref 6–20)
BUN: 48 mg/dL — ABNORMAL HIGH (ref 6–20)
CO2: 26 mmol/L (ref 22–32)
CO2: 22 mmol/L (ref 22–32)
Calcium: 8.5 mg/dL — ABNORMAL LOW (ref 8.9–10.3)
Calcium: 8.5 mg/dL — ABNORMAL LOW (ref 8.9–10.3)
Chloride: 103 mmol/L (ref 98–111)
Chloride: 100 mmol/L (ref 98–111)
Creatinine, Ser: 2.8 mg/dL — ABNORMAL HIGH (ref 0.44–1.00)
Creatinine, Ser: 3.11 mg/dL — ABNORMAL HIGH (ref 0.44–1.00)
GFR calc Af Amer: 23 mL/min — ABNORMAL LOW (ref >60)
GFR calc Af Amer: 20 mL/min — ABNORMAL LOW (ref >60)
GFR calc non Af Amer: 19 mL/min — ABNORMAL LOW (ref >60)
GFR calc non Af Amer: 17 mL/min — ABNORMAL LOW (ref >60)
Glucose, Bld: 72 mg/dL (ref 70–99)
Glucose, Bld: 94 mg/dL (ref 70–99)
Potassium: 4.5 mmol/L (ref 3.5–5.1)
Potassium: 4.1 mmol/L (ref 3.5–5.1)
Sodium: 141 mmol/L (ref 135–145)
Sodium: 137 mmol/L (ref 135–145)

## 2019-10-27 LAB — MAGNESIUM
Magnesium: 2.3 mg/dL (ref 1.7–2.4)
Magnesium: 2.4 mg/dL (ref 1.7–2.4)

## 2019-10-27 LAB — VANCOMYCIN, RANDOM: Vancomycin Rm: 40

## 2019-10-27 LAB — POCT I-STAT, CHEM 8
BUN: 26 mg/dL — ABNORMAL HIGH (ref 6–20)
Calcium, Ion: 1.06 mmol/L — ABNORMAL LOW (ref 1.15–1.40)
Chloride: 111 mmol/L (ref 98–111)
Creatinine, Ser: 2.2 mg/dL — ABNORMAL HIGH (ref 0.44–1.00)
Glucose, Bld: 87 mg/dL (ref 70–99)
HCT: 23 % — ABNORMAL LOW (ref 36.0–46.0)
Hemoglobin: 7.8 g/dL — ABNORMAL LOW (ref 12.0–15.0)
Potassium: 4.1 mmol/L (ref 3.5–5.1)
Sodium: 149 mmol/L — ABNORMAL HIGH (ref 135–145)
TCO2: 27 mmol/L (ref 22–32)

## 2019-10-27 LAB — BPAM PLATELET PHERESIS
Blood Product Expiration Date: 202104131006
Blood Product Expiration Date: 202104132359
ISSUE DATE / TIME: 202104121057
ISSUE DATE / TIME: 202104121057
Unit Type and Rh: 5100
Unit Type and Rh: 6200

## 2019-10-27 LAB — BPAM FFP
Blood Product Expiration Date: 202104152359
Blood Product Expiration Date: 202104152359
ISSUE DATE / TIME: 202104121106
ISSUE DATE / TIME: 202104130654
Unit Type and Rh: 600
Unit Type and Rh: 600

## 2019-10-27 LAB — PREPARE FRESH FROZEN PLASMA
Unit division: 0
Unit division: 0

## 2019-10-27 LAB — PREPARE PLATELET PHERESIS
Unit division: 0
Unit division: 0

## 2019-10-27 LAB — GLUCOSE, CAPILLARY
Glucose-Capillary: 100 mg/dL — ABNORMAL HIGH (ref 70–99)
Glucose-Capillary: 106 mg/dL — ABNORMAL HIGH (ref 70–99)
Glucose-Capillary: 109 mg/dL — ABNORMAL HIGH (ref 70–99)
Glucose-Capillary: 131 mg/dL — ABNORMAL HIGH (ref 70–99)
Glucose-Capillary: 36 mg/dL — CL (ref 70–99)
Glucose-Capillary: 49 mg/dL — ABNORMAL LOW (ref 70–99)
Glucose-Capillary: 56 mg/dL — ABNORMAL LOW (ref 70–99)
Glucose-Capillary: 64 mg/dL — ABNORMAL LOW (ref 70–99)
Glucose-Capillary: 74 mg/dL (ref 70–99)
Glucose-Capillary: 75 mg/dL (ref 70–99)
Glucose-Capillary: 80 mg/dL (ref 70–99)
Glucose-Capillary: 90 mg/dL (ref 70–99)

## 2019-10-27 LAB — PROTIME-INR
INR: 1.2 (ref 0.8–1.2)
Prothrombin Time: 14.8 seconds (ref 11.4–15.2)

## 2019-10-27 LAB — PROCALCITONIN: Procalcitonin: 7.32 ng/mL

## 2019-10-27 MED ORDER — SODIUM CHLORIDE 0.9% FLUSH
10.0000 mL | Freq: Two times a day (BID) | INTRAVENOUS | Status: DC
  Administered 2019-10-27 – 2019-10-28 (×4): 10 mL

## 2019-10-27 MED ORDER — DEXTROSE 50 % IV SOLN: 12.5000 g | INTRAVENOUS | Status: AC

## 2019-10-27 MED ORDER — DEXTROSE 50 % IV SOLN
25.0000 g | INTRAVENOUS | Status: AC
  Administered 2019-10-27: 25 g via INTRAVENOUS

## 2019-10-27 MED ORDER — SODIUM CHLORIDE 0.9 % IV SOLN
20.0000 mg/h | INTRAVENOUS | Status: DC
  Administered 2019-10-27: 20 mg/h via INTRAVENOUS
  Filled 2019-10-27 (×2): qty 20

## 2019-10-27 MED ORDER — SODIUM CHLORIDE 0.9 % IV SOLN
INTRAVENOUS | Status: AC
  Filled 2019-10-27: qty 25

## 2019-10-27 MED ORDER — DEXTROSE 50 % IV SOLN
INTRAVENOUS | Status: AC
  Administered 2019-10-27: 50 mL via INTRAVENOUS
  Filled 2019-10-27: qty 50

## 2019-10-27 MED ORDER — HEPARIN SODIUM (PORCINE) 5000 UNIT/ML IJ SOLN
5000.0000 [IU] | Freq: Three times a day (TID) | INTRAMUSCULAR | Status: DC
  Administered 2019-10-27: 5000 [IU] via SUBCUTANEOUS
  Filled 2019-10-27: qty 1

## 2019-10-27 MED ORDER — SODIUM CHLORIDE 0.9% FLUSH: 10.0000 mL | INTRAVENOUS | Status: DC | PRN

## 2019-10-27 MED ORDER — METOLAZONE 5 MG PO TABS
5.0000 mg | ORAL_TABLET | Freq: Every day | ORAL | Status: DC
  Administered 2019-10-27 – 2019-10-28 (×2): 5 mg via ORAL
  Filled 2019-10-27 (×2): qty 1

## 2019-10-27 MED ORDER — VANCOMYCIN VARIABLE DOSE PER UNSTABLE RENAL FUNCTION (PHARMACIST DOSING): Status: DC

## 2019-10-27 MED ORDER — MIDAZOLAM 50MG/50ML (1MG/ML) PREMIX INFUSION: 20.0000 mg/h | INTRAVENOUS | Status: DC

## 2019-10-27 MED ORDER — METOLAZONE 2.5 MG PO TABS
2.5000 mg | ORAL_TABLET | Freq: Once | ORAL | Status: AC
  Administered 2019-10-27: 2.5 mg via ORAL
  Filled 2019-10-27: qty 1

## 2019-10-27 MED ORDER — MILRINONE LACTATE IN DEXTROSE 20-5 MG/100ML-% IV SOLN
0.1250 ug/kg/min | INTRAVENOUS | Status: DC
  Administered 2019-10-27 (×2): 0.5 ug/kg/min via INTRAVENOUS
  Administered 2019-10-28: 0.375 ug/kg/min via INTRAVENOUS
  Administered 2019-10-28: 0.5 ug/kg/min via INTRAVENOUS
  Administered 2019-10-29: 0.125 ug/kg/min via INTRAVENOUS
  Administered 2019-10-29: 0.25 ug/kg/min via INTRAVENOUS
  Administered 2019-10-30 – 2019-11-08 (×10): 0.125 ug/kg/min via INTRAVENOUS
  Filled 2019-10-27 (×16): qty 100

## 2019-10-27 MED ORDER — DEXTROSE 50 % IV SOLN: 25.0000 g | INTRAVENOUS | Status: AC

## 2019-10-27 MED ORDER — PHENOBARBITAL SODIUM 65 MG/ML IJ SOLN: 1500.0000 mg | INTRAMUSCULAR | Status: DC

## 2019-10-27 MED ORDER — PIVOT 1.5 CAL PO LIQD
1000.0000 mL | ORAL | Status: DC
  Administered 2019-10-27 – 2019-10-28 (×2): 1000 mL
  Filled 2019-10-27: qty 1000

## 2019-10-27 MED ORDER — MIDAZOLAM HCL-SODIUM CHLORIDE 100-0.8 MG/100ML-% IV SOLN: 100.0000 mg | INTRAVENOUS | Status: DC

## 2019-10-27 MED ORDER — DEXTROSE 10 % IV SOLN: INTRAVENOUS | Status: DC

## 2019-10-27 MED ORDER — DEXTROSE 50 % IV SOLN
25.0000 mL | INTRAVENOUS | Status: DC | PRN
  Administered 2019-10-27: 25 mL via INTRAVENOUS
  Filled 2019-10-27: qty 50

## 2019-10-27 MED ORDER — DEXTROSE 50 % IV SOLN
INTRAVENOUS | Status: AC
  Administered 2019-10-27: 12.5 g via INTRAVENOUS
  Filled 2019-10-27: qty 50

## 2019-10-27 MED ORDER — SODIUM CHLORIDE 0.9 % IV SOLN
1.0000 g | Freq: Two times a day (BID) | INTRAVENOUS | Status: DC
  Filled 2019-10-27 (×2): qty 1

## 2019-10-27 MED FILL — Thrombin (Recombinant) For Soln 20000 Unit: CUTANEOUS | Qty: 1 | Status: AC

## 2019-10-27 NOTE — Addendum Note (Signed)
Addendum  created 10/27/19 0753 by Josephine Igo, CRNA   Order list changed

## 2019-10-27 NOTE — Progress Notes (Signed)
[facility] Evening Rounds Remains critically ill BP (!) 97/59   Pulse (!) 110   Temp (!) 97.3 F (36.3 C)   Resp (!) 25   Ht 5\' 7"  (1.702 m)   Wt 98.5 kg   LMP 04/02/2017   SpO2 99%   BMI 34.01 kg/m    Intake/Output Summary (Last 24 hours) at 10/27/2019 1918 Last data filed at 10/27/2019 1200 Gross per 24 hour  Intake 2279.77 ml  Output 505 ml  Net 1774.77 ml  PE: afib/flutter Diminished BS right chest Abd: soft Ext: edema  A/p: continue amiodarone for rhythm disorder Appreciate neurology/nephrology assistance Amand Lemoine Z. Orvan Seen, [place]

## 2019-10-27 NOTE — Consult Note (Signed)
[facility] Renal Consultation Note  Requesting MD: Roxy Manns Indication for Consultation: A on CRF  HPI:  [name] is a 49 y.o. female well-known to me with a longstanding history of lupus and lupus nephritis. Renal biopsy in the past showing proliferative disease-with treatment complete remission around 2005. Most recently, increase of proteinuria between 1 and 2 g and creatinine 1.2-1.4-was on some oral Cytoxan prior to this admission. 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 About a year ago, began to have issues with volume overload-had echo consistent with some pulmonary hypertension and tricuspid valve dysfunction. This was becoming more symptomatic so was admitted on 4/7 for a mini TV repair. Unfortunately, this was complicated by coagulopathy and bleeding which required multiple blood products and reoperation. She also required transient ECMO. she started to develop some acute on chronic kidney injury but was nonoliguric. In fact, urine output was very robust through yesterday morning. Then, around noon time she went to the OR for decannulation and urine output has been lower ever since. She has remained on Levophed. Today, the concern was for an elevated CVP. Lasix which was am 10 mg/h was increased to 15 mg/h and she was given a dose of 2.5 of metolazone. It was felt if she did not respond to this Lasix challenge she may need dialysis so we were consulted. Patient is sedated, intubated. I spoke to her husband at bedside. There is also been an issue of possible seizure activity neurology following  Creatinine, Ser  Date/Time Value Ref Range Status  10/27/2019 04:12 AM 2.80 (H) 0.44 - 1.00 mg/dL Final  10/28/2019 06:33 PM 2.41 (H) 0.44 - 1.00 mg/dL Final  10/24/2019 04:06 AM 2.48 (H) 0.44 - 1.00 mg/dL Final   10/25/2019 05:06 PM 2.30 (H) 0.44 - 1.00 mg/dL Final  10/25/2019 05:10 AM 2.57 (H) 0.44 - 1.00 mg/dL Final  10/24/2019 05:08 PM 2.55 (H) 0.44 - 1.00 mg/dL Final  10/24/2019 03:56 AM 2.49 (H) 0.44 - 1.00 mg/dL Final  11/08/2019 05:52 PM 2.25 (H) 0.44 - 1.00 mg/dL Final  10/19/2019 11:25 AM 2.00 (H) 0.44 - 1.00 mg/dL Final  11/12/2019 07:35 AM 2.10 (H) 0.44 - 1.00 mg/dL Final  10/25/2019 05:12 AM 2.04 (H) 0.44 - 1.00 mg/dL Final  11/02/2019 10:02 PM 1.78 (H) 0.44 - 1.00 mg/dL Final  11/04/2019 06:33 PM 1.40 (H) 0.44 - 1.00 mg/dL Final  10/31/2019 04:32 PM 1.36 (H) 0.44 - 1.00 mg/dL Final  10/29/2019 02:14 PM 1.20 (H) 0.44 - 1.00 mg/dL Final  11/02/2019 11:07 AM 1.42 (H) 0.44 - 1.00 mg/dL Final  10/25/2019 06:36 AM 0.93 0.44 - 1.00 mg/dL Final  10/20/2019 03:34 AM 1.16 (H) 0.44 - 1.00 mg/dL Final  10/15/2019 02:58 AM 0.70 0.44 - 1.00 mg/dL Final  10/31/2019 12:25 AM 0.80 0.44 - 1.00 mg/dL Final  11/08/2019 10:40 PM 0.90 0.44 - 1.00 mg/dL Final  10/25/2019 09:52 PM 0.60 0.44 - 1.00 mg/dL Final  10/17/2019 08:28 PM 0.90 0.44 - 1.00 mg/dL Final  10/30/2019 03:02 PM 1.00 0.44 - 1.00 mg/dL Final  10/30/2019 02:15 PM 1.10 (H) 0.44 - 1.00 mg/dL Final  11/07/2019 01:58 PM 1.10 (H) 0.44 - 1.00 mg/dL Final  11/03/2019 01:32 PM 0.90 0.44 - 1.00 mg/dL Final  11/03/2019 12:32 PM 1.00 0.44 - 1.00 mg/dL Final  10/15/2019 12:02 PM 1.00 0.44 - 1.00 mg/dL Final  10/15/2019 11:28  AM 1.00 0.44 - 1.00 mg/dL Final  11/05/2019 11:04 AM 1.00 0.44 - 1.00 mg/dL Final  11/03/2019 10:49 AM 0.90 0.44 - 1.00 mg/dL Final  11/06/2019 09:30 AM 1.10 (H) 0.44 - 1.00 mg/dL Final  10/19/2019 12:30 PM 1.39 (H) 0.44 - 1.00 mg/dL Final  09/01/2019 10:12 AM 1.52 (H) 0.44 - 1.00 mg/dL Final  08/04/2019 10:25 AM 1.54 (H) 0.44 - 1.00 mg/dL Final  04/15/2019 11:01 AM 1.23 (H) 0.44 - 1.00 mg/dL Final  03/03/2019 12:26 PM 1.49 (H) 0.44 - 1.00 mg/dL Final  12/30/2018 09:00 AM 1.04 (H) 0.44 - 1.00 mg/dL Final  08/13/2018 10:17 AM  0.93 0.44 - 1.00 mg/dL Final  07/03/2018 03:31 PM 0.84 0.44 - 1.00 mg/dL Final  07/07/2013 01:04 AM 1.53 (H) 0.50 - 1.10 mg/dL Final  05/28/2013 12:50 PM 1.26 (H) 0.50 - 1.10 mg/dL Final  04/30/2013 01:05 PM 1.25 (H) 0.50 - 1.10 mg/dL Final  04/02/2013 12:20 PM 1.10 0.50 - 1.10 mg/dL Final  03/05/2013 01:10 PM 1.17 (H) 0.50 - 1.10 mg/dL Final  02/05/2013 12:30 PM 1.10 0.50 - 1.10 mg/dL Final  01/08/2013 01:00 PM 1.14 (H) 0.50 - 1.10 mg/dL Final  12/11/2012 12:40 PM 1.11 (H) 0.50 - 1.10 mg/dL Final  11/13/2012 01:10 PM 1.19 (H) 0.50 - 1.10 mg/dL Final  10/16/2012 12:30 PM 1.09 0.50 - 1.10 mg/dL Final  09/18/2012 02:00 PM 0.96 0.50 - 1.10 mg/dL Final     PMHx:   Past Medical History:  Diagnosis Date  . Avascular necrosis ([place])   . Diabetes mellitus without complication ([place]) 16/6063  . Hypertension   . Hypothyroidism 01/2019  . Lupus ([place])   . Lupus nephritis ([place])   . Prediabetes 10/2011  . Pulmonary hypertension ([place])   . 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 Tricuspid regurgitation   . 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 APPLICATION OF WOUND VAC N/A 10/30/2019   Procedure: Application Of Wound Vac;  Surgeon: Rexene Alberts, MD;  Location: [place];  Service: Open Heart Surgery;  Laterality: N/A;  . APPLICATION OF WOUND VAC N/A 11/08/2019   Procedure: Application Of Wound Vac using Abthera dressing.;  Surgeon: Rexene Alberts, MD;  Location: [facility];  Service: Thoracic;  Laterality: N/A;  . bilat hip grafts     2007  . BUNIONECTOMY  2018  . CANNULATION FOR ECMO (EXTRACORPOREAL MEMBRANE OXYGENATION) BEDSIDE N/A 11/06/2019   Procedure: Cannulation For Ecmo (Extracorporeal Membrane Oxygenation);  Surgeon: Rexene Alberts, MD;  Location: [facility];  Service: Open Heart Surgery;  Laterality: N/A;  . EXPLORATION POST OPERATIVE OPEN HEART N/A 11/06/2019   Procedure: Re-exploration of post-operative open heart procedure for bleeding;  Surgeon: Rexene Alberts, MD;  Location: [facility];  Service: Open Heart Surgery;  Laterality: N/A;  . EXPLORATION POST OPERATIVE OPEN HEART N/A 11/12/2019   Procedure: EXPLORATION POST OPERATIVE OPEN HEART;  Surgeon: Rexene Alberts, MD;  Location: [facility];  Service: Open Heart Surgery;  Laterality: N/A;  . HIP SURGERY     x2  . MEDIASTERNOTOMY N/A 11/12/2019   Procedure: MEDIASTERNAL WASH OUT,  ECMO DECANNULATION AND STERNAL CLOSURE;  Surgeon: Rexene Alberts, MD;  Location: [facility];  Service: Thoracic;  Laterality: N/A;  . MEDIASTINAL EXPLORATION N/A 10/24/2019  Procedure: Mediastinal washout and Re-exploration of right chest for evacuation of hemothorax;  Surgeon: Rexene Alberts, MD;  Location: [facility] OR;  Service: Thoracic;  Laterality: N/A;  . MINIMALLY INVASIVE TRICUSPID VALVE REPAIR Right 11/11/2019   Procedure: MINIMALLY INVASIVE TRICUSPID VALVE REPAIR USING MC3 TRICUSPID ANNULOPLASTY RING;  Surgeon: Rexene Alberts, MD;  Location: [place] [place];  Service: Open Heart Surgery;  Laterality: Right;  . RIGHT HEART CATH N/A 01/02/2019   Procedure: RIGHT HEART CATH;  Surgeon: Jolaine Artist, MD;  Location: [place] [facility];  Service: Cardiovascular;  Laterality: N/A;  . RIGHT/LEFT HEART CATH AND CORONARY ANGIOGRAPHY N/A 07/11/2018   Procedure: RIGHT/LEFT HEART CATH AND CORONARY ANGIOGRAPHY;  Surgeon: Jolaine Artist, MD;  Location: [facility];  Service: Cardiovascular;  Laterality: N/A;  . SHOULDER SURGERY Bilateral 2015   Core displacement  . TEE WITHOUT CARDIOVERSION N/A 08/07/2019   Procedure: TRANSESOPHAGEAL ECHOCARDIOGRAM (TEE);  Surgeon: Jolaine Artist, MD;  Location: [facility] ENDOSCOPY;  Service: Cardiovascular;  Laterality: N/A;  . TEE WITHOUT CARDIOVERSION N/A 11/09/2019    Procedure: TRANSESOPHAGEAL ECHOCARDIOGRAM (TEE);  Surgeon: Rexene Alberts, MD;  Location: [facility];  Service: Open Heart Surgery;  Laterality: N/A;  . TEE WITHOUT CARDIOVERSION N/A 11/09/2019   Procedure: Transesophageal Echocardiogram (Tee);  Surgeon: Rexene Alberts, MD;  Location: [facility];  Service: Open Heart Surgery;  Laterality: N/A;  . TEE WITHOUT CARDIOVERSION N/A 10/17/2019   Procedure: TRANSESOPHAGEAL ECHOCARDIOGRAM (TEE);  Surgeon: Rexene Alberts, MD;  Location: [facility];  Service: Thoracic;  Laterality: N/A;  . TOTAL HIP ARTHROPLASTY  08/2016 and 10/2016   x2  . UMBILICAL HERNIA REPAIR      Family Hx:  Family History  Problem Relation Age of Onset  . Breast cancer Mother   . Heart disease Mother   . Cancer Mother   . Breast cancer Maternal Grandmother   . Cancer Maternal Grandmother   . Clotting disorder Father   . Hypertension Father   . Anemia Father   . Sleep apnea Father   . Cancer Maternal Grandfather     Social History:  reports that she has never smoked. She has never used smokeless tobacco. She reports that she does not drink alcohol or use drugs.  Allergies:  Allergies  Allergen Reactions  . Lisinopril     angioedema  . Penicillins Hives    DID THE REACTION INVOLVE: Swelling of the face/tongue/throat, SOB, or low BP? Unknown Sudden or severe rash/hives, skin peeling, or the inside of the mouth or nose? Unknown Did it require medical treatment? Unknown When did it last happen?age 8 If all above answers are "NO", may proceed with cephalosporin use.     Medications: Prior to Admission medications   Medication Sig Start Date End Date Taking? Authorizing Provider  furosemide (LASIX) 40 MG tablet Take 1 tablet (40 mg total) by mouth 3 (three) times a week. Every Monday, Wednesday and Friday Patient taking differently: Take 40 mg by mouth every Monday, Wednesday, and Friday.  04/15/19 04/14/20 Yes [name], [name], MD  glucose 4 GM chewable tablet Chew 4  tablets by mouth in the morning.   Yes [provider]  hydroxychloroquine (PLAQUENIL) 200 MG tablet Take 400 mg by mouth daily.    Yes [provider]  levothyroxine (SYNTHROID) 50 MCG tablet Take 50 mcg by mouth daily before breakfast.    Yes [provider]  losartan (COZAAR) 50 MG tablet Take 50 mg by mouth daily.  Yes [provider]  metFORMIN (GLUCOPHAGE) 500 MG tablet Take 500-1,000 mg by mouth See admin instructions. Take 1000 mg by mouth in the morning and 500 mg in the evening   Yes [provider]  spironolactone (ALDACTONE) 25 MG tablet TAKE (1/2) TABLET DAILY. Patient taking differently: Take 12.5 mg by mouth every Monday, Wednesday, and Friday.  09/11/19  Yes [name], [name], MD  potassium chloride SA (KLOR-CON) 20 MEQ tablet Take 1 tablet (20 mEq total) by mouth 3 (three) times a week. Every Monday, Wednesday, and Friday 04/15/19   [name], [name], MD    I have reviewed the patient's current medications.  Labs:  Results for orders placed or performed during the hospital encounter of 10/20/2019 (from the past 48 hour(s))  CBC     Status: Abnormal   Collection Time: 10/25/19  5:06 PM  Result Value Ref Range   WBC 3.9 (L) 4.0 - 10.5 K/uL   RBC 3.23 (L) 3.87 - 5.11 MIL/uL   Hemoglobin 9.5 (L) 12.0 - 15.0 g/dL   HCT 28.8 (L) 36.0 - 46.0 %   MCV 89.2 80.0 - 100.0 fL   MCH 29.4 26.0 - 34.0 pg   MCHC 33.0 30.0 - 36.0 g/dL   RDW 15.9 (H) 11.5 - 15.5 %   Platelets 30 (L) 150 - 400 K/uL    Comment: SPECIMEN CHECKED FOR CLOTS CONSISTENT WITH PREVIOUS RESULT Immature Platelet Fraction may be clinically indicated, consider ordering this additional test [id number] REPEATED TO VERIFY    nRBC 0.8 (H) 0.0 - 0.2 %    Comment: Performed at [facility], [address], [place], [place] [address]  Basic metabolic panel     Status: Abnormal   Collection Time: 10/25/19  5:06 PM  Result Value Ref Range   Sodium 145 135 - 145 mmol/L    Potassium 3.7 3.5 - 5.1 mmol/L   Chloride 107 98 - 111 mmol/L   CO2 28 22 - 32 mmol/L   Glucose, Bld 93 70 - 99 mg/dL    Comment: Glucose reference range applies only to samples taken after fasting for at least 8 hours.   BUN 35 (H) 6 - 20 mg/dL   Creatinine, Ser 2.30 (H) 0.44 - 1.00 mg/dL   Calcium 8.6 (L) 8.9 - 10.3 mg/dL   GFR calc non Af Amer 25 (L) >60 mL/min   GFR calc Af Amer 29 (L) >60 mL/min   Anion gap 10 5 - 15    Comment: Performed at [facility] [address] [place], [place] [address]  APTT     Status: Abnormal   Collection Time: 10/25/19  5:06 PM  Result Value Ref Range   aPTT 65 (H) 24 - 36 seconds    Comment:        IF BASELINE aPTT IS ELEVATED, SUGGEST PATIENT RISK ASSESSMENT BE USED TO DETERMINE APPROPRIATE ANTICOAGULANT THERAPY. Performed at [facility], [place] [address] [place], [place] [address]   Heparin level (unfractionated)     Status: Abnormal   Collection Time: 10/25/19  5:06 PM  Result Value Ref Range   Heparin Unfractionated <0.10 (L) 0.30 - 0.70 IU/mL    Comment: REPEATED TO VERIFY (NOTE) If heparin results are below expected values, and patient dosage has  been confirmed, suggest follow up testing of antithrombin III levels. Performed at [facility], [place] [address], [place], [place] [address]   Glucose, capillary     Status:  None   Collection Time: 10/25/19  5:12 PM  Result Value Ref Range   Glucose-Capillary 82 70 - 99 mg/dL    Comment: Glucose reference range applies only to samples taken after fasting for at least 8 hours.  I-STAT 7, (LYTES, BLD GAS, ICA, H+H)     Status: Abnormal   Collection Time: 10/25/19  5:15 PM  Result Value Ref Range   pH, Arterial 7.426 7.350 - 7.450   pCO2 arterial 49.7 (H) 32.0 - 48.0 mmHg   pO2, Arterial 359.0 (H) 83.0 - 108.0 mmHg   Bicarbonate 32.7 (H) 20.0 - 28.0 mmol/L   TCO2 34 (H) 22 - 32 mmol/L   O2 Saturation 100.0 %   Acid-Base Excess 7.0 (H) 0.0 - 2.0 mmol/L   Sodium 143  135 - 145 mmol/L   Potassium 3.6 3.5 - 5.1 mmol/L   Calcium, Ion 1.24 1.15 - 1.40 mmol/L   HCT 26.0 (L) 36.0 - 46.0 %   Hemoglobin 8.8 (L) 12.0 - 15.0 g/dL   Patient temperature HIDE    Sample type ARTERIAL   Glucose, capillary     Status: Abnormal   Collection Time: 10/25/19  8:26 PM  Result Value Ref Range   Glucose-Capillary 114 (H) 70 - 99 mg/dL    Comment: Glucose reference range applies only to samples taken after fasting for at least 8 hours.  APTT     Status: Abnormal   Collection Time: 10/25/19  9:51 PM  Result Value Ref Range   aPTT 60 (H) 24 - 36 seconds    Comment:        IF BASELINE aPTT IS ELEVATED, SUGGEST PATIENT RISK ASSESSMENT BE USED TO DETERMINE APPROPRIATE ANTICOAGULANT THERAPY. Performed at [facility], [place] [address], [place], [place] [address]   I-STAT 7, (LYTES, BLD GAS, ICA, H+H)     Status: Abnormal   Collection Time: 10/25/19  9:55 PM  Result Value Ref Range   pH, Arterial 7.454 (H) 7.350 - 7.450   pCO2 arterial 47.6 32.0 - 48.0 mmHg   pO2, Arterial 348.0 (H) 83.0 - 108.0 mmHg   Bicarbonate 33.5 (H) 20.0 - 28.0 mmol/L   TCO2 35 (H) 22 - 32 mmol/L   O2 Saturation 100.0 %   Acid-Base Excess 9.0 (H) 0.0 - 2.0 mmol/L   Sodium 142 135 - 145 mmol/L   Potassium 3.9 3.5 - 5.1 mmol/L   Calcium, Ion 1.22 1.15 - 1.40 mmol/L   HCT 24.0 (L) 36.0 - 46.0 %   Hemoglobin 8.2 (L) 12.0 - 15.0 g/dL   Patient temperature 36.7 C    Collection site ARTERIAL LINE    Drawn by Operator    Sample type ARTERIAL   Glucose, capillary     Status: Abnormal   Collection Time: 11/05/2019 12:05 AM  Result Value Ref Range   Glucose-Capillary 168 (H) 70 - 99 mg/dL    Comment: Glucose reference range applies only to samples taken after fasting for at least 8 hours.  APTT     Status: Abnormal   Collection Time: 11/03/2019  4:06 AM  Result Value Ref Range   aPTT 63 (H) 24 - 36 seconds    Comment:        IF BASELINE aPTT IS ELEVATED, SUGGEST PATIENT RISK ASSESSMENT BE USED  TO DETERMINE APPROPRIATE ANTICOAGULANT THERAPY. Performed at [facility], [place] [address] [place], [place] [address]   CBC     Status: Abnormal   Collection Time: 10/20/2019  4:06 AM  Result Value Ref Range   WBC 4.2 4.0 - 10.5 K/uL   RBC 3.09 (L) 3.87 - 5.11 MIL/uL   Hemoglobin 9.3 (L) 12.0 - 15.0 g/dL   HCT 27.8 (L) 36.0 - 46.0 %   MCV 90.0 80.0 - 100.0 fL   MCH 30.1 26.0 - 34.0 pg   MCHC 33.5 30.0 - 36.0 g/dL   RDW 16.1 (H) 11.5 - 15.5 %   Platelets 33 (L) 150 - 400 K/uL    Comment: CONSISTENT WITH PREVIOUS RESULT Immature Platelet Fraction may be clinically indicated, consider ordering this additional test [id number]    nRBC 1.4 (H) 0.0 - 0.2 %    Comment: Performed at [facility], [place] [address] [place], [place] [address]  Basic metabolic panel     Status: Abnormal   Collection Time: 10/31/2019  4:06 AM  Result Value Ref Range   Sodium 143 135 - 145 mmol/L   Potassium 4.1 3.5 - 5.1 mmol/L   Chloride 104 98 - 111 mmol/L   CO2 28 22 - 32 mmol/L   Glucose, Bld 235 (H) 70 - 99 mg/dL    Comment: Glucose reference range applies only to samples taken after fasting for at least 8 hours.   BUN 40 (H) 6 - 20 mg/dL   Creatinine, Ser 2.48 (H) 0.44 - 1.00 mg/dL   Calcium 8.5 (L) 8.9 - 10.3 mg/dL   GFR calc non Af Amer 23 (L) >60 mL/min   GFR calc Af Amer 26 (L) >60 mL/min   Anion gap 11 5 - 15    Comment: Performed at [facility] [address] [place], [place] [address]  Lactate dehydrogenase     Status: Abnormal   Collection Time: 11/04/2019  4:06 AM  Result Value Ref Range   LDH 436 (H) 98 - 192 U/L    Comment: Performed at [facility], [place] [address], [place], [place] [address]  Protime-INR     Status: None   Collection Time: 10/19/2019  4:06 AM  Result Value Ref Range   Prothrombin Time 13.7 11.4 - 15.2 seconds   INR 1.1 0.8 - 1.2    Comment: (NOTE) INR goal varies based on device and disease states. Performed at [facility], [place]  [address], [place], [place] [address]   Fibrinogen     Status: Abnormal   Collection Time: 11/07/2019  4:06 AM  Result Value Ref Range   Fibrinogen 552 (H) 210 - 475 mg/dL    Comment: Performed at [facility] [address], [place], [place] [address]  Hepatic function panel     Status: Abnormal   Collection Time: 10/24/2019  4:06 AM  Result Value Ref Range   Total Protein 4.3 (L) 6.5 - 8.1 g/dL   Albumin 2.6 (L) 3.5 - 5.0 g/dL   AST 60 (H) 15 - 41 U/L   ALT 58 (H) 0 - 44 U/L   Alkaline Phosphatase 94 38 - 126 U/L   Total Bilirubin 1.6 (H) 0.3 - 1.2 mg/dL   Bilirubin, Direct 0.5 (H) 0.0 - 0.2 mg/dL   Indirect Bilirubin 1.1 (H) 0.3 - 0.9 mg/dL    Comment: Performed at [facility] [address] [place], [place] [address]  Heparin level (unfractionated)     Status: Abnormal   Collection Time: 11/09/2019  4:06 AM  Result Value Ref Range   Heparin Unfractionated <0.10 (L) 0.30 - 0.70 IU/mL  Comment: (NOTE) If heparin results are below expected values, and patient dosage has  been confirmed, suggest follow up testing of antithrombin III levels. Performed at [facility], [place] [address], [place], [place] [address]   Procalcitonin     Status: None   Collection Time: 10/31/2019  4:06 AM  Result Value Ref Range   Procalcitonin 4.09 ng/mL    Comment:        Interpretation: PCT > 2 ng/mL: Systemic infection (sepsis) is likely, unless other causes are known. (NOTE)       Sepsis PCT Algorithm           Lower Respiratory Tract                                      Infection PCT Algorithm    ----------------------------     ----------------------------         PCT < 0.25 ng/mL                PCT < 0.10 ng/mL         Strongly encourage             Strongly discourage   discontinuation of antibiotics    initiation of antibiotics    ----------------------------     -----------------------------       PCT 0.25 - 0.50 ng/mL            PCT 0.10 - 0.25 ng/mL               OR        >80% decrease in PCT            Discourage initiation of                                            antibiotics      Encourage discontinuation           of antibiotics    ----------------------------     -----------------------------         PCT >= 0.50 ng/mL              PCT 0.26 - 0.50 ng/mL               AND       <80% decrease in PCT              Encourage initiation of                                             antibiotics       Encourage continuation           of antibiotics    ----------------------------     -----------------------------        PCT >= 0.50 ng/mL                  PCT > 0.50 ng/mL               AND         increase in PCT                  Strongly encourage  initiation of antibiotics    Strongly encourage escalation           of antibiotics                                     -----------------------------                                           PCT <= 0.25 ng/mL                                                 OR                                        > 80% decrease in PCT                                     Discontinue / Do not initiate                                             antibiotics Performed at [facility], [address] [place], [place] [address]   Triglycerides     Status: None   Collection Time: 11/07/2019  4:06 AM  Result Value Ref Range   Triglycerides 126 <150 mg/dL    Comment: Performed at [facility] [address] [place], [place] [address]  Glucose, capillary     Status: Abnormal   Collection Time: 11/07/2019  4:08 AM  Result Value Ref Range   Glucose-Capillary 235 (H) 70 - 99 mg/dL    Comment: Glucose reference range applies only to samples taken after fasting for at least 8 hours.  I-STAT 7, (LYTES, BLD GAS, ICA, H+H)     Status: Abnormal   Collection Time: 11/13/2019  6:13 AM  Result Value Ref Range   pH, Arterial 7.414 7.350 - 7.450   pCO2 arterial 48.2 (H) 32.0 - 48.0 mmHg    pO2, Arterial 266.0 (H) 83.0 - 108.0 mmHg   Bicarbonate 30.9 (H) 20.0 - 28.0 mmol/L   TCO2 32 22 - 32 mmol/L   O2 Saturation 100.0 %   Acid-Base Excess 6.0 (H) 0.0 - 2.0 mmol/L   Sodium 141 135 - 145 mmol/L   Potassium 3.8 3.5 - 5.1 mmol/L   Calcium, Ion 1.18 1.15 - 1.40 mmol/L   HCT 24.0 (L) 36.0 - 46.0 %   Hemoglobin 8.2 (L) 12.0 - 15.0 g/dL   Patient temperature 36.8 C    Collection site ARTERIAL LINE    Drawn by Operator    Sample type ARTERIAL   Glucose, capillary     Status: Abnormal   Collection Time: 11/05/2019  7:53 AM  Result Value Ref Range   Glucose-Capillary 229 (H) 70 - 99 mg/dL    Comment: Glucose reference range applies only to samples taken after fasting for at least 8 hours.  APTT     Status: Abnormal   Collection Time: 10/28/2019  9:46 AM  Result Value Ref Range   aPTT 57 (H) 24 - 36 seconds    Comment:        IF BASELINE aPTT IS ELEVATED, SUGGEST PATIENT RISK ASSESSMENT BE USED TO DETERMINE APPROPRIATE ANTICOAGULANT THERAPY. Performed at [facility], [place] [address], [place], [place] [address]   Prepare fresh frozen plasma     Status: None   Collection Time: 11/04/2019 10:06 AM  Result Value Ref Range   Unit Number [id number]    Blood Component Type THAWED PLASMA    Unit division 00    Status of Unit REL FROM [facility]    Transfusion Status OK TO TRANSFUSE    Unit Number [id number]    Blood Component Type THAWED PLASMA    Unit division 00    Status of Unit ISSUED,FINAL    Transfusion Status      OK TO TRANSFUSE Performed at [facility], [place] [address] [place], [place] [address]   Prepare platelet pheresis     Status: None   Collection Time: 11/08/2019 10:06 AM  Result Value Ref Range   Unit Number [id number]    Blood Component Type PLTPHER LR1    Unit division 00    Status of Unit ISSUED,FINAL    Transfusion Status OK TO TRANSFUSE    Unit Number [id number]    Blood Component Type PLTPHER LR2    Unit division 00    Status of  Unit ISSUED,FINAL    Transfusion Status      OK TO TRANSFUSE Performed at [facility], [place] [address], [place],  [address]   I-STAT 7, (LYTES, BLD GAS, ICA, H+H)     Status: Abnormal   Collection Time: 10/15/2019 10:47 AM  Result Value Ref Range   pH, Arterial 7.407 7.350 - 7.450   pCO2 arterial 53.2 (H) 32.0 - 48.0 mmHg   pO2, Arterial 425.0 (H) 83.0 - 108.0 mmHg   Bicarbonate 33.5 (H) 20.0 - 28.0 mmol/L   TCO2 35 (H) 22 - 32 mmol/L   O2 Saturation 100.0 %   Acid-Base Excess 8.0 (H) 0.0 - 2.0 mmol/L   Sodium 143 135 - 145 mmol/L   Potassium 4.1 3.5 - 5.1 mmol/L   Calcium, Ion 1.15 1.15 - 1.40 mmol/L   HCT 26.0 (L) 36.0 - 46.0 %   Hemoglobin 8.8 (L) 12.0 - 15.0 g/dL   Patient temperature HIDE    Sample type ARTERIAL   I-STAT 7, (LYTES, BLD GAS, ICA, H+H)     Status: Abnormal   Collection Time: 10/30/2019 11:27 AM  Result Value Ref Range   pH, Arterial 7.366 7.350 - 7.450   pCO2 arterial 51.8 (H) 32.0 - 48.0 mmHg   pO2, Arterial 230.0 (H) 83.0 - 108.0 mmHg   Bicarbonate 29.9 (H) 20.0 - 28.0 mmol/L   TCO2 31 22 - 32 mmol/L   O2 Saturation 100.0 %   Acid-Base Excess 4.0 (H) 0.0 - 2.0 mmol/L   Sodium 143 135 - 145 mmol/L   Potassium 4.1 3.5 - 5.1 mmol/L   Calcium, Ion 1.20 1.15 - 1.40 mmol/L   HCT 33.0 (L) 36.0 - 46.0 %   Hemoglobin 11.2 (L) 12.0 - 15.0 g/dL   Patient temperature 36.4 C    Sample type CARDIOPULMONARY BYPASS   I-STAT 7, (LYTES, BLD GAS, ICA, H+H)     Status: Abnormal   Collection Time: 11/09/2019  11:45 AM  Result Value Ref Range   pH, Arterial 7.371 7.350 - 7.450   pCO2 arterial 50.0 (H) 32.0 - 48.0 mmHg   pO2, Arterial 175.0 (H) 83.0 - 108.0 mmHg   Bicarbonate 29.2 (H) 20.0 - 28.0 mmol/L   TCO2 31 22 - 32 mmol/L   O2 Saturation 100.0 %   Acid-Base Excess 3.0 (H) 0.0 - 2.0 mmol/L   Sodium 143 135 - 145 mmol/L   Potassium 4.1 3.5 - 5.1 mmol/L   Calcium, Ion 1.16 1.15 - 1.40 mmol/L   HCT 33.0 (L) 36.0 - 46.0 %   Hemoglobin 11.2 (L) 12.0 - 15.0  g/dL   Patient temperature 36.4 C    Sample type CARDIOPULMONARY BYPASS   I-STAT 7, (LYTES, BLD GAS, ICA, H+H)     Status: Abnormal   Collection Time: 11/04/2019 12:28 PM  Result Value Ref Range   pH, Arterial 7.287 (L) 7.350 - 7.450   pCO2 arterial 56.8 (H) 32.0 - 48.0 mmHg   pO2, Arterial 46.0 (L) 83.0 - 108.0 mmHg   Bicarbonate 27.1 20.0 - 28.0 mmol/L   TCO2 29 22 - 32 mmol/L   O2 Saturation 76.0 %   Sodium 142 135 - 145 mmol/L   Potassium 3.8 3.5 - 5.1 mmol/L   Calcium, Ion 0.79 (LL) 1.15 - 1.40 mmol/L   HCT 29.0 (L) 36.0 - 46.0 %   Hemoglobin 9.9 (L) 12.0 - 15.0 g/dL   Patient temperature HIDE    Sample type ARTERIAL    Comment NOTIFIED PHYSICIAN   I-STAT 7, (LYTES, BLD GAS, ICA, H+H)     Status: Abnormal   Collection Time: 10/25/2019 12:40 PM  Result Value Ref Range   pH, Arterial 7.392 7.350 - 7.450   pCO2 arterial 45.7 32.0 - 48.0 mmHg   pO2, Arterial 119.0 (H) 83.0 - 108.0 mmHg   Bicarbonate 27.9 20.0 - 28.0 mmol/L   TCO2 29 22 - 32 mmol/L   O2 Saturation 99.0 %   Acid-Base Excess 2.0 0.0 - 2.0 mmol/L   Sodium 142 135 - 145 mmol/L   Potassium 3.9 3.5 - 5.1 mmol/L   Calcium, Ion 1.09 (L) 1.15 - 1.40 mmol/L   HCT 32.0 (L) 36.0 - 46.0 %   Hemoglobin 10.9 (L) 12.0 - 15.0 g/dL   Patient temperature 36.4 C    Sample type CARDIOPULMONARY BYPASS   CBC     Status: Abnormal   Collection Time: 10/20/2019  1:48 PM  Result Value Ref Range   WBC 6.0 4.0 - 10.5 K/uL   RBC 3.58 (L) 3.87 - 5.11 MIL/uL   Hemoglobin 10.8 (L) 12.0 - 15.0 g/dL   HCT 32.1 (L) 36.0 - 46.0 %   MCV 89.7 80.0 - 100.0 fL   MCH 30.2 26.0 - 34.0 pg   MCHC 33.6 30.0 - 36.0 g/dL   RDW 15.5 11.5 - 15.5 %   Platelets 126 (L) 150 - 400 K/uL    Comment: SPECIMEN CHECKED FOR CLOTS PLATELET COUNT CONFIRMED BY SMEAR REPEATED TO VERIFY    nRBC 0.8 (H) 0.0 - 0.2 %    Comment: Performed at [facility], [place] [address], [place], [place] [address]  Protime-INR     Status: None   Collection Time: 11/01/2019  1:48  PM  Result Value Ref Range   Prothrombin Time 14.2 11.4 - 15.2 seconds   INR 1.1 0.8 - 1.2    Comment: (NOTE) INR goal varies based on device and disease states. Performed  at [facility], [place] [address], [place], [place] [address]   APTT     Status: Abnormal   Collection Time: 11/11/2019  1:48 PM  Result Value Ref Range   aPTT 40 (H) 24 - 36 seconds    Comment:        IF BASELINE aPTT IS ELEVATED, SUGGEST PATIENT RISK ASSESSMENT BE USED TO DETERMINE APPROPRIATE ANTICOAGULANT THERAPY. Performed at [facility], [place] [address], [place], [place] [address]   .Cooxemetry Panel (carboxy, met, total hgb, O2 sat)     Status: Abnormal   Collection Time: 11/13/2019  1:51 PM  Result Value Ref Range   Total hemoglobin 10.9 (L) 12.0 - 16.0 g/dL   O2 Saturation 99.5 %   Carboxyhemoglobin 1.8 (H) 0.5 - 1.5 %   Methemoglobin 1.5 0.0 - 1.5 %    Comment: Performed at [facility] [address], [place], [place] [address]  Glucose, capillary     Status: Abnormal   Collection Time: 11/08/2019  1:51 PM  Result Value Ref Range   Glucose-Capillary 177 (H) 70 - 99 mg/dL    Comment: Glucose reference range applies only to samples taken after fasting for at least 8 hours.  I-STAT 7, (LYTES, BLD GAS, ICA, H+H)     Status: Abnormal   Collection Time: 11/03/2019  1:54 PM  Result Value Ref Range   pH, Arterial 7.389 7.350 - 7.450   pCO2 arterial 50.9 (H) 32.0 - 48.0 mmHg   pO2, Arterial 201.0 (H) 83.0 - 108.0 mmHg   Bicarbonate 31.0 (H) 20.0 - 28.0 mmol/L   TCO2 33 (H) 22 - 32 mmol/L   O2 Saturation 100.0 %   Acid-Base Excess 5.0 (H) 0.0 - 2.0 mmol/L   Sodium 142 135 - 145 mmol/L   Potassium 4.0 3.5 - 5.1 mmol/L   Calcium, Ion 1.17 1.15 - 1.40 mmol/L   HCT 30.0 (L) 36.0 - 46.0 %   Hemoglobin 10.2 (L) 12.0 - 15.0 g/dL   Patient temperature 36.0 C    Sample type ARTERIAL   .Cooxemetry Panel (carboxy, met, total hgb, O2 sat)     Status: Abnormal   Collection Time: 10/20/2019  2:57 PM   Result Value Ref Range   Total hemoglobin 9.9 (L) 12.0 - 16.0 g/dL   O2 Saturation 87.6 %   Carboxyhemoglobin 2.0 (H) 0.5 - 1.5 %   Methemoglobin 1.7 (H) 0.0 - 1.5 %    Comment: Performed at [facility] [address], [place], [place] [address]  Glucose, capillary     Status: Abnormal   Collection Time: 11/06/2019  4:10 PM  Result Value Ref Range   Glucose-Capillary 139 (H) 70 - 99 mg/dL    Comment: Glucose reference range applies only to samples taken after fasting for at least 8 hours.  CBC     Status: Abnormal   Collection Time: 10/29/2019  6:33 PM  Result Value Ref Range   WBC 6.6 4.0 - 10.5 K/uL   RBC 3.80 (L) 3.87 - 5.11 MIL/uL   Hemoglobin 11.2 (L) 12.0 - 15.0 g/dL   HCT 33.7 (L) 36.0 - 46.0 %   MCV 88.7 80.0 - 100.0 fL   MCH 29.5 26.0 - 34.0 pg   MCHC 33.2 30.0 - 36.0 g/dL   RDW 15.9 (H) 11.5 - 15.5 %   Platelets 107 (L) 150 - 400 K/uL    Comment: SPECIMEN CHECKED FOR CLOTS Immature Platelet Fraction may be clinically indicated,  consider ordering this additional test [id number] PLATELET COUNT CONFIRMED BY SMEAR REPEATED TO VERIFY    nRBC 1.1 (H) 0.0 - 0.2 %    Comment: Performed at [facility], [place] [address] [place], [place] [address]  Basic metabolic panel     Status: Abnormal   Collection Time: 11/05/2019  6:33 PM  Result Value Ref Range   Sodium 142 135 - 145 mmol/L   Potassium 4.1 3.5 - 5.1 mmol/L   Chloride 104 98 - 111 mmol/L   CO2 26 22 - 32 mmol/L   Glucose, Bld 104 (H) 70 - 99 mg/dL    Comment: Glucose reference range applies only to samples taken after fasting for at least 8 hours.   BUN 42 (H) 6 - 20 mg/dL   Creatinine, Ser 2.41 (H) 0.44 - 1.00 mg/dL   Calcium 8.2 (L) 8.9 - 10.3 mg/dL   GFR calc non Af Amer 23 (L) >60 mL/min   GFR calc Af Amer 27 (L) >60 mL/min   Anion gap 12 5 - 15    Comment: Performed at [facility] [address] [place], [place] [address]  Magnesium     Status: None   Collection Time: 10/27/2019  6:33 PM   Result Value Ref Range   Magnesium 2.4 1.7 - 2.4 mg/dL    Comment: Performed at [facility], [place] [address] [place], [place] [address]  I-STAT 7, (LYTES, BLD GAS, ICA, H+H)     Status: Abnormal   Collection Time: 10/25/2019  6:34 PM  Result Value Ref Range   pH, Arterial 7.364 7.350 - 7.450   pCO2 arterial 50.2 (H) 32.0 - 48.0 mmHg   pO2, Arterial 97.0 83.0 - 108.0 mmHg   Bicarbonate 28.6 (H) 20.0 - 28.0 mmol/L   TCO2 30 22 - 32 mmol/L   O2 Saturation 97.0 %   Acid-Base Excess 3.0 (H) 0.0 - 2.0 mmol/L   Sodium 142 135 - 145 mmol/L   Potassium 4.1 3.5 - 5.1 mmol/L   Calcium, Ion 1.17 1.15 - 1.40 mmol/L   HCT 32.0 (L) 36.0 - 46.0 %   Hemoglobin 10.9 (L) 12.0 - 15.0 g/dL   Patient temperature 37.1 C    Sample type ARTERIAL   Glucose, capillary     Status: None   Collection Time: 11/08/2019  7:51 PM  Result Value Ref Range   Glucose-Capillary 79 70 - 99 mg/dL    Comment: Glucose reference range applies only to samples taken after fasting for at least 8 hours.  Glucose, capillary     Status: Abnormal   Collection Time: 10/27/19 12:07 AM  Result Value Ref Range   Glucose-Capillary 36 (LL) 70 - 99 mg/dL    Comment: Glucose reference range applies only to samples taken after fasting for at least 8 hours.   Comment 1 Notify RN   Glucose, capillary     Status: Abnormal   Collection Time: 10/27/19 12:27 AM  Result Value Ref Range   Glucose-Capillary 100 (H) 70 - 99 mg/dL    Comment: Glucose reference range applies only to samples taken after fasting for at least 8 hours.  Glucose, capillary     Status: Abnormal   Collection Time: 10/27/19  2:11 AM  Result Value Ref Range   Glucose-Capillary 49 (L) 70 - 99 mg/dL    Comment: Glucose reference range applies only to samples taken after fasting for at least 8 hours.  Glucose, capillary  Status: Abnormal   Collection Time: 10/27/19  2:32 AM  Result Value Ref Range   Glucose-Capillary 131 (H) 70 - 99 mg/dL    Comment: Glucose  reference range applies only to samples taken after fasting for at least 8 hours.  Glucose, capillary     Status: None   Collection Time: 10/27/19  3:57 AM  Result Value Ref Range   Glucose-Capillary 75 70 - 99 mg/dL    Comment: Glucose reference range applies only to samples taken after fasting for at least 8 hours.  Protime-INR     Status: None   Collection Time: 10/27/19  4:12 AM  Result Value Ref Range   Prothrombin Time 14.8 11.4 - 15.2 seconds   INR 1.2 0.8 - 1.2    Comment: (NOTE) INR goal varies based on device and disease states. Performed at [facility], [place] [address] [place], [place] [address]   Hepatic function panel     Status: Abnormal   Collection Time: 10/27/19  4:12 AM  Result Value Ref Range   Total Protein 5.3 (L) 6.5 - 8.1 g/dL   Albumin 3.4 (L) 3.5 - 5.0 g/dL   AST 44 (H) 15 - 41 U/L   ALT 36 0 - 44 U/L   Alkaline Phosphatase 102 38 - 126 U/L   Total Bilirubin 1.8 (H) 0.3 - 1.2 mg/dL   Bilirubin, Direct 0.5 (H) 0.0 - 0.2 mg/dL   Indirect Bilirubin 1.3 (H) 0.3 - 0.9 mg/dL    Comment: Performed at [facility] [address], [place], [place] [address]  Procalcitonin     Status: None   Collection Time: 10/27/19  4:12 AM  Result Value Ref Range   Procalcitonin 7.32 ng/mL    Comment:        Interpretation: PCT > 2 ng/mL: Systemic infection (sepsis) is likely, unless other causes are known. (NOTE)       Sepsis PCT Algorithm           Lower Respiratory Tract                                      Infection PCT Algorithm    ----------------------------     ----------------------------         PCT < 0.25 ng/mL                PCT < 0.10 ng/mL         Strongly encourage             Strongly discourage   discontinuation of antibiotics    initiation of antibiotics    ----------------------------     -----------------------------       PCT 0.25 - 0.50 ng/mL            PCT 0.10 - 0.25 ng/mL               OR       >80% decrease in PCT             Discourage initiation of                                            antibiotics      Encourage discontinuation  of antibiotics    ----------------------------     -----------------------------         PCT >= 0.50 ng/mL              PCT 0.26 - 0.50 ng/mL               AND       <80% decrease in PCT              Encourage initiation of                                             antibiotics       Encourage continuation           of antibiotics    ----------------------------     -----------------------------        PCT >= 0.50 ng/mL                  PCT > 0.50 ng/mL               AND         increase in PCT                  Strongly encourage                                      initiation of antibiotics    Strongly encourage escalation           of antibiotics                                     -----------------------------                                           PCT <= 0.25 ng/mL                                                 OR                                        > 80% decrease in PCT                                     Discontinue / Do not initiate                                             antibiotics Performed at [facility], [address], [place], [place] [address]   CBC     Status: Abnormal   Collection Time: 10/27/19  4:12 AM  Result Value Ref Range   WBC 6.7 4.0 - 10.5 K/uL   RBC 3.68 (  L) 3.87 - 5.11 MIL/uL   Hemoglobin 11.1 (L) 12.0 - 15.0 g/dL   HCT 33.2 (L) 36.0 - 46.0 %   MCV 90.2 80.0 - 100.0 fL   MCH 30.2 26.0 - 34.0 pg   MCHC 33.4 30.0 - 36.0 g/dL   RDW 16.1 (H) 11.5 - 15.5 %   Platelets 83 (L) 150 - 400 K/uL    Comment: CONSISTENT WITH PREVIOUS RESULT Immature Platelet Fraction may be clinically indicated, consider ordering this additional test [id number]    nRBC 0.9 (H) 0.0 - 0.2 %    Comment: Performed at [facility], [place] [address], [place], [place] [address]  Magnesium     Status: None   Collection Time: 10/27/19  4:12  AM  Result Value Ref Range   Magnesium 2.3 1.7 - 2.4 mg/dL    Comment: Performed at [facility] [address] [place], [place] [address]  Basic metabolic panel     Status: Abnormal   Collection Time: 10/27/19  4:12 AM  Result Value Ref Range   Sodium 141 135 - 145 mmol/L   Potassium 4.5 3.5 - 5.1 mmol/L   Chloride 103 98 - 111 mmol/L   CO2 26 22 - 32 mmol/L   Glucose, Bld 72 70 - 99 mg/dL    Comment: Glucose reference range applies only to samples taken after fasting for at least 8 hours.   BUN 44 (H) 6 - 20 mg/dL   Creatinine, Ser 2.80 (H) 0.44 - 1.00 mg/dL   Calcium 8.5 (L) 8.9 - 10.3 mg/dL   GFR calc non Af Amer 19 (L) >60 mL/min   GFR calc Af Amer 23 (L) >60 mL/min   Anion gap 12 5 - 15    Comment: Performed at [facility] [address] [place], [place] [address]  I-STAT 7, (LYTES, BLD GAS, ICA, H+H)     Status: Abnormal   Collection Time: 10/27/19  4:21 AM  Result Value Ref Range   pH, Arterial 7.342 (L) 7.350 - 7.450   pCO2 arterial 51.6 (H) 32.0 - 48.0 mmHg   pO2, Arterial 77.0 (L) 83.0 - 108.0 mmHg   Bicarbonate 28.1 (H) 20.0 - 28.0 mmol/L   TCO2 30 22 - 32 mmol/L   O2 Saturation 94.0 %   Acid-Base Excess 2.0 0.0 - 2.0 mmol/L   Sodium 141 135 - 145 mmol/L   Potassium 4.4 3.5 - 5.1 mmol/L   Calcium, Ion 1.17 1.15 - 1.40 mmol/L   HCT 32.0 (L) 36.0 - 46.0 %   Hemoglobin 10.9 (L) 12.0 - 15.0 g/dL   Patient temperature 36.7 C    Collection site ARTERIAL LINE    Drawn by Operator    Sample type ARTERIAL   .Cooxemetry Panel (carboxy, met, total hgb, O2 sat)     Status: Abnormal   Collection Time: 10/27/19  4:36 AM  Result Value Ref Range   Total hemoglobin 11.0 (L) 12.0 - 16.0 g/dL   O2 Saturation 85.4 %   Carboxyhemoglobin 1.5 0.5 - 1.5 %   Methemoglobin 1.2 0.0 - 1.5 %    Comment: Performed at [facility] [address] [place], [place] [address]  Vancomycin, random     Status: None   Collection Time: 10/27/19  5:11 AM  Result Value Ref  Range   Vancomycin Rm 40     Comment:        Random Vancomycin therapeutic range is dependent on dosage and  time of specimen collection. A peak range is 20.0-40.0 ug/mL A trough range is 5.0-15.0 ug/mL        Performed at [facility] [address], [place], [place] [address]   Glucose, capillary     Status: Abnormal   Collection Time: 10/27/19  5:15 AM  Result Value Ref Range   Glucose-Capillary 56 (L) 70 - 99 mg/dL    Comment: Glucose reference range applies only to samples taken after fasting for at least 8 hours.  Glucose, capillary     Status: Abnormal   Collection Time: 10/27/19  5:31 AM  Result Value Ref Range   Glucose-Capillary 109 (H) 70 - 99 mg/dL    Comment: Glucose reference range applies only to samples taken after fasting for at least 8 hours.  I-STAT 7, (LYTES, BLD GAS, ICA, H+H)     Status: Abnormal   Collection Time: 10/27/19  6:26 AM  Result Value Ref Range   pH, Arterial 7.337 (L) 7.350 - 7.450   pCO2 arterial 49.3 (H) 32.0 - 48.0 mmHg   pO2, Arterial 83.0 83.0 - 108.0 mmHg   Bicarbonate 26.5 20.0 - 28.0 mmol/L   TCO2 28 22 - 32 mmol/L   O2 Saturation 95.0 %   Sodium 142 135 - 145 mmol/L   Potassium 4.2 3.5 - 5.1 mmol/L   Calcium, Ion 1.13 (L) 1.15 - 1.40 mmol/L   HCT 31.0 (L) 36.0 - 46.0 %   Hemoglobin 10.5 (L) 12.0 - 15.0 g/dL   Patient temperature 36.6 C    Collection site ARTERIAL LINE    Drawn by Operator    Sample type ARTERIAL   Glucose, capillary     Status: None   Collection Time: 10/27/19  8:11 AM  Result Value Ref Range   Glucose-Capillary 74 70 - 99 mg/dL    Comment: Glucose reference range applies only to samples taken after fasting for at least 8 hours.  I-STAT 7, (LYTES, BLD GAS, ICA, H+H)     Status: Abnormal   Collection Time: 10/27/19  9:20 AM  Result Value Ref Range   pH, Arterial 7.406 7.350 - 7.450   pCO2 arterial 41.3 32.0 - 48.0 mmHg   pO2, Arterial 75.0 (L) 83.0 - 108.0 mmHg   Bicarbonate 26.2 20.0 - 28.0 mmol/L    TCO2 27 22 - 32 mmol/L   O2 Saturation 96.0 %   Acid-Base Excess 1.0 0.0 - 2.0 mmol/L   Sodium 140 135 - 145 mmol/L   Potassium 4.1 3.5 - 5.1 mmol/L   Calcium, Ion 1.15 1.15 - 1.40 mmol/L   HCT 31.0 (L) 36.0 - 46.0 %   Hemoglobin 10.5 (L) 12.0 - 15.0 g/dL   Patient temperature 36.0 C    Sample type ARTERIAL   .Cooxemetry Panel (carboxy, met, total hgb, O2 sat)     Status: Abnormal   Collection Time: 10/27/19  9:57 AM  Result Value Ref Range   Total hemoglobin 10.6 (L) 12.0 - 16.0 g/dL   O2 Saturation 86.5 %   Carboxyhemoglobin 1.6 (H) 0.5 - 1.5 %   Methemoglobin 1.4 0.0 - 1.5 %    Comment: Performed at [facility] [address] [place], [place] [address]     ROS:  Review of systems not obtained due to patient factors.  Physical Exam: Vitals:   10/27/19 0725 10/27/19 1115  BP: 107/61 112/66  Pulse: (!) 113 (!) 115  Resp: (!) 25 (!) 25  Temp: 97.9 F (36.6 C) (!)  97.5 F (36.4 C)  SpO2: 96% 97%     General: Sedated, intubated HEENT: Eyes are bloodshot-reactive, mucous membranes are moist Neck: No JVD appreciated Heart: Central lines in a packed sternal wound-tachycardic Lungs: Coarse breath sounds bilaterally Abdomen: Distended, minimal bowel sounds Extremities: Pitting edema throughout Skin: Warm and dry Neuro: Sedated  Assessment/Plan: 49 year old black female with lupus and lupus nephritis-fairly recent discovery of pulmonary hypertension and tricuspid valve dysfunction. 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 Now, she has  suffered acute on chronic renal failure this most likely hemodynamically mediated in the setting of cardiovascular surgery with complications requiring ECMO now on pressor support. Renal function and urine output seemed to decline after decannulation took place on 4/12. She  is volume overloaded. I agree with efforts to try to augment urine output using Lasix and Zaroxolyn. She is not on maximum dose, I have chosen to increase Lasix to 20 mg/h and will give another dose of Zaroxolyn and dose it daily. There are no absolute needs for dialysis at this time but I agree that it is concerning with low urine output and volume status that if she does not respond in the next 12 to 24 hours she will likely require dialysis support. I have discussed this with the husband he is aware. We are all hoping it would not be a permanent situation 2. Hypertension/volume  -hypotensive on pressors but with volume overload. Attempting to wean pressors as able. Using high-dose diuretics to augment urine output as able 3. Anemia  -in the setting of coagulopathy, bleeding and CKD. Supportive care for now. Last hemoglobin 10.5 4. Status post TValve  repair-Per Dr. Roxy Manns 5. Possible seizure activity-Per neurology   Louis Meckel 10/27/2019, 12:34 PM

## 2019-10-27 NOTE — Progress Notes (Signed)
[address]       [place] [address]             [phone]        CARDIOTHORACIC SURGERY PROGRESS NOTE  6Days Post-opS/P Procedure(s) (LRB): MINIMALLY INVASIVE TRICUSPID VALVE REPAIR USING MC3 TRICUSPID ANNULOPLASTY RING (Right) TRANSESOPHAGEAL ECHOCARDIOGRAM (TEE) (N/A)  6DaysPost-OpS/P Procedure(s) (LRB): Re-exploration of post-operative open heart procedure for bleeding (N/A) Cannulation For Ecmo (Extracorporeal Membrane Oxygenation) (N/A)  5 Days Post-Op Procedure(s) (LRB): EXPLORATION POST OPERATIVE OPEN HEART (N/A) Transesophageal Echocardiogram (Tee) (N/A) Application Of Wound Vac (N/A)   4 Days Post-Op Procedure(s) (LRB): Mediastinal washout and Re-exploration of right chest for evacuation of hemothorax (N/A) Application Of Wound Vac using Abthera dressing. (N/A)   1 Day Post-Op Procedure(s) (LRB): MEDIASTERNAL WASH OUT,  ECMO DECANNULATION AND STERNAL CLOSURE (N/A) TRANSESOPHAGEAL ECHOCARDIOGRAM (TEE) (N/A)  Subjective: Sedated on vent.  No seizure activity reported.  No arrhythmias overnight  Objective: Vital signs: BP Readings from Last 1 Encounters:  10/27/19 107/61   Pulse Readings from Last 1 Encounters:  10/27/19 (!) 113   Resp Readings from Last 1 Encounters:  10/27/19 (!) 25   Temp Readings from Last 1 Encounters:  10/27/19 97.9 F (36.6 C)    Hemodynamics: CVP:  [14 mmHg-27 mmHg] 19 mmHg  CVP personally done at bedside - 15 mmHg Mixed venous co-ox 85%   Physical Exam:  Rhythm:   Sinus tach  Breath sounds: Coarse but clear  Heart sounds:  distand  Incisions:  Dressings intact  Abdomen:  Soft, non-distended, quiet  Extremities:  Warm, well-perfused  Chest tubes:  low volume thin serosanguinous output, no air leak    Intake/Output from previous day: 04/12 0701 - 04/13 0700 In: 7167.5 [I.V.:3343.3; Blood:1825; NG/GT:328.3; IV Piggyback:1670.9] Out: 2090 [Urine:710; Emesis/NG output:250; Blood:700;  Chest Tube:430] Intake/Output this shift: No intake/output data recorded.  Lab Results:  CBC: Recent Labs    11/03/2019 1833 10/16/2019 1834 10/27/19 0412 10/27/19 0412 10/27/19 0421 10/27/19 0626  WBC 6.6  --  6.7  --   --   --   HGB 11.2*   < > 11.1*   < > 10.9* 10.5*  HCT 33.7*   < > 33.2*   < > 32.0* 31.0*  PLT 107*  --  83*  --   --   --    < > = values in this interval not displayed.    BMET:  Recent Labs    10/17/2019 1833 11/06/2019 1834 10/27/19 0412 10/27/19 0412 10/27/19 0421 10/27/19 0626  NA 142   < > 141   < > 141 142  K 4.1   < > 4.5   < > 4.4 4.2  CL 104  --  103  --   --   --   CO2 26  --  26  --   --   --   GLUCOSE 104*  --  72  --   --   --   BUN 42*  --  44*  --   --   --   CREATININE 2.41*  --  2.80*  --   --   --   CALCIUM 8.2*  --  8.5*  --   --   --    < > = values in this interval not displayed.     PT/INR:   Recent Labs    10/27/19 0412  LABPROT 14.8  INR 1.2    CBG (last 3)  Recent Labs    10/27/19 0357 10/27/19 0515 10/27/19 0531  GLUCAP 75 56* 109*    ABG    Component Value Date/Time   PHART 7.337 (L) 10/27/2019 0626   PCO2ART 49.3 (H) 10/27/2019 0626   PO2ART 83.0 10/27/2019 0626   HCO3 26.5 10/27/2019 0626   TCO2 28 10/27/2019 0626   ACIDBASEDEF 1.0 11/12/2019 1708   O2SAT 95.0 10/27/2019 0626    CXR: Stable right lung opacity  Assessment/Plan: S/P Procedure(s) (LRB): MEDIASTERNAL WASH OUT,  ECMO DECANNULATION AND STERNAL CLOSURE (N/A) TRANSESOPHAGEAL ECHOCARDIOGRAM (TEE) (N/A)  CV:  Stable hemodynamics overnight w/ cardiac index 1.9 CVP 14-15 on milrinone 0.5 and nitric oxide 30 ppm but requiring low dose levophed for BP support - maintaining stable sinus tach vs Aflutter w/ HR 110 on IV amiodarone  RESP:  Stable on vent PRVC w/ O2 sats 96% on 50% FiO2, compensated ABG and CXR stable  RENAL: Acute oliguric renal failure - UOP low overnight and creatinine up 2.8 - may need CVVHD initiated unless UOP improves with  increased BP and lasix challenge  NEURO: Underlying neuro status remains unclear - no reported seizure activity last 24 hours on high dose sedation - consider weaning sedation today  FEN:  High volume gastric residual yesterday likely due to ileus - will restart tube feeds at low rate and await return of bowel function.  Consider TNA if ileus persists.  ID:  No clinical signs of infection - on Vanc and Merren per ECMO protocol   Rexene Alberts, MD 10/27/2019 8:15 AM

## 2019-10-27 NOTE — Progress Notes (Addendum)
Seen and examined in f/u for multifactorial shock after valve replacement.  Decannulated yesterday.  Received 3 units plts intra-op. Remains on burst-suppression  Obtunded on vent, chest tubes in place, diffuse anasraca, midline VAC in place CXR with stable R>L airspace disease.   ABG benign Plat 25, PEEP 5, DP 20 Now more oliguric, Cr going up Remains on iNO Hgb stable Plts 107>>83  A: -Multifactorial (hemorrhagic, cardiac) shock after TV replacement.  Improved, off [facility] ECMO. 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 - D10, hopefully can start increasing TF soon - Seizure suppression per neurology, probably needs MRI at some point, defer timing to neurology - Diuretics vs. UF per TCTS and HF teams - Transfusion thresholds and AC per TCTS - If runs into trouble with plts, agree with using IVIG PRN  35 minutes critical care time Erskine Emery MD PCCM    NAME:  [name], MRN:  [id number], DOB:  1970/11/02, LOS: 6 ADMISSION DATE:  10/27/2019, CONSULTATION DATE:  10/27/2019  REFERRING MD:  Rexene Alberts, MD, CHIEF COMPLAINT:  Vent management.  History of present illness   The patient is a 49 year old woman who is postoperative day 3 from minimally invasive tricuspid valve repair which was then converted into an open procedure.  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  Being followed by the heart failure team for evaluation of  pulmonary arterial hypertension, likely group 1.  However despite echocardiograms showing severely elevated RA pressures, she has had 2 right heart cath that demonstrated normal peripheral vascular resistance for Group 1 pulmonary arterial hypertension.  She was taken to the OR by Dr. Roxy Manns on 4/7 given her persistent symptoms for repair of severe tricuspid regurgitation.  4/8, she had reexploration of her median sternotomy with concern for postoperative bleeding.  There was a large amount of clot evacuated from the anterior mediastinum and significant oozing diffusely.  Does not appear that there were any significant signs for mechanical active bleeding.  Bili at that time she was cannulated for ECMO and started on [place] ECMO on 4/8.  Since then she has had substantial correction of her coagulopathy including Kcentra, FEIBA, cryoprecipitate, FFP and copious blood products given her substantial bleeding in the mediastinum.  She went for decannulation from [facility] ECMO on 4/12.  PCCM is being consulted to assist with vent management.  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   Consults:  Heart failure, PCCM  Procedures:  4/7 tricuspid valve repair through median sternotomy 4/8 reexploration of the mediastinum with cannulation for ECMO 4/12 decannulation from [facility] ECMO   Significant Diagnostic Tests:  Transesophageal echocardiogram January 2021  1. Left ventricular ejection fraction, by visual estimation, is 60 to  65%. The left ventricle has normal function. There is no left ventricular  hypertrophy.  2. The left ventricle has no regional wall  motion abnormalities.  3. Global right ventricle has mildly reduced systolic function.The right  ventricular size is normal. No increase in right ventricular wall  thickness.  4. Left atrial size was moderately dilated.  5. Right atrial size was severely dilated.  6. The mitral valve is normal in structure. Trivial mitral valve  regurgitation.  7. The tricuspid valve is abnormal.  8. The tricuspid valve is abnormal. Tricuspid valve regurgitation is  severe.  9. Apparent mild restriction of septal leaflet. RVSP ~ 23mmHG.  10. The aortic valve is normal in structure. Aortic valve regurgitation is  not visualized.  11. The pulmonic valve was grossly normal. Pulmonic valve regurgitation is  not visualized.  12. Mild plaque invoving the descending aorta.  13. Severely elevated pulmonary artery systolic pressure.   Right heart catheterization June 2020 Findings:  RA = 5 RV = 34/7 PA =  34/6 (20) PCW = 8 Fick cardiac output/index = 5.9/3.1 PVR = 2.0 WU Ao sat = 100% PA sat = 71%, 72% SVC sat = 66%  Assessment: 1. Minimally elevated PA pressures with normal PVR and cardiac output 2. No evidence of significant intracardiac shunt  Micro Data:  None  Antimicrobials:  Levofloxacin 4/7 >> 4/8 Vancomycin 4/7 >> current Meropenem 4/9 >> current   Subjective:  Decannulated from [facility] ECMO on 4/12.  Currently in burst suppression. Neurology plans to wean sedation after 24 hours and obtain MRI.  Oligouric on Furosemide ggt 12 mg/hr.  Remains on Milrinone 0.5 mcg/hr and Levophed 7 mcg/hr.   Objective   Blood pressure 93/62, pulse (!) 107, temperature 97.9 F (36.6 C), resp. rate (!) 22, height 5\' 7"  (1.702 m), weight 98.5 kg, last menstrual period 04/02/2017, SpO2 96 %. CVP:  [14 mmHg-27 mmHg] 19 mmHg  Vent Mode: PRVC FiO2 (%):  [40 %-100 %] 50 % Set Rate:  [12 bmp-22 bmp] 22 bmp Vt Set:  [100 mL-400 mL] 100 mL PEEP:  [5 cmH20] 5 cmH20 Plateau Pressure:  [18 cmH20-27  cmH20] 18 cmH20   Intake/Output Summary (Last 24 hours) at 10/27/2019 0630 Last data filed at 10/27/2019 0600 Gross per 24 hour  Intake 7019.45 ml  Output 2090 ml  Net 4929.45 ml   Filed Weights   10/25/19 0645 11/12/2019 0545 10/27/19 0500  Weight: 92.4 kg 93.5 kg 98.5 kg   Examination: GEN: Obese female, sedated  HEENT: ETT in place, minimal secretions CV: RRR PULM: Diminished bases, passive on vent GI: Soft, hypoactive BS EXT: Global anasarca NEURO: Sedated  ABG 7.34/51/77  Net +22.1L since admission, +4.8L over last 24 hours on Furosemide gtt 12mg /hr Renal function increased slightly with improvement in AST/ALT Hypoglycemia  Resolved Hospital Problem list   None  Assessment & Plan:  [name] is a 49 y.o. woman with history of SLE with thrombocytopenia, nephritis, AVN and possible mild chronic RV failure who presented for repair of severe symptomatic tricuspid regurgitation on 4/7.    Acute hypoxemic respiratory failure - Continue vent support - Increase minute ventilation given respiratory acidosis (RR 22 -> 24)  - Usual VAP prevention measures  Cardiogenic Shock - V/A ecmo 4/8-4/12 - Net +22.1L since admission, +4.8L over last 24 hours on Furosemide gtt 12mg /hr - On milrinone 0.5 mcg/hr and levo 7 mcg/hr  - May need to consult nephrology for consideration of CRRT; however, given RV failure she likely would not tolerate long term HD.  - Diuresis, AC, and inotropes per CHF/TCTS  teams  SLE with immune mediated thrombocytopenia and coagulopathy - Improved. No other clinical evidence of bleeding.  - Monitor with AC resumption  Acute encephalopathy Status Epilepticus  - Currently in burst suppression. Neurology plans to wean sedation after 24 hours and obtain MRI.  - Currently on propofol 40 mcg and versed 20 mg/hr.  - On Vimpat and Keppra per neurology.   Best practice:  Diet: TF with free water Pain/Anxiety/Delirium protocol (if indicated): Propofol and  versed VAP protocol (if indicated): Ordered DVT prophylaxis: heparin gtt GI prophylaxis: Pantoprazole Glucose control: Controlled, blood sugars less than 180 Foley required for critically ill state Mobility: Bedrest Code Status: Full code Family Communication: Per primary team Disposition: Needs ICU  Please see attending attestation for final recommendations.   Ina Homes, MD  [facility] PGY3  Pager: [phone]

## 2019-10-27 NOTE — Anesthesia Postprocedure Evaluation (Signed)
Anesthesia Post Note  Patient: [name]  Procedure(s) Performed: MEDIASTERNAL [place] OUT,  ECMO DECANNULATION AND STERNAL CLOSURE (N/A ) TRANSESOPHAGEAL ECHOCARDIOGRAM (TEE) (N/A )     Patient location during evaluation: SICU Anesthesia Type: General Level of consciousness: sedated and patient remains intubated per anesthesia plan Respiratory status: patient on ventilator - see flowsheet for VS Cardiovascular status: blood pressure returned to baseline Anesthetic complications: no    Last Vitals:  Vitals:   10/27/19 0715 10/27/19 0725  BP:  107/61  Pulse:  (!) 113  Resp: (!) 22 (!) 25  Temp: 36.6 C 36.6 C  SpO2: 96% 96%    Last Pain:  Vitals:   10/27/19 0400  TempSrc:   PainSc: 0-No pain                 Kailynne Ferrington COKER

## 2019-10-27 NOTE — Progress Notes (Addendum)
Subjective: No further seizure-like activity overnight.  ROS: Unable to obtain due to poor mental status  Examination  Vital signs in last 24 hours: Temp:  [84.2 F (29 C)-98.8 F (37.1 C)] 97.5 F (36.4 C) (04/13 1115) Pulse Rate:  [106-115] 115 (04/13 1115) Resp:  [0-25] 25 (04/13 1115) BP: (107-112)/(61-66) 112/66 (04/13 1115) SpO2:  [94 %-100 %] 97 % (04/13 1115) Arterial Line BP: (88-114)/(51-70) 108/63 (04/13 0715) FiO2 (%):  [50 %-100 %] 50 % (04/13 1115) Weight:  [98.5 kg] 98.5 kg (04/13 0500)  General: lying in bed, not in apparent distress CVS: pulse-normal rate, irregular rhythm RS: breathing comfortably, intubated Extremities: Edematous, cold  Neuro: MS: Comatose, does not open eyes to noxious stimuli CN: pupils equal, round and reactive to light, corneal reflex absent, gag reflex absent  Motor: Does not withdraw to noxious stimuli in all 4 extremities, flaccid  Basic Metabolic Panel: Recent Labs  Lab 10/17/2019 1632 10/19/2019 1633 10/30/2019 1752 10/30/2019 1803 10/24/19 0356 10/24/19 0407 10/25/19 0510 10/25/19 0519 10/25/19 1706 10/25/19 1715 10/28/2019 0406 10/25/2019 8119 10/16/2019 1833 10/25/2019 1833 10/17/2019 1834 10/27/19 0412 10/27/19 0421 10/27/19 0626 10/27/19 0920  NA 152*   < > 151*   < > 150*   < > 145   < > 145   < > 143   < > 142   < > 142 141 141 142 140  K 3.7   < > 3.9   < > 4.0   < > 3.9   < > 3.7   < > 4.1   < > 4.1   < > 4.1 4.5 4.4 4.2 4.1  CL 120*   < > 117*   < > 115*   < > 109  --  107  --  104  --  104  --   --  103  --   --   --   CO2 23   < > 25   < > 25   < > 26  --  28  --  28  --  26  --   --  26  --   --   --   GLUCOSE 126*   < > 68*   < > 86   < > 87  --  93  --  235*  --  104*  --   --  72  --   --   --   BUN 15   < > 28*   < > 31*   < > 34*  --  35*  --  40*  --  42*  --   --  44*  --   --   --   CREATININE 1.36*   < > 2.25*   < > 2.49*   < > 2.57*  --  2.30*  --  2.48*  --  2.41*  --   --  2.80*  --   --   --   CALCIUM 6.3*    < > 7.3*   < > 7.7*   < > 8.5*   < > 8.6*   < > 8.5*  --  8.2*  --   --  8.5*  --   --   --   MG 1.6*  --  2.2  --  2.1  --   --   --   --   --   --   --  2.4  --   --  2.3  --   --   --  PHOS  --   --   --   --  6.6*  --   --   --   --   --   --   --   --   --   --   --   --   --   --    < > = values in this interval not displayed.    CBC: Recent Labs  Lab 11/06/2019 1749 11/09/2019 2028 10/25/19 1706 10/25/19 1715 10/24/2019 0406 11/05/2019 6222 11/07/2019 1348 10/28/2019 1354 11/13/2019 1833 11/02/2019 1833 10/15/2019 1834 10/27/19 0412 10/27/19 0421 10/27/19 0626 10/27/19 0920  WBC 3.0*   < > 3.9*  --  4.2  --  6.0  --  6.6  --   --  6.7  --   --   --   NEUTROABS 2.5  --   --   --   --   --   --   --   --   --   --   --   --   --   --   HGB 7.7*   < > 9.5*   < > 9.3*   < > 10.8*   < > 11.2*   < > 10.9* 11.1* 10.9* 10.5* 10.5*  HCT 23.3*   < > 28.8*   < > 27.8*   < > 32.1*   < > 33.7*   < > 32.0* 33.2* 32.0* 31.0* 31.0*  MCV 86.6   < > 89.2  --  90.0  --  89.7  --  88.7  --   --  90.2  --   --   --   PLT 77*   < > 30*  --  33*  --  126*  --  107*  --   --  83*  --   --   --    < > = values in this interval not displayed.     Coagulation Studies: Recent Labs    10/25/19 0510 10/16/2019 0406 11/04/2019 1348 10/27/19 0412  LABPROT 15.1 13.7 14.2 14.8  INR 1.2 1.1 1.1 1.2    Imaging No new brain imaging overnight  ASSESSMENT AND PLAN: 49 year old female with SLE, tricuspid valve repair followed by hemorrhagic shock was on ECMO and was noted to have facial twitching movements on 10/16/2019,  EEG showed focal convulsive status epilepticus.  She was started on Keppra followed by Vimpat and eventually Versed and propofol for burst suppression  Convulsive status epilepticus (resolved) Suspected hypoxic brain injury Acute encephalopathy AKI Anemia Thrombocytopenia Hypoproteinemia with hypoalbuminemia Transaminitis Hyperbilirubinemia - LTM eeg showed evidenceof epilepsyarising from  left frontocentral region. As AED were titrated, eeg improved and showedsevere diffuse encephalopathy, non specific to etiology.No definite seizures were seen. - Given status epilepticus and concern for hypoxic brain injury, will need MRI and neuro exam off sedation for prognostication  Recommendations - Continue Keppra '1000mg'$  BID and Vimpat '100mg'$  BID -Currently on Versed at 20 mL/h and propofol at 40 mcg/hr. We will start weaning Versed at 5 mL/h and propofol at 50mg/ hour -If patient starts having seizures again, can consider adding third antiepileptic like clobazam or phenytoin - Continue LTM EEG to help titration of sedation for seizure management - MRI brain wo contrast to assess for hypoxic/anoxic brain injury after LTM EEG is discontinued - continue seizure precautions - PRN IV versed '5mg'$  for clinical seizure like activity - Management of rest of the comorbidities per primary team - Met with husband at  bedside and updated about neurologic diagnosis and management plan  CRITICAL CARE Performed by: Lora Havens  Total critical care time: 35 minutes  Critical care time was exclusive of separately billable procedures and treating other patients.  Critical care was necessary to treat or prevent imminent or life-threatening deterioration.  Critical care was time spent personally by me on the following activities: development of treatment plan with patient and/or surrogate as well as nursing, discussions with consultants, evaluation of patient's response to treatment, examination of patient, obtaining history from patient or surrogate, ordering and performing treatments and interventions, ordering and review of laboratory studies, ordering and review of radiographic studies, pulse oximetry and re-evaluation of patient's condition.

## 2019-10-27 NOTE — Progress Notes (Signed)
Hypoglycemic Event  CBG: 49  Treatment: D50 50 mL (25 gm)  Symptoms: None/unable to assess  Follow-up CBG: Time:0232 CBG Result:131  Possible Reasons for Event: Unknown  Comments/MD notified:protocol followed    [name]

## 2019-10-27 NOTE — Procedures (Addendum)
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 EEGto evaluate for seizure  Level of alertness:comatose  AEDs during EEG study:keppra, vimpat, propofol, versed  Technical aspects: This EEG study was done with scalp electrodes positioned according to the 10-20 International system of electrode placement. Electrical activity was acquired at a sampling rate of 500Hz  and reviewed with a high frequency filter of 70Hz  and a low frequency filter of 1Hz . EEG data were recorded continuously and digitally stored.  DESCRIPTION:EEG initially showedintermittent generalizedbackground suppressionfor 10-15 seconds alternating with 1-3 seconds of 2-5hz  theta-delta slowing. Sharp waves were seen in left frontocentral region. As sedation was weaned off after around 1430, spikes became more frequent gradually progressing to near continuous spikes by the end. Hyperventilation and photic stimulation were not performed.  ABNORMALITY - Sharp waves. Left frontocentral region - Background suppression, generalized  IMPRESSION: This studyshowed evidence of epilepsy arising from left frontocentral region. Additionally,EEG showedsevere diffuse encephalopathy, non specific to etiology but most likely secondary to sedation.No definite seizures were seen.       Mekiah Wahler Barbra Sarks

## 2019-10-27 NOTE — Anesthesia Postprocedure Evaluation (Signed)
Anesthesia Post Note  Patient: [name]  Procedure(s) Performed: Mediastinal washout and Re-exploration of right chest for evacuation of hemothorax (N/A Chest) Application Of Wound Vac using Abthera dressing. (N/A Chest)     Patient location during evaluation: Other Anesthesia Type: General Level of consciousness: sedated and patient remains intubated per anesthesia plan Pain management: pain level controlled Respiratory status: patient remains intubated per anesthesia plan and patient on ventilator - see flowsheet for VS Cardiovascular status: unstable (remains on ECMO, but hopefully separating from ECMO today. Still requiring hemodynamic support presently) Postop Assessment: no apparent nausea or vomiting    Last Vitals:  Vitals:   10/27/19 1300 10/27/19 1315  BP:    Pulse:    Resp: (!) 26 (!) 26  Temp: (!) 36.2 C (!) 36.2 C  SpO2: 97% 98%    Last Pain:  Vitals:   10/27/19 0800  TempSrc: (P) Bladder  PainSc:                  Seleta Rhymes. Nicolis Boody

## 2019-10-27 NOTE — Care Plan (Signed)
As sedation was bring weaned off, eeg started showing worsening left frontocentral spikes. Therefore, will hold off weaning any further and give IV phenobabr 1500mg  once. Will continue LTM eeg and attempt to wean again tomorrow if possible. RN notified.   [name] [name]

## 2019-10-27 NOTE — Progress Notes (Deleted)
1 

## 2019-10-27 NOTE — Progress Notes (Signed)
Peripherally Inserted Central Catheter Placement  The IV Nurse has discussed with the patient and/or persons authorized to consent for the patient, the purpose of this procedure and the potential benefits and risks involved with this procedure.  The benefits include less needle sticks, lab draws from the catheter, and the patient may be discharged home with the catheter. Risks include, but not limited to, infection, bleeding, blood clot (thrombus formation), and puncture of an artery; nerve damage and irregular heartbeat and possibility to perform a PICC exchange if needed/ordered by physician.  Alternatives to this procedure were also discussed.  Bard Power PICC patient education guide, fact sheet on infection prevention and patient information card has been provided to patient /or left at bedside.    PICC Placement Documentation  PICC Triple Lumen 81/15/72 PICC Left Basilic 45 cm 0 cm (Active)  Indication for Insertion or Continuance of Line Prolonged intravenous therapies 10/27/19 1016  Exposed Catheter (cm) 0 cm 10/27/19 1016  Site Assessment Clean;Dry;Intact 10/27/19 1016  Lumen #1 Status Flushed;Blood return noted;Saline locked 10/27/19 1016  Lumen #2 Status Flushed;Blood return noted;Saline locked 10/27/19 1016  Lumen #3 Status Flushed;Blood return noted;Saline locked 10/27/19 1016  Dressing Type Transparent 10/27/19 1016  Dressing Status Clean;Dry;Intact;Antimicrobial disc in place 10/27/19 1016  Dressing Change Due 11/03/19 10/27/19 1016    Husband signed the consent   Scotty Court 10/27/2019, 10:21 AM

## 2019-10-27 NOTE — Progress Notes (Signed)
Pharmacy Antibiotic Note  [name] is a 49 y.o. female admitted on 11/12/2019 with TV repair. Postop course has been complicated by hemorrhagic shock and [facility] ECMO was initiated later on 4/7. Back to OR for re-exploration and washout on 4/8 and 4/9. Patient remains with open chest. She received vancomycin and levofloxacin for perioperative antibiotics. Pharmacy has been consulted for vancomycin and meropenem dosing while chest is still open. She is now post OR for chest closure and decannulation from ECMO.  SCr up to 3.11 and UOP is dropping off further per RN.  Will adjust Merrem.   Plan: Reduce Meropenem to 1g IV every 12 hours.  Dosing Vancomycin based on levels at this point - no vancomycin today due to level of 40.  Monitor renal function, surgical plans, length of therapy  Height: 5\' 7"  (170.2 cm) Weight: 98.5 kg (217 lb 2.5 oz) IBW/kg (Calculated) : 61.6  Temp (24hrs), Avg:97.1 F (36.2 C), Min:84.2 F (29 C), Max:98.1 F (36.7 C)  Recent Labs  Lab 11/13/2019 0956 10/24/2019 1107 11/09/2019 1838 10/31/2019 1843 11/12/2019 0512 10/28/2019 0735 10/24/19 0356 10/24/19 1708 10/25/19 0510 10/25/19 1108 10/25/19 1706 10/25/19 1706 10/24/2019 0406 11/01/2019 1348 10/28/2019 1833 10/27/19 0412 10/27/19 0511 10/27/19 1651  WBC  --    < >  --    < > 3.6*   < > 5.7   < >   < >  --  3.9*   < > 4.2 6.0 6.6 6.7  --  7.2  CREATININE  --    < >  --    < > 2.04*   < > 2.49*   < >   < >  --  2.30*  --  2.48*  --  2.41* 2.80*  --  3.11*  LATICACIDVEN 3.0*  --  2.3*  --  1.6  --  1.5  --   --   --   --   --   --   --   --   --   --   --   VANCOTROUGH  --   --   --   --   --   --   --   --   --  10*  --   --   --   --   --   --   --   --   VANCORANDOM  --   --   --   --   --   --   --   --   --   --   --   --   --   --   --   --  40  --    < > = values in this interval not displayed.    Estimated Creatinine Clearance: 27.3 mL/min (A) (by C-G formula based on SCr of 3.11 mg/dL (H)).     Allergies  Allergen Reactions  . Lisinopril     angioedema  . Penicillins Hives    DID THE REACTION INVOLVE: Swelling of the face/tongue/throat, SOB, or low BP? Unknown Sudden or severe rash/hives, skin peeling, or the inside of the mouth or nose? Unknown Did it require medical treatment? Unknown When did it last happen?age 80 If all above answers are "NO", may proceed with cephalosporin use.     Antimicrobials this admission: Vancomycin 4/7 >> Levofloxacin 4/7 >> 4/8 Meropenem 4/9 >>  Dose adjustments this admission: 4/11 VT 29: reduced to 750 mg IV q24h 4/13 VT 40 -  Vancomycin held. SCr rising/UOP dropping off - reduced Merrem to 1g IV every 24hrs  Microbiology results: 4/9 sternal wound: ngtd  Thank you for allowing pharmacy to be a part of this patient's care.  Sloan Leiter, PharmD, BCPS, BCCCP Clinical Pharmacist Please refer to [facility] for [place] numbers 10/27/2019       9:42 PM  Please check [email or web address] for unit-specific pharmacist phone numbers

## 2019-10-27 NOTE — Progress Notes (Signed)
Hypoglycemic Event  CBG: 56 Treatment: D50 25 mL (12.5 gm)  Symptoms: None  Follow-up CBG: Time:0531 CBG Result:109  Possible Reasons for Event: Unknown  Comments/MD notified:CCM elink MD to order D10 gtt    [name], Carolynn Comment

## 2019-10-27 NOTE — Progress Notes (Signed)
Pharmacy Antibiotic Note  [name] is a 49 y.o. female admitted on 10/25/2019 with TV repair. Postop course has been complicated by hemorrhagic shock and [facility] ECMO was initiated later on 4/7. Back to OR for re-exploration and washout on 4/8 and 4/9. Patient remains with open chest. She received vancomycin and levofloxacin for perioperative antibiotics. Pharmacy has been consulted for vancomycin and meropenem dosing while chest is still open. She is now post OR for chest closure and decannulation from ECMO.  Vancomycin random level collected today with AM labs is above goal at 40.  Plan: No vancomycin today. Will change to vancomycin order to variable dose per renal function.  Meropenem 2 g IV q12h Patient can likely stop antibiotics now that chest is closed. Have discussed with ECMO team and primary would prefer to continue for now until able to round together in the unit.   Monitor renal function, surgical plans, length of therapy  Height: 5\' 7"  (170.2 cm) Weight: 98.5 kg (217 lb 2.5 oz) IBW/kg (Calculated) : 61.6  Temp (24hrs), Avg:97.2 F (36.2 C), Min:84.2 F (29 C), Max:98.8 F (37.1 C)  Recent Labs  Lab 10/29/2019 0956 11/06/2019 1107 10/27/2019 1838 10/18/2019 1843 10/25/2019 0512 10/30/2019 0735 10/24/19 0356 10/24/19 1708 10/25/19 0510 10/25/19 0510 10/25/19 1108 10/25/19 1706 11/05/2019 0406 11/13/2019 1348 10/19/2019 1833 10/27/19 0412 10/27/19 0511  WBC  --    < >  --    < > 3.6*   < > 5.7   < > 4.2   < >  --  3.9* 4.2 6.0 6.6 6.7  --   CREATININE  --    < >  --    < > 2.04*   < > 2.49*   < > 2.57*  --   --  2.30* 2.48*  --  2.41* 2.80*  --   LATICACIDVEN 3.0*  --  2.3*  --  1.6  --  1.5  --   --   --   --   --   --   --   --   --   --   VANCOTROUGH  --   --   --   --   --   --   --   --   --   --  65*  --   --   --   --   --   --   VANCORANDOM  --   --   --   --   --   --   --   --   --   --   --   --   --   --   --   --  40   < > = values in this interval not  displayed.    Estimated Creatinine Clearance: 30.3 mL/min (A) (by C-G formula based on SCr of 2.8 mg/dL (H)).    Allergies  Allergen Reactions  . Lisinopril     angioedema  . Penicillins Hives    DID THE REACTION INVOLVE: Swelling of the face/tongue/throat, SOB, or low BP? Unknown Sudden or severe rash/hives, skin peeling, or the inside of the mouth or nose? Unknown Did it require medical treatment? Unknown When did it last happen?age 70 If all above answers are "NO", may proceed with cephalosporin use.     Antimicrobials this admission: Vancomycin 4/7 >> Levofloxacin 4/7 >> 4/8 Meropenem 4/9 >>  Dose adjustments this admission: 4/11 VT 29: reduced to 750 mg IV q24h  Microbiology results: 4/9 sternal wound: ngtd  Thank you for allowing pharmacy to be a part of this patient's care.  Vertis Kelch, PharmD, BCPS PGY2 Cardiology Pharmacy Resident Phone [phone] 10/27/2019       3:02 PM  Please check [email or web address] for unit-specific pharmacist phone numbers

## 2019-10-27 NOTE — Progress Notes (Signed)
LTM maint complete - no skin breakdown under: FP1,F7,T7 no skin breakdown.

## 2019-10-27 NOTE — Progress Notes (Signed)
[facility] Progress Note Patient Name: [name] DOB: March 06, 1971 MRN: [id number]   Date of Service  10/27/2019  HPI/Events of Note  Hypoglycemia - Blood glucose = 75.   eICU Interventions  Will restart D10W IV infusion at 40 mL/hour.      Intervention Category Major Interventions: Other:  Lysle Dingwall 10/27/2019, 5:20 AM

## 2019-10-27 NOTE — Progress Notes (Signed)
Hypoglycemic Event  CBG: 36  Treatment: D50 50 mL (25 gm)  Symptoms: None (unable to assess)  Follow-up CBG: Time:0027 CBG Result:100  Possible Reasons for Event: Unknown  Comments/MD notified: protocol followed, will monitor CBG more frequently    [name], Carolynn Comment

## 2019-10-27 NOTE — Progress Notes (Signed)
Patient ID: [name], female   DOB: Jun 21, 1971, 49 y.o.   MRN: [id number] P    Advanced Heart Failure Rounding Note  PCP-Cardiologist: No primary care provider on file.   Subjective:    Events: - 4/7 Underwent mini TV repair c/b diffuse coagulopathy and severe bleeding.  Received RBCS, PLTs, TXA, cryo, Factor 7, steroids - 4/7 take back to OR for bleeding and [facility] ECMO placement  - 4/8 Repeat take back to OR for washout - 4/9 Back to OR for right chest hematoma removal. TEE in OR showed EF 40-45%, mild-moderately decreased RV systolic function, s/p TV repair with mild TR.  - 4/10 EEG with status epilepticus - 4/12 ECMO decannulated and chest closed. Atrial fibrillation in OR with epinephrine.   Remains on vent. On milrinone 0.5, NE 7, off NO. Oliguric despite Lasix gtt with creatinine 2.49 => 2.55 => 2.48 => 2.8. CXR with right-sided opacity. CVP 15.   She remains on vancomycin/meropenem. PCT 7.32.   Hgb 11.1 this am. PLTs 68 => 36 => 33 => 83.    4/11 noted to have constant jerking/twitching. EEG done showing convulsive status epilepticus.  Neuro following, now on Keppra and Vimpat as well as Propofol and Versed.  Bursts on EEG have resolved. CT head was negative.   Flo-Trak CI 1.9, co-ox 85%.    Objective:   Weight Range: 98.5 kg Body mass index is 34.01 kg/m.   Vital Signs:   Temp:  [84.2 F (29 C)-98.8 F (37.1 C)] 97.9 F (36.6 C) (04/13 0725) Pulse Rate:  [106-113] 113 (04/13 0725) Resp:  [0-25] 25 (04/13 0725) BP: (107)/(61) 107/61 (04/13 0725) SpO2:  [94 %-100 %] 96 % (04/13 0725) Arterial Line BP: (88-122)/(51-72) 108/63 (04/13 0715) FiO2 (%):  [50 %-100 %] 50 % (04/13 0725) Weight:  [98.5 kg] 98.5 kg (04/13 0500) Last BM Date: 10/15/2019  Weight change: Filed Weights   10/25/19 0645 10/20/2019 0545 10/27/19 0500  Weight: 92.4 kg 93.5 kg 98.5 kg    Intake/Output:   Intake/Output Summary (Last 24 hours) at 10/27/2019 0811 Last data filed at 10/27/2019  0700 Gross per 24 hour  Intake 6703.56 ml  Output 1855 ml  Net 4848.56 ml      Physical Exam    General: NAD Neck: JVP difficult, no thyromegaly or thyroid nodule.  Lungs: Decreased at bases.  CV: Nondisplaced PMI.  Heart regular S1/S2, no S3/S4, no murmur.  1+ edema to thighs.  Abdomen: Soft, nontender, no hepatosplenomegaly, no distention.  Skin: Intact without lesions or rashes.  Neurologic: Sedated on vent.  Extremities: No clubbing or cyanosis.  HEENT: Normal.    Telemetry   ?Sinus tachy 100s, personally reviewed   Labs    CBC Recent Labs    11/05/2019 1833 10/25/2019 1834 10/27/19 0412 10/27/19 0412 10/27/19 0421 10/27/19 0626  WBC 6.6  --  6.7  --   --   --   HGB 11.2*   < > 11.1*   < > 10.9* 10.5*  HCT 33.7*   < > 33.2*   < > 32.0* 31.0*  MCV 88.7  --  90.2  --   --   --   PLT 107*  --  83*  --   --   --    < > = values in this interval not displayed.   Basic Metabolic Panel Recent Labs    10/20/2019 1833 10/20/2019 1834 10/27/19 0412 10/27/19 0412 10/27/19 0421 10/27/19 0626  NA 142   < >  141   < > 141 142  K 4.1   < > 4.5   < > 4.4 4.2  CL 104  --  103  --   --   --   CO2 26  --  26  --   --   --   GLUCOSE 104*  --  72  --   --   --   BUN 42*  --  44*  --   --   --   CREATININE 2.41*  --  2.80*  --   --   --   CALCIUM 8.2*  --  8.5*  --   --   --   MG 2.4  --  2.3  --   --   --    < > = values in this interval not displayed.   Liver Function Tests Recent Labs    11/06/2019 0406 10/27/19 0412  AST 60* 44*  ALT 58* 36  ALKPHOS 94 102  BILITOT 1.6* 1.8*  PROT 4.3* 5.3*  ALBUMIN 2.6* 3.4*   No results for input(s): LIPASE, AMYLASE in the last 72 hours. Cardiac Enzymes No results for input(s): CKTOTAL, CKMB, CKMBINDEX, TROPONINI in the last 72 hours.  BNP: BNP (last 3 results) Recent Labs    04/15/19 1101  BNP 248.7*    ProBNP (last 3 results) No results for input(s): PROBNP in the last 8760 hours.   D-Dimer No results for  input(s): DDIMER in the last 72 hours. Hemoglobin A1C No results for input(s): HGBA1C in the last 72 hours. Fasting Lipid Panel Recent Labs    11/04/2019 0406  TRIG 126   Thyroid Function Tests No results for input(s): TSH, T4TOTAL, T3FREE, THYROIDAB in the last 72 hours.  Invalid input(s): FREET3  Other results:   Imaging    DG Chest Port 1 View  Result Date: 10/25/2019 CLINICAL DATA:  Status post ECMO decannulation EXAM: PORTABLE CHEST 1 VIEW COMPARISON:  Film from earlier in the same day. FINDINGS: Endotracheal tube and gastric catheter are again noted in satisfactory position. Left jugular sheath is seen with a catheter extending into the left innominate vein. Multiple right-sided thoracostomy tubes are noted. These are stable in appearance from the prior exam. Pericardial drain and mediastinal drain are noted as well as a left thoracostomy tube. Postsurgical changes are seen and stable. Cardiac shadow is stable. Patchy perihilar and basilar opacity is again seen on the right relatively stable from the prior exam. Some slight increase in left perihilar opacity is noted. No pneumothorax is seen. No sizable effusion is noted. ECMO cannulas have been removed in the interval. IMPRESSION: Patchy airspace opacities are right greater than left relatively stable from the prior exam with the exception of slight increase on the left. Interval removal of ECMO cannulas. Tubes and lines as described. Electronically Signed   By: Inez Catalina M.D.   On: 10/25/2019 14:08   [place] EKG SITE RITE  Result Date: 10/27/2019 If Site Rite image not attached, placement could not be confirmed due to current cardiac rhythm.    Medications:     Scheduled Medications: . bisacodyl  10 mg Oral Daily   Or  . bisacodyl  10 mg Rectal Daily  . chlorhexidine gluconate (MEDLINE KIT)  15 mL Mouth Rinse BID  . Chlorhexidine Gluconate Cloth  6 each Topical Daily  . feeding supplement (PIVOT 1.5 CAL)  1,000 mL Per Tube  Q24H  . insulin aspart  0-24 Units Subcutaneous Q4H  .  levothyroxine  50 mcg Per Tube Q0600  . mouth rinse  15 mL Mouth Rinse 10 times per day  . metolazone  2.5 mg Oral Once  . pantoprazole (PROTONIX) IV  40 mg Intravenous QHS  . sodium chloride flush  10-40 mL Intracatheter Q12H    Infusions: . sodium chloride 10 mL/hr at 10/27/19 0700  . sodium chloride 10 mL/hr at 10/27/19 0700  . albumin human 12.5 g (10/27/19 0407)  . albumin human 12.5 g (10/27/19 0236)  . amiodarone 30 mg/hr (10/27/19 0700)  . dextrose 40 mL/hr at 10/27/19 0700  . furosemide (LASIX) infusion 12 mg/hr (10/27/19 0700)  . lacosamide (VIMPAT) IV Stopped (10/20/2019 2348)  . levETIRAcetam Stopped (10/27/19 0039)  . meropenem (MERREM) IV Stopped (10/20/2019 2249)  . midazolam (VERSED) infusion    . milrinone    . norepinephrine (LEVOPHED) Adult infusion 7 mcg/min (10/27/19 0700)  . propofol (DIPRIVAN) infusion 40 mcg/kg/min (10/27/19 0700)  . vancomycin Stopped (10/27/2019 1549)    PRN Medications: sodium chloride, sodium chloride, albumin human, artificial tears, dextrose, metoprolol tartrate, midazolam, morphine injection, ondansetron (ZOFRAN) IV, sodium chloride flush     Assessment/Plan   1. Post-cardiotomy hemorrhagic shock - Patient with refractory post-operative chest bleeding s/p minimally invasive TV repair on 11/06/2019 in setting of longstanding SLE - has received  RBCS, PLTs, TXA, cryo, Factor 7, steroids - ECMO started in OR - Repeat OR visit for right chest clot removal on 4/9 with improved hemodynamics, TEE with EF 40-45%, mild-moderately decreased RV function, stable replaced TV.  - ECMO decannulated 4/12.  - Stable now w/o overt bleeding.   2. CHF, primarily RV failure with cardiogenic shock - CVP 15 this morning with co-ox 85% (?accurate), Flo-Trak CI 1.9. With anasarca.  Oliguric despite Lasix gtt.  - Continue milrinone 0.5, will wean down norepinephrine as able, decrease to 5 now.  - Increase  Lasix incrementally to 15 mg/hr with metolazone 2.5 x 1 to stimulate UOP.   - If she remains oliguric despite pushing diuresis and stable MAP/CI, will need CVVH.   3. 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  - plan as above  4. Post-operative respiratory failure - remains intubated, FiO2 0.4.   - Progressive airspace disease in R lung after mini thoracotomy, back to OR 4/9 for right chest clot removal.  - CCM following vent. - She is on vancomycin/cefepime, PCT 7.32.   5. Acute blood loss anemia - as above - continue to replace as needed  6. Thrombocytopenia - Due to shock/coaguloapthy - Platelets significantly improved post-ECMO decannulation.   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  - She is now on Vimpat and Keppra.  - Propofol to 30 and Versed begun.  - No longer with bursts on EEG.    - Concern for hypoxic injury.  - Neuro following.  - Would like to wean Versed/Propofol today, hopefully that will help [place] wean norepinephrine.   8. SLE - no change  9. AKI  - Creatinine 2.25 => 2.49 => 2.57 => 2.48 => 2.8.   - With poor UOP and anasarca, may need CVVH.  Will try pushing diuresis today first.   10. Nutrition - Tube feeds started.   11. DVT prophylaxis - SCDs and [place] heparin  12. Atrial fibrillation - Noted with epinephrine administration in OR 4/12.  - ?Rhythm this morning, may be atypical flutter => ECG.  - Continue amiodarone gtt.   CRITICAL CARE Performed  by: Loralie Champagne  Total critical care time: 45 minutes  Critical care time was exclusive of separately billable procedures and treating other patients.  Critical care was necessary to treat or prevent imminent or life-threatening deterioration.  Critical care was time spent personally by me (independent of midlevel providers or residents) on the following activities: development of treatment plan with patient and/or surrogate as well as nursing,  discussions with consultants, evaluation of patient's response to treatment, examination of patient, obtaining history from patient or surrogate, ordering and performing treatments and interventions, ordering and review of laboratory studies, ordering and review of radiographic studies, pulse oximetry and re-evaluation of patient's condition.    Length of Stay: 6  Loralie Champagne, MD  10/27/2019, 8:11 AM  Advanced Heart Failure Team Pager [phone] (M-F; 7a - 4p)  Please contact [facility] for night-coverage after hours (4p -7a ) and weekends on [email or web address]

## 2019-10-28 ENCOUNTER — Inpatient Hospital Stay (HOSPITAL_COMMUNITY): Payer: BC Managed Care – PPO

## 2019-10-28 DIAGNOSIS — R569 Unspecified convulsions: Secondary | ICD-10-CM

## 2019-10-28 DIAGNOSIS — J9601 Acute respiratory failure with hypoxia: Secondary | ICD-10-CM | POA: Diagnosis not present

## 2019-10-28 DIAGNOSIS — G40901 Epilepsy, unspecified, not intractable, with status epilepticus: Secondary | ICD-10-CM | POA: Diagnosis not present

## 2019-10-28 DIAGNOSIS — I50813 Acute on chronic right heart failure: Secondary | ICD-10-CM | POA: Diagnosis not present

## 2019-10-28 LAB — TYPE AND SCREEN
ABO/RH(D): A POS
Antibody Screen: NEGATIVE
Unit division: 0
Unit division: 0
Unit division: 0
Unit division: 0
Unit division: 0
Unit division: 0
Unit division: 0
Unit division: 0
Unit division: 0
Unit division: 0
Unit division: 0
Unit division: 0
Unit division: 0
Unit division: 0
Unit division: 0

## 2019-10-28 LAB — BPAM RBC
Blood Product Expiration Date: 202104152359
Blood Product Expiration Date: 202104292359
Blood Product Expiration Date: 202105022359
Blood Product Expiration Date: 202105032359
Blood Product Expiration Date: 202105032359
Blood Product Expiration Date: 202105032359
Blood Product Expiration Date: 202105112359
Blood Product Expiration Date: 202105112359
Blood Product Expiration Date: 202105112359
Blood Product Expiration Date: 202105112359
Blood Product Expiration Date: 202105112359
Blood Product Expiration Date: 202105132359
Blood Product Expiration Date: 202105132359
Blood Product Expiration Date: 202105132359
Blood Product Expiration Date: 202105132359
ISSUE DATE / TIME: 202104081418
ISSUE DATE / TIME: 202104082048
ISSUE DATE / TIME: 202104082048
ISSUE DATE / TIME: 202104082103
ISSUE DATE / TIME: 202104090142
ISSUE DATE / TIME: 202104090812
ISSUE DATE / TIME: 202104091310
ISSUE DATE / TIME: 202104100903
ISSUE DATE / TIME: 202104110736
Unit Type and Rh: 5100
Unit Type and Rh: 5100
Unit Type and Rh: 5100
Unit Type and Rh: 5100
Unit Type and Rh: 5100
Unit Type and Rh: 5100
Unit Type and Rh: 5100
Unit Type and Rh: 5100
Unit Type and Rh: 5100
Unit Type and Rh: 5100
Unit Type and Rh: 6200
Unit Type and Rh: 6200
Unit Type and Rh: 6200
Unit Type and Rh: 6200
Unit Type and Rh: 6200

## 2019-10-28 LAB — POCT I-STAT 7, (LYTES, BLD GAS, ICA,H+H)
Acid-base deficit: 1 mmol/L (ref 0.0–2.0)
Acid-base deficit: 1 mmol/L (ref 0.0–2.0)
Bicarbonate: 25.1 mmol/L (ref 20.0–28.0)
Bicarbonate: 24.3 mmol/L (ref 20.0–28.0)
Calcium, Ion: 1.13 mmol/L — ABNORMAL LOW (ref 1.15–1.40)
Calcium, Ion: 1.09 mmol/L — ABNORMAL LOW (ref 1.15–1.40)
HCT: 34 % — ABNORMAL LOW (ref 36.0–46.0)
HCT: 33 % — ABNORMAL LOW (ref 36.0–46.0)
Hemoglobin: 11.6 g/dL — ABNORMAL LOW (ref 12.0–15.0)
Hemoglobin: 11.2 g/dL — ABNORMAL LOW (ref 12.0–15.0)
O2 Saturation: 95 %
O2 Saturation: 98 %
Patient temperature: 37
Patient temperature: 36.7
Potassium: 4.1 mmol/L (ref 3.5–5.1)
Potassium: 4.1 mmol/L (ref 3.5–5.1)
Sodium: 138 mmol/L (ref 135–145)
Sodium: 137 mmol/L (ref 135–145)
TCO2: 26 mmol/L (ref 22–32)
TCO2: 26 mmol/L (ref 22–32)
pCO2 arterial: 44.2 mmHg (ref 32.0–48.0)
pCO2 arterial: 42.6 mmHg (ref 32.0–48.0)
pH, Arterial: 7.361 (ref 7.350–7.450)
pH, Arterial: 7.364 (ref 7.350–7.450)
pO2, Arterial: 79 mmHg — ABNORMAL LOW (ref 83.0–108.0)
pO2, Arterial: 107 mmHg (ref 83.0–108.0)

## 2019-10-28 LAB — AEROBIC/ANAEROBIC CULTURE W GRAM STAIN (SURGICAL/DEEP WOUND)
Culture: NO GROWTH
Gram Stain: NONE SEEN

## 2019-10-28 LAB — RENAL FUNCTION PANEL
Albumin: 3.2 g/dL — ABNORMAL LOW (ref 3.5–5.0)
Albumin: 2.8 g/dL — ABNORMAL LOW (ref 3.5–5.0)
Anion gap: 15 (ref 5–15)
Anion gap: 13 (ref 5–15)
BUN: 51 mg/dL — ABNORMAL HIGH (ref 6–20)
BUN: 47 mg/dL — ABNORMAL HIGH (ref 6–20)
CO2: 23 mmol/L (ref 22–32)
CO2: 24 mmol/L (ref 22–32)
Calcium: 8.6 mg/dL — ABNORMAL LOW (ref 8.9–10.3)
Calcium: 8.3 mg/dL — ABNORMAL LOW (ref 8.9–10.3)
Chloride: 100 mmol/L (ref 98–111)
Chloride: 101 mmol/L (ref 98–111)
Creatinine, Ser: 3.12 mg/dL — ABNORMAL HIGH (ref 0.44–1.00)
Creatinine, Ser: 2.76 mg/dL — ABNORMAL HIGH (ref 0.44–1.00)
GFR calc Af Amer: 20 mL/min — ABNORMAL LOW (ref >60)
GFR calc Af Amer: 23 mL/min — ABNORMAL LOW (ref >60)
GFR calc non Af Amer: 17 mL/min — ABNORMAL LOW (ref >60)
GFR calc non Af Amer: 20 mL/min — ABNORMAL LOW (ref >60)
Glucose, Bld: 120 mg/dL — ABNORMAL HIGH (ref 70–99)
Glucose, Bld: 160 mg/dL — ABNORMAL HIGH (ref 70–99)
Phosphorus: 8.4 mg/dL — ABNORMAL HIGH (ref 2.5–4.6)
Phosphorus: 7.4 mg/dL — ABNORMAL HIGH (ref 2.5–4.6)
Potassium: 4.3 mmol/L (ref 3.5–5.1)
Potassium: 4.4 mmol/L (ref 3.5–5.1)
Sodium: 138 mmol/L (ref 135–145)
Sodium: 138 mmol/L (ref 135–145)

## 2019-10-28 LAB — CBC
HCT: 35.5 % — ABNORMAL LOW (ref 36.0–46.0)
Hemoglobin: 11.9 g/dL — ABNORMAL LOW (ref 12.0–15.0)
MCH: 29.3 pg (ref 26.0–34.0)
MCHC: 33.5 g/dL (ref 30.0–36.0)
MCV: 87.4 fL (ref 80.0–100.0)
Platelets: 63 10*3/uL — ABNORMAL LOW (ref 150–400)
RBC: 4.06 MIL/uL (ref 3.87–5.11)
RDW: 16.1 % — ABNORMAL HIGH (ref 11.5–15.5)
WBC: 9 10*3/uL (ref 4.0–10.5)
nRBC: 0.7 % — ABNORMAL HIGH (ref 0.0–0.2)

## 2019-10-28 LAB — HEPATIC FUNCTION PANEL
ALT: 27 U/L (ref 0–44)
AST: 38 U/L (ref 15–41)
Albumin: 2.9 g/dL — ABNORMAL LOW (ref 3.5–5.0)
Alkaline Phosphatase: 88 U/L (ref 38–126)
Bilirubin, Direct: 0.3 mg/dL — ABNORMAL HIGH (ref 0.0–0.2)
Indirect Bilirubin: 0.9 mg/dL (ref 0.3–0.9)
Total Bilirubin: 1.2 mg/dL (ref 0.3–1.2)
Total Protein: 4.9 g/dL — ABNORMAL LOW (ref 6.5–8.1)

## 2019-10-28 LAB — COOXEMETRY PANEL
Carboxyhemoglobin: 2 % — ABNORMAL HIGH (ref 0.5–1.5)
Carboxyhemoglobin: 2.1 % — ABNORMAL HIGH (ref 0.5–1.5)
Carboxyhemoglobin: 1.9 % — ABNORMAL HIGH (ref 0.5–1.5)
Methemoglobin: 1.6 % — ABNORMAL HIGH (ref 0.0–1.5)
Methemoglobin: 1.5 % (ref 0.0–1.5)
Methemoglobin: 2.2 % — ABNORMAL HIGH (ref 0.0–1.5)
O2 Saturation: 98.7 %
O2 Saturation: 85 %
O2 Saturation: 74.2 %
Total hemoglobin: 11.6 g/dL — ABNORMAL LOW (ref 12.0–16.0)
Total hemoglobin: 12.9 g/dL (ref 12.0–16.0)
Total hemoglobin: 10.8 g/dL — ABNORMAL LOW (ref 12.0–16.0)

## 2019-10-28 LAB — GLUCOSE, CAPILLARY
Glucose-Capillary: 101 mg/dL — ABNORMAL HIGH (ref 70–99)
Glucose-Capillary: 113 mg/dL — ABNORMAL HIGH (ref 70–99)
Glucose-Capillary: 123 mg/dL — ABNORMAL HIGH (ref 70–99)
Glucose-Capillary: 132 mg/dL — ABNORMAL HIGH (ref 70–99)
Glucose-Capillary: 140 mg/dL — ABNORMAL HIGH (ref 70–99)
Glucose-Capillary: 88 mg/dL (ref 70–99)
Glucose-Capillary: 99 mg/dL (ref 70–99)

## 2019-10-28 LAB — PHENOBARBITAL LEVEL: Phenobarbital: 17 ug/mL (ref 15.0–30.0)

## 2019-10-28 LAB — TRIGLYCERIDES: Triglycerides: 84 mg/dL (ref <150)

## 2019-10-28 LAB — PROTIME-INR
INR: 1.1 (ref 0.8–1.2)
Prothrombin Time: 14.5 seconds (ref 11.4–15.2)

## 2019-10-28 MED ORDER — AMIODARONE LOAD VIA INFUSION
150.0000 mg | Freq: Once | INTRAVENOUS | Status: AC
  Administered 2019-10-28: 150 mg via INTRAVENOUS
  Filled 2019-10-28: qty 83.34

## 2019-10-28 MED ORDER — PRISMASOL BGK 4/2.5 32-4-2.5 MEQ/L REPLACEMENT SOLN: Status: DC

## 2019-10-28 MED ORDER — PRISMASOL BGK 4/2.5 32-4-2.5 MEQ/L IV SOLN: INTRAVENOUS | Status: DC

## 2019-10-28 MED ORDER — HEPARIN (PORCINE) 2000 UNITS/L FOR CRRT
INTRAVENOUS_CENTRAL | Status: DC | PRN
  Filled 2019-10-28 (×9): qty 1000

## 2019-10-28 MED ORDER — AMIODARONE LOAD VIA INFUSION
150.0000 mg | Freq: Once | INTRAVENOUS | Status: AC
  Administered 2019-10-28: 150 mg via INTRAVENOUS

## 2019-10-28 MED ORDER — PHENOBARBITAL SODIUM 130 MG/ML IJ SOLN
130.0000 mg | Freq: Two times a day (BID) | INTRAMUSCULAR | Status: DC
  Administered 2019-10-28 – 2019-10-30 (×4): 130 mg via INTRAVENOUS
  Filled 2019-10-28 (×5): qty 1

## 2019-10-28 MED ORDER — METOLAZONE 5 MG PO TABS
5.0000 mg | ORAL_TABLET | Freq: Every day | ORAL | Status: DC
  Administered 2019-10-29 – 2019-10-30 (×2): 5 mg
  Filled 2019-10-28 (×2): qty 1

## 2019-10-28 MED ORDER — B COMPLEX-C PO TABS
1.0000 | ORAL_TABLET | Freq: Every day | ORAL | Status: DC
  Administered 2019-10-28 – 2019-11-02 (×6): 1
  Filled 2019-10-28 (×6): qty 1

## 2019-10-28 MED ORDER — PIVOT 1.5 CAL PO LIQD: 1000.0000 mL | ORAL | Status: DC

## 2019-10-28 MED ORDER — HEPARIN SODIUM (PORCINE) 1000 UNIT/ML DIALYSIS
1000.0000 [IU] | INTRAMUSCULAR | Status: DC | PRN
  Administered 2019-10-28 – 2019-11-05 (×4): 2400 [IU] via INTRAVENOUS_CENTRAL
  Filled 2019-10-28 (×2): qty 6
  Filled 2019-10-28 (×2): qty 3
  Filled 2019-10-28: qty 4
  Filled 2019-10-28 (×4): qty 6
  Filled 2019-10-28: qty 3

## 2019-10-28 MED ORDER — PIVOT 1.5 CAL PO LIQD
1000.0000 mL | ORAL | Status: DC
  Administered 2019-10-29 – 2019-11-01 (×6): 1000 mL
  Filled 2019-10-28 (×7): qty 1000

## 2019-10-28 NOTE — Progress Notes (Signed)
Patient ID: [name], female   DOB: [birth year], 49 y.o.   MRN: [id number] P    Advanced Heart Failure Rounding Note  PCP-Cardiologist: No primary care provider on file.   Subjective:    Events: - 4/7 Underwent mini TV repair c/b diffuse coagulopathy and severe bleeding.  Received RBCS, PLTs, TXA, cryo, Factor 7, steroids - 4/7 take back to OR for bleeding and [facility] ECMO placement  - 4/8 Repeat take back to OR for washout - 4/9 Back to OR for right chest hematoma removal. TEE in OR showed EF 40-45%, mild-moderately decreased RV systolic function, s/p TV repair with mild TR.  - 4/10 EEG with status epilepticus. Head CT negative.  - 4/12 ECMO decannulated and chest closed. Atrial fibrillation in OR with epinephrine.   Remains on vent. Ongoing seizure activity, off Versed gtt but remains on Propofol and got a dose of phenobarbitol.  On milrinone 0.5, NE 14, NO 29. Oliguric despite Lasix gtt at 20 mg/hr with creatinine 2.49 => 2.55 => 2.48 => 2.8 => 3.1. CXR with unchanged right-sided opacity. CVP 16. CI 2.8 from FloTrac with co-ox ?85%.   Tm 99.5, WBCs 9 => abx stopped today.   Hgb 11.1 this am. PLTs 68 => 36 => 33 => 83 => 63.    This morning, atrial fibrillation rate increased to 150 range, increased amiodarone to 60 mg/hr with bolus.   Objective:   Weight Range: 98.5 kg Body mass index is 34.01 kg/m.   Vital Signs:   Temp:  [96.4 F (35.8 C)-99.5 F (37.5 C)] 99.5 F (37.5 C) (04/14 0800) Pulse Rate:  [110-133] 129 (04/14 0258) Resp:  [0-26] 0 (04/14 0800) BP: (97-112)/(59-66) 97/59 (04/13 1519) SpO2:  [93 %-100 %] 100 % (04/14 0800) Arterial Line BP: (87-125)/(53-78) 104/53 (04/14 0800) FiO2 (%):  [50 %] 50 % (04/14 0730) Last BM Date: 10/27/19  Weight change: Filed Weights   10/25/19 0645 11/03/2019 0545 10/27/19 0500  Weight: 92.4 kg 93.5 kg 98.5 kg    Intake/Output:   Intake/Output Summary (Last 24 hours) at 10/28/2019 0828 Last data filed at 10/28/2019  0811 Gross per 24 hour  Intake 3380.11 ml  Output 870 ml  Net 2510.11 ml      Physical Exam    General: Sedated on vent.  Neck: No JVD, no thyromegaly or thyroid nodule.  Lungs: Clear to auscultation bilaterally with normal respiratory effort. CV: Dressing on chest, irregular and tachy rhythm. 1+ edema to knees.  Abdomen: Soft, nontender, no hepatosplenomegaly, no distention.  Skin: Intact without lesions or rashes.  Neurologic: Sedated.  Psych: Normal affect. Extremities: No clubbing or cyanosis.  HEENT: Normal.    Telemetry   Atrial fibrillation up to 150s (personally reviewed).    Labs    CBC Recent Labs    10/27/19 1651 10/27/19 1651 10/28/19 0312 10/28/19 0355  WBC 7.2  --  9.0  --   HGB 11.8*   < > 11.9* 11.6*  HCT 34.8*   < > 35.5* 34.0*  MCV 87.7  --  87.4  --   PLT 66*  --  63*  --    < > = values in this interval not displayed.   Basic Metabolic Panel Recent Labs    10/27/19 0412 10/27/19 0421 10/27/19 1651 10/27/19 1651 10/28/19 0312 10/28/19 0355  NA 141   < > 137   < > 138 138  K 4.5   < > 4.1   < > 4.3 4.1  CL 103   < > 100  --  100  --   CO2 26   < > 22  --  23  --   GLUCOSE 72   < > 94  --  120*  --   BUN 44*   < > 48*  --  51*  --   CREATININE 2.80*   < > 3.11*  --  3.12*  --   CALCIUM 8.5*   < > 8.5*  --  8.6*  --   MG 2.3  --  2.4  --   --   --   PHOS  --   --   --   --  8.4*  --    < > = values in this interval not displayed.   Liver Function Tests Recent Labs    10/27/19 0412 10/28/19 0312  AST 44* 38  ALT 36 27  ALKPHOS 102 88  BILITOT 1.8* 1.2  PROT 5.3* 4.9*  ALBUMIN 3.4* 2.9*  3.2*   No results for input(s): LIPASE, AMYLASE in the last 72 hours. Cardiac Enzymes No results for input(s): CKTOTAL, CKMB, CKMBINDEX, TROPONINI in the last 72 hours.  BNP: BNP (last 3 results) Recent Labs    04/15/19 1101  BNP 248.7*    ProBNP (last 3 results) No results for input(s): PROBNP in the last 8760  hours.   D-Dimer No results for input(s): DDIMER in the last 72 hours. Hemoglobin A1C No results for input(s): HGBA1C in the last 72 hours. Fasting Lipid Panel Recent Labs    11/05/2019 0406  TRIG 126   Thyroid Function Tests No results for input(s): TSH, T4TOTAL, T3FREE, THYROIDAB in the last 72 hours.  Invalid input(s): FREET3  Other results:   Imaging    DG CHEST PORT 1 VIEW  Result Date: 10/28/2019 CLINICAL DATA:  49 year old female status post open heart surgery. EXAM: PORTABLE CHEST 1 VIEW COMPARISON:  Chest x-ray 10/27/2019. FINDINGS: Three right-sided chest tubes, 1 left-sided chest tube and 2 midline drains (presumably mediastinal/pericardial) are again noted, stable in position very poor aeration in the right lung again noted. There is a left upper extremity PICC with tip terminating in the superior cavoatrial junction. An endotracheal tube is in place with tip 4.6 cm above the carina. Left lung appears low volume, but is generally clear, with exception of some subsegmental atelectasis at the left base. No pleural effusion. No appreciable pneumothorax. Heart size is mildly enlarged. Mediastinal contours are distorted by patient positioning. Postoperative changes of tricuspid annuloplasty. Epicardial pacing wires are noted. IMPRESSION: 1. Unchanged radiographic appearance of the chest, as detailed above. Persistent opacity throughout the right hemithorax may reflect areas of atelectasis and/or consolidation in the lung with superimposed right pleural effusion. Electronically Signed   By: Vinnie Langton M.D.   On: 10/28/2019 08:21   DG CHEST PORT 1 VIEW  Result Date: 10/27/2019 CLINICAL DATA:  PICC line placement. EXAM: PORTABLE CHEST 1 VIEW COMPARISON:  Chest x-ray from same day at 0510 hours. FINDINGS: New left upper extremity PICC line with tip near the cavoatrial junction. Unchanged endotracheal and enteric tubes. Unchanged left internal jugular central venous catheter.  Unchanged bilateral chest tubes and mediastinal drains. Stable cardiomediastinal silhouette status post tricuspid valve replacement. Unchanged airspace disease throughout the majority of the right lung. Unchanged left basilar atelectasis and small right greater than left pleural effusions. Pleural thickening along the medial right lung apex again noted. No pneumothorax. No acute osseous abnormality. 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 IMPRESSION: 1. New left upper extremity PICC line with tip near the cavoatrial junction. 2. Otherwise stable chest. Electronically Signed   By: Titus Dubin M.D.   On: 10/27/2019 11:31     Medications:     Scheduled Medications: . bisacodyl  10 mg Oral Daily   Or  . bisacodyl  10 mg Rectal Daily  . chlorhexidine gluconate (MEDLINE KIT)  15 mL Mouth Rinse BID  . Chlorhexidine Gluconate Cloth  6 each Topical Daily  . feeding supplement (PIVOT 1.5 CAL)  1,000 mL Per Tube Q24H  . insulin aspart  0-24 Units Subcutaneous Q4H  . levothyroxine  50 mcg Per Tube Q0600  . mouth rinse  15 mL Mouth Rinse 10 times per day  . metolazone  5 mg Oral Daily  . pantoprazole (PROTONIX) IV  40 mg Intravenous QHS  . sodium chloride flush  10-40 mL Intracatheter Q12H  . sodium chloride flush  10-40 mL Intracatheter Q12H    Infusions: . sodium chloride Stopped (10/27/19 1308)  . sodium chloride 10 mL/hr at 10/27/19 1722  . amiodarone 60 mg/hr (10/28/19 0810)  . dextrose 40 mL/hr at 10/28/19 0700  . furosemide (LASIX) infusion 20 mg/hr (10/28/19 0700)  . lacosamide (VIMPAT) IV Stopped (10/27/19 2303)  . levETIRAcetam Stopped (10/28/19 0101)  . midazolam (VERSED) infusion Stopped (10/27/19 1548)  . milrinone 0.375 mcg/kg/min (10/28/19 0823)  . norepinephrine (LEVOPHED) Adult infusion 14 mcg/min (10/28/19 0700)  . propofol (DIPRIVAN) infusion 40 mcg/kg/min (10/28/19 0700)  . sodium chloride 0.9 % 25 mL with PHENObarbital (LUMINAL) 1,500 mg infusion  50 mL/hr at 10/27/19 1725    PRN Medications: sodium chloride, sodium chloride, artificial tears, dextrose, dextrose, metoprolol tartrate, midazolam, morphine injection, ondansetron (ZOFRAN) IV, sodium chloride flush, sodium chloride flush     Assessment/Plan   1. Post-cardiotomy hemorrhagic shock - Patient with refractory post-operative chest bleeding s/p minimally invasive TV repair on 11/09/2019 in setting of longstanding SLE - has received  RBCS, PLTs, TXA, cryo, Factor 7, steroids - ECMO started in OR - Repeat OR visit for right chest clot removal on 4/9 with improved hemodynamics, TEE with EF 40-45%, mild-moderately decreased RV function, stable replaced TV.  - ECMO decannulated 4/12.  - Stable now w/o overt bleeding.   2. CHF, primarily RV failure with cardiogenic shock - CVP 16 this morning with co-ox 85% (?accurate), FloTrak CI 2.8. With anasarca.  Oliguric despite Lasix gtt at 20 mg/hr.   - With adequate CI by FloTrak and tachycardia, will decrease milrinone to 0.375 now. Can decrease to 0.25 if rate continues high and CI stable.  - Continue norepinephrine 14, wean as above (hopefully when we get HR down).   - Stop Lasix gtt as this is not effective.    - HD catheter today and will start CVVH for volume control, discussed with Dr. Posey Pronto.   3. 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  - plan as above  4. Post-operative respiratory failure - remains intubated, FiO2 0.5.   - Progressive airspace disease in R lung after mini thoracotomy, back to OR 4/9 for right chest clot removal.  - CCM following vent. - Vancomycin/cefepime stopped today, afebrile with normal WBCs.   5. Acute blood loss anemia - as above - continue to replace as needed  6. 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  - Platelets significantly better post-ECMO decannulation.   7. Convulsive status  epilepticus - Noted on 4/10 EEG.  - She is now on Vimpat and Keppra.  - Propofol ongoing, stopped Versed.  She got phenobarbital yesterday with ongoing seizures.   - Head CT negative, would not MRI yet unless neuro thinks absolutely necessary (hemodynamic instability with rapid afib).    - Concern for hypoxic injury.   8. SLE - no change  9. AKI  - Creatinine 2.25 => 2.49 => 2.57 => 2.48 => 2.8 => 3.1 with oliguria despite high dose Lasix gtt.   - Will need CVVH, starting today.   10. Nutrition - Tube feeds started.   11. DVT prophylaxis - SCDs  12. Atrial fibrillation - Noted with epinephrine administration in OR 4/12.  - Atypical flutter on 4/13 but rate-controlled, then rapid atrial fibrillation this morning.  - Bolus amiodarone and increase gtt to 60 mg/hr.   - No anticoagulation yet per Dr. Roxy Manns, cardiovert only in event of emergency.   CRITICAL CARE Performed by: Loralie Champagne  Total critical care time: 45 minutes  Critical care time was exclusive of separately billable procedures and treating other patients.  Critical care was necessary to treat or prevent imminent or life-threatening deterioration.  Critical care was time spent personally by me (independent of midlevel providers or residents) on the following activities: development of treatment plan with patient and/or surrogate as well as nursing, discussions with consultants, evaluation of patient's response to treatment, examination of patient, obtaining history from patient or surrogate, ordering and performing treatments and interventions, ordering and review of laboratory studies, ordering and review of radiographic studies, pulse oximetry and re-evaluation of patient's condition.    Length of Stay: 7  Loralie Champagne, MD  10/28/2019, 8:28 AM  Advanced Heart Failure Team Pager [phone] (M-F; 7a - 4p)  Please contact [facility] for night-coverage after hours (4p -7a ) and weekends on [email or web address]

## 2019-10-28 NOTE — Progress Notes (Signed)
Wean propofol by 27mcg per hour per Hortense Ramal MD. Dr. Hortense Ramal will notify RN if there is a need for more sedation or to stop weaning propofol based on EEG  Luberta Mutter RN

## 2019-10-28 NOTE — Progress Notes (Signed)
EVENING ROUNDS NOTE :     [address]       [place] [address]             [phone]                 2 Days Post-Op Procedure(s) (LRB): MEDIASTERNAL WASH OUT,  ECMO DECANNULATION AND STERNAL CLOSURE (N/A) TRANSESOPHAGEAL ECHOCARDIOGRAM (TEE) (N/A)   Total Length of Stay:  LOS: 7 days  Events:  Weaning sedation Awaiting MRI brain Good hemodynamics, remains tachy    BP (!) 97/59   Pulse (!) 129   Temp 98.1 F (36.7 C) (Oral)   Resp (!) 0   Ht 5\' 7"  (1.702 m)   Wt 98.5 kg   LMP 04/02/2017   SpO2 98%   BMI 34.01 kg/m   CVP:  [6 mmHg-17 mmHg] 15 mmHg  Vent Mode: PRVC FiO2 (%):  [50 %] 50 % Set Rate:  [26 bmp] 26 bmp Vt Set:  [400 mL] 400 mL PEEP:  [5 cmH20] 5 cmH20 Pressure Support:  [10 cmH20] 10 cmH20 Plateau Pressure:  [24 cmH20-26 cmH20] 26 cmH20  .  prismasol BGK 4/2.5 500 mL/hr at 10/28/19 1300  .  prismasol BGK 4/2.5 300 mL/hr at 10/28/19 1300  . sodium chloride Stopped (10/27/19 1308)  . sodium chloride 10 mL/hr at 10/27/19 1722  . amiodarone 60 mg/hr (10/28/19 1600)  . dextrose 40 mL/hr at 10/28/19 1600  . furosemide (LASIX) infusion Stopped (10/28/19 0824)  . lacosamide (VIMPAT) IV Stopped (10/28/19 1129)  . levETIRAcetam Stopped (10/28/19 1325)  . midazolam (VERSED) infusion Stopped (10/27/19 1548)  . milrinone 0.25 mcg/kg/min (10/28/19 1600)  . norepinephrine (LEVOPHED) Adult infusion 16 mcg/min (10/28/19 1600)  . prismasol BGK 4/2.5 1,500 mL/hr at 10/28/19 1300  . propofol (DIPRIVAN) infusion 25 mcg/kg/min (10/28/19 1600)    I/O last 3 completed shifts: In: 5825.3 [I.V.:4429.2; NG/GT:20; IV Piggyback:1376.1] Out: 1135 [Urine:635; Chest Tube:500]   CBC Latest Ref Rng & Units 10/28/2019 10/28/2019 10/27/2019  WBC 4.0 - 10.5 K/uL - 9.0 7.2  Hemoglobin 12.0 - 15.0 g/dL 11.6(L) 11.9(L) 11.8(L)  Hematocrit 36.0 - 46.0 % 34.0(L) 35.5(L) 34.8(L)  Platelets 150 - 400 K/uL - 63(L) 66(L)    BMP Latest Ref Rng & Units 10/28/2019  10/28/2019 10/27/2019  Glucose 70 - 99 mg/dL - 120(H) 94  BUN 6 - 20 mg/dL - 51(H) 48(H)  Creatinine 0.44 - 1.00 mg/dL - 3.12(H) 3.11(H)  Sodium 135 - 145 mmol/L 138 138 137  Potassium 3.5 - 5.1 mmol/L 4.1 4.3 4.1  Chloride 98 - 111 mmol/L - 100 100  CO2 22 - 32 mmol/L - 23 22  Calcium 8.9 - 10.3 mg/dL - 8.6(L) 8.5(L)    ABG    Component Value Date/Time   PHART 7.361 10/28/2019 0355   PCO2ART 44.2 10/28/2019 0355   PO2ART 79.0 (L) 10/28/2019 0355   HCO3 25.1 10/28/2019 0355   TCO2 26 10/28/2019 0355   ACIDBASEDEF 1.0 10/28/2019 0355   O2SAT 74.2 10/28/2019 [place], MD 10/28/2019 5:05 PM

## 2019-10-28 NOTE — Progress Notes (Addendum)
Seen and examined in f/u for multifactorial shock after valve replacement.  Remains anuric.  Weaned from versed, some recurrent seizures with this so loaded with phenobarbital.  Obtunded on vent, chest tubes in place, diffuse anasraca, midline VAC in place CXR with stable R>L airspace disease.   ABG benign Plat 25, PEEP 5, DP 20 Remains on iNO Hgb stable Plts 107>>83>>66>>63 CBGs in 90-110 range  A: -Multifactorial (hemorrhagic, cardiac) shock after TV replacement.  Improved, off VA ECMO. 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 - D10, hopefully can start increasing TF soon - Seizure suppression per neurology, probably needs MRI at some point, defer timing to neurology - Potentially to start CRRT today - If runs into trouble with plts, agree with using IVIG   31minutes critical care time Erskine Emery MD PCCM     NAME:  [name], MRN:  [id number], DOB:  1971-06-22, LOS: 7 ADMISSION DATE:  11/11/2019, CONSULTATION DATE:  10/28/2019  REFERRING MD:  Rexene Alberts, MD, CHIEF COMPLAINT:  Vent management.  History of present illness   The patient is a 49 year old woman who is postoperative day 3 from minimally invasive tricuspid valve repair which was then converted into an open procedure.  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  Being followed by the heart failure team for evaluation of pulmonary arterial hypertension,  likely group 1.  However despite echocardiograms showing severely elevated RA pressures, she has had 2 right heart cath that demonstrated normal peripheral vascular resistance for Group 1 pulmonary arterial hypertension.  She was taken to the OR by Dr. Roxy Manns on 4/7 given her persistent symptoms for repair of severe tricuspid regurgitation.  4/8, she had reexploration of her median sternotomy with concern for postoperative bleeding.  There was a large amount of clot evacuated from the anterior mediastinum and significant oozing diffusely.  Does not appear that there were any significant signs for mechanical active bleeding.  Bili at that time she was cannulated for ECMO and started on [place] ECMO on 4/8.  Since then she has had substantial correction of her coagulopathy including Kcentra, FEIBA, cryoprecipitate, FFP and copious blood products given her substantial bleeding in the mediastinum.  She went for decannulation from [facility] ECMO on 4/12.  PCCM is being consulted to assist with vent management.  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   Consults:  Heart failure, PCCM  Procedures:  4/7 tricuspid valve repair through median sternotomy 4/8 reexploration of the mediastinum with cannulation for ECMO 4/12 decannulation from [facility] ECMO   Significant Diagnostic Tests:  Transesophageal echocardiogram January 2021  1. Left ventricular ejection fraction, by visual estimation, is 60 to  65%. The left ventricle has normal function. There is no left ventricular  hypertrophy.  2. The left ventricle has no regional  wall motion abnormalities.  3. Global  right ventricle has mildly reduced systolic function.The right  ventricular size is normal. No increase in right ventricular wall  thickness.  4. Left atrial size was moderately dilated.  5. Right atrial size was severely dilated.  6. The mitral valve is normal in structure. Trivial mitral valve  regurgitation.  7. The tricuspid valve is abnormal.  8. The tricuspid valve is abnormal. Tricuspid valve regurgitation is  severe.  9. Apparent mild restriction of septal leaflet. RVSP ~ 14mmHG.  10. The aortic valve is normal in structure. Aortic valve regurgitation is  not visualized.  11. The pulmonic valve was grossly normal. Pulmonic valve regurgitation is  not visualized.  12. Mild plaque invoving the descending aorta.  13. Severely elevated pulmonary artery systolic pressure.   Right heart catheterization June 2020 Findings:  RA = 5 RV = 34/7 PA =  34/6 (20) PCW = 8 Fick cardiac output/index = 5.9/3.1 PVR = 2.0 WU Ao sat = 100% PA sat = 71%, 72% SVC sat = 66%  Assessment: 1. Minimally elevated PA pressures with normal PVR and cardiac output 2. No evidence of significant intracardiac shunt  Micro Data:  None  Antimicrobials:  Levofloxacin 4/7 >> 4/8 Vancomycin 4/7 >> current Meropenem 4/9 >> current   Subjective:  Attempted to wean sedation on 4/13 but started to have epileptic discharge on EEG. Now on Propofol 40 mcg/hr. Versed weaned off.  Remains oligouric with only 500cc out over the last 24 hours on a Lasix ggt at 20mg .hr.   Objective   Blood pressure (!) 97/59, pulse (!) 129, temperature 99.1 F (37.3 C), resp. rate (!) 26, height 5\' 7"  (1.702 m), weight 98.5 kg, last menstrual period 04/02/2017, SpO2 93 %. CVP:  [0 mmHg-23 mmHg] 15 mmHg  Vent Mode: PRVC FiO2 (%):  [50 %] 50 % Set Rate:  [26 bmp] 26 bmp Vt Set:  [400 mL] 400 mL PEEP:  [5 cmH20] 5 cmH20 Plateau Pressure:  [24 cmH20-29 cmH20] 24 cmH20   Intake/Output Summary (Last 24 hours) at  10/28/2019 1610 Last data filed at 10/28/2019 0400 Gross per 24 hour  Intake 3113 ml  Output 805 ml  Net 2308 ml   Filed Weights   10/25/19 0645 11/01/2019 0545 10/27/19 0500  Weight: 92.4 kg 93.5 kg 98.5 kg   Examination: GEN: Obese female, sedated  HEENT: ETT in place, minimal secretions CV: Tachycardic and irregular rhythm PULM: Diminished bases, passive on vent GI: Soft, hypoactive BS EXT: Global anasarca NEURO: Sedated  ABG 7.36/44/79 Net +24.4L since admission, +2.1L over last 24 hours on Furosemide gtt 20mg /hr Renal function increased slightly with improvement in AST/ALT  Resolved Hospital Problem list   None  Assessment & Plan:  [name] is a 49 y.o. woman with history of SLE with thrombocytopenia, nephritis, AVN and possible mild chronic RV failure who presented for repair of severe symptomatic tricuspid regurgitation on 4/7.    Acute hypoxemic respiratory failure - Continue vent support - Usual VAP prevention measures  Cardiogenic Shock - V/A ecmo 4/8-4/12 - Net +24.4L since admission, +2.1L over last 24 hours on Furosemide gtt 20mg /hr - On milrinone 0.5 mcg/hr and levo 12 mcg/hr  - Nephrology onboard - Diuresis, AC, and inotropes per CHF/TCTS teams  SLE with immune mediated thrombocytopenia and coagulopathy - Improved. No other clinical evidence of bleeding.  - Monitor with AC resumption  Acute encephalopathy Status Epilepticus  - Currently on propofol 40 mcg. Plan to wean and  obtain MRI - On Vimpat and Keppra per neurology.   Best practice:  Diet: TF with free water Pain/Anxiety/Delirium protocol (if indicated): Propofol  VAP protocol (if indicated): Ordered DVT prophylaxis: heparin gtt GI prophylaxis: Pantoprazole Glucose control: Controlled, blood sugars less than 180 Foley required for critically ill state Mobility: Bedrest Code Status: Full code Family Communication: Per primary team Disposition: Needs ICU  Please see attending  attestation for final recommendations.   Ina Homes, MD  [facility] PGY3  Pager: [phone]

## 2019-10-28 NOTE — Progress Notes (Signed)
Nutrition Follow-up  DOCUMENTATION CODES:   Not applicable  INTERVENTION:   Tube Feeding:  Pivot 1.5 at 60 ml/hr  Increase TF to 30 ml/hr; titrate by 10 mL q 12 hours until goal rate of 60 ml/hr Provides 135 g of protein, 2160 kcals and 1094 mL of free water  Add B-complex with C to replace losses while on CRT  NUTRITION DIAGNOSIS:   Inadequate oral intake related to inability to eat, acute illness as evidenced by NPO status.  Being addressed via TF   GOAL:   Patient will meet greater than or equal to 90% of their needs  Progressing  MONITOR:   Vent status, Skin, TF tolerance, Weight trends, Labs, I & O's  REASON FOR ASSESSMENT:   Consult, Ventilator Assessment of nutrition requirement/status  ASSESSMENT:   49 yo female admitted with severe TR with TV repair on 4/7, pt with significant postop bleeding requiring return to OR for median sternotomy and reexploration and initiation of VA ECMO. 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 ECMO 4/14 CRRT initiated  Pt remains on vent support  Anuric AKI with initiation of CRRT today  Tolerating Pivot 1.5 at 20 ml/hr via OG tube Pt currently on D10 infusion for hypoglycemia; likely be able to transition offf D10 once TF titrated towards goal rate  Phosphorus level should improve with initiation of CRRT  Current weight 98.5 kg; pt significantly fluid positive; net +25 L since admission. Based on this, EDW around 73.5 kg; close to admission weight of 71 kg.   Labs: phosphorus  8.4 (H) Meds: lasix drip, ss novolog, D10 at 40  Diet Order:   Diet Order            Diet NPO time specified  Diet effective now              EDUCATION NEEDS:   Not appropriate for education at this time  Skin:  Skin Assessment: Skin Integrity Issues: Skin Integrity Issues:: Wound VAC, Incisions Wound Vac: Chest Incisions: multple chest, R axilla, abdomen  Last BM:  4/9  Height:   Ht Readings from Last 1 Encounters:  10/20/2019 5\' 7"  (1.702 m)    Weight:   Wt Readings from Last 1 Encounters:  10/27/19 98.5 kg    BMI:  Body mass index is 34.01 kg/m.  Estimated Nutritional Needs:   Kcal:  1920-2130 kcals  Protein:  107-142 g  Fluid:  1.7 L   Kerman Passey MS, RDN, LDN, CNSC RD Pager Number and Weekend/On-Call After Hours Pager Located in [place]

## 2019-10-28 NOTE — Progress Notes (Signed)
[address]       [place] [address]             [phone]        CARDIOTHORACIC SURGERY PROGRESS NOTE   R2 Days Post-Op Procedure(s) (LRB): MEDIASTERNAL WASH OUT,  ECMO DECANNULATION AND STERNAL CLOSURE (N/A) TRANSESOPHAGEAL ECHOCARDIOGRAM (TEE) (N/A)  Subjective: Unresponsive on vent.  Reportedly received single dose pentobarbital yesterday evening for seizure activity on EEG.  Currently on Propofol infusion but off midazolam  Objective: Vital signs: BP Readings from Last 1 Encounters:  10/27/19 (!) 97/59   Pulse Readings from Last 1 Encounters:  10/28/19 (!) 129   Resp Readings from Last 1 Encounters:  10/28/19 (!) 0   Temp Readings from Last 1 Encounters:  10/28/19 99.5 F (37.5 C)    Hemodynamics: CVP:  [6 mmHg-23 mmHg] 14 mmHg  Cardiac Output 5.7 L/min (2.8 L/min/m2) Mixed venous co-ox 85%  Physical Exam:  Rhythm:   Afib w/ HR 150  Breath sounds: coarse  Heart sounds:  tachy  Incisions:  Dressings dry, intact  Abdomen:  Soft, non-distended, reportedly + liquid BM  Extremities:  Warm, well-perfused, swollen  Chest tubes:  low volume thin serosanguinous output, no air leak    Intake/Output from previous day: 04/13 0701 - 04/14 0700 In: 3545.5 [I.V.:3072.2; IV Piggyback:473.4] Out: 830 [Urine:530; Chest Tube:300] Intake/Output this shift: Total I/O In: -  Out: 60 [Chest Tube:60]  Lab Results:  CBC: Recent Labs    10/27/19 1651 10/27/19 1651 10/28/19 0312 10/28/19 0355  WBC 7.2  --  9.0  --   HGB 11.8*   < > 11.9* 11.6*  HCT 34.8*   < > 35.5* 34.0*  PLT 66*  --  63*  --    < > = values in this interval not displayed.    BMET:  Recent Labs    10/27/19 1651 10/27/19 1651 10/28/19 0312 10/28/19 0355  NA 137   < > 138 138  K 4.1   < > 4.3 4.1  CL 100  --  100  --   CO2 22  --  23  --   GLUCOSE 94  --  120*  --   BUN 48*  --  51*  --   CREATININE 3.11*  --  3.12*  --   CALCIUM 8.5*  --  8.6*  --    < > =  values in this interval not displayed.     PT/INR:   Recent Labs    10/28/19 0312  LABPROT 14.5  INR 1.1    CBG (last 3)  Recent Labs    10/28/19 0052 10/28/19 0352 10/28/19 0755  GLUCAP 99 113* 101*    ABG    Component Value Date/Time   PHART 7.361 10/28/2019 0355   PCO2ART 44.2 10/28/2019 0355   PO2ART 79.0 (L) 10/28/2019 0355   HCO3 25.1 10/28/2019 0355   TCO2 26 10/28/2019 0355   ACIDBASEDEF 1.0 10/28/2019 0355   O2SAT 85.0 10/28/2019 0410    CXR: PORTABLE CHEST 1 VIEW  COMPARISON:  Chest x-ray 10/27/2019.  FINDINGS: Three right-sided chest tubes, 1 left-sided chest tube and 2 midline drains (presumably mediastinal/pericardial) are again noted, stable in position very poor aeration in the right lung again noted. There is a left upper extremity PICC with tip terminating in the superior cavoatrial junction. An endotracheal tube is in place with tip 4.6 cm above the carina. Left lung appears low volume, but is  generally clear, with exception of some subsegmental atelectasis at the left base. No pleural effusion. No appreciable pneumothorax. Heart size is mildly enlarged. Mediastinal contours are distorted by patient positioning. Postoperative changes of tricuspid annuloplasty. Epicardial pacing wires are noted.  IMPRESSION: 1. Unchanged radiographic appearance of the chest, as detailed above. Persistent opacity throughout the right hemithorax may reflect areas of atelectasis and/or consolidation in the lung with superimposed right pleural effusion.   Electronically Signed   By: Vinnie Langton M.D.   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

## 2019-10-28 NOTE — Progress Notes (Signed)
Patient ID: [name], female   DOB: [birth year], 49 y.o.   MRN: [id number] [facility] Progress Note   Assessment/ Plan:   1. Acute kidney Injury on chronic kidney disease stage III: With underlying chronic kidney disease from lupus nephritis and now having suffered acute renal injury possibly hemodynamically mediated based on preceding events/history.  She has been unresponsive to escalation of diuretics overnight and continues to have significant volume excess.  We will begin CRRT today for management of azotemia and volume excess. 2.  Multifactorial shock: With hemorrhagic component perioperatively for tricuspid valve replacement and thereafter cardiogenic shock.  Improved and now off of VA ECMO.  With significant volume excess that likely is compounding cardiogenic shock and will start her today on CRRT. 3.  Anemia: With coagulopathy and significant perioperative blood loss status post multiple units of PRBC/blood products.  Current hemoglobin and hematocrit within acceptable range. 4.  Possible seizure activity: Would be interesting to assess impact of CRRT on azotemia and seizures.  Management per neurology.  Subjective:   With poor urine output overnight and intermittent seizure-like activity noted.   Objective:   BP (!) 97/59   Pulse (!) 129   Temp 99.5 F (37.5 C)   Resp (!) 0   Ht '5\' 7"'$  (1.702 m)   Wt 98.5 kg   LMP 04/02/2017   SpO2 100%   BMI 34.01 kg/m   Intake/Output Summary (Last 24 hours) at 10/28/2019 7829 Last data filed at 10/28/2019 5621 Gross per 24 hour  Intake 3380.11 ml  Output 870 ml  Net 2510.11 ml   Weight change:   Physical Exam: Gen: Intubated, sedated, appears comfortable with ongoing EEG monitoring CVS: Pulse regular tachycardia, S1 and S2 normal Resp: Distant breath sounds bilaterally without distinct rales or rhonchi Abd: Soft, obese, nontender Ext: 2-3+ pitting edema bilaterally over upper and lower  extremities  Imaging: DG CHEST PORT 1 VIEW  Result Date: 10/27/2019 CLINICAL DATA:  PICC line placement. EXAM: PORTABLE CHEST 1 VIEW COMPARISON:  Chest x-ray from same day at 0510 hours. FINDINGS: New left upper extremity PICC line with tip near the cavoatrial junction. Unchanged endotracheal and enteric tubes. Unchanged left internal jugular central venous catheter. Unchanged bilateral chest tubes and mediastinal drains. Stable cardiomediastinal silhouette status post tricuspid valve replacement. Unchanged airspace disease throughout the majority of the right lung. Unchanged left basilar atelectasis and small right greater than left pleural effusions. Pleural thickening along the medial right lung apex again noted. No pneumothorax. No acute osseous abnormality. Chronic avascular necrosis of both humeral heads again noted. IMPRESSION: 1. New left upper extremity PICC line with tip near the cavoatrial junction. 2. Otherwise stable chest. Electronically Signed   By: Titus Dubin M.D.   On: 10/27/2019 11:31   DG Chest Port 1 View  Addendum Date: 10/27/2019   ADDENDUM REPORT: 10/27/2019 10:57 ADDENDUM: Comment: There is a spring-type device in or overlying the lateral right hemithorax, not present 1 day prior. There is concern that this spring could possibly reside within the patient. It was not present on radiograph from 1 day prior. Particular attention this area on subsequent evaluations warranted in this regard. Electronically Signed   By: Lowella Grip III M.D.   On: 10/27/2019 10:57   Result Date: 10/27/2019 CLINICAL DATA:  Hypoxia EXAM: PORTABLE CHEST 1 VIEW COMPARISON:  October 26, 2019 FINDINGS: Endotracheal tube tip is 4.2 cm above the carina. There are bilateral chest tubes and mediastinal drains, unchanged in position. Central catheter  tip is at the junction of the left innominate vein and superior vena cava. No pneumothorax appreciable. There is consolidation throughout most of the right  lung with small right pleural effusion. There is atelectatic change in the left base. The left lung is otherwise clear. Heart size is upper normal in size with pulmonary vascularity normal. No adenopathy. Status post tricuspid valve repair. No bone lesions. IMPRESSION: Tube and catheter positions as described. No evident pneumothorax. Opacification throughout much of the right lung with right pleural effusion. Mild left base atelectasis. Stable cardiac silhouette. Appearance similar to 1 day prior. Electronically Signed: By: Lowella Grip III M.D. On: 10/27/2019 08:51   DG Chest Port 1 View  Result Date: 11/03/2019 CLINICAL DATA:  Status post ECMO decannulation EXAM: PORTABLE CHEST 1 VIEW COMPARISON:  Film from earlier in the same day. FINDINGS: Endotracheal tube and gastric catheter are again noted in satisfactory position. Left jugular sheath is seen with a catheter extending into the left innominate vein. Multiple right-sided thoracostomy tubes are noted. These are stable in appearance from the prior exam. Pericardial drain and mediastinal drain are noted as well as a left thoracostomy tube. Postsurgical changes are seen and stable. Cardiac shadow is stable. Patchy perihilar and basilar opacity is again seen on the right relatively stable from the prior exam. Some slight increase in left perihilar opacity is noted. No pneumothorax is seen. No sizable effusion is noted. ECMO cannulas have been removed in the interval. IMPRESSION: Patchy airspace opacities are right greater than left relatively stable from the prior exam with the exception of slight increase on the left. Interval removal of ECMO cannulas. Tubes and lines as described. Electronically Signed   By: Inez Catalina M.D.   On: 11/03/2019 14:08   [place] EKG SITE RITE  Result Date: 10/27/2019 If Site Rite image not attached, placement could not be confirmed due to current cardiac rhythm.   Labs: BMET Recent Labs  Lab 10/24/19 0356  10/24/19 0407 10/25/19 0510 10/25/19 0519 10/25/19 1706 10/25/19 1715 11/12/2019 0406 11/12/2019 6270 10/31/2019 1833 10/24/2019 1834 10/27/19 3500 10/27/19 0421 10/27/19 0626 10/27/19 0920 10/27/19 1651 10/28/19 0312 10/28/19 0355  NA 150*   < > 145   < > 145   < > 143   < > 142   < > 141 141 142 140 137 138 138  K 4.0   < > 3.9   < > 3.7   < > 4.1   < > 4.1   < > 4.5 4.4 4.2 4.1 4.1 4.3 4.1  CL 115*   < > 109  --  107  --  104  --  104  --  103  --   --   --  100 100  --   CO2 25   < > 26  --  28  --  28  --  26  --  26  --   --   --  22 23  --   GLUCOSE 86   < > 87  --  93  --  235*  --  104*  --  72  --   --   --  94 120*  --   BUN 31*   < > 34*  --  35*  --  40*  --  42*  --  44*  --   --   --  48* 51*  --   CREATININE 2.49*   < > 2.57*  --  2.30*  --  2.48*  --  2.41*  --  2.80*  --   --   --  3.11* 3.12*  --   CALCIUM 7.7*   < > 8.5*  --  8.6*  --  8.5*  --  8.2*  --  8.5*  --   --   --  8.5* 8.6*  --   PHOS 6.6*  --   --   --   --   --   --   --   --   --   --   --   --   --   --  8.4*  --    < > = values in this interval not displayed.   CBC Recent Labs  Lab 10/18/2019 1749 11/09/2019 2028 11/12/2019 1833 11/04/2019 1834 10/27/19 0412 10/27/19 0421 10/27/19 0920 10/27/19 1651 10/28/19 0312 10/28/19 0355  WBC 3.0*   < > 6.6  --  6.7  --   --  7.2 9.0  --   NEUTROABS 2.5  --   --   --   --   --   --   --   --   --   HGB 7.7*   < > 11.2*   < > 11.1*   < > 10.5* 11.8* 11.9* 11.6*  HCT 23.3*   < > 33.7*   < > 33.2*   < > 31.0* 34.8* 35.5* 34.0*  MCV 86.6   < > 88.7  --  90.2  --   --  87.7 87.4  --   PLT 77*   < > 107*  --  83*  --   --  66* 63*  --    < > = values in this interval not displayed.    Medications:    . bisacodyl  10 mg Oral Daily   Or  . bisacodyl  10 mg Rectal Daily  . chlorhexidine gluconate (MEDLINE KIT)  15 mL Mouth Rinse BID  . Chlorhexidine Gluconate Cloth  6 each Topical Daily  . feeding supplement (PIVOT 1.5 CAL)  1,000 mL Per Tube Q24H  . insulin  aspart  0-24 Units Subcutaneous Q4H  . levothyroxine  50 mcg Per Tube Q0600  . mouth rinse  15 mL Mouth Rinse 10 times per day  . metolazone  5 mg Oral Daily  . pantoprazole (PROTONIX) IV  40 mg Intravenous QHS  . sodium chloride flush  10-40 mL Intracatheter Q12H  . sodium chloride flush  10-40 mL Intracatheter Q12H   Elmarie Shiley, MD 10/28/2019, 8:21 AM

## 2019-10-28 NOTE — Procedures (Signed)
Hemodialysis Insertion Procedure Note DORETTE HARTEL [id number] Mar 08, 1971  Procedure: Insertion of Hemodialysis Catheter Type: 3 port  Indications: Hemodialysis   Procedure Details Consent: Risks of procedure as well as the alternatives and risks of each were explained to the (patient/caregiver).  Consent for procedure obtained. Time Out: Verified patient identification, verified procedure, site/side was marked, verified correct patient position, special equipment/implants available, medications/allergies/relevent history reviewed, required imaging and test results available.  Performed  Maximum sterile technique was used including antiseptics, cap, gloves, gown, hand hygiene, mask and sheet. Skin prep: Chlorhexidine; local anesthetic administered A antimicrobial bonded/coated triple lumen catheter was placed in the right internal jugular vein using the Seldinger technique. Ultrasound guidance used.Yes.   Catheter placed to 16 cm. Blood aspirated via all 3 ports and then flushed x 3. Line sutured x 2 and dressing applied.  Evaluation Blood flow good Complications: No apparent complications Patient did tolerate procedure well. Chest X-ray ordered to verify placement.  CXR: pending.  Marianna Payment, D.O. [facility], PGY-1 Pager: [phone], Phone: [phone] Date 10/28/2019 Time 10:19 AM

## 2019-10-28 NOTE — Progress Notes (Signed)
Subjective: Were attempting to wean off Versed and propofol but EEG started to get worse again.  Therefore patient was loaded with IV phenobarb.  Will attempt to wean off propofol today. Also started CRRT today. Husband at bedside.  ROS: Unable to obtain due to poor mental status  Examination  Vital signs in last 24 hours: Temp:  [96.4 F (35.8 C)-99.5 F (37.5 C)] 98.1 F (36.7 C) (04/14 1123) Pulse Rate:  [110-133] 129 (04/14 0258) Resp:  [0-26] 0 (04/14 0800) BP: (97)/(59) 97/59 (04/13 1519) SpO2:  [93 %-100 %] 98 % (04/14 1052) Arterial Line BP: (87-125)/(53-78) 104/53 (04/14 0800) FiO2 (%):  [50 %] 50 % (04/14 1052)  General: lying in bed,not in apparent distress CVS: pulse-normal rate,irregular rhythm RS: breathing comfortably,intubated Extremities:Edematous  Neuro: XT:GGYIRSWN, does not open eyes to noxious stimuli CN: pupils equal, round and reactive to light, corneal reflex absent, gag reflex absent Motor:Does not withdraw to noxious stimuliin all 4 extremities,flaccid   Basic Metabolic Panel: Recent Labs  Lab 11/06/2019 1752 10/18/2019 1803 10/24/19 0356 10/24/19 0407 10/15/2019 0406 10/16/2019 4627 11/08/2019 1833 10/25/2019 1834 10/27/19 0350 10/27/19 0421 10/27/19 0626 10/27/19 0920 10/27/19 1651 10/28/19 0312 10/28/19 0355  NA 151*   < > 150*   < > 143   < > 142   < > 141   < > 142 140 137 138 138  K 3.9   < > 4.0   < > 4.1   < > 4.1   < > 4.5   < > 4.2 4.1 4.1 4.3 4.1  CL 117*   < > 115*   < > 104  --  104  --  103  --   --   --  100 100  --   CO2 25   < > 25   < > 28  --  26  --  26  --   --   --  22 23  --   GLUCOSE 68*   < > 86   < > 235*  --  104*  --  72  --   --   --  94 120*  --   BUN 28*   < > 31*   < > 40*  --  42*  --  44*  --   --   --  48* 51*  --   CREATININE 2.25*   < > 2.49*   < > 2.48*  --  2.41*  --  2.80*  --   --   --  3.11* 3.12*  --   CALCIUM 7.3*   < > 7.7*   < > 8.5*   < > 8.2*   < > 8.5*  --   --   --  8.5* 8.6*  --   MG 2.2   --  2.1  --   --   --  2.4  --  2.3  --   --   --  2.4  --   --   PHOS  --   --  6.6*  --   --   --   --   --   --   --   --   --   --  8.4*  --    < > = values in this interval not displayed.    CBC: Recent Labs  Lab 10/19/2019 1749 11/11/2019 2028 10/20/2019 1348 10/16/2019 1354 10/31/2019 1833 11/03/2019 1834 10/27/19 0938 10/27/19 0421 10/27/19 1829 10/27/19 0920  10/27/19 1651 10/28/19 0312 10/28/19 0355  WBC 3.0*   < > 6.0  --  6.6  --  6.7  --   --   --  7.2 9.0  --   NEUTROABS 2.5  --   --   --   --   --   --   --   --   --   --   --   --   HGB 7.7*   < > 10.8*   < > 11.2*   < > 11.1*   < > 10.5* 10.5* 11.8* 11.9* 11.6*  HCT 23.3*   < > 32.1*   < > 33.7*   < > 33.2*   < > 31.0* 31.0* 34.8* 35.5* 34.0*  MCV 86.6   < > 89.7  --  88.7  --  90.2  --   --   --  87.7 87.4  --   PLT 77*   < > 126*  --  107*  --  83*  --   --   --  66* 63*  --    < > = values in this interval not displayed.     Coagulation Studies: Recent Labs    11/03/2019 0406 10/19/2019 1348 10/27/19 0412 10/28/19 0312  LABPROT 13.7 14.2 14.8 14.5  INR 1.1 1.1 1.2 1.1    Imaging No new brain imaging overnight  ASSESSMENT AND PLAN: 49 year old female with SLE, tricuspid valve repair followed by hemorrhagic shock was on ECMO and was noted to have facial twitching movements on 10/27/2019, EEG showed focal convulsive status epilepticus.  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 Additionally,EEG showedsevere diffuse encephalopathy, non specific to etiology but most likely secondary to sedation.No definite seizures were seen. - Given status epilepticus and concern for hypoxic brain  injury, will need MRI and neuro exam off sedation for prognostication  Recommendations - Continue Keppra '1000mg'$  BID, Vimpat '100mg'$  BID. -Phenobarbital level ordered and pending.  If within therapeutic limits, will start maintenance dosing. -Currently on propofol at 40 mcg/hr. We will start weaning propofol at 30mg/ hour -If patient has any more seizures, may need to add fourth antiepileptic like topiramate - Continue LTM EEG to help titration of sedation for seizure management - MRI brain wo contrastto assess for hypoxic/anoxic brain injury after LTM EEG is discontinued - continue seizure precautions - PRN IV versed '5mg'$  for clinical seizure like activity - Management of rest of the comorbidities per primary team - Met with husband at bedside and updated about neurologic diagnosis and management plan  CRITICAL CARE Performed by: PLora Havens Total critical care time:343mutes  Critical care time was exclusive of separately billable procedures and treating other patients.  Critical care was necessary to treat or prevent imminent or life-threatening deterioration.  Critical care was time spent personally by me on the following activities: development of treatment plan with patient and/or surrogate as well as nursing, discussions with consultants, evaluation of patient's response to treatment, examination of patient, obtaining history from patient or surrogate, ordering and performing treatments and interventions, ordering and review of laboratory studies, ordering and review of radiographic studies, pulse oximetry and re-evaluation of patient's condition.   PrZeb Comfortpilepsy [facility] For questions after 5pm please refer to AMION to  reach the Neurologist on call

## 2019-10-29 ENCOUNTER — Inpatient Hospital Stay (HOSPITAL_COMMUNITY): Payer: BC Managed Care – PPO

## 2019-10-29 DIAGNOSIS — J9601 Acute respiratory failure with hypoxia: Secondary | ICD-10-CM | POA: Diagnosis not present

## 2019-10-29 DIAGNOSIS — I50813 Acute on chronic right heart failure: Secondary | ICD-10-CM | POA: Diagnosis not present

## 2019-10-29 DIAGNOSIS — G40901 Epilepsy, unspecified, not intractable, with status epilepticus: Secondary | ICD-10-CM | POA: Diagnosis not present

## 2019-10-29 DIAGNOSIS — Z95811 Presence of heart assist device: Secondary | ICD-10-CM

## 2019-10-29 LAB — TYPE AND SCREEN
ABO/RH(D): A POS
Antibody Screen: NEGATIVE
DAT, IgG: NEGATIVE
Unit division: 0
Unit division: 0
Unit division: 0
Unit division: 0
Unit division: 0
Unit division: 0
Unit division: 0
Unit division: 0
Unit division: 0
Unit division: 0
Unit division: 0

## 2019-10-29 LAB — CBC
HCT: 34.7 % — ABNORMAL LOW (ref 36.0–46.0)
Hemoglobin: 11.6 g/dL — ABNORMAL LOW (ref 12.0–15.0)
MCH: 29.4 pg (ref 26.0–34.0)
MCHC: 33.4 g/dL (ref 30.0–36.0)
MCV: 87.8 fL (ref 80.0–100.0)
Platelets: 37 10*3/uL — ABNORMAL LOW (ref 150–400)
RBC: 3.95 MIL/uL (ref 3.87–5.11)
RDW: 16.1 % — ABNORMAL HIGH (ref 11.5–15.5)
WBC: 9.5 10*3/uL (ref 4.0–10.5)
nRBC: 0.3 % — ABNORMAL HIGH (ref 0.0–0.2)

## 2019-10-29 LAB — COOXEMETRY PANEL
Carboxyhemoglobin: 2.1 % — ABNORMAL HIGH (ref 0.5–1.5)
Carboxyhemoglobin: 1.4 % (ref 0.5–1.5)
Methemoglobin: 1.8 % — ABNORMAL HIGH (ref 0.0–1.5)
Methemoglobin: 1.2 % (ref 0.0–1.5)
O2 Saturation: 82.3 %
O2 Saturation: 79.8 %
Total hemoglobin: 11.8 g/dL — ABNORMAL LOW (ref 12.0–16.0)
Total hemoglobin: 12.4 g/dL (ref 12.0–16.0)

## 2019-10-29 LAB — HEPATIC FUNCTION PANEL
ALT: 26 U/L (ref 0–44)
AST: 39 U/L (ref 15–41)
Albumin: 2.8 g/dL — ABNORMAL LOW (ref 3.5–5.0)
Alkaline Phosphatase: 108 U/L (ref 38–126)
Bilirubin, Direct: 0.3 mg/dL — ABNORMAL HIGH (ref 0.0–0.2)
Indirect Bilirubin: 1 mg/dL — ABNORMAL HIGH (ref 0.3–0.9)
Total Bilirubin: 1.3 mg/dL — ABNORMAL HIGH (ref 0.3–1.2)
Total Protein: 5.5 g/dL — ABNORMAL LOW (ref 6.5–8.1)

## 2019-10-29 LAB — POCT I-STAT, CHEM 8
BUN: 16 mg/dL (ref 6–20)
Calcium, Ion: 1.06 mmol/L — ABNORMAL LOW (ref 1.15–1.40)
Chloride: 111 mmol/L (ref 98–111)
Creatinine, Ser: 0.9 mg/dL (ref 0.44–1.00)
Glucose, Bld: 98 mg/dL (ref 70–99)
HCT: 22 % — ABNORMAL LOW (ref 36.0–46.0)
Hemoglobin: 7.5 g/dL — ABNORMAL LOW (ref 12.0–15.0)
Potassium: 6.8 mmol/L (ref 3.5–5.1)
Sodium: 137 mmol/L (ref 135–145)
TCO2: 24 mmol/L (ref 22–32)

## 2019-10-29 LAB — BPAM RBC
Blood Product Expiration Date: 202105102359
Blood Product Expiration Date: 202105112359
Blood Product Expiration Date: 202105112359
Blood Product Expiration Date: 202105122359
Blood Product Expiration Date: 202105132359
Blood Product Expiration Date: 202105152359
Blood Product Expiration Date: 202105172359
Blood Product Expiration Date: 202105182359
Blood Product Expiration Date: 202105182359
Blood Product Expiration Date: 202105192359
Blood Product Expiration Date: 202105202359
ISSUE DATE / TIME: 202104011431
ISSUE DATE / TIME: 202104121038
ISSUE DATE / TIME: 202104121038
ISSUE DATE / TIME: 202104121038
ISSUE DATE / TIME: 202104121038
Unit Type and Rh: 5100
Unit Type and Rh: 5100
Unit Type and Rh: 5100
Unit Type and Rh: 5100
Unit Type and Rh: 5100
Unit Type and Rh: 5100
Unit Type and Rh: 5100
Unit Type and Rh: 5100
Unit Type and Rh: 5100
Unit Type and Rh: 9500
Unit Type and Rh: 9500

## 2019-10-29 LAB — POCT I-STAT 7, (LYTES, BLD GAS, ICA,H+H)
Acid-Base Excess: 2 mmol/L (ref 0.0–2.0)
Bicarbonate: 26.9 mmol/L (ref 20.0–28.0)
Calcium, Ion: 1.15 mmol/L (ref 1.15–1.40)
HCT: 35 % — ABNORMAL LOW (ref 36.0–46.0)
Hemoglobin: 11.9 g/dL — ABNORMAL LOW (ref 12.0–15.0)
O2 Saturation: 99 %
Patient temperature: 36.8
Potassium: 4.3 mmol/L (ref 3.5–5.1)
Sodium: 136 mmol/L (ref 135–145)
TCO2: 28 mmol/L (ref 22–32)
pCO2 arterial: 44.3 mmHg (ref 32.0–48.0)
pH, Arterial: 7.391 (ref 7.350–7.450)
pO2, Arterial: 121 mmHg — ABNORMAL HIGH (ref 83.0–108.0)

## 2019-10-29 LAB — RENAL FUNCTION PANEL
Albumin: 2.8 g/dL — ABNORMAL LOW (ref 3.5–5.0)
Albumin: 2.4 g/dL — ABNORMAL LOW (ref 3.5–5.0)
Anion gap: 12 (ref 5–15)
Anion gap: 10 (ref 5–15)
BUN: 42 mg/dL — ABNORMAL HIGH (ref 6–20)
BUN: 43 mg/dL — ABNORMAL HIGH (ref 6–20)
CO2: 23 mmol/L (ref 22–32)
CO2: 24 mmol/L (ref 22–32)
Calcium: 8.2 mg/dL — ABNORMAL LOW (ref 8.9–10.3)
Calcium: 7.7 mg/dL — ABNORMAL LOW (ref 8.9–10.3)
Chloride: 101 mmol/L (ref 98–111)
Chloride: 103 mmol/L (ref 98–111)
Creatinine, Ser: 2.39 mg/dL — ABNORMAL HIGH (ref 0.44–1.00)
Creatinine, Ser: 2.21 mg/dL — ABNORMAL HIGH (ref 0.44–1.00)
GFR calc Af Amer: 27 mL/min — ABNORMAL LOW (ref >60)
GFR calc Af Amer: 30 mL/min — ABNORMAL LOW (ref >60)
GFR calc non Af Amer: 24 mL/min — ABNORMAL LOW (ref >60)
GFR calc non Af Amer: 26 mL/min — ABNORMAL LOW (ref >60)
Glucose, Bld: 138 mg/dL — ABNORMAL HIGH (ref 70–99)
Glucose, Bld: 125 mg/dL — ABNORMAL HIGH (ref 70–99)
Phosphorus: 6.5 mg/dL — ABNORMAL HIGH (ref 2.5–4.6)
Phosphorus: 5.5 mg/dL — ABNORMAL HIGH (ref 2.5–4.6)
Potassium: 4.4 mmol/L (ref 3.5–5.1)
Potassium: 3.9 mmol/L (ref 3.5–5.1)
Sodium: 136 mmol/L (ref 135–145)
Sodium: 137 mmol/L (ref 135–145)

## 2019-10-29 LAB — MAGNESIUM: Magnesium: 2.4 mg/dL (ref 1.7–2.4)

## 2019-10-29 LAB — PROTIME-INR
INR: 1.1 (ref 0.8–1.2)
Prothrombin Time: 13.8 seconds (ref 11.4–15.2)

## 2019-10-29 LAB — GLUCOSE, CAPILLARY
Glucose-Capillary: 111 mg/dL — ABNORMAL HIGH (ref 70–99)
Glucose-Capillary: 125 mg/dL — ABNORMAL HIGH (ref 70–99)
Glucose-Capillary: 139 mg/dL — ABNORMAL HIGH (ref 70–99)
Glucose-Capillary: 140 mg/dL — ABNORMAL HIGH (ref 70–99)
Glucose-Capillary: 74 mg/dL (ref 70–99)
Glucose-Capillary: 89 mg/dL (ref 70–99)

## 2019-10-29 MED ORDER — SODIUM CHLORIDE 0.9% FLUSH: 3.0000 mL | INTRAVENOUS | Status: DC | PRN

## 2019-10-29 MED ORDER — IMMUNE GLOBULIN (HUMAN) 10 GM/100ML IV SOLN
400.0000 mg/kg | INTRAVENOUS | Status: AC
  Administered 2019-10-29 – 2019-11-02 (×5): 40 g via INTRAVENOUS
  Filled 2019-10-29: qty 200
  Filled 2019-10-29 (×5): qty 400

## 2019-10-29 MED ORDER — DIPHENHYDRAMINE HCL 12.5 MG/5ML PO ELIX: 25.0000 mg | ORAL_SOLUTION | ORAL | Status: AC | PRN

## 2019-10-29 MED ORDER — SODIUM CHLORIDE 0.9% FLUSH: 3.0000 mL | Freq: Two times a day (BID) | INTRAVENOUS | Status: DC

## 2019-10-29 MED ORDER — ACETAMINOPHEN 160 MG/5ML PO SOLN
650.0000 mg | ORAL | Status: AC | PRN
  Administered 2019-10-30: 650 mg
  Filled 2019-10-29: qty 20.3

## 2019-10-29 MED ORDER — SODIUM CHLORIDE 0.9 % IV SOLN: 250.0000 mL | INTRAVENOUS | Status: DC | PRN

## 2019-10-29 NOTE — Progress Notes (Signed)
Subjective: Propofol was weaned off yesterday.  No clinical seizure-like activity.  This morning, while removing pacing wires, one of the wires partially fractured and therefore patient will not be candidate for MRI anymore.  ROS: unable to obtain due to poor mental status  Examination  Vital signs in last 24 hours: Temp:  [97 F (36.1 C)-99.1 F (37.3 C)] 97.9 F (36.6 C) (04/15 1221) Pulse Rate:  [110-130] 110 (04/15 1105) Resp:  [0-33] 28 (04/15 1221) BP: (108-110)/(66-68) 110/66 (04/15 0737) SpO2:  [59 %-100 %] 98 % (04/15 1221) Arterial Line BP: (76-128)/(48-76) 124/74 (04/15 1221) FiO2 (%):  [50 %] 50 % (04/15 1105) Weight:  [95.8 kg] 95.8 kg (04/15 0500)  General: lying in bed,not in apparent distress CVS: pulse-normal rate,irregular rhythm RS: breathing comfortably,intubated Extremities:Edematous  Neuro: WG:NFAOZHYQ, does not open eyes to noxious stimuli CN: pupils equal,round and reactive to light, corneal reflex absent, gag reflex present Motor:Does not withdraw to noxious stimuliin all 4 extremities,flaccid  Basic Metabolic Panel: Recent Labs  Lab 10/24/19 0356 10/24/19 0407 10/31/2019 1833 11/01/2019 1834 10/27/19 0412 10/27/19 0421 10/27/19 1651 10/27/19 1651 10/28/19 0312 10/28/19 0312 10/28/19 0355 10/28/19 1640 10/28/19 1841 10/29/19 0323 10/29/19 0346  NA 150*   < > 142   < > 141   < > 137   < > 138   < > 138 138 137 136 136  K 4.0   < > 4.1   < > 4.5   < > 4.1   < > 4.3   < > 4.1 4.4 4.1 4.4 4.3  CL 115*   < > 104   < > 103  --  100  --  100  --   --  101  --  101  --   CO2 25   < > 26   < > 26  --  22  --  23  --   --  24  --  23  --   GLUCOSE 86   < > 104*   < > 72  --  94  --  120*  --   --  160*  --  138*  --   BUN 31*   < > 42*   < > 44*  --  48*  --  51*  --   --  47*  --  42*  --   CREATININE 2.49*   < > 2.41*   < > 2.80*  --  3.11*  --  3.12*  --   --  2.76*  --  2.39*  --   CALCIUM 7.7*   < > 8.2*   < > 8.5*   < > 8.5*   < > 8.6*   --   --  8.3*  --  8.2*  --   MG 2.1  --  2.4  --  2.3  --  2.4  --   --   --   --   --   --  2.4  --   PHOS 6.6*  --   --   --   --   --   --   --  8.4*  --   --  7.4*  --  6.5*  --    < > = values in this interval not displayed.    CBC: Recent Labs  Lab 11/06/2019 1833 11/03/2019 1834 10/27/19 6578 10/27/19 0421 10/27/19 1651 10/27/19 1651 10/28/19 4696 10/28/19 0355 10/28/19 1841 10/29/19 0323 10/29/19 2952  WBC 6.6  --  6.7  --  7.2  --  9.0  --   --  9.5  --   HGB 11.2*   < > 11.1*   < > 11.8*   < > 11.9* 11.6* 11.2* 11.6* 11.9*  HCT 33.7*   < > 33.2*   < > 34.8*   < > 35.5* 34.0* 33.0* 34.7* 35.0*  MCV 88.7  --  90.2  --  87.7  --  87.4  --   --  87.8  --   PLT 107*  --  83*  --  66*  --  63*  --   --  37*  --    < > = values in this interval not displayed.     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  No definite seizures overnight however EEG showing frequent left and central spikes.  Recommendations - Continue Keppra 1000mg  BID, Vimpat 100mg  BID and phenobarbital 130 mg twice daily -We will continue LTM EEG for another 24 hours to look for any intermittent seizures -Ideally would like to obtain MRI brain without contrast to look for any anoxic/hypoxic  brain injury, other acute abnormalities.  However unfortunately due to pacing wires patient will not be a candidate for MRI anymore. -We will obtain CT head without contrast to look for any acute abnormality -We will need daily neuro exams for neuro prognostication.  Discussed with nurse and primary team to keep patient off of sedation as much as possible. - continue seizure precautions - PRN IV versed 5mg  for clinical seizure like activity -Management of rest of the comorbidities per primary team  CRITICAL CARE Performed by: Lora Havens  Total critical care time:34minutes  Critical care time was exclusive of separately billable procedures and treating other patients.  Critical care was necessary to treat or prevent imminent or life-threatening deterioration.  Critical care was time spent personally by me on the following activities: development of treatment plan with patient and/or surrogate as well as nursing, discussions with consultants, evaluation of patient's response to treatment, examination of patient, obtaining history from patient or surrogate, ordering and performing treatments and interventions, ordering and review of laboratory studies, ordering and review of radiographic studies, pulse oximetry and re-evaluation of patient's condition.   Zeb Comfort Epilepsy [facility] For questions after 5pm please refer to AMION to reach the Neurologist on call

## 2019-10-29 NOTE — Progress Notes (Signed)
      [address]       [place] [address]             [phone]      Intubated, sedated  BP 121/71   Pulse (!) 126   Temp 99.7 F (37.6 C)   Resp (!) 27   Ht 5\' 7"  (1.702 m)   Wt 95.8 kg   LMP 04/02/2017   SpO2 96%   BMI 33.08 kg/m  Atrial fib CI= 3.0 on milrinone 0.125, norepi @ 3  Co-ox= 79  Intake/Output Summary (Last 24 hours) at 10/29/2019 1703 Last data filed at 10/29/2019 1600 Gross per 24 hour  Intake 2689.54 ml  Output 5279 ml  Net -2589.46 ml   Remains critically ill  Continue current care  Laurice Iglesia C. Roxan Hockey, MD [facility] [phone]

## 2019-10-29 NOTE — Progress Notes (Signed)
Orthopedic Tech Progress Note Patient Details:  [name] 04/11/1971 [id number]  Ortho Devices Type of Ortho Device: Haematologist Ortho Device/Splint Location: BLE Ortho Device/Splint Interventions: Ordered, Application   Post Interventions Patient Tolerated: Well Instructions Provided: Care of device   Janit Pagan 10/29/2019, 3:35 PM

## 2019-10-29 NOTE — Progress Notes (Signed)
Called charge RN to assist with removing the patient's epicardial pacing wires. Upon first attempt pulling ventricular wires, this RN met resistance and this RN not comfortable with continuing to pull the wire. Charge RN at bedside assessed the wires & resistance in the patient and made second attempt to pull ventricular wires. Upon removal, one of the ventricular wires broke. There was a small tip of wire about 1cm at the exit site. This RN used small clamps to clamp the wire externally in attempt to prevent wire retention. This RN paged the surgical PA, Tacy Dura. Tacy Dura PA came to the bedside to assist RN. See note. Patient vital signs have remained stable. Dr. Roxy Manns is aware. CT obtained, instead of planned MRI.   Per radiologist, pt will no longer be candidate for MRI.    Luberta Mutter RN

## 2019-10-29 NOTE — Procedures (Addendum)
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 EEGto evaluate for seizure  Level of alertness:comatose  AEDs during EEG study:keppra, vimpat, phenobarb  Technical aspects: This EEG study was done with scalp electrodes positioned according to the 10-20 International system of electrode placement. Electrical activity was acquired at a sampling rate of 500Hz  and reviewed with a high frequency filter of 70Hz  and a low frequency filter of 1Hz . EEG data were recorded continuously and digitally stored.  DESCRIPTION:EEG showed continuous low amplitude 2-3Hz  delta slowing as well as frequent sharp waves in left frontocentral region.Hyperventilation and photic stimulation were not performed.  ABNORMALITY - Sharp waves, Left frontocentral region - Continuous slow, generalized  IMPRESSION: This studyshowed evidenceof epilepsyarising from left frontocentral region.Additionally, EEG showedsevere diffuse encephalopathy, non specific to etiologybut most likely secondary to sedation.No definite seizures were seen.    Lamiracle Chaidez Barbra Sarks

## 2019-10-29 NOTE — Progress Notes (Addendum)
I was called by nurse as she had difficulty (met resistance) with removal of pacing wires. Atrial wires were removed;however, a second attempt was made to remove ventricular wires. There was a small hemostat on tip of wire still exposed at skin. I lifted up the hemostat and the wires retracted back under the skin (the wire was likely partially fractured with attempted removal). Patient's vital signs have remained stable. Dr. Roxy Manns made aware.  I spoke with someone in radiology who asked a tech if patient able to undergo an MRI. With retained pacing wires, patient will be unable to have MRI.

## 2019-10-29 NOTE — Progress Notes (Signed)
[address]       [place] [address]             [phone]        CARDIOTHORACIC SURGERY PROGRESS NOTE   R3 Days Post-Op Procedure(s) (LRB): MEDIASTERNAL WASH OUT,  ECMO DECANNULATION AND STERNAL CLOSURE (N/A) TRANSESOPHAGEAL ECHOCARDIOGRAM (TEE) (N/A)  Subjective: Reported to have more seizures overnight.  Remains comatose on vent.  Objective: Vital signs: BP Readings from Last 1 Encounters:  10/29/19 110/66   Pulse Readings from Last 1 Encounters:  10/29/19 (!) 128   Resp Readings from Last 1 Encounters:  10/29/19 (!) 32   Temp Readings from Last 1 Encounters:  10/29/19 98.6 F (37 C)    Hemodynamics: CVP:  [11 mmHg-17 mmHg] 17 mmHg  Mixed venous co-ox 82%   Physical Exam:  Rhythm:   AF w/ HR 130  Breath sounds: coarse  Heart sounds:  tachy  Incisions:  Dressings dry, intact  Abdomen:  Soft, non-distended  Extremities:  Warm, well-perfused, swollen  Chest tubes:  low volume thin serosanguinous output, no air leak    Intake/Output from previous day: 04/14 0701 - 04/15 0700 In: 4001.5 [I.V.:2422.6; NG/GT:1255.5; IV Piggyback:323.3] Out: 0254 [Urine:205; Emesis/NG output:100; Chest Tube:310] Intake/Output this shift: No intake/output data recorded.  Lab Results:  CBC: Recent Labs    10/28/19 0312 10/28/19 0355 10/29/19 0323 10/29/19 0346  WBC 9.0  --  9.5  --   HGB 11.9*   < > 11.6* 11.9*  HCT 35.5*   < > 34.7* 35.0*  PLT 63*  --  37*  --    < > = values in this interval not displayed.    BMET:  Recent Labs    10/28/19 1640 10/28/19 1841 10/29/19 0323 10/29/19 0346  NA 138   < > 136 136  K 4.4   < > 4.4 4.3  CL 101  --  101  --   CO2 24  --  23  --   GLUCOSE 160*  --  138*  --   BUN 47*  --  42*  --   CREATININE 2.76*  --  2.39*  --   CALCIUM 8.3*  --  8.2*  --    < > = values in this interval not displayed.     PT/INR:   Recent Labs    10/29/19 0323  LABPROT 13.8  INR 1.1    CBG (last 3)    Recent Labs    10/28/19 2308 10/29/19 0343 10/29/19 0738  GLUCAP 123* 125* 89    ABG    Component Value Date/Time   PHART 7.391 10/29/2019 0346   PCO2ART 44.3 10/29/2019 0346   PO2ART 121.0 (H) 10/29/2019 0346   HCO3 26.9 10/29/2019 0346   TCO2 28 10/29/2019 0346   ACIDBASEDEF 1.0 10/28/2019 1841   O2SAT 99.0 10/29/2019 0346    CXR: n/a  Assessment/Plan: S/P Procedure(s) (LRB): MEDIASTERNAL WASH OUT,  ECMO DECANNULATION AND STERNAL CLOSURE (N/A) TRANSESOPHAGEAL ECHOCARDIOGRAM (TEE) (N/A)  Remains critically ill but relatively stable with prognosis guarded at best Remains in Afib w/ increased HR although better HR control than yesterday on IV amiodarone Vasodilated with low SVR and stable CVP on milrinone 0.25 nitric 30 ppm levophed down to 4 mcg/min - agree w/ weaning milrinone further VDRF stable on current vent settings w/ O2 sats 93-100% on 50% FiO2  Seizures, suspected anoxic brain injury of unclear severity - management of sedation and anti-seizure meds  per Neuro team - agree w/ plans for MRI Acute oliguric renal failure - tolerating CVVHD per Nephrology and I/O's balanced, weight 25 kg > preop Thrombocytopenia, recurrent, 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

## 2019-10-29 NOTE — Progress Notes (Signed)
Patient ID: [name], female   DOB: 1970/09/13, 49 y.o.   MRN: [id number]    Advanced Heart Failure Rounding Note  PCP-Cardiologist: No primary care provider on file.   Subjective:    Events: - 4/7 Underwent mini TV repair c/b diffuse coagulopathy and severe bleeding.  Received RBCS, PLTs, TXA, cryo, Factor 7, steroids - 4/7 take back to OR for bleeding and VA ECMO placement  - 4/8 Repeat take back to OR for washout - 4/9 Back to OR for right chest hematoma removal. TEE in OR showed EF 40-45%, mild-moderately decreased RV systolic function, s/p TV repair with mild TR.  - 4/10 EEG with status epilepticus. Head CT negative.  - 4/12 ECMO decannulated and chest closed. Atrial fibrillation in OR with epinephrine.   Remains on vent. Ongoing seizure activity, off Versed and Propofol but loaded on phenobarbital IV yesterday.   On milrinone 0.25, NE 5, NO 29. On CVVH, currently pulling about 200 cc/hr net with CVP 16-17 and co-ox 85%. CI 2.9 from FloTrac.   Afebrile, WBCs 9.5.  He is off abx.   Hgb 11.6 this am. PLTs 68 => 36 => 33 => 83 => 63 => 37.    Atrial fibrillation remains in 130s-150s range, on amiodarone 60 mg/hr.  BP preserved.    Objective:   Weight Range: 95.8 kg Body mass index is 33.08 kg/m.   Vital Signs:   Temp:  [97 F (36.1 C)-99.5 F (37.5 C)] 98.8 F (37.1 C) (04/15 0700) Pulse Rate:  [127-130] 127 (04/15 0305) Resp:  [0-31] 18 (04/15 0700) BP: (108-109)/(67-68) 108/68 (04/14 1600) SpO2:  [96 %-100 %] 100 % (04/15 0400) Arterial Line BP: (76-302)/(41-77) 104/63 (04/15 0700) FiO2 (%):  [50 %] 50 % (04/15 0400) Weight:  [95.8 kg] 95.8 kg (04/15 0500) Last BM Date: 10/27/19  Weight change: Filed Weights   10/28/2019 0545 10/27/19 0500 10/29/19 0500  Weight: 93.5 kg 98.5 kg 95.8 kg    Intake/Output:   Intake/Output Summary (Last 24 hours) at 10/29/2019 0732 Last data filed at 10/29/2019 0700 Gross per 24 hour  Intake 4001.46 ml  Output 4693 ml    Net -691.54 ml      Physical Exam    General: NAD Neck: JVP difficult, no thyromegaly or thyroid nodule.  Lungs: Clear to auscultation bilaterally with normal respiratory effort. CV: Nondisplaced PMI.  Heart tachy, irregular S1/S2, no S3/S4, no murmur.  1+ edema to knees.  Abdomen: Soft, nontender, no hepatosplenomegaly, no distention.  Skin: Intact without lesions or rashes.  Neurologic: Sedated on vent Extremities: No clubbing or cyanosis.  HEENT: Normal.    Telemetry   Atrial fibrillation 130s-150s (personally reviewed).    Labs    CBC Recent Labs    10/28/19 0312 10/28/19 0355 10/29/19 0323 10/29/19 0346  WBC 9.0  --  9.5  --   HGB 11.9*   < > 11.6* 11.9*  HCT 35.5*   < > 34.7* 35.0*  MCV 87.4  --  87.8  --   PLT 63*  --  37*  --    < > = values in this interval not displayed.   Basic Metabolic Panel Recent Labs    10/27/19 1651 10/28/19 0312 10/28/19 1640 10/28/19 1841 10/29/19 0323 10/29/19 0346  NA 137   < > 138   < > 136 136  K 4.1   < > 4.4   < > 4.4 4.3  CL 100   < > 101  --  101  --   CO2 22   < > 24  --  23  --   GLUCOSE 94   < > 160*  --  138*  --   BUN 48*   < > 47*  --  42*  --   CREATININE 3.11*   < > 2.76*  --  2.39*  --   CALCIUM 8.5*   < > 8.3*  --  8.2*  --   MG 2.4  --   --   --  2.4  --   PHOS  --    < > 7.4*  --  6.5*  --    < > = values in this interval not displayed.   Liver Function Tests Recent Labs    10/28/19 0312 10/28/19 0312 10/28/19 1640 10/29/19 0323  AST 38  --   --  39  ALT 27  --   --  26  ALKPHOS 88  --   --  108  BILITOT 1.2  --   --  1.3*  PROT 4.9*  --   --  5.5*  ALBUMIN 2.9*  3.2*   < > 2.8* 2.8*  2.8*   < > = values in this interval not displayed.   No results for input(s): LIPASE, AMYLASE in the last 72 hours. Cardiac Enzymes No results for input(s): CKTOTAL, CKMB, CKMBINDEX, TROPONINI in the last 72 hours.  BNP: BNP (last 3 results) Recent Labs    04/15/19 1101  BNP 248.7*    ProBNP  (last 3 results) No results for input(s): PROBNP in the last 8760 hours.   D-Dimer No results for input(s): DDIMER in the last 72 hours. Hemoglobin A1C No results for input(s): HGBA1C in the last 72 hours. Fasting Lipid Panel Recent Labs    10/28/19 1100  TRIG 84   Thyroid Function Tests No results for input(s): TSH, T4TOTAL, T3FREE, THYROIDAB in the last 72 hours.  Invalid input(s): FREET3  Other results:   Imaging    DG CHEST PORT 1 VIEW  Result Date: 10/28/2019 CLINICAL DATA:  49 year old female with history of tricuspid valve repair. Central line placement. EXAM: PORTABLE CHEST 1 VIEW COMPARISON:  Chest x-ray 10/28/2019. FINDINGS: An endotracheal tube is in place with tip 5.4 cm above the carina. Previously noted bilateral chest tubes and mediastinal/pericardial drains appear similarly positioned. Epicardial pacing wires again noted. A nasogastric tube is seen extending into the stomach, however, the tip of the nasogastric tube extends below the lower margin of the image. Very poor aeration throughout the right lung compatible with areas of atelectasis and/or consolidation as well as probable moderate right pleural effusion. No left pleural effusion. No evidence of pulmonary edema. Heart size is mildly enlarged. Status post tricuspid annuloplasty. IMPRESSION: 1. Postoperative changes and support apparatus, as above. Radiographic appearance the chest is otherwise essentially unchanged. Electronically Signed   By: Vinnie Langton M.D.   On: 10/28/2019 10:20     Medications:     Scheduled Medications: . B-complex with vitamin C  1 tablet Per Tube Daily  . bisacodyl  10 mg Oral Daily   Or  . bisacodyl  10 mg Rectal Daily  . chlorhexidine gluconate (MEDLINE KIT)  15 mL Mouth Rinse BID  . Chlorhexidine Gluconate Cloth  6 each Topical Daily  . feeding supplement (PIVOT 1.5 CAL)  1,000 mL Per Tube Q24H  . insulin aspart  0-24 Units Subcutaneous Q4H  . levothyroxine  50 mcg Per  Tube Q0600  .  mouth rinse  15 mL Mouth Rinse 10 times per day  . metolazone  5 mg Per Tube Daily  . pantoprazole (PROTONIX) IV  40 mg Intravenous QHS  . PHENObarbital  130 mg Intravenous BID  . sodium chloride flush  10-40 mL Intracatheter Q12H  . sodium chloride flush  10-40 mL Intracatheter Q12H    Infusions: .  prismasol BGK 4/2.5 500 mL/hr at 10/28/19 2327  .  prismasol BGK 4/2.5 300 mL/hr at 10/28/19 1300  . sodium chloride Stopped (10/27/19 1308)  . sodium chloride 10 mL/hr at 10/27/19 1722  . amiodarone 60 mg/hr (10/29/19 0700)  . dextrose 20 mL/hr at 10/29/19 0700  . furosemide (LASIX) infusion Stopped (10/28/19 0824)  . lacosamide (VIMPAT) IV Stopped (10/28/19 2257)  . levETIRAcetam Stopped (10/28/19 2319)  . midazolam (VERSED) infusion Stopped (10/27/19 1548)  . milrinone 0.25 mcg/kg/min (10/29/19 0700)  . norepinephrine (LEVOPHED) Adult infusion 4 mcg/min (10/29/19 0700)  . prismasol BGK 4/2.5 1,500 mL/hr at 10/29/19 0434  . propofol (DIPRIVAN) infusion Stopped (10/29/19 0415)    PRN Medications: sodium chloride, sodium chloride, artificial tears, dextrose, dextrose, heparin, heparin, metoprolol tartrate, midazolam, morphine injection, ondansetron (ZOFRAN) IV, sodium chloride flush, sodium chloride flush     Assessment/Plan   1. Post-cardiotomy hemorrhagic shock - Patient with refractory post-operative chest bleeding s/p minimally invasive TV repair on 11/12/2019 in setting of longstanding SLE - has received  RBCS, PLTs, TXA, cryo, Factor 7, steroids - ECMO started in OR - Repeat OR visit for right chest clot removal on 4/9 with improved hemodynamics, TEE with EF 40-45%, mild-moderately decreased RV function, stable replaced TV.  - ECMO decannulated 4/12.  - Stable now w/o overt bleeding.   2. CHF, primarily RV failure with cardiogenic shock - CVP 16-17 this morning with co-ox 82%, FloTrak CI 2.9. With anasarca.   - With adequate CI by FloTrak and tachycardia, will  decrease milrinone to 0.125 now. Can stop if rate continues high and CI stable.  - Hemodynamics are currently reasonably good, RV seems to be functioning reasonably well.  - Weaning NE, down to 5.   - Continue CVVH, pulling net 200 cc/hr and tolerating now.   3. 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  - plan as above  4. Post-operative respiratory failure - remains intubated, FiO2 0.5.   - Progressive airspace disease in R lung after mini thoracotomy, back to OR 4/9 for right chest clot removal.  - CCM following vent. - Vancomycin/cefepime stopped today, afebrile with normal WBCs.   5. Acute blood loss anemia - as above - continue to replace as needed  6. 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  - Plts down to 37 => IVIG x 3 days and will check HIT (though likely negative).   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  - She is now on Vimpat and Keppra.  - With ongoing seizures, she is now off Propofol and Versed, started phenobarbital IV.   - Head CT negative. - Concern for hypoxic injury => she will go for MRI w/o contrast of head today.   8. SLE - no change  9. AKI  - CVVH ongoing, pulling 200 cc/hr.   10. Nutrition - Tube feeds started.   11. DVT prophylaxis - SCDs  12. Atrial fibrillation - Noted with epinephrine administration in OR 4/12.  - Atypical flutter on 4/13 but rate-controlled, then rapid atrial fibrillation this  morning.  - Bolus amiodarone and increased gtt to 60 mg/hr.   - No anticoagulation yet per Dr. Roxy Manns, cardiovert only in event of emergency.  - Will try to get her off milrinone today, hopefully rate will improve.   CRITICAL CARE Performed by: Loralie Champagne  Total critical care time: 45 minutes  Critical care time was exclusive of separately billable procedures and treating other patients.  Critical care was necessary to  treat or prevent imminent or life-threatening deterioration.  Critical care was time spent personally by me (independent of midlevel providers or residents) on the following activities: development of treatment plan with patient and/or surrogate as well as nursing, discussions with consultants, evaluation of patient's response to treatment, examination of patient, obtaining history from patient or surrogate, ordering and performing treatments and interventions, ordering and review of laboratory studies, ordering and review of radiographic studies, pulse oximetry and re-evaluation of patient's condition.    Length of Stay: 8  Loralie Champagne, MD  10/29/2019, 7:32 AM  Advanced Heart Failure Team Pager [phone] (M-F; 7a - 4p)  Please contact [facility] for night-coverage after hours (4p -7a ) and weekends on [email or web address]

## 2019-10-29 NOTE — Progress Notes (Signed)
EEG maint complete. No skin breakdown at f8, fz f4. Continue to monitor

## 2019-10-29 NOTE — Procedures (Signed)
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 EEGto evaluate for seizure  Level of alertness:comatose  AEDs during EEG study:keppra, vimpat, phenobarb, propofol  Technical aspects: This EEG study was done with scalp electrodes positioned according to the 10-20 International system of electrode placement. Electrical activity was acquired at a sampling rate of 500Hz  and reviewed with a high frequency filter of 70Hz  and a low frequency filter of 1Hz . EEG data were recorded continuously and digitally stored.  DESCRIPTION:EEGinitially showedfrequent left frontocentral spikes. As sedation was titrated, eeg improved and showed generalizedbackground suppressionfor 10-15seconds alternating with 1-3seconds of 2-5hz  theta-delta slowing.Sharp waves were seen in left frontocentral region.  Hyperventilation and photic stimulation were not performed.  ABNORMALITY - Sharp waves. 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 Additionally,EEG showedsevere diffuse encephalopathy, non specific to etiology but most likely secondary to sedation.No definite seizures were seen.  [name] Barbra Sarks

## 2019-10-29 NOTE — Progress Notes (Signed)
RT x2 transported patient from 2H03 to CT and back with RN. No complications. RT will continue to monitor.

## 2019-10-29 NOTE — Progress Notes (Addendum)
Seen and examined in f/u for multifactorial shock after valve replacement. 10/29/19  No events overnight.  On CRRT.  Weaned from propofol and versed but EEG a little more irritated.  Neurology following closely.  Obtunded on vent Diffuse anasarca persists Ext warm Lungs diminished on R   ABGbenign Plat remains at 25, PEEP 5, DP 20 Remains on iNO Hgb stable Plts 107>>83>>66>>63>>37 CBGs 90-140 4/11 cortisol 7.6  A: -Multifactorial (hemorrhagic, cardiac) shock after TV replacement.Improved, off VA ECMO. -UnderlyingRV dysfunction, near baseline per TCTS on intra-op echo -Immune mediated thrombocytopenia worsening, warrants therapy - Abnormal PFTs, air trapping, fixed moderate obstruction; CT not impressive for ILD - Mediastinal hemorrhage and hemothorax post washout - Hypoglycemiaimproved on D10+TF  P - Continue vent support - D10+TF, hopefully wean off former as TF get to goal - Seizure suppression per neurology, MRI today with pausing CRRT, iNO, and EEG to help with prognostication - CRRT with goal neg as tolerated once back from MRI - IVIG x 5 days, trying to avoid steroids to promote wound healing  83minutes critical care time Erskine Emery MD PCCM    NAME:  [name], MRN:  [id number], DOB:  10/20/1970, LOS: 8 ADMISSION DATE:  11/03/2019, CONSULTATION DATE:  10/29/2019  REFERRING MD:  Rexene Alberts, MD, CHIEF COMPLAINT:  Vent management.  History of present illness   The patient is a 49 year old woman who is postoperative day 3 from minimally invasive tricuspid valve repair which was then converted into an open procedure.  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  However despite echocardiograms showing severely elevated  RA pressures, she has had 2 right heart cath that demonstrated normal peripheral vascular resistance for Group 1 pulmonary arterial hypertension.  She was taken to the OR by Dr. Roxy Manns on 4/7 given her persistent symptoms for repair of severe tricuspid regurgitation.  4/8, she had reexploration of her median sternotomy with concern for postoperative bleeding.  There was a large amount of clot evacuated from the anterior mediastinum and significant oozing diffusely.  Does not appear that there were any significant signs for mechanical active bleeding.  Bili at that time she was cannulated for ECMO and started on [place] ECMO on 4/8.  Since then she has had substantial correction of her coagulopathy including Kcentra, FEIBA, cryoprecipitate, FFP and copious blood products given her substantial bleeding in the mediastinum.  She went for decannulation from [facility] ECMO on 4/12.  PCCM is being consulted to assist with vent management.  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   Consults:  Heart failure, PCCM  Procedures:  4/7 tricuspid valve repair through median sternotomy 4/8 reexploration of the mediastinum with cannulation for ECMO 4/12 decannulation from [facility] ECMO  4/14: HD cath right IJ  Significant Diagnostic Tests:  Transesophageal echocardiogram January 2021  1. Left ventricular ejection fraction, by visual estimation, is 60 to  65%. The left ventricle has normal function. There is no left ventricular  hypertrophy.  2. The left ventricle has no regional wall motion abnormalities.  3. Global right ventricle has mildly reduced  systolic  function.The right  ventricular size is normal. No increase in right ventricular wall  thickness.  4. Left atrial size was moderately dilated.  5. Right atrial size was severely dilated.  6. The mitral valve is normal in structure. Trivial mitral valve  regurgitation.  7. The tricuspid valve is abnormal.  8. The tricuspid valve is abnormal. Tricuspid valve regurgitation is  severe.  9. Apparent mild restriction of septal leaflet. RVSP ~ 40mmHG.  10. The aortic valve is normal in structure. Aortic valve regurgitation is  not visualized.  11. The pulmonic valve was grossly normal. Pulmonic valve regurgitation is  not visualized.  12. Mild plaque invoving the descending aorta.  13. Severely elevated pulmonary artery systolic pressure.   Right heart catheterization June 2020 Findings:  RA = 5 RV = 34/7 PA =  34/6 (20) PCW = 8 Fick cardiac output/index = 5.9/3.1 PVR = 2.0 WU Ao sat = 100% PA sat = 71%, 72% SVC sat = 66%  Assessment: 1. Minimally elevated PA pressures with normal PVR and cardiac output 2. No evidence of significant intracardiac shunt  Micro Data:  None  Antimicrobials:  Levofloxacin 4/7 >> 4/8 Vancomycin 4/7 >> 4/12 Meropenem 4/9 >> 4/13  Subjective:  Attempting to wean propofol to obtain MRI.  Started on CRRT.  Weaning milrinone.   Objective   Blood pressure 108/68, pulse (!) 127, temperature 98.4 F (36.9 C), resp. rate (!) 30, height 5\' 7"  (1.702 m), weight 98.5 kg, last menstrual period 04/02/2017, SpO2 100 %. CVP:  [14 mmHg-16 mmHg] 15 mmHg  Vent Mode: PRVC FiO2 (%):  [50 %] 50 % Set Rate:  [26 bmp] 26 bmp Vt Set:  [400 mL] 400 mL PEEP:  [5 cmH20] 5 cmH20 Pressure Support:  [10 cmH20] 10 cmH20 Plateau Pressure:  [23 cmH20-26 cmH20] 25 cmH20   Intake/Output Summary (Last 24 hours) at 10/29/2019 0529 Last data filed at 10/29/2019 0500 Gross per 24 hour  Intake 4369.77 ml  Output 3825 ml  Net 544.77 ml   Filed Weights   10/25/19  0645 11/09/2019 0545 10/27/19 0500  Weight: 92.4 kg 93.5 kg 98.5 kg   Examination:  GEN: Obese female, sedated  HEENT: ETT in place, minimal secretions CV: Tachycardic and irregular rhythm PULM: Diminished bases, passive on vent GI: Soft, hypoactive BS EXT: Global anasarca NEURO: [facility] Problem list   None  Assessment & Plan:  [name] is a 49 y.o. woman with history of SLE with thrombocytopenia, nephritis, AVN and possible mild chronic RV failure who presented for repair of severe symptomatic tricuspid regurgitation on 4/7.    Acute hypoxemic respiratory failure - Continue vent support - Usual VAP prevention measures  Cardiogenic Shock - V/A ecmo 4/8-4/12 - Started on CRRT with fluid removal rate of 300 cc /hr - On milrinone 0.25 mcg/hr and levo 7 mcg/hr  - Nephrology onboard - Diuresis, AC, and inotropes per CHF/TCTS teams  SLE with immune mediated thrombocytopenia and coagulopathy - No other clinical evidence of bleeding.  - May need IVIG - Monitor with AC resumption  Acute encephalopathy Status Epilepticus  - Propofol off. Hopefully obtain MRI today - Antiepileptics per neurology.   Best practice:  Diet: TF with free water Pain/Anxiety/Delirium protocol (if indicated): Propofol  VAP protocol (if indicated): Ordered DVT prophylaxis: heparin gtt GI prophylaxis: Pantoprazole Glucose control: Controlled, blood sugars less than 180 Foley required for critically ill state Mobility: Bedrest Code Status: Full code Family Communication: Per primary team  Disposition: Needs ICU  Please see attending attestation for final recommendations.   Ina Homes, MD  [facility] PGY3  Pager: [phone]

## 2019-10-29 NOTE — Progress Notes (Addendum)
Patient ID: [name], female   DOB: February 15, 1971, 49 y.o.   MRN: [id number] [facility] Progress Note   Assessment/ Plan:   1. Acute kidney Injury on chronic kidney disease stage III: With underlying chronic kidney disease from lupus nephritis and now having suffered acute renal injury possibly hemodynamically mediated based on preceding events/history.  Started on CRRT yesterday after unresponsive to efforts at volume unloading with diuretics; she has been tolerating net -200 cc an hour with one episode of filter clotting. 2.  Multifactorial shock: With hemorrhagic component perioperatively for tricuspid valve repair and thereafter cardiogenic shock.  Improved and now off of [facility] ECMO. Remains on pressors and will continue efforts at UF with CRRT. 3.  Anemia: With coagulopathy and significant perioperative blood loss status post multiple units of PRBC/blood products.  Current hemoglobin and hematocrit within acceptable range. 4.  Seizures: On continuous EEG monitoring and multiple anticonvulsant agents; management per neurology.  Plans noted for MRI today.  Subjective:   With continued episodes of seizures overnight.   Objective:   BP 108/68 Comment: A-line  Comment (BP Location): a-line  Pulse (!) 127   Temp 98.8 F (37.1 C)   Resp 18   Ht _0  (1.702 m)   Wt 95.8 kg   LMP 04/02/2017   SpO2 100%   BMI 33.08 kg/m   Intake/Output Summary (Last 24 hours) at 10/29/2019 0723 Last data filed at 10/29/2019 0700 Gross per 24 hour  Intake 4001.46 ml  Output 4693 ml  Net -691.54 ml   Weight change:   Physical Exam: Gen: Intubated, sedated, with ongoing EEG monitoring CVS: Irregular tachycardia, S1 and S2 normal.  Chest drain in situ. Resp: Distant breath sounds bilaterally without distinct rales or rhonchi Abd: Soft, obese, nontender Ext: 2-3+ edema over upper extremities, 1-2+ edema lower extremities Imaging: DG CHEST PORT 1 VIEW  Result Date: 10/28/2019 CLINICAL  DATA:  49 year old female with history of tricuspid valve repair. Central line placement. EXAM: PORTABLE CHEST 1 VIEW COMPARISON:  Chest x-ray 10/28/2019. FINDINGS: An endotracheal tube is in place with tip 5.4 cm above the carina. Previously noted bilateral chest tubes and mediastinal/pericardial drains appear similarly positioned. Epicardial pacing wires again noted. A nasogastric tube is seen extending into the stomach, however, the tip of the nasogastric tube extends below the lower margin of the image. Very poor aeration throughout the right lung compatible with areas of atelectasis and/or consolidation as well as probable moderate right pleural effusion. No left pleural effusion. No evidence of pulmonary edema. Heart size is mildly enlarged. Status post tricuspid annuloplasty. IMPRESSION: 1. Postoperative changes and support apparatus, as above. Radiographic appearance the chest is otherwise essentially unchanged. Electronically Signed   By: Vinnie Langton M.D.   On: 10/28/2019 10:20   DG CHEST PORT 1 VIEW  Result Date: 10/28/2019 CLINICAL DATA:  49 year old female status post open heart surgery. EXAM: PORTABLE CHEST 1 VIEW COMPARISON:  Chest x-ray 10/27/2019. FINDINGS: Three right-sided chest tubes, 1 left-sided chest tube and 2 midline drains (presumably mediastinal/pericardial) are again noted, stable in position very poor aeration in the right lung again noted. There is a left upper extremity PICC with tip terminating in the superior cavoatrial junction. An endotracheal tube is in place with tip 4.6 cm above the carina. Left lung appears low volume, but is generally clear, with exception of some subsegmental atelectasis at the left base. No pleural effusion. No appreciable pneumothorax. Heart size is mildly enlarged. Mediastinal contours are distorted by patient  positioning. Postoperative changes of tricuspid annuloplasty. Epicardial pacing wires are noted. IMPRESSION: 1. Unchanged radiographic  appearance of the chest, as detailed above. Persistent opacity throughout the right hemithorax may reflect areas of atelectasis and/or consolidation in the lung with superimposed right pleural effusion. Electronically Signed   By: Vinnie Langton M.D.   On: 10/28/2019 08:21   DG CHEST PORT 1 VIEW  Result Date: 10/27/2019 CLINICAL DATA:  PICC line placement. EXAM: PORTABLE CHEST 1 VIEW COMPARISON:  Chest x-ray from same day at 0510 hours. FINDINGS: New left upper extremity PICC line with tip near the cavoatrial junction. Unchanged endotracheal and enteric tubes. Unchanged left internal jugular central venous catheter. Unchanged bilateral chest tubes and mediastinal drains. Stable cardiomediastinal silhouette status post tricuspid valve replacement. Unchanged airspace disease throughout the majority of the right lung. Unchanged left basilar atelectasis and small right greater than left pleural effusions. Pleural thickening along the medial right lung apex again noted. No pneumothorax. No acute osseous abnormality. Chronic avascular necrosis of both humeral heads again noted. IMPRESSION: 1. New left upper extremity PICC line with tip near the cavoatrial junction. 2. Otherwise stable chest. Electronically Signed   By: Titus Dubin M.D.   On: 10/27/2019 11:31    Labs: [facility] Recent Labs  Lab 10/24/19 0356 10/24/19 0407 10/18/2019 0406 10/18/2019 8768 10/15/2019 1833 11/01/2019 1834 10/27/19 1157 10/27/19 0421 10/27/19 1651 10/28/19 2620 10/28/19 0355 10/28/19 1640 10/28/19 1841 10/29/19 0323 10/29/19 0346  NA 150*   < > 143   < > 142   < > 141   < > 137 138 138 138 137 136 136  K 4.0   < > 4.1   < > 4.1   < > 4.5   < > 4.1 4.3 4.1 4.4 4.1 4.4 4.3  CL 115*   < > 104  --  104  --  103  --  100 100  --  101  --  101  --   CO2 25   < > 28  --  26  --  26  --  22 23  --  24  --  23  --   GLUCOSE 86   < > 235*  --  104*  --  72  --  94 120*  --  160*  --  138*  --   BUN 31*   < > 40*  --  42*  --  44*   --  48* 51*  --  47*  --  42*  --   CREATININE 2.49*   < > 2.48*  --  2.41*  --  2.80*  --  3.11* 3.12*  --  2.76*  --  2.39*  --   CALCIUM 7.7*   < > 8.5*  --  8.2*  --  8.5*  --  8.5* 8.6*  --  8.3*  --  8.2*  --   PHOS 6.6*  --   --   --   --   --   --   --   --  8.4*  --  7.4*  --  6.5*  --    < > = values in this interval not displayed.   CBC Recent Labs  Lab 10/27/19 0412 10/27/19 0421 10/27/19 1651 10/27/19 1651 10/28/19 0312 10/28/19 0312 10/28/19 0355 10/28/19 1841 10/29/19 0323 10/29/19 0346  WBC 6.7  --  7.2  --  9.0  --   --   --  9.5  --  HGB 11.1*   < > 11.8*   < > 11.9*   < > 11.6* 11.2* 11.6* 11.9*  HCT 33.2*   < > 34.8*   < > 35.5*   < > 34.0* 33.0* 34.7* 35.0*  MCV 90.2  --  87.7  --  87.4  --   --   --  87.8  --   PLT 83*  --  66*  --  63*  --   --   --  37*  --    < > = values in this interval not displayed.    Medications:    . B-complex with vitamin C  1 tablet Per Tube Daily  . bisacodyl  10 mg Oral Daily   Or  . bisacodyl  10 mg Rectal Daily  . chlorhexidine gluconate (MEDLINE KIT)  15 mL Mouth Rinse BID  . Chlorhexidine Gluconate Cloth  6 each Topical Daily  . feeding supplement (PIVOT 1.5 CAL)  1,000 mL Per Tube Q24H  . insulin aspart  0-24 Units Subcutaneous Q4H  . levothyroxine  50 mcg Per Tube Q0600  . mouth rinse  15 mL Mouth Rinse 10 times per day  . metolazone  5 mg Per Tube Daily  . pantoprazole (PROTONIX) IV  40 mg Intravenous QHS  . PHENObarbital  130 mg Intravenous BID  . sodium chloride flush  10-40 mL Intracatheter Q12H  . sodium chloride flush  10-40 mL Intracatheter Q12H   Elmarie Shiley, MD 10/29/2019, 7:23 AM

## 2019-10-30 ENCOUNTER — Inpatient Hospital Stay (HOSPITAL_COMMUNITY): Payer: BC Managed Care – PPO

## 2019-10-30 DIAGNOSIS — R569 Unspecified convulsions: Secondary | ICD-10-CM | POA: Diagnosis not present

## 2019-10-30 DIAGNOSIS — R570 Cardiogenic shock: Secondary | ICD-10-CM | POA: Diagnosis not present

## 2019-10-30 DIAGNOSIS — G40901 Epilepsy, unspecified, not intractable, with status epilepticus: Secondary | ICD-10-CM | POA: Diagnosis not present

## 2019-10-30 DIAGNOSIS — R092 Respiratory arrest: Secondary | ICD-10-CM | POA: Diagnosis not present

## 2019-10-30 DIAGNOSIS — J9601 Acute respiratory failure with hypoxia: Secondary | ICD-10-CM | POA: Diagnosis not present

## 2019-10-30 LAB — PROTIME-INR
INR: 1.1 (ref 0.8–1.2)
Prothrombin Time: 14 seconds (ref 11.4–15.2)

## 2019-10-30 LAB — RENAL FUNCTION PANEL
Albumin: 2.5 g/dL — ABNORMAL LOW (ref 3.5–5.0)
Albumin: 2.3 g/dL — ABNORMAL LOW (ref 3.5–5.0)
Anion gap: 12 (ref 5–15)
Anion gap: 10 (ref 5–15)
BUN: 39 mg/dL — ABNORMAL HIGH (ref 6–20)
BUN: 40 mg/dL — ABNORMAL HIGH (ref 6–20)
CO2: 22 mmol/L (ref 22–32)
CO2: 23 mmol/L (ref 22–32)
Calcium: 8.2 mg/dL — ABNORMAL LOW (ref 8.9–10.3)
Calcium: 7.9 mg/dL — ABNORMAL LOW (ref 8.9–10.3)
Chloride: 101 mmol/L (ref 98–111)
Chloride: 100 mmol/L (ref 98–111)
Creatinine, Ser: 1.92 mg/dL — ABNORMAL HIGH (ref 0.44–1.00)
Creatinine, Ser: 1.75 mg/dL — ABNORMAL HIGH (ref 0.44–1.00)
GFR calc Af Amer: 36 mL/min — ABNORMAL LOW (ref >60)
GFR calc Af Amer: 40 mL/min — ABNORMAL LOW (ref >60)
GFR calc non Af Amer: 31 mL/min — ABNORMAL LOW (ref >60)
GFR calc non Af Amer: 34 mL/min — ABNORMAL LOW (ref >60)
Glucose, Bld: 145 mg/dL — ABNORMAL HIGH (ref 70–99)
Glucose, Bld: 160 mg/dL — ABNORMAL HIGH (ref 70–99)
Phosphorus: 3.7 mg/dL (ref 2.5–4.6)
Phosphorus: 2.7 mg/dL (ref 2.5–4.6)
Potassium: 4.2 mmol/L (ref 3.5–5.1)
Potassium: 3.9 mmol/L (ref 3.5–5.1)
Sodium: 135 mmol/L (ref 135–145)
Sodium: 133 mmol/L — ABNORMAL LOW (ref 135–145)

## 2019-10-30 LAB — HEPATIC FUNCTION PANEL
ALT: 22 U/L (ref 0–44)
AST: 37 U/L (ref 15–41)
Albumin: 2.5 g/dL — ABNORMAL LOW (ref 3.5–5.0)
Alkaline Phosphatase: 122 U/L (ref 38–126)
Bilirubin, Direct: 0.4 mg/dL — ABNORMAL HIGH (ref 0.0–0.2)
Indirect Bilirubin: 1 mg/dL — ABNORMAL HIGH (ref 0.3–0.9)
Total Bilirubin: 1.4 mg/dL — ABNORMAL HIGH (ref 0.3–1.2)
Total Protein: 5.8 g/dL — ABNORMAL LOW (ref 6.5–8.1)

## 2019-10-30 LAB — GLUCOSE, CAPILLARY
Glucose-Capillary: 117 mg/dL — ABNORMAL HIGH (ref 70–99)
Glucose-Capillary: 119 mg/dL — ABNORMAL HIGH (ref 70–99)
Glucose-Capillary: 132 mg/dL — ABNORMAL HIGH (ref 70–99)
Glucose-Capillary: 144 mg/dL — ABNORMAL HIGH (ref 70–99)
Glucose-Capillary: 162 mg/dL — ABNORMAL HIGH (ref 70–99)
Glucose-Capillary: 91 mg/dL (ref 70–99)

## 2019-10-30 LAB — POCT I-STAT 7, (LYTES, BLD GAS, ICA,H+H)
Acid-Base Excess: 2 mmol/L (ref 0.0–2.0)
Acid-Base Excess: 2 mmol/L (ref 0.0–2.0)
Bicarbonate: 24.3 mmol/L (ref 20.0–28.0)
Bicarbonate: 25.6 mmol/L (ref 20.0–28.0)
Bicarbonate: 25.3 mmol/L (ref 20.0–28.0)
Calcium, Ion: 1.09 mmol/L — ABNORMAL LOW (ref 1.15–1.40)
Calcium, Ion: 1.1 mmol/L — ABNORMAL LOW (ref 1.15–1.40)
Calcium, Ion: 1.11 mmol/L — ABNORMAL LOW (ref 1.15–1.40)
HCT: 32 % — ABNORMAL LOW (ref 36.0–46.0)
HCT: 33 % — ABNORMAL LOW (ref 36.0–46.0)
HCT: 31 % — ABNORMAL LOW (ref 36.0–46.0)
Hemoglobin: 10.9 g/dL — ABNORMAL LOW (ref 12.0–15.0)
Hemoglobin: 11.2 g/dL — ABNORMAL LOW (ref 12.0–15.0)
Hemoglobin: 10.5 g/dL — ABNORMAL LOW (ref 12.0–15.0)
O2 Saturation: 99 %
O2 Saturation: 99 %
O2 Saturation: 99 %
Patient temperature: 36.6
Patient temperature: 37.4
Patient temperature: 97.7
Potassium: 3.9 mmol/L (ref 3.5–5.1)
Potassium: 3.8 mmol/L (ref 3.5–5.1)
Potassium: 3.8 mmol/L (ref 3.5–5.1)
Sodium: 137 mmol/L (ref 135–145)
Sodium: 137 mmol/L (ref 135–145)
Sodium: 138 mmol/L (ref 135–145)
TCO2: 25 mmol/L (ref 22–32)
TCO2: 27 mmol/L (ref 22–32)
TCO2: 26 mmol/L (ref 22–32)
pCO2 arterial: 35.2 mmHg (ref 32.0–48.0)
pCO2 arterial: 36.3 mmHg (ref 32.0–48.0)
pCO2 arterial: 33.1 mmHg (ref 32.0–48.0)
pH, Arterial: 7.446 (ref 7.350–7.450)
pH, Arterial: 7.458 — ABNORMAL HIGH (ref 7.350–7.450)
pH, Arterial: 7.491 — ABNORMAL HIGH (ref 7.350–7.450)
pO2, Arterial: 121 mmHg — ABNORMAL HIGH (ref 83.0–108.0)
pO2, Arterial: 118 mmHg — ABNORMAL HIGH (ref 83.0–108.0)
pO2, Arterial: 109 mmHg — ABNORMAL HIGH (ref 83.0–108.0)

## 2019-10-30 LAB — COOXEMETRY PANEL
Carboxyhemoglobin: 1.5 % (ref 0.5–1.5)
Methemoglobin: 1.3 % (ref 0.0–1.5)
O2 Saturation: 64.4 %
Total hemoglobin: 10.3 g/dL — ABNORMAL LOW (ref 12.0–16.0)

## 2019-10-30 LAB — PHENOBARBITAL LEVEL: Phenobarbital: 10.1 ug/mL — ABNORMAL LOW (ref 15.0–30.0)

## 2019-10-30 LAB — CBC
HCT: 32.1 % — ABNORMAL LOW (ref 36.0–46.0)
Hemoglobin: 11 g/dL — ABNORMAL LOW (ref 12.0–15.0)
MCH: 29.6 pg (ref 26.0–34.0)
MCHC: 34.3 g/dL (ref 30.0–36.0)
MCV: 86.5 fL (ref 80.0–100.0)
Platelets: 30 10*3/uL — ABNORMAL LOW (ref 150–400)
RBC: 3.71 MIL/uL — ABNORMAL LOW (ref 3.87–5.11)
RDW: 16.2 % — ABNORMAL HIGH (ref 11.5–15.5)
WBC: 9.9 10*3/uL (ref 4.0–10.5)
nRBC: 0 % (ref 0.0–0.2)

## 2019-10-30 LAB — HEPARIN INDUCED PLATELET AB (HIT ANTIBODY): Heparin Induced Plt Ab: 0.103 OD (ref 0.000–0.400)

## 2019-10-30 LAB — AMMONIA: Ammonia: 27 umol/L (ref 9–35)

## 2019-10-30 LAB — MAGNESIUM: Magnesium: 2.4 mg/dL (ref 1.7–2.4)

## 2019-10-30 MED ORDER — PHENOBARBITAL SODIUM 130 MG/ML IJ SOLN
160.0000 mg | Freq: Two times a day (BID) | INTRAMUSCULAR | Status: DC
  Administered 2019-10-30 – 2019-11-10 (×22): 160 mg via INTRAVENOUS
  Filled 2019-10-30 (×22): qty 2

## 2019-10-30 NOTE — Progress Notes (Signed)
LTM EEG discontinued -  skin breakdown at unhook T7.F8 nurse notified.

## 2019-10-30 NOTE — Procedures (Addendum)
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 EEGto evaluate for seizure  Level of alertness:comatose  AEDs during EEG study:keppra, vimpat,phenobarb  Technical aspects: This EEG study was done with scalp electrodes positioned according to the 10-20 International system of electrode placement. Electrical activity was acquired at a sampling rate of 500Hz  and reviewed with a high frequency filter of 70Hz  and a low frequency filter of 1Hz . EEG data were recorded continuously and digitally stored.  DESCRIPTION:EEG showed continuous low amplitude 2-3Hz  delta slowing as well as frequent sharp waves in left frontocentral region, at times periodic at 0.5-1hz .Hyperventilation and photic stimulation were not performed.  ABNORMALITY - Sharp waves, Left frontocentral region - Continuous slow, generalized  IMPRESSION: This studyshowed evidenceof epilepsyarising from left frontocentral region likely secondary to underlying structural abnormality.Additionally, EEG showedsevere diffuse encephalopathy, non specific to etiologybut most likely secondary to sedation.No definite seizures were seen.  [name] Barbra Sarks

## 2019-10-30 NOTE — Progress Notes (Addendum)
[address]       [place] [address]             [phone]        CARDIOTHORACIC SURGERY PROGRESS NOTE   R4 Days Post-Op Procedure(s) (LRB): MEDIASTERNAL WASH OUT,  ECMO DECANNULATION AND STERNAL CLOSURE (N/A) TRANSESOPHAGEAL ECHOCARDIOGRAM (TEE) (N/A)  Subjective: Comatose on vent.  No reported seizure activity.  + cough but no gag reflex.  Pupils sluggish, equal  Objective: Vital signs: BP Readings from Last 1 Encounters:  10/29/19 121/71   Pulse Readings from Last 1 Encounters:  10/30/19 (!) 119   Resp Readings from Last 1 Encounters:  10/30/19 (!) 38   Temp Readings from Last 1 Encounters:  10/30/19 99 F (37.2 C) ((P) Bladder)    Hemodynamics: CVP:  [6 mmHg-11 mmHg] 8 mmHg  Mixed venous co-ox 64.4%   Physical Exam:  Rhythm:   Aflutter 110  Breath sounds: coarse  Heart sounds:  tachy  Incisions:  Clean and dry  Abdomen:  Soft, non-distended, + BM, tolerating tube feeds  Extremities:  Warm, well-perfused  Chest tubes:  Very low volume thin serosanguinous output, no air leak    Intake/Output from previous day: 04/15 0701 - 04/16 0700 In: 2771.9 [I.V.:1400; NG/GT:1101.8; IV Piggyback:270.1] Out: 6127 [Urine:185; Chest Tube:200] Intake/Output this shift: Total I/O In: 116.8 [I.V.:56.8; NG/GT:60] Out: 222 [Urine:15; Other:207]  Lab Results:  CBC: Recent Labs    10/29/19 0323 10/29/19 0346 10/30/19 0427 10/30/19 0627  WBC 9.5  --  9.9  --   HGB 11.6*   < > 11.0* 11.2*  HCT 34.7*   < > 32.1* 33.0*  PLT 37*  --  30*  --    < > = values in this interval not displayed.    BMET:  Recent Labs    10/29/19 1652 10/29/19 1859 10/30/19 0427 10/30/19 0627  NA 137   < > 135 137  K 3.9   < > 4.2 3.8  CL 103  --  101  --   CO2 24  --  22  --   GLUCOSE 125*  --  145*  --   BUN 43*  --  39*  --   CREATININE 2.21*  --  1.92*  --   CALCIUM 7.7*  --  8.2*  --    < > = values in this interval not displayed.     PT/INR:     Recent Labs    10/30/19 0427  LABPROT 14.0  INR 1.1    CBG (last 3)  Recent Labs    10/29/19 2032 10/29/19 2331 10/30/19 0427  GLUCAP 139* 111* 117*    ABG    Component Value Date/Time   PHART 7.458 (H) 10/30/2019 0627   PCO2ART 36.3 10/30/2019 0627   PO2ART 118.0 (H) 10/30/2019 0627   HCO3 25.6 10/30/2019 0627   TCO2 27 10/30/2019 0627   ACIDBASEDEF 1.0 10/28/2019 1841   O2SAT 99.0 10/30/2019 0627    CXR: PORTABLE CHEST 1 VIEW  COMPARISON:  10/28/2019 chest radiographs.  FINDINGS: Interval retraction of endotracheal tube terminating approximately 7.7 cm above the carina. Interval removal of epicardial pacing leads and left IJ approach CVC. Remaining support devices are unchanged.  Right upper lung opacities and right basilar consolidation, unchanged. Left basilar atelectasis, unchanged. Moderate right and small left pleural effusions, unchanged.  Partially obscured cardiomediastinal silhouette. Sequela of tricuspid annuloplasty.  IMPRESSION: Interval retraction of ETT terminating 7.7 cm above the  carina, previously 5.4 cm.  Interval removal of epicardial pacing leads.  Otherwise stable postsurgical appearance of the chest.   Electronically Signed   By: Primitivo Gauze M.D.   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  We discussed progress over the past week as well as long-term prognosis.  We discussed my opinion that that Mrs. Gasner has suffered a significant brain injury presumably related to anoxia at the time of her cardiac arrest.  We discussed long-term expectations and possible outcomes.  We discussed CODE STATUS.  All questions answered.  We plan to continue current treatment plan without any changes at this time.    Rexene Alberts, MD 10/30/2019 11:27 AM

## 2019-10-30 NOTE — Procedures (Signed)
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; s/p minimmally invasive TV repair, hemorrhagic / cardiogenic shock postoperative, status epiletpicus, TCP on IVIG  Current CRRT Prescription: Catheter: R IJ  BFR: 200 Pre Blood Pump: 500 4K DFR: 1500 4K Replacement Rate: 300 4K Goal UF: 252m / h net neg Anticoagulation: None Clotting: about 1x/24h  S:  On milrinone, off pressors  K3.8, phosphorus 3.7  O: 04/15 0701 - 04/16 0700 In: 2771.9 [I.V.:1400; NG/GT:1101.8; IV Piggyback:270.1] Out: 6127 [Urine:185; Chest Tube:200]  Filed Weights   10/27/19 0500 10/29/19 0500 10/30/19 0500  Weight: 98.5 kg 95.8 kg 92.6 kg    Recent Labs  Lab 10/29/19 0323 10/29/19 0346 10/29/19 1652 10/29/19 1652 10/29/19 1859 10/30/19 0427 10/30/19 0627  NA 136   < > 137   < > 137 135 137  K 4.4   < > 3.9   < > 3.9 4.2 3.8  CL 101  --  103  --   --  101  --   CO2 23  --  24  --   --  22  --   GLUCOSE 138*  --  125*  --   --  145*  --   BUN 42*  --  43*  --   --  39*  --   CREATININE 2.39*  --  2.21*  --   --  1.92*  --   CALCIUM 8.2*  --  7.7*  --   --  8.2*  --   PHOS 6.5*  --  5.5*  --   --  3.7  --    < > = values in this interval not displayed.   Recent Labs  Lab 10/28/19 0312 10/28/19 0355 10/29/19 0323 10/29/19 0346 10/29/19 1859 10/30/19 0427 10/30/19 0627  WBC 9.0  --  9.5  --   --  9.9  --   HGB 11.9*   < > 11.6*   < > 10.9* 11.0* 11.2*  HCT 35.5*   < > 34.7*   < > 32.0* 32.1* 33.0*  MCV 87.4  --  87.8  --   --  86.5  --   PLT 63*  --  37*  --   --  30*  --    < > = values in this interval not displayed.    Scheduled Meds: . B-complex with vitamin C  1 tablet Per Tube Daily  . bisacodyl  10 mg Oral Daily   Or  . bisacodyl  10 mg Rectal Daily  . chlorhexidine gluconate (MEDLINE KIT)  15 mL Mouth Rinse BID  . Chlorhexidine Gluconate Cloth  6 each Topical Daily  . feeding supplement (PIVOT 1.5 CAL)  1,000 mL Per Tube Q24H   . insulin aspart  0-24 Units Subcutaneous Q4H  . levothyroxine  50 mcg Per Tube Q0600  . mouth rinse  15 mL Mouth Rinse 10 times per day  . metolazone  5 mg Per Tube Daily  . pantoprazole (PROTONIX) IV  40 mg Intravenous QHS  . PHENObarbital  130 mg Intravenous BID  . sodium chloride flush  10-40 mL Intracatheter Q12H   Continuous Infusions: .  prismasol BGK 4/2.5 500 mL/hr at 10/30/19 0213  .  prismasol BGK 4/2.5 300 mL/hr at 10/29/19 0913  . sodium chloride Stopped (10/27/19 1308)  . sodium chloride 10 mL/hr at 10/27/19 1722  . amiodarone 60 mg/hr (10/30/19 0800)  . dextrose 20 mL/hr at 10/30/19 0800  .  furosemide (LASIX) infusion Stopped (10/28/19 0824)  . Immune Globulin 10% 40 g (10/29/19 0954)  . lacosamide (VIMPAT) IV Stopped (10/29/19 2252)  . levETIRAcetam Stopped (10/29/19 2358)  . midazolam (VERSED) infusion Stopped (10/27/19 1548)  . milrinone 0.125 mcg/kg/min (10/30/19 0800)  . norepinephrine (LEVOPHED) Adult infusion Stopped (10/29/19 1902)  . prismasol BGK 4/2.5 1,500 mL/hr at 10/30/19 0644  . propofol (DIPRIVAN) infusion Stopped (10/29/19 0415)   PRN Meds:.sodium chloride, sodium chloride, acetaminophen (TYLENOL) oral liquid 160 mg/5 mL, artificial tears, dextrose, dextrose, diphenhydrAMINE, heparin, heparin, metoprolol tartrate, midazolam, morphine injection, ondansetron (ZOFRAN) IV, sodium chloride flush  ABG    Component Value Date/Time   PHART 7.458 (H) 10/30/2019 0627   PCO2ART 36.3 10/30/2019 0627   PO2ART 118.0 (H) 10/30/2019 0627   HCO3 25.6 10/30/2019 0627   TCO2 27 10/30/2019 0627   ACIDBASEDEF 1.0 10/28/2019 1841   O2SAT 99.0 10/30/2019 0627    A/P  1. 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 Shock, cardiogenic and hemorrhagic, on inotrope; significant RV failure 3. VDRF. Inhaled NO2 per CCM 4. Status post minimally invasive tricuspid valve repair 10/16/2019 5. ABLA  6. 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 SLE 8. ITP, on IVIG  Cont CRRT current settings.  Stop metolazone given minimal UOP.  No anticoagulation in CRRT system, clotting frequency acceptable.   Rexene Agent , MD [facility]

## 2019-10-30 NOTE — Progress Notes (Signed)
Subjective: No seizures overnight.   ROS: Unable to obtain due to poor mental status  Examination  Vital signs in last 24 hours: Temp:  [96.8 F (36 C)-99.9 F (37.7 C)] 96.8 F (36 C) (04/16 1200) Pulse Rate:  [111-126] 111 (04/16 1140) Resp:  [0-38] 34 (04/16 1200) BP: (118-121)/(68-76) 118/68 (04/16 1140) SpO2:  [91 %-100 %] 100 % (04/16 1200) Arterial Line BP: (89-134)/(59-84) 123/71 (04/16 1200) FiO2 (%):  [50 %] 50 % (04/16 1140) Weight:  [92.6 kg] 92.6 kg (04/16 0500)  General: lying in bed,not in apparent distress CVS: pulse-normal rate,irregular rhythm RS: breathing comfortably,intubated Extremities:Edematous  Neuro: GY:JEHUDJSH, does not open eyes to noxious stimuli CN: pupils equal,round and reactive to light, corneal reflex present, cough reflex present Motor:Does not withdraw to noxious stimuliin all 4 extremities,flaccid  Basic Metabolic Panel: Recent Labs  Lab 11/05/2019 1833 10/24/2019 1834 10/27/19 0412 10/27/19 0421 10/27/19 1651 10/27/19 1651 10/28/19 0312 10/28/19 0355 10/28/19 1640 10/28/19 1841 10/29/19 0323 10/29/19 0323 10/29/19 0346 10/29/19 1652 10/29/19 1859 10/30/19 0427 10/30/19 0627  NA 142   < > 141   < > 137   < > 138   < > 138   < > 136   < > 136 137 137 135 137  K 4.1   < > 4.5   < > 4.1   < > 4.3   < > 4.4   < > 4.4   < > 4.3 3.9 3.9 4.2 3.8  CL 104   < > 103   < > 100   < > 100  --  101  --  101  --   --  103  --  101  --   CO2 26   < > 26   < > 22   < > 23  --  24  --  23  --   --  24  --  22  --   GLUCOSE 104*   < > 72   < > 94   < > 120*  --  160*  --  138*  --   --  125*  --  145*  --   BUN 42*   < > 44*   < > 48*   < > 51*  --  47*  --  42*  --   --  43*  --  39*  --   CREATININE 2.41*   < > 2.80*   < > 3.11*   < > 3.12*  --  2.76*  --  2.39*  --   --  2.21*  --  1.92*  --   CALCIUM 8.2*   < > 8.5*   < > 8.5*   < > 8.6*   < > 8.3*   < > 8.2*  --   --  7.7*  --  8.2*  --   MG 2.4  --  2.3  --  2.4  --   --   --    --   --  2.4  --   --   --   --  2.4  --   PHOS  --   --   --   --   --   --  8.4*  --  7.4*  --  6.5*  --   --  5.5*  --  3.7  --    < > = values in this interval not displayed.    CBC: Recent Labs  Lab 10/27/19 0412 10/27/19 0421 10/27/19 1651 10/27/19 1651 10/28/19 0312 10/28/19 0355 10/29/19 0323 10/29/19 0346 10/29/19 1859 10/30/19 0427 10/30/19 0627  WBC 6.7  --  7.2  --  9.0  --  9.5  --   --  9.9  --   HGB 11.1*   < > 11.8*   < > 11.9*   < > 11.6* 11.9* 10.9* 11.0* 11.2*  HCT 33.2*   < > 34.8*   < > 35.5*   < > 34.7* 35.0* 32.0* 32.1* 33.0*  MCV 90.2  --  87.7  --  87.4  --  87.8  --   --  86.5  --   PLT 83*  --  66*  --  63*  --  37*  --   --  30*  --    < > = values in this interval not displayed.     Coagulation Studies: Recent Labs    10/28/19 0312 10/29/19 0323 10/30/19 0427  LABPROT 14.5 13.8 14.0  INR 1.1 1.1 1.1    Imaging  CT head without contrast 10/29/2019: Apparent asymmetric hypodensity within the right cerebellar hemisphere may reflect asymmetric beam hardening artifact. Edema related to infarct or hypoxic/ischemic injury cannot be excluded.    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  No definite seizures overnight however EEG showing frequent left and central spikes.  Recommendations - Continue Keppra 1000mg  BID,Vimpat 100mg  BID and phenobarbital 130 mg twice daily, phenobarb level ordered and pending -Patient  continues to be minimally responsive (only has cranial nerve reflexes) after being off sedation for 48 hours.  She also had status epilepticus and has multiple other comorbidities.  Therefore, there is concern that patient has suffered significant neurologic damage and will likely have a poor neurologic recovery.  This was discussed with husband briefly on phone today by me.  - We will continue neuro exams for neuro prognostication.  Discussed with nurse to keep patient off of sedation as much as possible. - continue seizure precautions - PRN IV versed 5mg  for clinical seizure like activity -Management of rest of the comorbidities per primary team  CRITICAL CARE Performed by: Lora Havens  Total critical care time:24minutes  Critical care time was exclusive of separately billable procedures and treating other patients.  Critical care was necessary to treat or prevent imminent or life-threatening deterioration.  Critical care was time spent personally by me on the following activities: development of treatment plan with patient and/or surrogate as well as nursing, discussions with consultants, evaluation of patient's response to treatment, examination of patient, obtaining history from patient or surrogate, ordering and performing treatments and interventions, ordering and review of laboratory studies, ordering and review of radiographic studies, pulse oximetry and re-evaluation of patient's condition.   Zeb Comfort Epilepsy [facility] For questions after 5pm please refer to AMION to reach the Neurologist on call

## 2019-10-30 NOTE — Progress Notes (Addendum)
Seen and examined in f/u for multifactorial shock after valve replacement. 10/29/19  No events overnight.  On CRRT.  Remains off sedation.  Pacer wire had partial fracture so cannot get MRI.  Obtunded on vent More tachypneic today Diffuse anasarca Ext warm Lungs diminished on R   ABGbenign Plat 20 (4/16), PEEP 5, DP 15 Remains on iNO Hgb stable Plts 107>>83>>66>>63>>37>>30 CBGs 74-140 4/11 cortisol 7.6 CT shows a posterior cerebellar infarct, diffuse loss of sulci posteriorly but read as artifact  A: - Multifactorial (hemorrhagic, cardiac) shock after TV replacement.Improved, off [place] ECMO. - Postoperative respiratory failure related to blood loss, fluid overload and now encephalopathy. 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  P - Continue vent support - Wean off D10 today - Seizure suppression per neurology, await sedative to wear off to see where we stand neurologically -CRRT with goal neg as tolerated - IVIG x 5 days, trying to avoid steroids to promote wound healing but if drops tomorrow will start them - Check ammonia  93minutes critical care time Erskine Emery MD PCCM    NAME:  [name], MRN:  [id number], DOB:  September 10, 1970, LOS: 9 ADMISSION DATE:  10/16/2019, CONSULTATION DATE:  10/30/2019  REFERRING MD:  Rexene Alberts, MD, CHIEF COMPLAINT:  Vent management.  History of present illness   The patient is a 49 year old woman who is postoperative day 3 from minimally invasive tricuspid valve repair which was then converted into an open procedure.  She has a history of systemic lupus erythematosus complicated by avascular necrosis, lupus  nephritis, immune mediated thrombocytopenia, and severe tricuspid regurgitation.  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  However despite echocardiograms showing severely elevated RA pressures, she has had 2 right heart cath that demonstrated normal peripheral vascular resistance for Group 1 pulmonary arterial hypertension.  She was taken to the OR by Dr. Roxy Manns on 4/7 given her persistent symptoms for repair of severe tricuspid regurgitation.  4/8, she had reexploration of her median sternotomy with concern for postoperative bleeding.  There was a large amount of clot evacuated from the anterior mediastinum and significant oozing diffusely.  Does not appear that there were any significant signs for mechanical active bleeding.  Bili at that time she was cannulated for ECMO and started on [place] ECMO on 4/8.  Since then she has had substantial correction of her coagulopathy including Kcentra, FEIBA, cryoprecipitate, FFP and copious blood products given her substantial bleeding in the mediastinum.  She went for decannulation from [facility] ECMO on 4/12.  PCCM is being consulted to assist with vent management.  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   Consults:  Heart failure, PCCM  Procedures:  4/7 tricuspid valve repair through median sternotomy 4/8 reexploration of the mediastinum with cannulation for ECMO 4/12 decannulation from [facility] ECMO  4/14: HD cath right IJ  Significant Diagnostic Tests:  Transesophageal echocardiogram January 2021  1. Left ventricular ejection fraction, by  visual  estimation, is 60 to  65%. The left ventricle has normal function. There is no left ventricular  hypertrophy.  2. The left ventricle has no regional wall motion abnormalities.  3. Global right ventricle has mildly reduced systolic function.The right  ventricular size is normal. No increase in right ventricular wall  thickness.  4. Left atrial size was moderately dilated.  5. Right atrial size was severely dilated.  6. The mitral valve is normal in structure. Trivial mitral valve  regurgitation.  7. The tricuspid valve is abnormal.  8. The tricuspid valve is abnormal. Tricuspid valve regurgitation is  severe.  9. Apparent mild restriction of septal leaflet. RVSP ~ 96mmHG.  10. The aortic valve is normal in structure. Aortic valve regurgitation is  not visualized.  11. The pulmonic valve was grossly normal. Pulmonic valve regurgitation is  not visualized.  12. Mild plaque invoving the descending aorta.  13. Severely elevated pulmonary artery systolic pressure.   Right heart catheterization June 2020 Findings:  RA = 5 RV = 34/7 PA =  34/6 (20) PCW = 8 Fick cardiac output/index = 5.9/3.1 PVR = 2.0 WU Ao sat = 100% PA sat = 71%, 72% SVC sat = 66%  Assessment: 1. Minimally elevated PA pressures with normal PVR and cardiac output 2. No evidence of significant intracardiac shunt  CT Head 4/15: Evaluation limited by streak and beam hardening artifact arising from scalp monitoring leads.  Apparent asymmetric hypodensity within the right cerebellar hemisphere may reflect asymmetric beam hardening artifact. Edema related to infarct or hypoxic/ischemic injury cannot be excluded. Consider brain MRI for further evaluation, as clinically warranted.  Paranasal sinus mucosal thickening.  Large bilateral mastoid effusions. Associated left middle ear effusion.  Micro Data:  None  Antimicrobials:  Levofloxacin 4/7 >> 4/8 Vancomycin 4/7 >> 4/12 Meropenem 4/9  >> 4/13  Subjective:  Off all sedation.  Levophed off.  Milrinone down to 0.136mcg.  CT head with asymmetric hypodensity within the right cerebellar hemisphere cannot exclude anoxic brain injury.  Net negative 3.1L over the last 24 hours.  Objective   Blood pressure 121/71, pulse (!) 119, temperature 99.5 F (37.5 C), resp. rate 19, height 5\' 7"  (1.702 m), weight 95.8 kg, last menstrual period 04/02/2017, SpO2 92 %. CVP:  [6 mmHg-17 mmHg] 8 mmHg  Vent Mode: PRVC FiO2 (%):  [50 %] 50 % Set Rate:  [26 bmp] 26 bmp Vt Set:  [400 mL] 400 mL PEEP:  [5 cmH20] 5 cmH20 Plateau Pressure:  [21 cmH20-28 cmH20] 21 cmH20   Intake/Output Summary (Last 24 hours) at 10/30/2019 3474 Last data filed at 10/30/2019 0600 Gross per 24 hour  Intake 2759.95 ml  Output 6241 ml  Net -3481.05 ml   Filed Weights   10/30/2019 0545 10/27/19 0500 10/29/19 0500  Weight: 93.5 kg 98.5 kg 95.8 kg   Examination:  GEN: Obese female, sedated  HEENT: ETT in place, minimal secretions CV: Tachycardic and irregular rhythm PULM: Diminished bases, passive on vent GI: Soft, hypoactive BS EXT: Global anasarca NEURO: [facility] Problem list   None  Assessment & Plan:  [name] is a 49 y.o. woman with history of SLE with thrombocytopenia, nephritis, AVN and possible mild chronic RV failure who presented for repair of severe symptomatic tricuspid regurgitation on 4/7.    Acute hypoxemic respiratory failure - Continue vent support - Usual VAP prevention measures  Cardiogenic Shock - V/A ecmo 4/8-4/12 - Started on CRRT with fluid removal rate of 200 cc /hr -  On milrinone 0.125 mcg/hr  - Nephrology onboard - Diuresis, AC, and inotropes per CHF/TCTS teams  SLE with immune mediated thrombocytopenia and coagulopathy - No other clinical evidence of bleeding.  - Continue IVIG (Day 2 of 5) - Monitor with AC resumption  Acute encephalopathy Status Epilepticus  - CT head with asymmetric  hypodensity within the right cerebellar hemisphere cannot exclude anoxic brain injury. - Antiepileptics per neurology.  - Off sedation. Neurology to help with prognosis. Minimize sedation.   Best practice:  Diet: TF with free water Pain/Anxiety/Delirium protocol (if indicated): Propofol  VAP protocol (if indicated): Ordered DVT prophylaxis: heparin gtt GI prophylaxis: Pantoprazole Glucose control: Controlled, blood sugars less than 180 Foley required for critically ill state Mobility: Bedrest Code Status: Full code Family Communication: Per primary team Disposition: Needs ICU  Please see attending attestation for final recommendations.   Ina Homes, MD  [facility] PGY3  Pager: [phone]

## 2019-10-30 NOTE — Progress Notes (Signed)
CT surgery p.m. Rounds  Patient had stable day, CVVH removing 200 cc/h Blood pressure stable Minimally responsive, gag present We will slowly wean inhaled nitric oxide Continue supportive care and follow neuro status

## 2019-10-30 NOTE — Progress Notes (Signed)
Nitric weaned by 1 ppm per MD order.  CVP 16.

## 2019-10-30 NOTE — Progress Notes (Signed)
Phenobarbital level came back low. Increasing scheduled dose to 160 mg IV BID from 130 mg BID.   Electronically signed: Dr. Kerney Elbe

## 2019-10-30 NOTE — Plan of Care (Signed)
  Problem: Respiratory: Goal: Respiratory status will improve Outcome: Progressing   Problem: Urinary Elimination: Goal: Ability to achieve and maintain adequate renal perfusion and functioning will improve Outcome: Progressing   Problem: Clinical Measurements: Goal: Ability to maintain clinical measurements within normal limits will improve Outcome: Progressing   Problem: Respiratory: Goal: Ability to maintain a clear airway and adequate ventilation will improve Outcome: Progressing

## 2019-10-30 NOTE — Progress Notes (Addendum)
Patient ID: [name], female   DOB: [birth year], 49 y.o.   MRN: [id number] P    Advanced Heart Failure Rounding Note  PCP-Cardiologist: No primary care provider on file.   Subjective:    Events: - 4/7 Underwent mini TV repair c/b diffuse coagulopathy and severe bleeding.  Received RBCS, PLTs, TXA, cryo, Factor 7, steroids - 4/7 take back to OR for bleeding and [facility] ECMO placement  - 4/8 Repeat take back to OR for washout - 4/9 Back to OR for right chest hematoma removal. TEE in OR showed EF 40-45%, mild-moderately decreased RV systolic function, s/p TV repair with mild TR.  - 4/10 EEG with status epilepticus. Head CT negative.  - 4/12 ECMO decannulated and chest closed. Atrial fibrillation in OR with epinephrine.  - 4/15 IVIG started for thrombocytopenia. CT head with artifact but possible asymmetric hypodensity in right cerebellar hemisphere that could reflect hypoxic/ischemic injury.   Remains on vent. Now off Versed and Propofol and on phenobarbital IV for seizures.  No movements/responses.  On milrinone 0.125, off NE, NO 29. On CVVH, currently pulling about 200 cc/hr net with CVP 11 and co-ox 64%. CI 2.5 from FloTrac.   Afebrile, WBCs 9.9.  She is off abx. Thick sputum per nurse.   Hgb 11 this am. PLTs 68 => 36 => 33 => 83 => 63 => 37 => 30.  She is getting IVIG.    Appears to be in atrial flutter in 110s range, on amiodarone 30 mg/hr.  BP preserved.    Objective:   Weight Range: 92.6 kg Body mass index is 31.97 kg/m.   Vital Signs:   Temp:  [96.8 F (36 C)-99.9 F (37.7 C)] 99 F (37.2 C) (04/16 0700) Pulse Rate:  [110-128] 119 (04/16 0424) Resp:  [0-37] 35 (04/16 0700) BP: (110-121)/(66-71) 121/71 (04/15 1509) SpO2:  [59 %-99 %] 96 % (04/16 0700) Arterial Line BP: (97-134)/(57-83) 120/76 (04/16 0700) FiO2 (%):  [50 %] 50 % (04/16 0424) Weight:  [92.6 kg] 92.6 kg (04/16 0500) Last BM Date: 10/29/19  Weight change: Filed Weights   10/27/19 0500 10/29/19 0500  10/30/19 0500  Weight: 98.5 kg 95.8 kg 92.6 kg    Intake/Output:   Intake/Output Summary (Last 24 hours) at 10/30/2019 0732 Last data filed at 10/30/2019 0700 Gross per 24 hour  Intake 2771.88 ml  Output 6127 ml  Net -3355.12 ml      Physical Exam    General: NAD Neck: JVP 10 cm, no thyromegaly or thyroid nodule.  Lungs: Decreased at bases.  CV: Nondisplaced PMI.  Heart tachy, regular S1/S2, no S3/S4, no murmur.  1+ edema to knees. Abdomen: Soft, nontender, no hepatosplenomegaly, no distention.  Skin: Intact without lesions or rashes.  Neurologic: Unresponsive Extremities: No clubbing or cyanosis.  HEENT: Normal.    Telemetry   Atrial flutter 110s (personally reviewed).    Labs    CBC Recent Labs    10/29/19 0323 10/29/19 0346 10/30/19 0427 10/30/19 0627  WBC 9.5  --  9.9  --   HGB 11.6*   < > 11.0* 11.2*  HCT 34.7*   < > 32.1* 33.0*  MCV 87.8  --  86.5  --   PLT 37*  --  30*  --    < > = values in this interval not displayed.   Basic Metabolic Panel Recent Labs    10/29/19 0323 10/29/19 0346 10/29/19 1652 10/29/19 1859 10/30/19 0427 10/30/19 0627  NA 136   < >  137   < > 135 137  K 4.4   < > 3.9   < > 4.2 3.8  CL 101   < > 103  --  101  --   CO2 23   < > 24  --  22  --   GLUCOSE 138*   < > 125*  --  145*  --   BUN 42*   < > 43*  --  39*  --   CREATININE 2.39*   < > 2.21*  --  1.92*  --   CALCIUM 8.2*   < > 7.7*  --  8.2*  --   MG 2.4  --   --   --  2.4  --   PHOS 6.5*   < > 5.5*  --  3.7  --    < > = values in this interval not displayed.   Liver Function Tests Recent Labs    10/29/19 0323 10/29/19 0323 10/29/19 1652 10/30/19 0427  AST 39  --   --  37  ALT 26  --   --  22  ALKPHOS 108  --   --  122  BILITOT 1.3*  --   --  1.4*  PROT 5.5*  --   --  5.8*  ALBUMIN 2.8*  2.8*   < > 2.4* 2.5*  2.5*   < > = values in this interval not displayed.   No results for input(s): LIPASE, AMYLASE in the last 72 hours. Cardiac Enzymes No results  for input(s): CKTOTAL, CKMB, CKMBINDEX, TROPONINI in the last 72 hours.  BNP: BNP (last 3 results) Recent Labs    04/15/19 1101  BNP 248.7*    ProBNP (last 3 results) No results for input(s): PROBNP in the last 8760 hours.   D-Dimer No results for input(s): DDIMER in the last 72 hours. Hemoglobin A1C No results for input(s): HGBA1C in the last 72 hours. Fasting Lipid Panel Recent Labs    10/28/19 1100  TRIG 84   Thyroid Function Tests No results for input(s): TSH, T4TOTAL, T3FREE, THYROIDAB in the last 72 hours.  Invalid input(s): FREET3  Other results:   Imaging    CT HEAD WO CONTRAST  Result Date: 10/29/2019 CLINICAL DATA:  Seizure, abnormal neuro exam, suspected anoxic brain injury. EXAM: CT HEAD WITHOUT CONTRAST TECHNIQUE: Contiguous axial images were obtained from the base of the skull through the vertex without intravenous contrast. COMPARISON:  Head CT 10/24/2019 FINDINGS: Brain: Evaluation limited by streak and beam hardening artifact arising from scalp monitoring leads. There is no evidence of acute intracranial hemorrhage, intracranial mass, midline shift or extra-axial fluid collection.No demarcated cortical infarction. Apparent asymmetric hypodensity within the right cerebellar hemisphere (series 3, image 8) (series 5, image 46). 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 Vascular: No hyperdense vessel. Skull: Normal. Negative for fracture or focal lesion. Sinuses/Orbits: Visualized orbits demonstrate no acute abnormality. Paranasal sinus mucosal thickening greatest within the bilateral ethmoid and sphenoid sinuses. Large bilateral mastoid effusions. Associated left middle ear effusion. IMPRESSION: Evaluation limited by streak and beam hardening artifact arising from scalp monitoring leads. Apparent asymmetric hypodensity within the right cerebellar hemisphere may reflect asymmetric beam hardening artifact. Edema related to infarct or hypoxic/ischemic  injury cannot be excluded. Consider brain MRI for further evaluation, as clinically warranted. Paranasal sinus mucosal thickening. Large bilateral mastoid effusions. Associated left middle ear effusion. Electronically Signed   By: Kellie Simmering DO   On: 10/29/2019 13:44  Medications:     Scheduled Medications: . B-complex with vitamin C  1 tablet Per Tube Daily  . bisacodyl  10 mg Oral Daily   Or  . bisacodyl  10 mg Rectal Daily  . chlorhexidine gluconate (MEDLINE KIT)  15 mL Mouth Rinse BID  . Chlorhexidine Gluconate Cloth  6 each Topical Daily  . feeding supplement (PIVOT 1.5 CAL)  1,000 mL Per Tube Q24H  . insulin aspart  0-24 Units Subcutaneous Q4H  . levothyroxine  50 mcg Per Tube Q0600  . mouth rinse  15 mL Mouth Rinse 10 times per day  . metolazone  5 mg Per Tube Daily  . pantoprazole (PROTONIX) IV  40 mg Intravenous QHS  . PHENObarbital  130 mg Intravenous BID  . sodium chloride flush  10-40 mL Intracatheter Q12H    Infusions: .  prismasol BGK 4/2.5 500 mL/hr at 10/30/19 0213  .  prismasol BGK 4/2.5 300 mL/hr at 10/29/19 0913  . sodium chloride Stopped (10/27/19 1308)  . sodium chloride 10 mL/hr at 10/27/19 1722  . amiodarone 60 mg/hr (10/30/19 0700)  . dextrose 20 mL/hr at 10/30/19 0700  . furosemide (LASIX) infusion Stopped (10/28/19 0824)  . Immune Globulin 10% 40 g (10/29/19 0954)  . lacosamide (VIMPAT) IV Stopped (10/29/19 2252)  . levETIRAcetam Stopped (10/29/19 2358)  . midazolam (VERSED) infusion Stopped (10/27/19 1548)  . milrinone 0.125 mcg/kg/min (10/30/19 0700)  . norepinephrine (LEVOPHED) Adult infusion Stopped (10/29/19 1902)  . prismasol BGK 4/2.5 1,500 mL/hr at 10/30/19 0644  . propofol (DIPRIVAN) infusion Stopped (10/29/19 0415)    PRN Medications: sodium chloride, sodium chloride, acetaminophen (TYLENOL) oral liquid 160 mg/5 mL, artificial tears, dextrose, dextrose, diphenhydrAMINE, heparin, heparin, metoprolol tartrate, midazolam, morphine  injection, ondansetron (ZOFRAN) IV, sodium chloride flush     Assessment/Plan   1. Post-cardiotomy hemorrhagic shock - Patient with refractory post-operative chest bleeding s/p minimally invasive TV repair on 11/05/2019 in setting of longstanding SLE - has received  RBCS, PLTs, TXA, cryo, Factor 7, steroids - ECMO started in OR - Repeat OR visit for right chest clot removal on 4/9 with improved hemodynamics, TEE with EF 40-45%, mild-moderately decreased RV function, stable replaced TV.  - ECMO decannulated 4/12.  - Stable now w/o overt bleeding.   2. CHF, primarily RV failure with cardiogenic shock - CVP 11 this morning with co-ox 64%, FloTrak CI 2.5. With anasarca and still markedly above baseline weight.   - Continue milrinone 0.125, off norepinephrine now.   - Hemodynamics are currently reasonably good, RV seems to be functioning reasonably well.  - Continue CVVH, pulling net 200 cc/hr and tolerating now.   3. 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  - plan as above  4. Post-operative respiratory failure - remains intubated, FiO2 0.5.   - Progressive airspace disease in R lung after mini thoracotomy, back to OR 4/9 for right chest clot removal.  - CCM following vent. - Off abx now, afebrile with normal WBCs.  - Repeat CXR today and send sputum culture.   5. Acute blood loss anemia - as above - continue to replace as needed  6. 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  - Plts down to 30K => Getting IVIG x 5 days and checking HIT (though likely negative).   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  - She is now on Vimpat and Keppra.  - With ongoing seizures, she is  now off Propofol and Versed, started phenobarbital IV.   - Head CT 4/15 with significant artifact but may show some anoxic injury.  Unable to get MRI due to retained wire.  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   8. SLE - no change  9. AKI  - CVVH ongoing, pulling 200 cc/hr.   10. Nutrition - Tube feeds started.   11. DVT prophylaxis - SCDs  12. Atrial fibrillation - Noted with epinephrine administration in OR 4/12.  - Atypical flutter on 4/13 but rate-controlled, then rapid atrial fibrillation after that.  - Today she is back in atrial flutter at rates in 110s (confirm with ECG).  - Not on anticoagulation yet, per surgery.   - Continue amiodarone gtt 30  CRITICAL CARE Performed by: Loralie Champagne  Total critical care time: 45 minutes  Critical care time was exclusive of separately billable procedures and treating other patients.  Critical care was necessary to treat or prevent imminent or life-threatening deterioration.  Critical care was time spent personally by me (independent of midlevel providers or residents) on the following activities: development of treatment plan with patient and/or surrogate as well as nursing, discussions with consultants, evaluation of patient's response to treatment, examination of patient, obtaining history from patient or surrogate, ordering and performing treatments and interventions, ordering and review of laboratory studies, ordering and review of radiographic studies, pulse oximetry and re-evaluation of patient's condition.    Length of Stay: 9  Loralie Champagne, MD  10/30/2019, 7:32 AM  Advanced Heart Failure Team Pager [phone] (M-F; 7a - 4p)  Please contact [facility] for night-coverage after hours (4p -7a ) and weekends on [email or web address]

## 2019-10-31 ENCOUNTER — Inpatient Hospital Stay (HOSPITAL_COMMUNITY): Payer: BC Managed Care – PPO

## 2019-10-31 DIAGNOSIS — D689 Coagulation defect, unspecified: Secondary | ICD-10-CM | POA: Diagnosis not present

## 2019-10-31 DIAGNOSIS — I50813 Acute on chronic right heart failure: Secondary | ICD-10-CM | POA: Diagnosis not present

## 2019-10-31 DIAGNOSIS — R092 Respiratory arrest: Secondary | ICD-10-CM | POA: Diagnosis not present

## 2019-10-31 DIAGNOSIS — J9601 Acute respiratory failure with hypoxia: Secondary | ICD-10-CM | POA: Diagnosis not present

## 2019-10-31 DIAGNOSIS — G40901 Epilepsy, unspecified, not intractable, with status epilepticus: Secondary | ICD-10-CM

## 2019-10-31 LAB — GLUCOSE, CAPILLARY
Glucose-Capillary: 100 mg/dL — ABNORMAL HIGH (ref 70–99)
Glucose-Capillary: 104 mg/dL — ABNORMAL HIGH (ref 70–99)
Glucose-Capillary: 109 mg/dL — ABNORMAL HIGH (ref 70–99)
Glucose-Capillary: 138 mg/dL — ABNORMAL HIGH (ref 70–99)
Glucose-Capillary: 80 mg/dL (ref 70–99)
Glucose-Capillary: 98 mg/dL (ref 70–99)

## 2019-10-31 LAB — POCT I-STAT 7, (LYTES, BLD GAS, ICA,H+H)
Acid-Base Excess: 4 mmol/L — ABNORMAL HIGH (ref 0.0–2.0)
Acid-Base Excess: 4 mmol/L — ABNORMAL HIGH (ref 0.0–2.0)
Bicarbonate: 27.2 mmol/L (ref 20.0–28.0)
Bicarbonate: 28.2 mmol/L — ABNORMAL HIGH (ref 20.0–28.0)
Calcium, Ion: 1.18 mmol/L (ref 1.15–1.40)
Calcium, Ion: 1.17 mmol/L (ref 1.15–1.40)
HCT: 40 % (ref 36.0–46.0)
HCT: 33 % — ABNORMAL LOW (ref 36.0–46.0)
Hemoglobin: 13.6 g/dL (ref 12.0–15.0)
Hemoglobin: 11.2 g/dL — ABNORMAL LOW (ref 12.0–15.0)
O2 Saturation: 99 %
O2 Saturation: 99 %
Patient temperature: 37.7
Patient temperature: 36.5
Potassium: 4.2 mmol/L (ref 3.5–5.1)
Potassium: 4.2 mmol/L (ref 3.5–5.1)
Sodium: 138 mmol/L (ref 135–145)
Sodium: 136 mmol/L (ref 135–145)
TCO2: 28 mmol/L (ref 22–32)
TCO2: 29 mmol/L (ref 22–32)
pCO2 arterial: 37.2 mmHg (ref 32.0–48.0)
pCO2 arterial: 40.5 mmHg (ref 32.0–48.0)
pH, Arterial: 7.474 — ABNORMAL HIGH (ref 7.350–7.450)
pH, Arterial: 7.449 (ref 7.350–7.450)
pO2, Arterial: 130 mmHg — ABNORMAL HIGH (ref 83.0–108.0)
pO2, Arterial: 134 mmHg — ABNORMAL HIGH (ref 83.0–108.0)

## 2019-10-31 LAB — RENAL FUNCTION PANEL
Albumin: 2.4 g/dL — ABNORMAL LOW (ref 3.5–5.0)
Albumin: 2.2 g/dL — ABNORMAL LOW (ref 3.5–5.0)
Anion gap: 8 (ref 5–15)
Anion gap: 7 (ref 5–15)
BUN: 39 mg/dL — ABNORMAL HIGH (ref 6–20)
BUN: 39 mg/dL — ABNORMAL HIGH (ref 6–20)
CO2: 25 mmol/L (ref 22–32)
CO2: 25 mmol/L (ref 22–32)
Calcium: 8.2 mg/dL — ABNORMAL LOW (ref 8.9–10.3)
Calcium: 7.8 mg/dL — ABNORMAL LOW (ref 8.9–10.3)
Chloride: 102 mmol/L (ref 98–111)
Chloride: 101 mmol/L (ref 98–111)
Creatinine, Ser: 1.79 mg/dL — ABNORMAL HIGH (ref 0.44–1.00)
Creatinine, Ser: 1.63 mg/dL — ABNORMAL HIGH (ref 0.44–1.00)
GFR calc Af Amer: 39 mL/min — ABNORMAL LOW (ref >60)
GFR calc Af Amer: 43 mL/min — ABNORMAL LOW (ref >60)
GFR calc non Af Amer: 33 mL/min — ABNORMAL LOW (ref >60)
GFR calc non Af Amer: 37 mL/min — ABNORMAL LOW (ref >60)
Glucose, Bld: 100 mg/dL — ABNORMAL HIGH (ref 70–99)
Glucose, Bld: 151 mg/dL — ABNORMAL HIGH (ref 70–99)
Phosphorus: 2.2 mg/dL — ABNORMAL LOW (ref 2.5–4.6)
Phosphorus: 3.2 mg/dL (ref 2.5–4.6)
Potassium: 4.2 mmol/L (ref 3.5–5.1)
Potassium: 4.2 mmol/L (ref 3.5–5.1)
Sodium: 135 mmol/L (ref 135–145)
Sodium: 133 mmol/L — ABNORMAL LOW (ref 135–145)

## 2019-10-31 LAB — HEPATIC FUNCTION PANEL
ALT: 22 U/L (ref 0–44)
AST: 44 U/L — ABNORMAL HIGH (ref 15–41)
Albumin: 2.4 g/dL — ABNORMAL LOW (ref 3.5–5.0)
Alkaline Phosphatase: 133 U/L — ABNORMAL HIGH (ref 38–126)
Bilirubin, Direct: 0.4 mg/dL — ABNORMAL HIGH (ref 0.0–0.2)
Indirect Bilirubin: 0.6 mg/dL (ref 0.3–0.9)
Total Bilirubin: 1 mg/dL (ref 0.3–1.2)
Total Protein: 6.4 g/dL — ABNORMAL LOW (ref 6.5–8.1)

## 2019-10-31 LAB — COOXEMETRY PANEL
Carboxyhemoglobin: 1.5 % (ref 0.5–1.5)
Methemoglobin: 1 % (ref 0.0–1.5)
O2 Saturation: 61.6 %
Total hemoglobin: 9.2 g/dL — ABNORMAL LOW (ref 12.0–16.0)

## 2019-10-31 LAB — MAGNESIUM: Magnesium: 2.5 mg/dL — ABNORMAL HIGH (ref 1.7–2.4)

## 2019-10-31 LAB — PROTIME-INR
INR: 1.1 (ref 0.8–1.2)
Prothrombin Time: 13.9 seconds (ref 11.4–15.2)

## 2019-10-31 MED ORDER — SODIUM PHOSPHATES 45 MMOLE/15ML IV SOLN
30.0000 mmol | Freq: Once | INTRAVENOUS | Status: AC
  Administered 2019-10-31: 30 mmol via INTRAVENOUS
  Filled 2019-10-31: qty 10

## 2019-10-31 MED ORDER — PHENYLEPHRINE HCL-NACL 10-0.9 MG/250ML-% IV SOLN
INTRAVENOUS | Status: AC
  Filled 2019-10-31: qty 250

## 2019-10-31 NOTE — Procedures (Signed)
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; s/p minimmally invasive TV repair, hemorrhagic / cardiogenic shock postoperative, status epiletpicus, TCP on IVIG  Current CRRT Prescription: Catheter: R IJ  BFR: 200 Pre Blood Pump: 500 4K DFR: 1500 4K Replacement Rate: 300 4K Goal UF: 269m / h net neg Anticoagulation: None Clotting: about 1x/24h  S:  On milrinone, off pressors  K 4.2, phosphorus 2.2  Anuric  Tol UF, 3.6L neg yesterday  O: 04/16 0701 - 04/17 0700 In: 3540.2 [I.V.:1890.1; NG/GT:1380; IV Piggyback:270] Out: 7199 [Urine:100; Stool:300; Chest Tube:100]  Filed Weights   10/29/19 0500 10/30/19 0500 10/31/19 0432  Weight: 95.8 kg 92.6 kg 88 kg    Recent Labs  Lab 10/30/19 0427 10/30/19 0627 10/30/19 1604 10/30/19 1604 10/30/19 1702 10/31/19 0418 10/31/19 0432  NA 135   < > 133*   < > 138 138 135  K 4.2   < > 3.9   < > 3.8 4.2 4.2  CL 101  --  100  --   --   --  102  CO2 22  --  23  --   --   --  25  GLUCOSE 145*  --  160*  --   --   --  100*  BUN 39*  --  40*  --   --   --  39*  CREATININE 1.92*  --  1.75*  --   --   --  1.79*  CALCIUM 8.2*  --  7.9*  --   --   --  8.2*  PHOS 3.7  --  2.7  --   --   --  2.2*   < > = values in this interval not displayed.   Recent Labs  Lab 10/29/19 0323 10/29/19 0346 10/30/19 0427 10/30/19 0427 10/30/19 0627 10/30/19 1702 10/31/19 0418  WBC 9.5  --  9.9  --   --   --  10.9*  HGB 11.6*   < > 11.0*   < > 11.2* 10.5* 10.6*  13.6  HCT 34.7*   < > 32.1*   < > 33.0* 31.0* 31.2*  40.0  MCV 87.8  --  86.5  --   --   --  87.6  PLT 37*  --  30*  --   --   --  29*   < > = values in this interval not displayed.    Scheduled Meds: . B-complex with vitamin C  1 tablet Per Tube Daily  . bisacodyl  10 mg Oral Daily   Or  . bisacodyl  10 mg Rectal Daily  . chlorhexidine gluconate (MEDLINE KIT)  15 mL Mouth Rinse BID  . Chlorhexidine Gluconate Cloth  6 each Topical  Daily  . feeding supplement (PIVOT 1.5 CAL)  1,000 mL Per Tube Q24H  . insulin aspart  0-24 Units Subcutaneous Q4H  . levothyroxine  50 mcg Per Tube Q0600  . mouth rinse  15 mL Mouth Rinse 10 times per day  . pantoprazole (PROTONIX) IV  40 mg Intravenous QHS  . PHENObarbital  160 mg Intravenous BID  . sodium chloride flush  10-40 mL Intracatheter Q12H   Continuous Infusions: .  prismasol BGK 4/2.5 500 mL/hr at 10/30/19 2319  .  prismasol BGK 4/2.5 300 mL/hr at 10/29/19 0913  . sodium chloride Stopped (10/27/19 1308)  . sodium chloride Stopped (10/30/19 0957)  . amiodarone 60 mg/hr (10/31/19 04403  . dextrose Stopped (10/30/19  0156)  . furosemide (LASIX) infusion Stopped (10/28/19 0824)  . Immune Globulin 10% 40 g (10/29/19 0954)  . lacosamide (VIMPAT) IV Stopped (10/30/19 2245)  . levETIRAcetam Stopped (10/30/19 2351)  . midazolam (VERSED) infusion Stopped (10/27/19 1548)  . milrinone 0.125 mcg/kg/min (10/31/19 0600)  . norepinephrine (LEVOPHED) Adult infusion Stopped (10/29/19 1902)  . prismasol BGK 4/2.5 1,500 mL/hr at 10/31/19 0350  . propofol (DIPRIVAN) infusion Stopped (10/29/19 0415)   PRN Meds:.sodium chloride, sodium chloride, acetaminophen (TYLENOL) oral liquid 160 mg/5 mL, artificial tears, dextrose, dextrose, diphenhydrAMINE, heparin, heparin, metoprolol tartrate, midazolam, morphine injection, ondansetron (ZOFRAN) IV, sodium chloride flush  ABG    Component Value Date/Time   PHART 7.474 (H) 10/31/2019 0418   PCO2ART 37.2 10/31/2019 0418   PO2ART 130.0 (H) 10/31/2019 0418   HCO3 27.2 10/31/2019 0418   TCO2 28 10/31/2019 0418   ACIDBASEDEF 1.0 10/28/2019 1841   O2SAT 61.6 10/31/2019 0418   O2SAT 99.0 10/31/2019 0418    A/P  1. 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 Shock, cardiogenic and hemorrhagic, on inotrope; significant RV failure 3. VDRF. Inhaled NO2 per CCM 4. Status post minimally invasive tricuspid valve repair 11/12/2019 5. ABLA   6. 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 SLE 8. ITP, on IVIG  Cont CRRT current settings.  Replete Phos today. Recommend palliative involvement  Rexene Agent , MD [facility]

## 2019-10-31 NOTE — Progress Notes (Signed)
CT surgery p.m. Rounds  Stable day with removal of 300-400 cc/h with CVVH Nitric oxide weaned down to 15 ppm without change in hemodynamics or CVP P.m. labs with PO2 130 potassium 4.2 bicarb 28.2 hemoglobin 11.2

## 2019-10-31 NOTE — Progress Notes (Signed)
RT note- sp02 decresed 86%, fio2 and PEEP raised to 60% and +8 PEEP. Nitric has been weaned as ordered.

## 2019-10-31 NOTE — Progress Notes (Signed)
Patient ID: [name], female   DOB: 07/26/1970, 49 y.o.   MRN: [id number] P    Advanced Heart Failure Rounding Note  PCP-Cardiologist: No primary care provider on file.   Subjective:    Events: - 4/7 Underwent mini TV repair c/b diffuse coagulopathy and severe bleeding.  Received RBCS, PLTs, TXA, cryo, Factor 7, steroids - 4/7 take back to OR for bleeding and [facility] ECMO placement  - 4/8 Repeat take back to OR for washout - 4/9 Back to OR for right chest hematoma removal. TEE in OR showed EF 40-45%, mild-moderately decreased RV systolic function, s/p TV repair with mild TR.  - 4/10 EEG with status epilepticus. Head CT negative.  - 4/12 ECMO decannulated and chest closed. Atrial fibrillation in OR with epinephrine.  - 4/15 IVIG started for thrombocytopenia. CT head with artifact but possible asymmetric hypodensity in right cerebellar hemisphere that could reflect hypoxic/ischemic injury.   Remains on vent. Now off Versed and Propofol and on phenobarbital IV for seizures.  Per neurology consultant's exam, has only cranial nerve reflexes off sedation.   On milrinone 0.125, off NE, NO 23. On CVVH, currently pulling around 200 cc/hr net with CVP 13 and co-ox 62%. CI 2.5 from FloTrac.   Afebrile, WBCs 10.9  She is off abx.   Hgb 10.6 this am. PLTs 68 => 36 => 33 => 83 => 63 => 37 => 30 => 29.  She is getting IVIG.  No overt bleeding, no anticoagulation  She is in atrial flutter rate around 120 with amiodarone 60 mg/hr.     Objective:   Weight Range: 88 kg Body mass index is 30.39 kg/m.   Vital Signs:   Temp:  [96.4 F (35.8 C)-99.9 F (37.7 C)] 99.7 F (37.6 C) (04/17 0600) Pulse Rate:  [104-118] 104 (04/17 0300) Resp:  [0-38] 3 (04/17 0600) BP: (104-120)/(67-73) 120/73 (04/16 1821) SpO2:  [92 %-100 %] 100 % (04/17 0600) Arterial Line BP: (89-131)/(62-84) 117/69 (04/17 0600) FiO2 (%):  [50 %] 50 % (04/17 0400) Weight:  [88 kg] 88 kg (04/17 0432) Last BM Date:  10/31/19  Weight change: Filed Weights   10/29/19 0500 10/30/19 0500 10/31/19 0432  Weight: 95.8 kg 92.6 kg 88 kg    Intake/Output:   Intake/Output Summary (Last 24 hours) at 10/31/2019 0718 Last data filed at 10/31/2019 0600 Gross per 24 hour  Intake 3540.15 ml  Output 7199 ml  Net -3658.85 ml      Physical Exam    General: NAD, intubated Neck: JVP 10 cm, no thyromegaly or thyroid nodule.  Lungs: Decreased at bases.  CV: Nondisplaced PMI.  Heart tachy, regular S1/S2, no S3/S4, no murmur.  1+ edema to knees.  Abdomen: Soft, nontender, no hepatosplenomegaly, no distention.  Skin: Intact without lesions or rashes.  Neurologic: Unresponsive Extremities: No clubbing or cyanosis.  HEENT: Normal.    Telemetry   Atrial flutter rate around 120 (personally reviewed).    Labs    CBC Recent Labs    10/30/19 0427 10/30/19 0627 10/30/19 1702 10/31/19 0418  WBC 9.9  --   --  10.9*  HGB 11.0*   < > 10.5* 10.6*  13.6  HCT 32.1*   < > 31.0* 31.2*  40.0  MCV 86.5  --   --  87.6  PLT 30*  --   --  29*   < > = values in this interval not displayed.   Basic Metabolic Panel Recent Labs    10/30/19 0427  10/30/19 0627 10/30/19 1604 10/30/19 1702 10/31/19 0418 10/31/19 0432  NA 135   < > 133*   < > 138 135  K 4.2   < > 3.9   < > 4.2 4.2  CL 101   < > 100  --   --  102  CO2 22   < > 23  --   --  25  GLUCOSE 145*   < > 160*  --   --  100*  BUN 39*   < > 40*  --   --  39*  CREATININE 1.92*   < > 1.75*  --   --  1.79*  CALCIUM 8.2*   < > 7.9*  --   --  8.2*  MG 2.4  --   --   --  2.5*  --   PHOS 3.7   < > 2.7  --   --  2.2*   < > = values in this interval not displayed.   Liver Function Tests Recent Labs    10/30/19 0427 10/30/19 1604 10/31/19 0418 10/31/19 0432  AST 37  --  44*  --   ALT 22  --  22  --   ALKPHOS 122  --  133*  --   BILITOT 1.4*  --  1.0  --   PROT 5.8*  --  6.4*  --   ALBUMIN 2.5*  2.5*   < > 2.4* 2.4*   < > = values in this interval not  displayed.   No results for input(s): LIPASE, AMYLASE in the last 72 hours. Cardiac Enzymes No results for input(s): CKTOTAL, CKMB, CKMBINDEX, TROPONINI in the last 72 hours.  BNP: BNP (last 3 results) Recent Labs    04/15/19 1101  BNP 248.7*    ProBNP (last 3 results) No results for input(s): PROBNP in the last 8760 hours.   D-Dimer No results for input(s): DDIMER in the last 72 hours. Hemoglobin A1C No results for input(s): HGBA1C in the last 72 hours. Fasting Lipid Panel Recent Labs    10/28/19 1100  TRIG 84   Thyroid Function Tests No results for input(s): TSH, T4TOTAL, T3FREE, THYROIDAB in the last 72 hours.  Invalid input(s): FREET3  Other results:   Imaging    DG CHEST PORT 1 VIEW  Result Date: 10/30/2019 CLINICAL DATA:  CHF. EXAM: PORTABLE CHEST 1 VIEW COMPARISON:  10/28/2019 chest radiographs. FINDINGS: Interval retraction of endotracheal tube terminating approximately 7.7 cm above the carina. Interval removal of epicardial pacing leads and left IJ approach CVC. Remaining support devices are unchanged. Right upper lung opacities and right basilar consolidation, unchanged. Left basilar atelectasis, unchanged. Moderate right and small left pleural effusions, unchanged. Partially obscured cardiomediastinal silhouette. Sequela of tricuspid annuloplasty. IMPRESSION: Interval retraction of ETT terminating 7.7 cm above the carina, previously 5.4 cm. Interval removal of epicardial pacing leads. Otherwise stable postsurgical appearance of the chest. Electronically Signed   By: Primitivo Gauze M.D.   On: 10/30/2019 09:54     Medications:     Scheduled Medications: . B-complex with vitamin C  1 tablet Per Tube Daily  . bisacodyl  10 mg Oral Daily   Or  . bisacodyl  10 mg Rectal Daily  . chlorhexidine gluconate (MEDLINE KIT)  15 mL Mouth Rinse BID  . Chlorhexidine Gluconate Cloth  6 each Topical Daily  . feeding supplement (PIVOT 1.5 CAL)  1,000 mL Per Tube  Q24H  . insulin aspart  0-24 Units Subcutaneous Q4H  .  levothyroxine  50 mcg Per Tube Q0600  . mouth rinse  15 mL Mouth Rinse 10 times per day  . pantoprazole (PROTONIX) IV  40 mg Intravenous QHS  . PHENObarbital  160 mg Intravenous BID  . sodium chloride flush  10-40 mL Intracatheter Q12H    Infusions: .  prismasol BGK 4/2.5 500 mL/hr at 10/30/19 2319  .  prismasol BGK 4/2.5 300 mL/hr at 10/29/19 0913  . sodium chloride Stopped (10/27/19 1308)  . sodium chloride Stopped (10/30/19 0957)  . amiodarone 60 mg/hr (10/31/19 2993)  . dextrose Stopped (10/30/19 0959)  . furosemide (LASIX) infusion Stopped (10/28/19 0824)  . Immune Globulin 10% 40 g (10/29/19 0954)  . lacosamide (VIMPAT) IV Stopped (10/30/19 2245)  . levETIRAcetam Stopped (10/30/19 2351)  . midazolam (VERSED) infusion Stopped (10/27/19 1548)  . milrinone 0.125 mcg/kg/min (10/31/19 0600)  . norepinephrine (LEVOPHED) Adult infusion Stopped (10/29/19 1902)  . prismasol BGK 4/2.5 1,500 mL/hr at 10/31/19 0350  . propofol (DIPRIVAN) infusion Stopped (10/29/19 0415)  . sodium phosphate  Dextrose 5% IVPB      PRN Medications: sodium chloride, sodium chloride, acetaminophen (TYLENOL) oral liquid 160 mg/5 mL, artificial tears, dextrose, dextrose, diphenhydrAMINE, heparin, heparin, metoprolol tartrate, midazolam, morphine injection, ondansetron (ZOFRAN) IV, sodium chloride flush     Assessment/Plan   1. Post-cardiotomy hemorrhagic shock - Patient with refractory post-operative chest bleeding s/p minimally invasive TV repair on 10/21/2019 in setting of longstanding SLE - has received  RBCS, PLTs, TXA, cryo, Factor 7, steroids - ECMO started in OR - Repeat OR visit for right chest clot removal on 4/9 with improved hemodynamics, TEE with EF 40-45%, mild-moderately decreased RV function, stable replaced TV.  - ECMO decannulated 4/12.  - Stable now w/o overt bleeding.   2. CHF, primarily RV failure with cardiogenic shock - CVP 13  this morning with co-ox 62%, FloTrak CI 2.5. Still volume overloaded and well above baseline weight, but down 10 lbs over the last day.   - Continue milrinone 0.125, off norepinephrine now and MAP stable despite atrial flutter.   - Hemodynamics are currently good, RV seems to be functioning reasonably well. Titrating off NO today.  - Continue CVVH, pulling net 200 cc/hr and tolerating now.   3. 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  - plan as above  4. Post-operative respiratory failure - remains intubated, FiO2 0.5.   - Progressive airspace disease in R lung after mini thoracotomy, back to OR 4/9 for right chest clot removal.  - CCM following vent. - Off abx now, afebrile with normal WBCs.  - CXR with stable right-sided opacities.   5. Acute blood loss anemia - Now stable.   6. 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  - HIT negative - Plts down to 29K => Getting IVIG x 5 days.  - No overt bleeding, not anticoagulated.   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  - She is now on Vimpat and Keppra.  - With ongoing seizures, she is now off Propofol and Versed, started phenobarbital IV.  No further seizure activity.  - Head CT 4/15 with significant artifact but may show some anoxic injury.  Unable to get MRI due to retained wire.  - No awakening yet off sedation, only cranial nerve reflexes now 48+ hrs off sedation.  Concern for poor neurologic recovery.  Continue to limit sedation.   8. SLE - no change  9.  AKI  - CVVH ongoing, pulling 200 cc/hr.   10. Nutrition - Tube feeds.   11. DVT prophylaxis - SCDs  12. Atrial fibrillation/flutter - Noted with epinephrine administration in OR 4/12.  - Atypical flutter on 4/13 but rate-controlled, then rapid atrial fibrillation after that.  - She is now in atrial flutter at rates around 120.    - Not on anticoagulation  yet with thrombocytopenia.    - Continue amiodarone gtt 60  CRITICAL CARE Performed by: Loralie Champagne  Total critical care time: 45 minutes  Critical care time was exclusive of separately billable procedures and treating other patients.  Critical care was necessary to treat or prevent imminent or life-threatening deterioration.  Critical care was time spent personally by me (independent of midlevel providers or residents) on the following activities: development of treatment plan with patient and/or surrogate as well as nursing, discussions with consultants, evaluation of patient's response to treatment, examination of patient, obtaining history from patient or surrogate, ordering and performing treatments and interventions, ordering and review of laboratory studies, ordering and review of radiographic studies, pulse oximetry and re-evaluation of patient's condition.    Length of Stay: [place], MD  10/31/2019, 7:18 AM  Advanced Heart Failure Team Pager [phone] (M-F; 7a - 4p)  Please contact [facility] for night-coverage after hours (4p -7a ) and weekends on [email or web address]

## 2019-10-31 NOTE — Progress Notes (Signed)
5 Days Post-Op Procedure(s) (LRB): MEDIASTERNAL WASH OUT,  ECMO DECANNULATION AND STERNAL CLOSURE (N/A) TRANSESOPHAGEAL ECHOCARDIOGRAM (TEE) (N/A) Subjective: CVVH removed 4 kg of fluid yesterday, blood pressure stable Inhaled nitric oxide slowly being weaned off with minimal increase in CVP Patient shows withdrawal to pain but no purposeful movement Urine output yesterday 100 cc-leave Foley catheter for now Tube feeds at goal  Objective: Vital signs in last 24 hours: Temp:  [96.4 F (35.8 C)-99.9 F (37.7 C)] 98.8 F (37.1 C) (04/17 0915) Pulse Rate:  [104-118] 104 (04/17 0300) Cardiac Rhythm: Atrial flutter (04/17 0800) Resp:  [0-37] 31 (04/17 0915) BP: (104-120)/(67-73) 120/73 (04/16 1821) SpO2:  [92 %-100 %] 99 % (04/17 0915) Arterial Line BP: (89-131)/(62-79) 111/64 (04/17 0915) FiO2 (%):  [50 %] 50 % (04/17 0800) Weight:  [88 kg] 88 kg (04/17 0432)  Hemodynamic parameters for last 24 hours: CVP:  [10 mmHg-16 mmHg] 16 mmHg  Intake/Output from previous day: 04/16 0701 - 04/17 0700 In: 3636.8 [I.V.:1926.8; NG/GT:1440; IV Piggyback:270] Out: 7399 [Urine:100; Stool:500; Chest Tube:100] Intake/Output this shift: Total I/O In: 233.7 [I.V.:74; NG/GT:120; IV Piggyback:39.7] Out: 337 [Urine:10; Other:317; Chest Tube:10]  Sedated on vent without purposeful response Scattered rhonchi Atrial flutter Surgical incisions clean and dry  Lab Results: Recent Labs    10/30/19 0427 10/30/19 0627 10/30/19 1702 10/31/19 0418  WBC 9.9  --   --  10.9*  HGB 11.0*   < > 10.5* 10.6*  13.6  HCT 32.1*   < > 31.0* 31.2*  40.0  PLT 30*  --   --  29*   < > = values in this interval not displayed.   BMET:  Recent Labs    10/30/19 1604 10/30/19 1702 10/31/19 0418 10/31/19 0432  NA 133*   < > 138 135  K 3.9   < > 4.2 4.2  CL 100  --   --  102  CO2 23  --   --  25  GLUCOSE 160*  --   --  100*  BUN 40*  --   --  39*  CREATININE 1.75*  --   --  1.79*  CALCIUM 7.9*  --   --  8.2*    < > = values in this interval not displayed.    PT/INR:  Recent Labs    10/31/19 0418  LABPROT 13.9  INR 1.1   ABG    Component Value Date/Time   PHART 7.474 (H) 10/31/2019 0418   HCO3 27.2 10/31/2019 0418   TCO2 28 10/31/2019 0418   ACIDBASEDEF 1.0 10/28/2019 1841   O2SAT 61.6 10/31/2019 0418   O2SAT 99.0 10/31/2019 0418   CBG (last 3)  Recent Labs    10/30/19 2344 10/31/19 0415 10/31/19 0720  GLUCAP 91 98 80    Assessment/Plan: S/P Procedure(s) (LRB): MEDIASTERNAL WASH OUT,  ECMO DECANNULATION AND STERNAL CLOSURE (N/A) TRANSESOPHAGEAL ECHOCARDIOGRAM (TEE) (N/A) Continue slow inhaled nitric oxide wean following CVP and cardiac output Continue support and follow neuro status   LOS: 10 days    Tharon Aquas Trigt III 10/31/2019

## 2019-10-31 NOTE — Progress Notes (Addendum)
NAME:  [name], MRN:  [id number], DOB:  Mar 24, 1971, LOS: 10 ADMISSION DATE:  10/27/2019, CONSULTATION DATE:  10/31/2019  REFERRING MD:  Rexene Alberts, MD, CHIEF COMPLAINT:  Vent management.  History of present illness   The patient is a 49 year old woman who is postoperative day 10 from minimally invasive tricuspid valve repair which was then converted into an open procedure.  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  However despite echocardiograms showing severely elevated RA pressures, she has had 2 right heart cath that demonstrated normal peripheral vascular resistance for Group 1 pulmonary arterial hypertension.  She was taken to the OR by Dr. Roxy Manns on 4/7 given her persistent symptoms for repair of severe tricuspid regurgitation.  4/8, she had reexploration of her median sternotomy with concern for postoperative bleeding.  There was a large amount of clot evacuated from the anterior mediastinum and significant oozing diffusely.  Does not appear that there were any significant signs for mechanical active bleeding.  Bili at that time she was cannulated for ECMO and started on [place] ECMO on 4/8.  Since then she has had substantial correction of her coagulopathy including Kcentra, FEIBA, cryoprecipitate, FFP and copious blood products given her substantial bleeding in the mediastinum.  She went for decannulation from [facility] ECMO on 4/12. She remains critically ill with recurrent seizure like activity from a presumed anoxic brain injury.   PCCM is being consulted to assist with vent management.  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   Consults:  Heart failure, PCCM  Procedures:  4/7 tricuspid valve repair through median sternotomy 4/8 reexploration of the mediastinum with cannulation for ECMO 4/12 decannulation from [facility] ECMO  4/14: HD cath right IJ  Significant Diagnostic Tests:  Transesophageal echocardiogram January 2021  1. Left ventricular ejection fraction, by visual estimation, is 60 to  65%. The left ventricle has normal function. There is no left ventricular  hypertrophy.  2. The left ventricle has no regional wall motion abnormalities.  3. Global right ventricle has mildly reduced systolic function.The right  ventricular size is normal. No increase in right ventricular wall  thickness.  4. Left atrial size was moderately dilated.  5. Right atrial size was severely dilated.  6. The mitral valve is normal in structure. Trivial mitral valve  regurgitation.  7. The tricuspid valve is abnormal.  8. The tricuspid valve is abnormal. Tricuspid valve regurgitation is  severe.  9. Apparent mild restriction of septal leaflet. RVSP ~ 34mmHG.  10. The aortic valve is normal in structure. Aortic valve regurgitation is  not visualized.  11. The pulmonic valve was grossly normal. Pulmonic valve regurgitation is  not visualized.  12. Mild plaque invoving the descending aorta.  13. Severely elevated pulmonary artery systolic pressure.   Right heart catheterization June 2020 Findings:  RA = 5 RV = 34/7 PA =  34/6 (20) PCW = 8 Fick cardiac output/index = 5.9/3.1 PVR = 2.0 WU Ao sat = 100% PA sat = 71%, 72% SVC sat = 66%  Assessment: 1. Minimally elevated PA  pressures with normal PVR and cardiac output 2. No evidence of significant intracardiac shunt  CT Head  4/15: Evaluation limited by streak and beam hardening artifact arising from scalp monitoring leads.  Apparent asymmetric hypodensity within the right cerebellar hemisphere may reflect asymmetric beam hardening artifact. Edema related to infarct or hypoxic/ischemic injury cannot be excluded. Consider brain MRI for further evaluation, as clinically warranted.  Paranasal sinus mucosal thickening.  Large bilateral mastoid effusions. Associated left middle ear effusion.  Micro Data:  4/16: Respiratory cultures pending   Antimicrobials:  Levofloxacin 4/7 >> 4/8 Vancomycin 4/7 >> 4/12 Meropenem 4/9 >> 4/13  Subjective:  Remains off sedation. On milrinone 0.125 mcg. Coox down to 61% this AM. Net negative 3.7L over the last 24 hours.   Objective   Blood pressure 120/73, pulse (!) 104, temperature 99.7 F (37.6 C), resp. rate (!) 3, height 5\' 7"  (1.702 m), weight 88 kg, last menstrual period 04/02/2017, SpO2 100 %. CVP:  [10 mmHg-15 mmHg] 12 mmHg  Vent Mode: PRVC FiO2 (%):  [50 %] 50 % Set Rate:  [26 bmp] 26 bmp Vt Set:  [400 mL] 400 mL PEEP:  [5 cmH20] 5 cmH20 Plateau Pressure:  [17 cmH20-23 cmH20] 21 cmH20   Intake/Output Summary (Last 24 hours) at 10/31/2019 3570 Last data filed at 10/31/2019 0600 Gross per 24 hour  Intake 3657.09 ml  Output 7379 ml  Net -3721.91 ml   Filed Weights   10/29/19 0500 10/30/19 0500 10/31/19 0432  Weight: 95.8 kg 92.6 kg 88 kg   Examination:  GEN: Obese female, unresponsive HEENT: ETT in place, minimal secretions CV: Tachycardic and irregular rhythm PULM: Diminished bases, passive on vent GI: Soft, hypoactive BS EXT: Pitting edema around the sacrum NEURO: Unresponsive, + gag, is triggering the vent  Resolved Hospital Problem list   None  Assessment & Plan:  [name] is a 49 y.o. woman with history of SLE with thrombocytopenia, nephritis, AVN and possible mild chronic RV failure who presented for repair of severe symptomatic  tricuspid regurgitation on 4/7.    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  - Continue IVIG (Day 3 of 5) - Monitor with AC resumption  Acute encephalopathy Status Epilepticus  - Antiepileptics per neurology.  - Neurology to continue to help with prognosis  Best practice:  Diet: TF with free water Pain/Anxiety/Delirium protocol (if indicated): Propofol (not running) VAP protocol (if indicated): Ordered DVT prophylaxis: heparin gtt GI prophylaxis: Pantoprazole Glucose control: Controlled, blood sugars less than 180 Foley required for critically ill state Mobility: Bedrest Code Status: Full code Family Communication: Per primary team Disposition: Needs ICU  Please see attending attestation for final recommendations.   Ina Homes, MD  [facility] PGY3  Pager: [phone]   PCCM Attending:  This is a 49 year old female past medical history of SLE, thrombocytopenia nephritis avascular necrosis chronic right ventricular failure and symptomatic tricuspid regurgitation.  Patient underwent tricuspid valve repair.  Postoperatively developed acute hypoxemic respiratory failure, cardiogenic shock requiring full mechanical support to include [facility] ECMO.  Patient was on [place] a ECMO 11/09/2019 through 10/25/2019.  Hospital course  complicated by status epilepticus currently treated with burst suppression and now off sedation for more than 48 hours.  BP 120/73   Pulse (!) 104   Temp 99.1 F (37.3 C)   Resp (!) 33   Ht 5\' 7"  (1.702 m)   Wt 88 kg   LMP 04/02/2017   SpO2 99%   BMI 30.39  kg/m   General: Young female intubated on mechanical life support critically ill HEENT: Trachea midline, endotracheal tube in place, not tracking Neuro: Breathing over ventilator, initiates on breath, no response to pain Heart: Regular rate rhythm S1-S2 Lungs: Bilateral mechanically ventilated breath sounds Extremities: Dependent edema  Labs: Reviewed Chest x-ray: Attempted to review but PACS system is down this morning.  I would look at later once PACS system is back up.  Assessment: Acute hypoxemic respiratory failure requiring intubation mechanical ventilation Cardiogenic shock, slowly resolving now off VA ECMO Acute on chronic renal failure now requiring CVVHD SLE with immune related thrombocytopenia and nephritis Acute encephalopathy metabolic related to above Status epilepticus  Plan: AEDs per neurology We appreciate neurologist input for prognostication Pressor and milrinone guided by cardiothoracic surgery. Vent management continue full vent support at this time. All ventilator associated pneumonia prevention prophylaxis measures. Lines and tubes reviewed today Continue tube feeds Continue diuresis and fluid removal with CVVHD per nephrology.  This patient is critically ill with multiple organ system failure; which, requires frequent high complexity decision making, assessment, support, evaluation, and titration of therapies. This was completed through the application of advanced monitoring technologies and extensive interpretation of multiple databases. During this encounter critical care time was devoted to patient care services described in this note for 32 minutes.  Garner Nash, DO [facility] 10/31/2019 8:51 AM

## 2019-11-01 ENCOUNTER — Inpatient Hospital Stay (HOSPITAL_COMMUNITY): Payer: BC Managed Care – PPO

## 2019-11-01 DIAGNOSIS — J942 Hemothorax: Secondary | ICD-10-CM | POA: Diagnosis not present

## 2019-11-01 DIAGNOSIS — I50813 Acute on chronic right heart failure: Secondary | ICD-10-CM | POA: Diagnosis not present

## 2019-11-01 DIAGNOSIS — M329 Systemic lupus erythematosus, unspecified: Secondary | ICD-10-CM | POA: Diagnosis not present

## 2019-11-01 DIAGNOSIS — R092 Respiratory arrest: Secondary | ICD-10-CM | POA: Diagnosis not present

## 2019-11-01 DIAGNOSIS — Z515 Encounter for palliative care: Secondary | ICD-10-CM | POA: Diagnosis not present

## 2019-11-01 DIAGNOSIS — J9601 Acute respiratory failure with hypoxia: Secondary | ICD-10-CM | POA: Diagnosis not present

## 2019-11-01 DIAGNOSIS — G40901 Epilepsy, unspecified, not intractable, with status epilepticus: Secondary | ICD-10-CM | POA: Diagnosis not present

## 2019-11-01 LAB — HEPATIC FUNCTION PANEL
ALT: 20 U/L (ref 0–44)
AST: 44 U/L — ABNORMAL HIGH (ref 15–41)
Albumin: 2.4 g/dL — ABNORMAL LOW (ref 3.5–5.0)
Alkaline Phosphatase: 129 U/L — ABNORMAL HIGH (ref 38–126)
Bilirubin, Direct: 0.5 mg/dL — ABNORMAL HIGH (ref 0.0–0.2)
Indirect Bilirubin: 0.3 mg/dL (ref 0.3–0.9)
Total Bilirubin: 0.8 mg/dL (ref 0.3–1.2)
Total Protein: 6.7 g/dL (ref 6.5–8.1)

## 2019-11-01 LAB — CBC
HCT: 31.2 % — ABNORMAL LOW (ref 36.0–46.0)
HCT: 29.3 % — ABNORMAL LOW (ref 36.0–46.0)
Hemoglobin: 10.6 g/dL — ABNORMAL LOW (ref 12.0–15.0)
Hemoglobin: 10 g/dL — ABNORMAL LOW (ref 12.0–15.0)
MCH: 29.8 pg (ref 26.0–34.0)
MCH: 29.8 pg (ref 26.0–34.0)
MCHC: 34 g/dL (ref 30.0–36.0)
MCHC: 34.1 g/dL (ref 30.0–36.0)
MCV: 87.6 fL (ref 80.0–100.0)
MCV: 87.2 fL (ref 80.0–100.0)
Platelets: 29 10*3/uL — CL (ref 150–400)
Platelets: 31 10*3/uL — ABNORMAL LOW (ref 150–400)
RBC: 3.56 MIL/uL — ABNORMAL LOW (ref 3.87–5.11)
RBC: 3.36 MIL/uL — ABNORMAL LOW (ref 3.87–5.11)
RDW: 16.6 % — ABNORMAL HIGH (ref 11.5–15.5)
RDW: 17.4 % — ABNORMAL HIGH (ref 11.5–15.5)
WBC: 10.9 10*3/uL — ABNORMAL HIGH (ref 4.0–10.5)
WBC: 13.1 10*3/uL — ABNORMAL HIGH (ref 4.0–10.5)
nRBC: 0 % (ref 0.0–0.2)
nRBC: 0 % (ref 0.0–0.2)

## 2019-11-01 LAB — POCT I-STAT 7, (LYTES, BLD GAS, ICA,H+H)
Acid-Base Excess: 2 mmol/L (ref 0.0–2.0)
Acid-Base Excess: 3 mmol/L — ABNORMAL HIGH (ref 0.0–2.0)
Bicarbonate: 26.3 mmol/L (ref 20.0–28.0)
Bicarbonate: 28.2 mmol/L — ABNORMAL HIGH (ref 20.0–28.0)
Calcium, Ion: 1.15 mmol/L (ref 1.15–1.40)
Calcium, Ion: 1.19 mmol/L (ref 1.15–1.40)
HCT: 29 % — ABNORMAL LOW (ref 36.0–46.0)
HCT: 31 % — ABNORMAL LOW (ref 36.0–46.0)
Hemoglobin: 9.9 g/dL — ABNORMAL LOW (ref 12.0–15.0)
Hemoglobin: 10.5 g/dL — ABNORMAL LOW (ref 12.0–15.0)
O2 Saturation: 99 %
O2 Saturation: 99 %
Patient temperature: 37.2
Patient temperature: 37
Potassium: 3.9 mmol/L (ref 3.5–5.1)
Potassium: 4.5 mmol/L (ref 3.5–5.1)
Sodium: 138 mmol/L (ref 135–145)
Sodium: 136 mmol/L (ref 135–145)
TCO2: 27 mmol/L (ref 22–32)
TCO2: 30 mmol/L (ref 22–32)
pCO2 arterial: 39 mmHg (ref 32.0–48.0)
pCO2 arterial: 44.2 mmHg (ref 32.0–48.0)
pH, Arterial: 7.437 (ref 7.350–7.450)
pH, Arterial: 7.413 (ref 7.350–7.450)
pO2, Arterial: 143 mmHg — ABNORMAL HIGH (ref 83.0–108.0)
pO2, Arterial: 134 mmHg — ABNORMAL HIGH (ref 83.0–108.0)

## 2019-11-01 LAB — RENAL FUNCTION PANEL
Albumin: 2.4 g/dL — ABNORMAL LOW (ref 3.5–5.0)
Albumin: 2.2 g/dL — ABNORMAL LOW (ref 3.5–5.0)
Anion gap: 7 (ref 5–15)
Anion gap: 8 (ref 5–15)
BUN: 37 mg/dL — ABNORMAL HIGH (ref 6–20)
BUN: 39 mg/dL — ABNORMAL HIGH (ref 6–20)
CO2: 26 mmol/L (ref 22–32)
CO2: 25 mmol/L (ref 22–32)
Calcium: 8.2 mg/dL — ABNORMAL LOW (ref 8.9–10.3)
Calcium: 8 mg/dL — ABNORMAL LOW (ref 8.9–10.3)
Chloride: 100 mmol/L (ref 98–111)
Chloride: 101 mmol/L (ref 98–111)
Creatinine, Ser: 1.63 mg/dL — ABNORMAL HIGH (ref 0.44–1.00)
Creatinine, Ser: 1.56 mg/dL — ABNORMAL HIGH (ref 0.44–1.00)
GFR calc Af Amer: 43 mL/min — ABNORMAL LOW (ref >60)
GFR calc Af Amer: 46 mL/min — ABNORMAL LOW (ref >60)
GFR calc non Af Amer: 37 mL/min — ABNORMAL LOW (ref >60)
GFR calc non Af Amer: 39 mL/min — ABNORMAL LOW (ref >60)
Glucose, Bld: 113 mg/dL — ABNORMAL HIGH (ref 70–99)
Glucose, Bld: 150 mg/dL — ABNORMAL HIGH (ref 70–99)
Phosphorus: 2.8 mg/dL (ref 2.5–4.6)
Phosphorus: 2.5 mg/dL (ref 2.5–4.6)
Potassium: 4.5 mmol/L (ref 3.5–5.1)
Potassium: 4.4 mmol/L (ref 3.5–5.1)
Sodium: 133 mmol/L — ABNORMAL LOW (ref 135–145)
Sodium: 134 mmol/L — ABNORMAL LOW (ref 135–145)

## 2019-11-01 LAB — COOXEMETRY PANEL
Carboxyhemoglobin: 1.8 % — ABNORMAL HIGH (ref 0.5–1.5)
Methemoglobin: 1.9 % — ABNORMAL HIGH (ref 0.0–1.5)
O2 Saturation: 66 %
Total hemoglobin: 10.1 g/dL — ABNORMAL LOW (ref 12.0–16.0)

## 2019-11-01 LAB — MAGNESIUM: Magnesium: 2.4 mg/dL (ref 1.7–2.4)

## 2019-11-01 LAB — GLUCOSE, CAPILLARY
Glucose-Capillary: 104 mg/dL — ABNORMAL HIGH (ref 70–99)
Glucose-Capillary: 86 mg/dL (ref 70–99)
Glucose-Capillary: 90 mg/dL (ref 70–99)
Glucose-Capillary: 149 mg/dL — ABNORMAL HIGH (ref 70–99)
Glucose-Capillary: 106 mg/dL — ABNORMAL HIGH (ref 70–99)

## 2019-11-01 LAB — PROTIME-INR
INR: 1.2 (ref 0.8–1.2)
Prothrombin Time: 15 seconds (ref 11.4–15.2)

## 2019-11-01 NOTE — Progress Notes (Signed)
Patient ID: [name], female   DOB: 09/29/1970, 49 y.o.   MRN: [id number] P    Advanced Heart Failure Rounding Note  PCP-Cardiologist: No primary care provider on file.   Subjective:    Events: - 4/7 Underwent mini TV repair c/b diffuse coagulopathy and severe bleeding.  Received RBCS, PLTs, TXA, cryo, Factor 7, steroids - 4/7 take back to OR for bleeding and [facility] ECMO placement  - 4/8 Repeat take back to OR for washout - 4/9 Back to OR for right chest hematoma removal. TEE in OR showed EF 40-45%, mild-moderately decreased RV systolic function, s/p TV repair with mild TR.  - 4/10 EEG with status epilepticus. Head CT negative.  - 4/12 ECMO decannulated and chest closed. Atrial fibrillation in OR with epinephrine.  - 4/15 IVIG started for thrombocytopenia. CT head with artifact but possible asymmetric hypodensity in right cerebellar hemisphere that could reflect hypoxic/ischemic injury.   Remains on vent. Now off Versed and Propofol since 4/15 and on phenobarbital IV for seizures.  Remains unresponsive.   Per neurology consultant's exam, has only cranial nerve reflexes off sedation.   On milrinone 0.125, off NE, NO being weaned.  On CVVH, currently pulling around -200 cc/hr. She was negative 5L yesterday.  Weight down 10 pounds to 184 (pre-op 156). CVP 15 and co-ox 66%. CI 3.0 from FloTrac.   Tmax 99.1  WBC 13.2k. Off abx   Hgb 10.0 this am. PLTs  83 => 63 => 37 => 30 => 29 => 31k.  She is getting IVIG.  No overt bleeding, no anticoagulation   She is in atrial flutter rate around 120 with amiodarone 60 mg/hr.     Objective:   Weight Range: 83.7 kg Body mass index is 28.9 kg/m.   Vital Signs:   Temp:  [97.7 F (36.5 C)-99.1 F (37.3 C)] 99 F (37.2 C) (04/18 0945) Resp:  [16-41] 41 (04/18 0945) SpO2:  [91 %-100 %] 100 % (04/18 0945) Arterial Line BP: (105-141)/(61-86) 125/75 (04/18 0945) FiO2 (%):  [50 %-60 %] 50 % (04/18 0753) Weight:  [83.7 kg] 83.7 kg (04/18  0451) Last BM Date: 11/01/19(RT)  Weight change: Filed Weights   10/30/19 0500 10/31/19 0432 11/01/19 0451  Weight: 92.6 kg 88 kg 83.7 kg    Intake/Output:   Intake/Output Summary (Last 24 hours) at 11/01/2019 1015 Last data filed at 11/01/2019 0800 Gross per 24 hour  Intake 3001.03 ml  Output 7801 ml  Net -4799.97 ml      Physical Exam    General:  Intubated Unresponsive HEENT: normal  +ETT/cor-track Neck: supple. + JVD Carotids 2+ bilat; no bruits. No lymphadenopathy or thryomegaly appreciated. Cor: PMI nondisplaced. Regular tachy. +CTs. Sternum ok Lungs: coarse  Abdomen: soft, nontender, nondistended. No hepatosplenomegaly. No bruits or masses. Good bowel sounds. Extremities: no cyanosis, clubbing, rash, 1-2+ edema Neuro: Unresponsive on vent  Telemetry   AFL  rate around 120 (personally reviewed).    Labs    CBC Recent Labs    10/31/19 0418 10/31/19 1547 11/01/19 0304 11/01/19 0415  WBC 10.9*  --  13.1*  --   HGB 10.6*  13.6   < > 10.0* 9.9*  HCT 31.2*  40.0   < > 29.3* 29.0*  MCV 87.6  --  87.2  --   PLT 29*  --  31*  --    < > = values in this interval not displayed.   Basic Metabolic Panel Recent Labs    10/31/19 0418  10/31/19 0432 10/31/19 1547 10/31/19 1547 11/01/19 0304 11/01/19 0415  NA 138   < > 133*  136   < > 133* 138  K 4.2   < > 4.2  4.2   < > 4.5 3.9  CL  --    < > 101  --  100  --   CO2  --    < > 25  --  26  --   GLUCOSE  --    < > 151*  --  113*  --   BUN  --    < > 39*  --  37*  --   CREATININE  --    < > 1.63*  --  1.63*  --   CALCIUM  --    < > 7.8*  --  8.2*  --   MG 2.5*  --   --   --  2.4  --   PHOS  --    < > 3.2  --  2.8  --    < > = values in this interval not displayed.   Liver Function Tests Recent Labs    10/31/19 0418 10/31/19 0432 10/31/19 1547 11/01/19 0304  AST 44*  --   --  44*  ALT 22  --   --  20  ALKPHOS 133*  --   --  129*  BILITOT 1.0  --   --  0.8  PROT 6.4*  --   --  6.7  ALBUMIN 2.4*    < > 2.2* 2.4*  2.4*   < > = values in this interval not displayed.   No results for input(s): LIPASE, AMYLASE in the last 72 hours. Cardiac Enzymes No results for input(s): CKTOTAL, CKMB, CKMBINDEX, TROPONINI in the last 72 hours.  BNP: BNP (last 3 results) Recent Labs    04/15/19 1101  BNP 248.7*    ProBNP (last 3 results) No results for input(s): PROBNP in the last 8760 hours.   D-Dimer No results for input(s): DDIMER in the last 72 hours. Hemoglobin A1C No results for input(s): HGBA1C in the last 72 hours. Fasting Lipid Panel No results for input(s): CHOL, HDL, LDLCALC, TRIG, CHOLHDL, LDLDIRECT in the last 72 hours. Thyroid Function Tests No results for input(s): TSH, T4TOTAL, T3FREE, THYROIDAB in the last 72 hours.  Invalid input(s): FREET3  Other results:   Imaging    DG Chest Port 1 View  Result Date: 11/01/2019 CLINICAL DATA:  Acute respiratory failure with hypoxia. EXAM: PORTABLE CHEST 1 VIEW COMPARISON:  October 31, 2019. FINDINGS: Stable cardiomediastinal silhouette. Endotracheal and nasogastric tubes are unchanged in position. Right internal jugular catheter is unchanged in position. Left lung is clear. Right-sided chest tube is unchanged in position. No definite pneumothorax is noted. Large right lung opacity is noted concerning for atelectasis or pneumonia with associated pleural effusion. Bony thorax is unremarkable. Status post cardiac valve repair. IMPRESSION: Stable support apparatus. Stable large right lung opacity is noted concerning for atelectasis or pneumonia with associated pleural effusion. No definite pneumothorax is noted. Electronically Signed   By: Marijo Conception M.D.   On: 11/01/2019 08:33     Medications:     Scheduled Medications: . B-complex with vitamin C  1 tablet Per Tube Daily  . bisacodyl  10 mg Oral Daily   Or  . bisacodyl  10 mg Rectal Daily  . chlorhexidine gluconate (MEDLINE KIT)  15 mL Mouth Rinse BID  . Chlorhexidine  Gluconate Cloth  6  each Topical Daily  . feeding supplement (PIVOT 1.5 CAL)  1,000 mL Per Tube Q24H  . insulin aspart  0-24 Units Subcutaneous Q4H  . levothyroxine  50 mcg Per Tube Q0600  . mouth rinse  15 mL Mouth Rinse 10 times per day  . pantoprazole (PROTONIX) IV  40 mg Intravenous QHS  . PHENObarbital  160 mg Intravenous BID  . sodium chloride flush  10-40 mL Intracatheter Q12H    Infusions: .  prismasol BGK 4/2.5 500 mL/hr at 11/01/19 0622  .  prismasol BGK 4/2.5 300 mL/hr at 11/01/19 0324  . sodium chloride Stopped (10/27/19 1308)  . sodium chloride Stopped (10/30/19 0957)  . amiodarone 60 mg/hr (11/01/19 0800)  . dextrose Stopped (10/30/19 0959)  . Immune Globulin 10% Stopped (10/31/19 1300)  . lacosamide (VIMPAT) IV 100 mg (11/01/19 1010)  . levETIRAcetam Stopped (10/31/19 2343)  . milrinone 0.125 mcg/kg/min (11/01/19 0800)  . norepinephrine (LEVOPHED) Adult infusion Stopped (10/29/19 1902)  . prismasol BGK 4/2.5 1,500 mL/hr at 11/01/19 0321  . propofol (DIPRIVAN) infusion Stopped (10/29/19 0415)    PRN Medications: sodium chloride, sodium chloride, acetaminophen (TYLENOL) oral liquid 160 mg/5 mL, artificial tears, dextrose, dextrose, diphenhydrAMINE, heparin, heparin, metoprolol tartrate, midazolam, morphine injection, ondansetron (ZOFRAN) IV, sodium chloride flush     Assessment/Plan   1. Post-cardiotomy hemorrhagic shock - Patient with refractory post-operative chest bleeding s/p minimally invasive TV repair on 11/05/2019 in setting of longstanding SLE - has received  RBCS, PLTs, TXA, cryo, Factor 7, steroids - ECMO started in OR - Repeat OR visit for right chest clot removal on 4/9 with improved hemodynamics, TEE with EF 40-45%, mild-moderately decreased RV function, stable replaced TV.  - ECMO decannulated 4/12.  - Stable now w/o overt bleeding. Hgb 10.0  2. CHF, primarily RV failure with cardiogenic shock - CVP 15 this morning with co-ox 66%, FloTrak CI 3.0.  Still volume overloaded. Weight still up 28 pounds from baseline.  - Continue milrinone 0.125, off norepinephrine now and MAP stable despite atrial flutter.   - Hemodynamics are currently stable. RV seems to be functioning reasonably well. Titrating off NO today per surgical team.  - Continue CVVH, pulling net 200 cc/hr and tolerating now. Renal following.  3. 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  - plan as above  4. Post-operative respiratory failure - remains intubated, FiO2 0.5.   - Progressive airspace disease in R lung after mini thoracotomy, back to OR 4/9 for right chest clot removal.  - CCM following vent. - Off abx now, afebrile with normal WBCs.  - CXR (4/18) with no change in large right lung opacity   5. Acute blood loss anemia - Now stable.   6. 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  - HIT negative - Plts 29K -> 31k  => Getting IVIG x 5 days.  - No overt bleeding, not anticoagulated. 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  - She is now on Vimpat and Keppra.  - With ongoing seizures, she is now off Propofol and Versed, started phenobarbital IV.  No further seizure activity.  - Head CT 4/15 with significant artifact but may show some anoxic injury.  Unable to get MRI due to retained wire.  - No awakening yet off sedation, now off since 4/15 hrs off sedation. Neurolog following. Only cranial nerve reflexes on their eval on 4/16 Concern for poor neurologic recovery.  Continue to limit sedation.   8. SLE - no change  9. AKI  - CVVH ongoing, pulling 200 cc/hr.  -Volume status improved but still over 25 pounds to go.  10. Nutrition - Tube feeds.  Well.  11. DVT prophylaxis - SCDs  12. Atrial fibrillation/flutter - Noted with epinephrine administration in OR 4/12.  - Atypical flutter on 4/13 but rate-controlled, then rapid atrial  fibrillation after that.  - She is now in atrial flutter at rates around 120.  Fortunately hemodynamics are stable. - Not on anticoagulation yet with thrombocytopenia.    - Continue amiodarone gtt 60.  No change.  13. Goals of care - Palliative care is following. - Family discussions ongoing with Dr. Roxy Manns. - We will continue to follow neuro exam closely  CRITICAL CARE Performed by: Glori Bickers  Total critical care time: 40 minutes  Critical care time was exclusive of separately billable procedures and treating other patients.  Critical care was necessary to treat or prevent imminent or life-threatening deterioration.  Critical care was time spent personally by me (independent of midlevel providers or residents) on the following activities: development of treatment plan with patient and/or surrogate as well as nursing, discussions with consultants, evaluation of patient's response to treatment, examination of patient, obtaining history from patient or surrogate, ordering and performing treatments and interventions, ordering and review of laboratory studies, ordering and review of radiographic studies, pulse oximetry and re-evaluation of patient's condition.    Length of Stay: [place], MD  11/01/2019, 10:15 AM  Advanced Heart Failure Team Pager [phone] (M-F; 7a - 4p)  Please contact [facility] for night-coverage after hours (4p -7a ) and weekends on [email or web address]

## 2019-11-01 NOTE — Progress Notes (Signed)
NAME:  [name], MRN:  [id number], DOB:  1971-07-16, LOS: 11 ADMISSION DATE:  10/19/2019, CONSULTATION DATE:  11/01/2019  REFERRING MD:  Allison Alberts, MD, CHIEF COMPLAINT:  Vent management.  History of present illness   The patient is a 49 year old woman who is postoperative day 10 from minimally invasive tricuspid valve repair which was then converted into an open procedure.  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  However despite echocardiograms showing severely elevated RA pressures, she has had 2 right heart cath that demonstrated normal peripheral vascular resistance for Group 1 pulmonary arterial hypertension.  She was taken to the OR by Dr. Roxy Porter on 4/7 given her persistent symptoms for repair of severe tricuspid regurgitation.  4/8, she had reexploration of her median sternotomy with concern for postoperative bleeding.  There was a large amount of clot evacuated from the anterior mediastinum and significant oozing diffusely.  Does not appear that there were any significant signs for mechanical active bleeding.  Bili at that time she was cannulated for ECMO and started on [place] ECMO on 4/8.  Since then she has had substantial correction of her coagulopathy including Kcentra, FEIBA, cryoprecipitate, FFP and copious blood products given her substantial bleeding in the mediastinum.  She went for decannulation from [facility] ECMO on 4/12. She remains critically ill with recurrent seizure like activity from a presumed anoxic brain injury.   PCCM is being consulted to assist with vent management.  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   Consults:  Heart failure, PCCM  Procedures:  4/7 tricuspid valve repair through median sternotomy 4/8 reexploration of the mediastinum with cannulation for ECMO 4/12 decannulation from [facility] ECMO  4/14: HD cath right IJ  Significant Diagnostic Tests:  Transesophageal echocardiogram January 2021  1. Left ventricular ejection fraction, by visual estimation, is 60 to  65%. The left ventricle has normal function. There is no left ventricular  hypertrophy.  2. The left ventricle has no regional wall motion abnormalities.  3. Global right ventricle has mildly reduced systolic function.The right  ventricular size is normal. No increase in right ventricular wall  thickness.  4. Left atrial size was moderately dilated.  5. Right atrial size was severely dilated.  6. The mitral valve is normal in structure. Trivial mitral valve  regurgitation.  7. The tricuspid valve is abnormal.  8. The tricuspid valve is abnormal. Tricuspid valve regurgitation is  severe.  9. Apparent mild restriction of septal leaflet. RVSP ~ 31mmHG.  10. The aortic valve is normal in structure. Aortic valve regurgitation is  not visualized.  11. The pulmonic valve was grossly normal. Pulmonic valve regurgitation is  not visualized.  12. Mild plaque invoving the descending aorta.  13. Severely elevated pulmonary artery systolic pressure.   Right heart catheterization June 2020 Findings:  RA = 5 RV = 34/7 PA =  34/6 (20) PCW = 8 Fick cardiac output/index = 5.9/3.1 PVR = 2.0 WU Ao sat = 100% PA sat = 71%, 72% SVC sat = 66%  Assessment: 1. Minimally elevated PA  pressures with normal PVR and cardiac output 2. No evidence of significant intracardiac shunt  CT Head  4/15: Evaluation limited by streak and beam hardening artifact arising from scalp monitoring leads.  Apparent asymmetric hypodensity within the right cerebellar hemisphere may reflect asymmetric beam hardening artifact. Edema related to infarct or hypoxic/ischemic injury cannot be excluded. Consider brain MRI for further evaluation, as clinically warranted.  Paranasal sinus mucosal thickening.  Large bilateral mastoid effusions. Associated left middle ear effusion.  Micro Data:  4/16: Respiratory cultures pending   Antimicrobials:  Levofloxacin 4/7 >> 4/8 Vancomycin 4/7 >> 4/12 Meropenem 4/9 >> 4/13  Subjective:  Off sedation. Still no meaningful neuro recovery.   Objective   Blood pressure 120/73, pulse (!) 104, temperature 98.8 F (37.1 C), resp. rate (!) 30, height 5\' 7"  (1.702 m), weight 83.7 kg, last menstrual period 04/02/2017, SpO2 100 %. CVP:  [12 mmHg-19 mmHg] 12 mmHg  Vent Mode: PRVC FiO2 (%):  [50 %-60 %] 50 % Set Rate:  [26 bmp] 26 bmp Vt Set:  [400 mL] 400 mL PEEP:  [5 cmH20-8 cmH20] 5 cmH20 Plateau Pressure:  [18 cmH20-20 cmH20] 18 cmH20   Intake/Output Summary (Last 24 hours) at 11/01/2019 9509 Last data filed at 11/01/2019 0800 Gross per 24 hour  Intake 3141.28 ml  Output 8167 ml  Net -5025.72 ml   Filed Weights   10/30/19 0500 10/31/19 0432 11/01/19 0451  Weight: 92.6 kg 88 kg 83.7 kg   Examination:  GEN: young fm, intubated, on mechanical support  HEENT: EET in place  CV: Tachycardic, RRR PULM: RRR, s1 s2 GI: soft, nt nd  EXT: BL dependent edema  NEURO: unresponsive to pain, no tracking   Resolved Hospital Problem list   None  Assessment & Plan:  [name] is a 49 y.o. woman with history of SLE with thrombocytopenia, nephritis, AVN and possible mild chronic RV failure who presented for repair of severe symptomatic tricuspid regurgitation on 4/7.    Acute hypoxemic respiratory failure, on mechanical ventilation  - remains on  full vent support  - mental status precludes liberation  - CXR reviewed - right sided opacity, atelectasis vs effusion, ct in place no ptx, The patient's images have been independently reviewed by me.    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  - Continue IVIG (Day 4/5)   Acute encephalopathy Status Epilepticus  - AEDS per neurology  - we appreciate neuro prognostication   WHO Group 1 PAH  - on iNO through vent  - volume management with cvvhd   GOC:  Per nursing cardiothoracic surgery has plans to meet with family on Monday.   Best practice:  Diet: TF with free water Pain/Anxiety/Delirium protocol (if indicated): Propofol (not running) VAP protocol (if indicated): Ordered DVT prophylaxis: heparin gtt GI prophylaxis: Pantoprazole Glucose control: Controlled, blood sugars less than 180 Foley required for critically ill state Mobility: Bedrest Code Status: Full code Family Communication: Per primary team Disposition: Needs ICU  This patient is critically ill with multiple organ system failure; which, requires frequent high complexity decision making, assessment, support, evaluation, and titration of therapies. This was completed through the application of advanced monitoring technologies and extensive interpretation of multiple databases. During this encounter critical care time was devoted to patient care services described in this note for 31 minutes.  Octavio Graves  Regine Christian, DO [facility] 11/01/2019 9:09 AM

## 2019-11-01 NOTE — Procedures (Signed)
Admit: 10/25/2019 LOS: 11  24F dialysis dependent AKI with baseline CKD from lupus nephritis; s/p minimmally invasive TV repair, hemorrhagic / cardiogenic shock postoperative, status epiletpicus, TCP on IVIG  Current CRRT Prescription: Catheter: R IJ  BFR: 200 Pre Blood Pump: 500 4K DFR: 1500 4K Replacement Rate: 300 4K Goal UF: 256m / h net neg Anticoagulation: None Clotting: about 1x/24h  S:  Stable in past 24h  K 3.9, P 2.8  On milrinone, off pressors  Anuric  Tol UF, 4.9L neg yesterday  O: 04/17 0701 - 04/18 0700 In: 3278 [I.V.:1284.8; NG/GT:1440; IV Piggyback:553.2] Out: 8208 [Urine:30; Stool:400; Chest Tube:160]  Filed Weights   10/30/19 0500 10/31/19 0432 11/01/19 0451  Weight: 92.6 kg 88 kg 83.7 kg    Recent Labs  Lab 10/31/19 0432 10/31/19 0432 10/31/19 1547 11/01/19 0304 11/01/19 0415  NA 135   < > 133*  136 133* 138  K 4.2   < > 4.2  4.2 4.5 3.9  CL 102  --  101 100  --   CO2 25  --  25 26  --   GLUCOSE 100*  --  151* 113*  --   BUN 39*  --  39* 37*  --   CREATININE 1.79*  --  1.63* 1.63*  --   CALCIUM 8.2*  --  7.8* 8.2*  --   PHOS 2.2*  --  3.2 2.8  --    < > = values in this interval not displayed.   Recent Labs  Lab 10/30/19 0427 10/30/19 0627 10/31/19 0418 10/31/19 0418 10/31/19 1547 11/01/19 0304 11/01/19 0415  WBC 9.9  --  10.9*  --   --  13.1*  --   HGB 11.0*   < > 10.6*  13.6   < > 11.2* 10.0* 9.9*  HCT 32.1*   < > 31.2*  40.0   < > 33.0* 29.3* 29.0*  MCV 86.5  --  87.6  --   --  87.2  --   PLT 30*  --  29*  --   --  31*  --    < > = values in this interval not displayed.    Scheduled Meds: . B-complex with vitamin C  1 tablet Per Tube Daily  . bisacodyl  10 mg Oral Daily   Or  . bisacodyl  10 mg Rectal Daily  . chlorhexidine gluconate (MEDLINE KIT)  15 mL Mouth Rinse BID  . Chlorhexidine Gluconate Cloth  6 each Topical Daily  . feeding supplement (PIVOT 1.5 CAL)  1,000 mL Per Tube Q24H  . insulin aspart  0-24  Units Subcutaneous Q4H  . levothyroxine  50 mcg Per Tube Q0600  . mouth rinse  15 mL Mouth Rinse 10 times per day  . pantoprazole (PROTONIX) IV  40 mg Intravenous QHS  . PHENObarbital  160 mg Intravenous BID  . sodium chloride flush  10-40 mL Intracatheter Q12H   Continuous Infusions: .  prismasol BGK 4/2.5 500 mL/hr at 11/01/19 0622  .  prismasol BGK 4/2.5 300 mL/hr at 11/01/19 0324  . sodium chloride Stopped (10/27/19 1308)  . sodium chloride Stopped (10/30/19 0957)  . amiodarone 60 mg/hr (11/01/19 0700)  . dextrose Stopped (10/30/19 0959)  . Immune Globulin 10% Stopped (10/31/19 1300)  . lacosamide (VIMPAT) IV Stopped (10/31/19 2205)  . levETIRAcetam Stopped (10/31/19 2343)  . milrinone 0.125 mcg/kg/min (11/01/19 0700)  . norepinephrine (LEVOPHED) Adult infusion Stopped (10/29/19 1902)  . prismasol BGK 4/2.5 1,500 mL/hr at  11/01/19 0321  . propofol (DIPRIVAN) infusion Stopped (10/29/19 0415)   PRN Meds:.sodium chloride, sodium chloride, acetaminophen (TYLENOL) oral liquid 160 mg/5 mL, artificial tears, dextrose, dextrose, diphenhydrAMINE, heparin, heparin, metoprolol tartrate, midazolam, morphine injection, ondansetron (ZOFRAN) IV, sodium chloride flush  ABG    Component Value Date/Time   PHART 7.437 11/01/2019 0415   PCO2ART 39.0 11/01/2019 0415   PO2ART 143.0 (H) 11/01/2019 0415   HCO3 26.3 11/01/2019 0415   TCO2 27 11/01/2019 0415   ACIDBASEDEF 1.0 10/28/2019 1841   O2SAT 99.0 11/01/2019 0415    A/P  1. 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 Shock, cardiogenic and hemorrhagic, on inotrope; significant RV failure 3. VDRF. Inhaled NO2 per CCM / AHF / TCTS 4. Status post minimally invasive tricuspid valve repair 10/20/2019 5. ABLA  6. 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 SLE 8. ITP, on IVIG  Cont CRRT current settings.    Rexene Agent , MD [facility]

## 2019-11-01 NOTE — Progress Notes (Signed)
CT surgery p.m. Rounds  Patient has stable day Net 2.2 L out with CVVH She remains with respiratory rate 40/min, probable neurogenic etiology.  Plan is to continue support and follow neurologic status although palliative care was asked to meet with family, apparently initiated by family request

## 2019-11-01 NOTE — Progress Notes (Addendum)
Daily Progress Note   Patient Name: [name]       Date: 11/01/2019 DOB: 1971-07-13  Age: 49 y.o. MRN#: [id number] Attending Physician: Rexene Alberts, MD Primary Care Physician: [name], MD Admit Date: 11/05/2019  Reason for Consultation/Follow-up: To discuss complex medical decision making related to patient's goals of care  Discussed with bedside RN and briefly with Dr. Prescott Gum.  Subjective: Met with patient's husband [name], brother [name] and Sister [name].  The three of them had visited [place] and were sad.  They were hopeful she would be showing more signs of recovery.    I explained that there is a team meeting tomorrow - Dr. Roxy Manns will communication with each of the specialties involved in her care, reassess and give recommendations about moving forward.    [name] expressed his concern over his children as well as Dawnelle's parents - what to tell them and how to prepare them?  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  I expressed concern that depending on the path taken her life could look very different. I encouraged them to hear her voice and keep her goals for quality of life in mind.    We talked about the successes - TV repair, coming off of ECMO, status epilepticus improved, ITP being successfully controlled.  We also talked about the concerns - primarily neurologic status and secondarily an inability to wean from the vent.  We discussed the possibility of trach / Peg / dialysis for longer term care.  I shared with them that facilities that provide that level of support are scarce and may be located out of state.  Family is curious about prognosis for neurologic  recovery.  They ask how much time (Days? Weeks? Months?) is appropriate to be able to assess her neurologic prognosis.  Assessment: Family still praying for the best but attempting to prepare for all outcomes.  Patient on CRRT, poor neurologic exam off sedation, requiring increased vent support.   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 She made another trip back to the OR for evacuation of hemothorax. She subsequently developed status epilepticus. After treatment with dual antiseizure medications and sedation the status stopped and she has been having intermittent seizing episodes since. There is some concern for a degree of anoxic brain injury. She is currently slated for ECMO decannulation on 4/12 am.   Length of Stay: 11   Vital Signs: BP 120/73   Pulse (!) 104   Temp 99 F (37.2 C)   Resp (!) 38   Ht '5\' 7"'$  (1.702 m)   Wt 83.7 kg   LMP 04/02/2017   SpO2 100%   BMI 28.90 kg/m  SpO2: SpO2: 100 % O2 Device: O2 Device: Ventilator O2 Flow Rate: O2 Flow Rate (L/min): 40 L/min       Palliative Assessment/Data: 10%     Palliative Care Plan    Recommendations/Plan:  PMT will continue to support medical team.  Please let me know how best to coordinate with you.  Family prepared for update on 4/19.  Will request [facility] referral for [name] and patient's children  Discussed Robert's request for advice / support of his children with Chaplain and they will reach out to him.  Code Status:  Full code (not discussed)  Prognosis:   Unable to determine.  Unfortunately she is at high risk for acute  decline / death.  Ultimately it is likely she will not have meaningful functional recovery.   Discharge Planning:  To Be Determined  Care plan was discussed with Family, RN, Dr. Prescott Gum  Thank you for allowing the Palliative Medicine Team to assist in the care of this patient.  Total time spent:  65 min.     Greater than 50%  of this time was spent counseling and coordinating care related to the above assessment and plan.  Florentina Jenny, PA-C Palliative Medicine  Please contact Palliative MedicineTeam phone at [phone] for questions and concerns between 7 am - 7 pm.   Please see AMION for individual provider pager numbers.

## 2019-11-01 NOTE — Progress Notes (Signed)
Daily Progress Note   Patient Name: [name]       Date: 11/01/2019 DOB: [birth year]  Age: 49 y.o. MRN#: [id number] Attending Physician: Rexene Alberts, MD Primary Care Physician: [name], MD Admit Date: 11/05/2019  Reason for Consultation/Follow-up: To discuss complex medical decision making related to patient's goals of care  Reviewed chart.  Examined patient at bedside.  Discussed with RN.  My understanding from the RN is that there will be an interdisciplinary meeting on Monday to reassess Ms. Bohlin's status as a team and consider recommendations about where to go from here.  Subjective: Spoke with Jeneen Rinks on the phone to check in see how he was doing and maintain our contact.  Jeneen Rinks stated that Harrie Jeans (patient's brother) was coming into town Sunday and he asked if we could meet.  We scheduled a meeting at 4:00 pm 4/18 to touch base and answer questions.  Assessment:   Since last weekend she has been successfully taken off of ECMO.  She remains intubated unable to wean thus far.  She remains on hemodialysis (CRRT). 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  Neurology is controlling her seizures unfortunately the patient only has cranial nerve reflexes.   Patient Profile/HPI:  49 y.o. 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   In the OR she developed severe bleeding and hemorrhagic shock.  She subsequently was placed on ECMO.  She made another trip back to the OR for evacuation of hemothorax.  She subsequently developed  status epilepticus.  After treatment with dual antiseizure medications and sedation the status stopped and she has been having intermittent seizing episodes since.  There is some concern for a degree of anoxic brain injury.  She is currently slated for ECMO decannulation on 4/12 am.  Length of Stay: 11   Vital Signs: BP 120/73   Pulse (!) 104   Temp 99 F (37.2 C)   Resp (!) 38   Ht 5\' 7"  (1.702 m)   Wt 83.7 kg   LMP 04/02/2017   SpO2 93%   BMI 28.90 kg/m  SpO2: SpO2: 93 % O2 Device: O2 Device: Ventilator O2 Flow Rate: O2 Flow Rate (L/min): 40 L/min       Palliative Assessment/Data: 10%     Palliative Care Plan    Recommendations/Plan:  PMT will meet with husband and brother at 4:00 on 4/18 to answer any questions and provide support.   I am here on Monday and will be available to support the interdisciplinary meeting or will await the results of it to determine how PMT can best support this patient and family going forward.  Code Status:  Full code  Prognosis:  Very concerning for a significant functional recovery.  Discharge Planning:  To Be Determined   Thank you for allowing the Palliative Medicine Team to assist in the care of this patient.  Total time spent:  25 min     Greater than 50%  of this time was spent counseling and coordinating care related to the above assessment and plan.  Florentina Jenny, PA-C Palliative Medicine  Please contact Palliative MedicineTeam phone at [phone] for questions and concerns between 7 am - 7 pm.   Please see AMION for individual provider pager numbers.

## 2019-11-02 ENCOUNTER — Inpatient Hospital Stay (HOSPITAL_COMMUNITY): Payer: BC Managed Care – PPO

## 2019-11-02 DIAGNOSIS — R569 Unspecified convulsions: Secondary | ICD-10-CM | POA: Diagnosis not present

## 2019-11-02 DIAGNOSIS — Z66 Do not resuscitate: Secondary | ICD-10-CM | POA: Diagnosis not present

## 2019-11-02 DIAGNOSIS — I50813 Acute on chronic right heart failure: Secondary | ICD-10-CM | POA: Diagnosis not present

## 2019-11-02 DIAGNOSIS — M329 Systemic lupus erythematosus, unspecified: Secondary | ICD-10-CM | POA: Diagnosis not present

## 2019-11-02 DIAGNOSIS — J9601 Acute respiratory failure with hypoxia: Secondary | ICD-10-CM | POA: Diagnosis not present

## 2019-11-02 DIAGNOSIS — R092 Respiratory arrest: Secondary | ICD-10-CM | POA: Diagnosis not present

## 2019-11-02 DIAGNOSIS — Z515 Encounter for palliative care: Secondary | ICD-10-CM | POA: Diagnosis not present

## 2019-11-02 LAB — POCT I-STAT 7, (LYTES, BLD GAS, ICA,H+H)
Acid-Base Excess: 3 mmol/L — ABNORMAL HIGH (ref 0.0–2.0)
Bicarbonate: 25.2 mmol/L (ref 20.0–28.0)
Bicarbonate: 27.7 mmol/L (ref 20.0–28.0)
Calcium, Ion: 1.15 mmol/L (ref 1.15–1.40)
Calcium, Ion: 1.2 mmol/L (ref 1.15–1.40)
HCT: 30 % — ABNORMAL LOW (ref 36.0–46.0)
HCT: 30 % — ABNORMAL LOW (ref 36.0–46.0)
Hemoglobin: 10.2 g/dL — ABNORMAL LOW (ref 12.0–15.0)
Hemoglobin: 10.2 g/dL — ABNORMAL LOW (ref 12.0–15.0)
O2 Saturation: 98 %
O2 Saturation: 99 %
Patient temperature: 36.8
Patient temperature: 36.2
Potassium: 4 mmol/L (ref 3.5–5.1)
Potassium: 4.7 mmol/L (ref 3.5–5.1)
Sodium: 141 mmol/L (ref 135–145)
Sodium: 135 mmol/L (ref 135–145)
TCO2: 26 mmol/L (ref 22–32)
TCO2: 29 mmol/L (ref 22–32)
pCO2 arterial: 42 mmHg (ref 32.0–48.0)
pCO2 arterial: 42.6 mmHg (ref 32.0–48.0)
pH, Arterial: 7.384 (ref 7.350–7.450)
pH, Arterial: 7.418 (ref 7.350–7.450)
pO2, Arterial: 105 mmHg (ref 83.0–108.0)
pO2, Arterial: 125 mmHg — ABNORMAL HIGH (ref 83.0–108.0)

## 2019-11-02 LAB — RENAL FUNCTION PANEL
Albumin: 2.4 g/dL — ABNORMAL LOW (ref 3.5–5.0)
Albumin: 2.1 g/dL — ABNORMAL LOW (ref 3.5–5.0)
Anion gap: 10 (ref 5–15)
Anion gap: 5 (ref 5–15)
BUN: 39 mg/dL — ABNORMAL HIGH (ref 6–20)
BUN: 41 mg/dL — ABNORMAL HIGH (ref 6–20)
CO2: 25 mmol/L (ref 22–32)
CO2: 27 mmol/L (ref 22–32)
Calcium: 8.5 mg/dL — ABNORMAL LOW (ref 8.9–10.3)
Calcium: 7.8 mg/dL — ABNORMAL LOW (ref 8.9–10.3)
Chloride: 99 mmol/L (ref 98–111)
Chloride: 100 mmol/L (ref 98–111)
Creatinine, Ser: 1.61 mg/dL — ABNORMAL HIGH (ref 0.44–1.00)
Creatinine, Ser: 1.6 mg/dL — ABNORMAL HIGH (ref 0.44–1.00)
GFR calc Af Amer: 44 mL/min — ABNORMAL LOW (ref >60)
GFR calc Af Amer: 44 mL/min — ABNORMAL LOW (ref >60)
GFR calc non Af Amer: 38 mL/min — ABNORMAL LOW (ref >60)
GFR calc non Af Amer: 38 mL/min — ABNORMAL LOW (ref >60)
Glucose, Bld: 93 mg/dL (ref 70–99)
Glucose, Bld: 160 mg/dL — ABNORMAL HIGH (ref 70–99)
Phosphorus: 2.6 mg/dL (ref 2.5–4.6)
Phosphorus: 2.1 mg/dL — ABNORMAL LOW (ref 2.5–4.6)
Potassium: 4.8 mmol/L (ref 3.5–5.1)
Potassium: 4.6 mmol/L (ref 3.5–5.1)
Sodium: 134 mmol/L — ABNORMAL LOW (ref 135–145)
Sodium: 132 mmol/L — ABNORMAL LOW (ref 135–145)

## 2019-11-02 LAB — GLUCOSE, CAPILLARY
Glucose-Capillary: 109 mg/dL — ABNORMAL HIGH (ref 70–99)
Glucose-Capillary: 132 mg/dL — ABNORMAL HIGH (ref 70–99)
Glucose-Capillary: 159 mg/dL — ABNORMAL HIGH (ref 70–99)
Glucose-Capillary: 206 mg/dL — ABNORMAL HIGH (ref 70–99)
Glucose-Capillary: 49 mg/dL — ABNORMAL LOW (ref 70–99)
Glucose-Capillary: 73 mg/dL (ref 70–99)
Glucose-Capillary: 90 mg/dL (ref 70–99)
Glucose-Capillary: 94 mg/dL (ref 70–99)

## 2019-11-02 LAB — CBC
HCT: 30.3 % — ABNORMAL LOW (ref 36.0–46.0)
Hemoglobin: 10.3 g/dL — ABNORMAL LOW (ref 12.0–15.0)
MCH: 29.6 pg (ref 26.0–34.0)
MCHC: 34 g/dL (ref 30.0–36.0)
MCV: 87.1 fL (ref 80.0–100.0)
Platelets: 44 10*3/uL — ABNORMAL LOW (ref 150–400)
RBC: 3.48 MIL/uL — ABNORMAL LOW (ref 3.87–5.11)
RDW: 17.9 % — ABNORMAL HIGH (ref 11.5–15.5)
WBC: 16.3 10*3/uL — ABNORMAL HIGH (ref 4.0–10.5)
nRBC: 0 % (ref 0.0–0.2)

## 2019-11-02 LAB — COOXEMETRY PANEL
Carboxyhemoglobin: 2 % — ABNORMAL HIGH (ref 0.5–1.5)
Methemoglobin: 1.5 % (ref 0.0–1.5)
O2 Saturation: 69 %
Total hemoglobin: 10.6 g/dL — ABNORMAL LOW (ref 12.0–16.0)

## 2019-11-02 LAB — PROTIME-INR
INR: 1.2 (ref 0.8–1.2)
Prothrombin Time: 15.1 seconds (ref 11.4–15.2)

## 2019-11-02 LAB — CULTURE, RESPIRATORY W GRAM STAIN: Special Requests: NORMAL

## 2019-11-02 LAB — HEPATIC FUNCTION PANEL
ALT: 21 U/L (ref 0–44)
AST: 55 U/L — ABNORMAL HIGH (ref 15–41)
Albumin: 2.4 g/dL — ABNORMAL LOW (ref 3.5–5.0)
Alkaline Phosphatase: 170 U/L — ABNORMAL HIGH (ref 38–126)
Bilirubin, Direct: 0.6 mg/dL — ABNORMAL HIGH (ref 0.0–0.2)
Indirect Bilirubin: 0.4 mg/dL (ref 0.3–0.9)
Total Bilirubin: 1 mg/dL (ref 0.3–1.2)
Total Protein: 7.4 g/dL (ref 6.5–8.1)

## 2019-11-02 LAB — MAGNESIUM: Magnesium: 2.6 mg/dL — ABNORMAL HIGH (ref 1.7–2.4)

## 2019-11-02 MED ORDER — PIVOT 1.5 CAL PO LIQD
1000.0000 mL | ORAL | Status: DC
  Administered 2019-11-02 – 2019-11-07 (×7): 1000 mL
  Filled 2019-11-02 (×7): qty 1000

## 2019-11-02 MED ORDER — HYDROMORPHONE HCL 1 MG/ML IJ SOLN
0.2500 mg | INTRAMUSCULAR | Status: DC | PRN
  Administered 2019-11-02: 0.5 mg via INTRAVENOUS
  Administered 2019-11-02 (×2): 0.25 mg via INTRAVENOUS
  Administered 2019-11-03: 0.5 mg via INTRAVENOUS
  Administered 2019-11-03 (×2): 0.25 mg via INTRAVENOUS
  Administered 2019-11-05: 0.5 mg via INTRAVENOUS
  Filled 2019-11-02 (×7): qty 1

## 2019-11-02 MED ORDER — DEXTROSE 50 % IV SOLN
50.0000 mL | Freq: Once | INTRAVENOUS | Status: AC
  Administered 2019-11-02: 50 mL via INTRAVENOUS

## 2019-11-02 MED ORDER — LORAZEPAM 2 MG/ML IJ SOLN
2.0000 mg | INTRAMUSCULAR | Status: DC | PRN
  Administered 2019-11-10: 14:00:00 2 mg via INTRAVENOUS
  Filled 2019-11-02: qty 1

## 2019-11-02 MED ORDER — PERFLUTREN LIPID MICROSPHERE
INTRAVENOUS | Status: AC
  Filled 2019-11-02: qty 10

## 2019-11-02 NOTE — Progress Notes (Signed)
Patient ID: [name], female   DOB: May 24, 1971, 49 y.o.   MRN: [id number] P    Advanced Heart Failure Rounding Note  PCP-Cardiologist: No primary care provider on file.   Subjective:    Events: - 4/7 Underwent mini TV repair c/b diffuse coagulopathy and severe bleeding.  Received RBCS, PLTs, TXA, cryo, Factor 7, steroids - 4/7 take back to OR for bleeding and [facility] ECMO placement  - 4/8 Repeat take back to OR for washout - 4/9 Back to OR for right chest hematoma removal. TEE in OR showed EF 40-45%, mild-moderately decreased RV systolic function, s/p TV repair with mild TR.  - 4/10 EEG with status epilepticus. Head CT negative.  - 4/12 ECMO decannulated and chest closed. Atrial fibrillation in OR with epinephrine.  - 4/15 IVIG started for thrombocytopenia. CT head with artifact but possible asymmetric hypodensity in right cerebellar hemisphere that could reflect hypoxic/ischemic injury.   Remains on vent. Now off Versed and Propofol since 4/15 and on phenobarbital IV for seizures.  Remains unresponsive.   Per neurology consultant's exam, has only cranial nerve reflexes off sedation.   On milrinone 0.125, off NE, NO being weaned.  On CVVH, currently pulling around -200 cc/hr. She was negative another 5L yesterday.  Weight down 11 pounds to 173 (pre-op 156). CVP 9-10 and co-ox 69%. CI 2.4  from FloTrac. Attempted to wean iNO last night but CO dropped and CVP went to 20 so held at 6ppm. Sugars low and corrected with 1 ampD50  Tmax 99.3  WBC 13.2k -> 16.3. Off abx  Sputum culture 4/17 negative  Hgb 10.3 this am. PLTs  83 => 63 => 37 => 30 => 29 => 31k => 44k.  She is getting IVIG - today is day 5/5.  No overt bleeding, no anticoagulation   She is in atrial flutter rate around 110-120 with amiodarone 60 mg/hr.     Objective:   Weight Range: 78.6 kg Body mass index is 27.14 kg/m.   Vital Signs:   Temp:  [97.7 F (36.5 C)-99.3 F (37.4 C)] 97.7 F (36.5 C) (04/19 0700) Resp:   [18-41] 34 (04/19 0700) SpO2:  [95 %-100 %] 100 % (04/19 0700) Arterial Line BP: (98-137)/(58-80) 101/63 (04/19 0700) FiO2 (%):  [50 %] 50 % (04/19 0400) Weight:  [78.6 kg] 78.6 kg (04/19 0500) Last BM Date: 11/01/19  Weight change: Filed Weights   10/31/19 0432 11/01/19 0451 11/02/19 0500  Weight: 88 kg 83.7 kg 78.6 kg    Intake/Output:   Intake/Output Summary (Last 24 hours) at 11/02/2019 0724 Last data filed at 11/02/2019 0700 Gross per 24 hour  Intake 3143.14 ml  Output 8001 ml  Net -4857.86 ml      Physical Exam    General:  Intubated Unresponsive HEENT: normal + cortrack + ETT Neck: supple.CVP 10 Carotids 2+ bilat; no bruits. No lymphadenopathy or thryomegaly appreciated. Cor: PMI nondisplaced. Regula tachy,. Sternal incisions ok  Lungs: clear Abdomen: soft, nontender, nondistended. No hepatosplenomegaly. No bruits or masses. Good bowel sounds. Extremities: no cyanosis, clubbing, rash, 1+ edema Neuro: unresponsive   Telemetry   AFL  rate 110-120 (personally reviewed).    Labs    CBC Recent Labs    11/01/19 0304 11/01/19 0415 11/02/19 0500 11/02/19 0512  WBC 13.1*  --  16.3*  --   HGB 10.0*   < > 10.3* 10.2*  HCT 29.3*   < > 30.3* 30.0*  MCV 87.2  --  87.1  --  PLT 31*  --  44*  --    < > = values in this interval not displayed.   Basic Metabolic Panel Recent Labs    11/01/19 0304 11/01/19 0415 11/01/19 1540 11/01/19 1540 11/02/19 0500 11/02/19 0512  NA 133*   < > 134*  136   < > 134* 141  K 4.5   < > 4.4  4.5   < > 4.8 4.0  CL 100   < > 101  --  99  --   CO2 26   < > 25  --  25  --   GLUCOSE 113*   < > 150*  --  93  --   BUN 37*   < > 39*  --  39*  --   CREATININE 1.63*   < > 1.56*  --  1.61*  --   CALCIUM 8.2*   < > 8.0*  --  8.5*  --   MG 2.4  --   --   --  2.6*  --   PHOS 2.8   < > 2.5  --  2.6  --    < > = values in this interval not displayed.   Liver Function Tests Recent Labs    11/01/19 0304 11/01/19 0304 11/01/19 1540  11/02/19 0500  AST 44*  --   --  55*  ALT 20  --   --  21  ALKPHOS 129*  --   --  170*  BILITOT 0.8  --   --  1.0  PROT 6.7  --   --  7.4  ALBUMIN 2.4*  2.4*   < > 2.2* 2.4*  2.4*   < > = values in this interval not displayed.   No results for input(s): LIPASE, AMYLASE in the last 72 hours. Cardiac Enzymes No results for input(s): CKTOTAL, CKMB, CKMBINDEX, TROPONINI in the last 72 hours.  BNP: BNP (last 3 results) Recent Labs    04/15/19 1101  BNP 248.7*    ProBNP (last 3 results) No results for input(s): PROBNP in the last 8760 hours.   D-Dimer No results for input(s): DDIMER in the last 72 hours. Hemoglobin A1C No results for input(s): HGBA1C in the last 72 hours. Fasting Lipid Panel No results for input(s): CHOL, HDL, LDLCALC, TRIG, CHOLHDL, LDLDIRECT in the last 72 hours. Thyroid Function Tests No results for input(s): TSH, T4TOTAL, T3FREE, THYROIDAB in the last 72 hours.  Invalid input(s): FREET3  Other results:   Imaging    No results found.   Medications:     Scheduled Medications: . B-complex with vitamin C  1 tablet Per Tube Daily  . bisacodyl  10 mg Oral Daily   Or  . bisacodyl  10 mg Rectal Daily  . chlorhexidine gluconate (MEDLINE KIT)  15 mL Mouth Rinse BID  . Chlorhexidine Gluconate Cloth  6 each Topical Daily  . feeding supplement (PIVOT 1.5 CAL)  1,000 mL Per Tube Q24H  . insulin aspart  0-24 Units Subcutaneous Q4H  . levothyroxine  50 mcg Per Tube Q0600  . mouth rinse  15 mL Mouth Rinse 10 times per day  . pantoprazole (PROTONIX) IV  40 mg Intravenous QHS  . PHENObarbital  160 mg Intravenous BID  . sodium chloride flush  10-40 mL Intracatheter Q12H    Infusions: .  prismasol BGK 4/2.5 500 mL/hr at 11/02/19 0145  .  prismasol BGK 4/2.5 300 mL/hr at 11/01/19 1900  . sodium chloride Stopped (10/27/19 1308)  .  sodium chloride Stopped (10/30/19 0957)  . amiodarone 60 mg/hr (11/02/19 0700)  . dextrose Stopped (10/30/19 0959)  .  Immune Globulin 10% Stopped (11/01/19 1300)  . lacosamide (VIMPAT) IV Stopped (11/01/19 2151)  . levETIRAcetam Stopped (11/02/19 0126)  . milrinone 0.125 mcg/kg/min (11/02/19 0700)  . norepinephrine (LEVOPHED) Adult infusion Stopped (10/29/19 1902)  . prismasol BGK 4/2.5 1,500 mL/hr at 11/02/19 0205  . propofol (DIPRIVAN) infusion Stopped (10/29/19 0415)    PRN Medications: sodium chloride, sodium chloride, acetaminophen (TYLENOL) oral liquid 160 mg/5 mL, artificial tears, dextrose, dextrose, diphenhydrAMINE, heparin, heparin, metoprolol tartrate, midazolam, morphine injection, ondansetron (ZOFRAN) IV, sodium chloride flush     Assessment/Plan   1. Post-cardiotomy hemorrhagic shock - Patient with refractory post-operative chest bleeding s/p minimally invasive TV repair on 11/09/2019 in setting of longstanding SLE - has received  RBCS, PLTs, TXA, cryo, Factor 7, steroids - ECMO started in OR - Repeat OR visit for right chest clot removal on 4/9 with improved hemodynamics, TEE with EF 40-45%, mild-moderately decreased RV function, stable replaced TV.  - ECMO decannulated 4/12.  - Stable now w/o overt bleeding. Hgb 10.3  2. Acute systolic CHF, primarily RV failure with cardiogenic shock - CVP 9-10 this morning with co-ox 69%, FloTrak CI 2,9 - Weight down 45 pounds post-op peak. Still about 17 pounds from pre-op (156) - Continue to pull with CVVHD as hemodynamics tolerate. - Continue milrinone 0.125, off norepinephrine now and MAP stable despite atrial flutter.   - Hemodynamics are currently stable. RV seems to be functioning reasonably well. iNO weaned stalled yesterday at 6ppm. Will leave at that rate for now until more fluid off  3. 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  - plan as above  4. Post-operative respiratory failure - remains intubated, FiO2 0.5.   - Progressive airspace disease in R lung after mini thoracotomy, back to OR 4/9 for right  chest clot removal.  - CCM following vent. - Off abx now, afebrile with normal WBCs.  - CXR this am with no change in large right lung opacity - otherwise clear. Personally reviewed  5. Acute blood loss anemia - Now stable.   6. 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 Today is day 5/5 - No overt bleeding, not anticoagulated. 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  - She is now on Vimpat and Keppra.  - With ongoing seizures, she is now off Propofol and Versed, started phenobarbital IV.  No further seizure activity.  - Head CT 4/15 with significant artifact but may show some anoxic injury.  Unable to get MRI due to retained wire.  - No awakening yet off sedation, now off since 4/15 hrs off sedation. Neurology following. Only cranial nerve reflexes on their eval on 4/16 Concern for poor neurologic recovery.  Continue to limit sedation.  - Neuro has not seen since 4/15. Will reach ou to them to reasess  8. SLE - no change  9. AKI  - CVVH ongoing, pulling 200 cc/hr.  -Volume status improved but still over 15 pounds to go.  10. Nutrition - Tube feeds.  Well.  11. DVT prophylaxis - SCDs  12. Atrial fibrillation/flutter - Noted with epinephrine administration in OR 4/12.  - Atypical flutter on 4/13 but rate-controlled, then rapid atrial fibrillation  after that.  - She is now in atrial flutter at rates around 110-120.  Fortunately hemodynamics are stable. - Not on anticoagulation yet with thrombocytopenia.    - Continue amiodarone gtt No change.  13. Goals of care - Palliative care is following. - Family discussions ongoing with Dr. Roxy Manns. - We will continue to follow neuro exam closely  CRITICAL CARE Performed by: Glori Bickers  Total critical care time: 35 minutes  Critical care time was  exclusive of separately billable procedures and treating other patients.  Critical care was necessary to treat or prevent imminent or life-threatening deterioration.  Critical care was time spent personally by me (independent of midlevel providers or residents) on the following activities: development of treatment plan with patient and/or surrogate as well as nursing, discussions with consultants, evaluation of patient's response to treatment, examination of patient, obtaining history from patient or surrogate, ordering and performing treatments and interventions, ordering and review of laboratory studies, ordering and review of radiographic studies, pulse oximetry and re-evaluation of patient's condition.    Length of Stay: [place], MD  11/02/2019, 7:24 AM  Advanced Heart Failure Team Pager [phone] (M-F; 7a - 4p)  Please contact [facility] for night-coverage after hours (4p -7a ) and weekends on [email or web address]

## 2019-11-02 NOTE — Progress Notes (Signed)
Subjective: No clinical seizure-like episodes overnight.  Met with family today  ROS: Unable to obtain due to poor mental status  Examination  Vital signs in last 24 hours: Temp:  [97 F (36.1 C)-99.1 F (37.3 C)] 97.2 F (36.2 C) (04/19 1345) Pulse Rate:  [116-117] 116 (04/19 1126) Resp:  [18-41] 34 (04/19 1345) SpO2:  [92 %-100 %] 100 % (04/19 1345) Arterial Line BP: (86-137)/(55-80) 116/73 (04/19 1345) FiO2 (%):  [50 %] 50 % (04/19 1126) Weight:  [78.6 kg] 78.6 kg (04/19 0500)  General: lying in bed, not in apparent distress CVS: pulse-normal rate and rhythm RS: breathing comfortably, intubated Extremities: normal, warm  Neuro: MS: Comatose, does not open eyes to noxious stimuli CN: pupils equal and reactive, corneal reflex intact, gag reflex intact  Motor: Withdraws to noxious stimuli in bilateral upper extremities and right lower extremity, no movement to noxious stimuli in left lower extremity   Basic Metabolic Panel: Recent Labs  Lab 10/29/19 0323 10/29/19 0346 10/30/19 0427 10/30/19 0627 10/30/19 1604 10/31/19 0418 10/31/19 0432 10/31/19 0432 10/31/19 1547 10/31/19 1547 11/01/19 0304 11/01/19 0415 11/01/19 1540 11/02/19 0500 11/02/19 0512  NA 136   < > 135   < >   < > 138 135   < > 133*  136   < > 133* 138 134*  136 134* 141  K 4.4   < > 4.2   < >   < > 4.2 4.2   < > 4.2  4.2   < > 4.5 3.9 4.4  4.5 4.8 4.0  CL 101   < > 101   < >   < >  --  102  --  101  --  100  --  101 99  --   CO2 23   < > 22   < >   < >  --  25  --  25  --  26  --  25 25  --   GLUCOSE 138*   < > 145*   < >   < >  --  100*  --  151*  --  113*  --  150* 93  --   BUN 42*   < > 39*   < >   < >  --  39*  --  39*  --  37*  --  39* 39*  --   CREATININE 2.39*   < > 1.92*   < >   < >  --  1.79*  --  1.63*  --  1.63*  --  1.56* 1.61*  --   CALCIUM 8.2*   < > 8.2*   < >   < >  --  8.2*   < > 7.8*   < > 8.2*  --  8.0* 8.5*  --   MG 2.4  --  2.4  --   --  2.5*  --   --   --   --  2.4  --   --   2.6*  --   PHOS 6.5*   < > 3.7   < >   < >  --  2.2*  --  3.2  --  2.8  --  2.5 2.6  --    < > = values in this interval not displayed.    CBC: Recent Labs  Lab 10/29/19 0323 10/29/19 0346 10/30/19 0427 10/30/19 0627 10/31/19 0418 10/31/19 1547 11/01/19 0304 11/01/19 0415 11/01/19 1540 11/02/19 0500 11/02/19  0512  WBC 9.5  --  9.9  --  10.9*  --  13.1*  --   --  16.3*  --   HGB 11.6*   < > 11.0*   < > 10.6*  13.6   < > 10.0* 9.9* 10.5* 10.3* 10.2*  HCT 34.7*   < > 32.1*   < > 31.2*  40.0   < > 29.3* 29.0* 31.0* 30.3* 30.0*  MCV 87.8  --  86.5  --  87.6  --  87.2  --   --  87.1  --   PLT 37*  --  30*  --  29*  --  31*  --   --  44*  --    < > = values in this interval not displayed.    Coagulation Studies: Recent Labs    10/31/19 0418 11/01/19 0304 11/02/19 0500  LABPROT 13.9 15.0 15.1  INR 1.1 1.2 1.2    Imaging No new brain imaging overnight.  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 However patient continues to be comatose, still does not open eyes to noxious stimuli.  Recommendations - Continue Keppra '1000mg'$  BID,Vimpat '100mg'$  BIDand phenobarbital 160 mg twice daily -Had a family meeting with husband and 2 other family members along with ICU team and positive care team.  Discussed that patient continues to be comatose after being off sedation for over 4 days.   She also had status epilepticus and has multiple other comorbidities.  Therefore, I suspect that patient has suffered significant irreversible neurologic damage and will not likely have meaningful  neurologic recovery.  Husband and family states they would like to transition her to DNR.  However it is their daughter's birthday on Thursday.  Therefore they would like to wait till Thursday and if patient continues to be comatose will transition to comfort care by the end of the week. - continue seizure precautions - PRN IV versed '5mg'$  for clinical seizure like activity -Management of rest of the comorbidities per primary team  CRITICAL CARE Performed by: Lora Havens  Total critical care time:50mnutes  Critical care time was exclusive of separately billable procedures and treating other patients.  Critical care was necessary to treat or prevent imminent or life-threatening deterioration.  Critical care was time spent personally by me on the following activities: development of treatment plan with patient and/or surrogate as well as nursing, discussions with consultants, evaluation of patient's response to treatment, examination of patient, obtaining history from patient or surrogate, ordering and performing treatments and interventions, ordering and review of laboratory studies, ordering and review of radiographic studies, pulse oximetry and re-evaluation of patient's condition.   PZeb Com[facility]ile[facility] For questions after 5pm please refer to AMION to reach the Neurologist on call

## 2019-11-02 NOTE — Progress Notes (Addendum)
Daily Progress Note   Patient Name: [name]       Date: 11/02/2019 DOB: 04/25/1971  Age: 49 y.o. MRN#: [id number] Attending Physician: Allison Alberts, MD Primary Care Physician: Allison Stains, MD Admit Date: 11/01/2019  Reason for Consultation/Follow-up: To discuss complex medical decision making related to patient's goals of care  Examined patient Well developed chronically ill appearing female with diaphoresis and increased work of breathing on the vent. CV tachy, chest closed, sutures C/D/I resp vent, breathing over vent.  Chest tubes in place Abdomen soft, nt, nd Lower extremity edema up to thighs bilaterally  Subjective:  Meeting held with [name] (husband), bother [name]), and [name] (who used to be an ICU RN).   Meeting attended by bedside RN [name], Drs [name] and [name] and myself.  Family asked several questions about time.  Drs. [name] and [name] gently but clearly let family understand that chances for recovery are virtually none.  [name] brought up the concern of code status.  I brought up the concern of patient's comfort.    Family asked questions about hope and about time.  [name] expressed concerned that his daughter's birthday is Thursday and his wife can simply not die this week - during her birthday week.     We explained that despite all of the life support she is receiving her heart may stop, she may stop breathing and unfortunately it is possible to die.  Family understood and agreed that if she died they would not want her to be coded.  We discussed avoiding interventions that make her uncomfortable and adding low dose medications that will increase her comfort. Family agreed.  They do not want her to suffer.  [name] stated "she's been in pain so  long".  After [name] and the MDs left the room - we reflected a while longer on [name]'s strength.  We talked about supporting her children and her parents.  Family provided me with a list of visitors.  Emotional support provided.  Questions answered to the best of my ability.    I let family know that I will return to work next Friday and will be available if needed.     Assessment: Patient seems less comfortable today - exerting more effort to breathe, tachycardic, diaphoretic.   Family coming to terms with EOL.  Patient Profile/HPI:  49 y.o. 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   In the OR she developed severe bleeding and hemorrhagic shock.  She subsequently was placed on ECMO.  She made another trip back to the OR for evacuation of hemothorax.  She subsequently developed status epilepticus.  After treatment with dual antiseizure medications and sedation the status stopped and she has been having intermittent seizing episodes since.  There is some concern for a degree of anoxic brain injury.  She is currently slated for ECMO decannulation on 4/12 am.  Length of Stay: 12   Vital Signs: BP 120/73   Pulse (!) 113   Temp (!) 97.2 F (36.2 C)   Resp (!) 23   Ht 5\' 7"  (1.702 m)   Wt 78.6 kg   LMP 04/02/2017   SpO2 100%   BMI 27.14 kg/m  SpO2: SpO2: 100 % O2 Device: O2 Device: Ventilator O2 Flow Rate: O2 Flow Rate (L/min): 40 L/min       Palliative Assessment/Data: 10%     Palliative Care Plan    Recommendations/Plan:  Patient DNR  Family wants to wait until next weekend.  If no signs of improvement they will proceed with compassionate wean/extubation.  Family wants interventions such as hemodialysis, ventilator support,  antibiotics etc.. continued.  Family agreeable to use of meds for comfort as appropriate - they do not want her to suffer.  Greatly appreciate Chaplain support for children - and family as well.  Have provided family information on [name].  Spoke with front desk security to allow appropriate list of family members in to see [name]  Code Status:  DNR  Prognosis:   < 2 weeks   Discharge Planning:  Anticipated Hospital Death  Care plan was discussed with bedside RN, Drs. [name], Family.  Thank you for allowing the Palliative Medicine Team to assist in the care of this patient.  Total time spent:  35 min.     Greater than 50%  of this time was spent counseling and coordinating care related to the above assessment and plan.  Allison Jenny, PA-C Palliative Medicine  Please contact Palliative MedicineTeam phone at [phone] for questions and concerns between 7 am - 7 pm.   Please see AMION for individual provider pager numbers.

## 2019-11-02 NOTE — Plan of Care (Signed)
  Problem: Education: Goal: Will demonstrate proper wound care and an understanding of methods to prevent future damage Outcome: Progressing Goal: Knowledge of disease or condition will improve Outcome: Progressing Goal: Knowledge of the prescribed therapeutic regimen will improve Outcome: Progressing Goal: Individualized Educational Video(s) Outcome: Progressing   Problem: Activity: Goal: Risk for activity intolerance will decrease Outcome: Progressing   Problem: Cardiac: Goal: Will achieve and/or maintain hemodynamic stability Outcome: Progressing   Problem: Clinical Measurements: Goal: Postoperative complications will be avoided or minimized Outcome: Progressing   Problem: Respiratory: Goal: Respiratory status will improve Outcome: Progressing   Problem: Skin Integrity: Goal: Wound healing without signs and symptoms of infection Outcome: Progressing Goal: Risk for impaired skin integrity will decrease Outcome: Progressing   Problem: Urinary Elimination: Goal: Ability to achieve and maintain adequate renal perfusion and functioning will improve Outcome: Progressing   Problem: Education: Goal: Knowledge of General Education information will improve Description: Including pain rating scale, medication(s)/side effects and non-pharmacologic comfort measures Outcome: Progressing   Problem: Health Behavior/Discharge Planning: Goal: Ability to manage health-related needs will improve Outcome: Progressing   Problem: Clinical Measurements: Goal: Ability to maintain clinical measurements within normal limits will improve Outcome: Progressing Goal: Will remain free from infection Outcome: Progressing Goal: Diagnostic test results will improve Outcome: Progressing Goal: Respiratory complications will improve Outcome: Progressing Goal: Cardiovascular complication will be avoided Outcome: Progressing   Problem: Activity: Goal: Risk for activity intolerance will  decrease Outcome: Progressing   Problem: Nutrition: Goal: Adequate nutrition will be maintained Outcome: Progressing   Problem: Coping: Goal: Level of anxiety will decrease Outcome: Progressing   Problem: Elimination: Goal: Will not experience complications related to bowel motility Outcome: Progressing Goal: Will not experience complications related to urinary retention Outcome: Progressing   Problem: Pain Managment: Goal: General experience of comfort will improve Outcome: Progressing   Problem: Safety: Goal: Ability to remain free from injury will improve Outcome: Progressing   Problem: Skin Integrity: Goal: Risk for impaired skin integrity will decrease Outcome: Progressing   Problem: Activity: Goal: Ability to tolerate increased activity will improve Outcome: Progressing   Problem: Respiratory: Goal: Ability to maintain a clear airway and adequate ventilation will improve Outcome: Progressing   Problem: Role Relationship: Goal: Method of communication will improve Outcome: Progressing

## 2019-11-02 NOTE — Procedures (Signed)
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; s/p minimmally invasive TV repair, hemorrhagic / cardiogenic shock postoperative, status epiletpicus, TCP on IVIG  Current CRRT Prescription: Catheter: R IJ  BFR: 200 Pre Blood Pump: 500 4K DFR: 1500 4K Replacement Rate: 300 4K Goal UF: 266m / h net neg Anticoagulation: None Clotting: about 1x/24h  S:  Stable in past 24h  K 4.8, P 2.6  Anuric  Tol UF, 4.9L neg yesterday  O: 04/18 0701 - 04/19 0700 In: 3143.1 [I.V.:1281.5; NG/GT:1440; IV Piggyback:271.6] Out: 8001 [Urine:20; Stool:600; Chest Tube:120]  Filed Weights   10/31/19 0432 11/01/19 0451 11/02/19 0500  Weight: 88 kg 83.7 kg 78.6 kg    Recent Labs  Lab 11/01/19 0304 11/01/19 0415 11/01/19 1540 11/02/19 0500 11/02/19 0512  NA 133*   < > 134*  136 134* 141  K 4.5   < > 4.4  4.5 4.8 4.0  CL 100  --  101 99  --   CO2 26  --  25 25  --   GLUCOSE 113*  --  150* 93  --   BUN 37*  --  39* 39*  --   CREATININE 1.63*  --  1.56* 1.61*  --   CALCIUM 8.2*  --  8.0* 8.5*  --   PHOS 2.8  --  2.5 2.6  --    < > = values in this interval not displayed.   Recent Labs  Lab 10/31/19 0418 10/31/19 1547 11/01/19 0304 11/01/19 0415 11/01/19 1540 11/02/19 0500 11/02/19 0512  WBC 10.9*  --  13.1*  --   --  16.3*  --   HGB 10.6*  13.6   < > 10.0*   < > 10.5* 10.3* 10.2*  HCT 31.2*  40.0   < > 29.3*   < > 31.0* 30.3* 30.0*  MCV 87.6  --  87.2  --   --  87.1  --   PLT 29*  --  31*  --   --  44*  --    < > = values in this interval not displayed.    Scheduled Meds: . B-complex with vitamin C  1 tablet Per Tube Daily  . bisacodyl  10 mg Oral Daily   Or  . bisacodyl  10 mg Rectal Daily  . chlorhexidine gluconate (MEDLINE KIT)  15 mL Mouth Rinse BID  . Chlorhexidine Gluconate Cloth  6 each Topical Daily  . feeding supplement (PIVOT 1.5 CAL)  1,000 mL Per Tube Q24H  . insulin aspart  0-24 Units Subcutaneous Q4H  .  levothyroxine  50 mcg Per Tube Q0600  . mouth rinse  15 mL Mouth Rinse 10 times per day  . pantoprazole (PROTONIX) IV  40 mg Intravenous QHS  . PHENObarbital  160 mg Intravenous BID  . sodium chloride flush  10-40 mL Intracatheter Q12H   Continuous Infusions: .  prismasol BGK 4/2.5 500 mL/hr at 11/02/19 0145  .  prismasol BGK 4/2.5 300 mL/hr at 11/01/19 1900  . sodium chloride Stopped (10/27/19 1308)  . sodium chloride Stopped (10/30/19 0957)  . amiodarone 60 mg/hr (11/02/19 0800)  . dextrose Stopped (10/30/19 0959)  . Immune Globulin 10% Stopped (11/01/19 1300)  . lacosamide (VIMPAT) IV Stopped (11/01/19 2151)  . levETIRAcetam Stopped (11/02/19 0126)  . milrinone 0.125 mcg/kg/min (11/02/19 0800)  . norepinephrine (LEVOPHED) Adult infusion Stopped (10/29/19 1902)  . prismasol BGK 4/2.5 1,500 mL/hr at 11/02/19 0205  . propofol (  DIPRIVAN) infusion Stopped (10/29/19 0415)   PRN Meds:.sodium chloride, sodium chloride, acetaminophen (TYLENOL) oral liquid 160 mg/5 mL, artificial tears, dextrose, dextrose, diphenhydrAMINE, heparin, heparin, metoprolol tartrate, midazolam, morphine injection, ondansetron (ZOFRAN) IV, sodium chloride flush  ABG    Component Value Date/Time   PHART 7.384 11/02/2019 0512   PCO2ART 42.0 11/02/2019 0512   PO2ART 105.0 11/02/2019 0512   HCO3 25.2 11/02/2019 0512   TCO2 26 11/02/2019 0512   ACIDBASEDEF 1.0 10/28/2019 1841   O2SAT 98.0 11/02/2019 0512    A/P  1. 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 Shock, cardiogenic and hemorrhagic, on inotrope; significant RV failure 3. VDRF. Inhaled NO2 per CCM / AHF / TCTS 4. Status post minimally invasive tricuspid valve repair 10/19/2019 5. ABLA  6. 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 SLE 8. ITP, on IVIG  Cont CRRT current settings.    Rexene Agent , MD [facility]

## 2019-11-02 NOTE — Progress Notes (Signed)
[facility] Progress Note Patient Name: [name] DOB: Oct 07, 1970 MRN: [id number]   Date of Service  11/02/2019  HPI/Events of Note  Camera: Discussed with bed side RN. Shoulder jerky movement in mid night. None now. Suspected ABI, off of sedation. On 50% Vent.on anti epileptic meds. Last ABg ok. Cr 1.5   eICU Interventions  Continue care. Watch for now. Will avoid versed for now.      Intervention Category Major Interventions: Seizures - evaluation and management  Elmer Sow 11/02/2019, 2:15 AM

## 2019-11-02 NOTE — Progress Notes (Signed)
PCCM:  I meet with the palliative care team and neurology for a family meeting.   They are agreeable to DNR status. At this time, they understand her prognosis. Her daughters birthday is this Thursday. The husband would like to wait until after thrusday to make any decisions regarding comfort care transition.   They do not want prolonged mechanical support of any kind.   35 mins of advanced care planning time.   [facility] 11/02/2019 1:36 PM

## 2019-11-02 NOTE — Progress Notes (Signed)
[name]       ,[place] [address]             [id number]        CARDIOTHORACIC SURGERY PROGRESS NOTE   R7 Days Post-Op Procedure(s) (LRB): MEDIASTERNAL WASH OUT,  ECMO DECANNULATION AND STERNAL CLOSURE (N/A) TRANSESOPHAGEAL ECHOCARDIOGRAM (TEE) (N/A)  Subjective: Unresponsive on vent.  No reported seizure activity  Objective: Vital signs: BP Readings from Last 1 Encounters:  10/30/19 120/73   Pulse Readings from Last 1 Encounters:  11/02/19 (!) 117   Resp Readings from Last 1 Encounters:  11/02/19 (!) 35   Temp Readings from Last 1 Encounters:  11/02/19 (!) 97.3 F (36.3 C)    Hemodynamics: CVP:  [9 mmHg-18 mmHg] 9 mmHg  Mixed venous co-ox 69%   Physical Exam:  Rhythm:   Aflutter  Breath sounds: coarse  Heart sounds:  tachy  Incisions:  Clean and dry  Abdomen:  Soft, non-distended, + stool  Extremities:  Warm, well-perfused   Intake/Output from previous day: 04/18 0701 - 04/19 0700 In: 3143.1 [I.V.:1281.5; NG/GT:1440; IV Piggyback:271.6] Out: 8001 [Urine:20; Stool:600; Chest Tube:120] Intake/Output this shift: Total I/O In: 97 [I.V.:37; NG/GT:60] Out: 279 [Other:279]  Lab Results:  CBC: Recent Labs    11/01/19 0304 11/01/19 0415 11/02/19 0500 11/02/19 0512  WBC 13.1*  --  16.3*  --   HGB 10.0*   < > 10.3* 10.2*  HCT 29.3*   < > 30.3* 30.0*  PLT 31*  --  44*  --    < > = values in this interval not displayed.    BMET:  Recent Labs    11/01/19 1540 11/01/19 1540 11/02/19 0500 11/02/19 0512  NA 134*  136   < > 134* 141  K 4.4  4.5   < > 4.8 4.0  CL 101  --  99  --   CO2 25  --  25  --   GLUCOSE 150*  --  93  --   BUN 39*  --  39*  --   CREATININE 1.56*  --  1.61*  --   CALCIUM 8.0*  --  8.5*  --    < > = values in this interval not displayed.     PT/INR:   Recent Labs    11/02/19 0500  LABPROT 15.1  INR 1.2    CBG (last 3)  Recent Labs    11/02/19 0305 11/02/19 0501 11/02/19 0726  GLUCAP  90 94 73    ABG    Component Value Date/Time   PHART 7.384 11/02/2019 0512   PCO2ART 42.0 11/02/2019 0512   PO2ART 105.0 11/02/2019 0512   HCO3 25.2 11/02/2019 0512   TCO2 26 11/02/2019 0512   ACIDBASEDEF 1.0 10/28/2019 1841   O2SAT 98.0 11/02/2019 0512    CXR: PORTABLE CHEST 1 VIEW  COMPARISON:  11/01/2019  FINDINGS: Endotracheal tube is 6.6 cm above the carina. Nasogastric tube extends into the abdomen. History of tricuspid valve repair. Right jugular central line is stable in the upper SVC region. Left arm PICC line tip near the SVC and right atrium junction. Stable position of the right chest tube. Patchy densities throughout the right hemithorax are unchanged. Lateral right hemidiaphragm remains obscured. Negative for pneumothorax. Heart size is stable.  IMPRESSION: 1. Right chest tube is stable without a pneumothorax. 2. No change in the patchy densities throughout the right right hemithorax. 3. Stable support apparatuses.   Electronically Signed  By: Markus Daft M.D.   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  Palliative care team involved.  Family meeting pending later today.  Rexene Alberts, MD 11/02/2019 8:19 AM

## 2019-11-02 NOTE — Progress Notes (Addendum)
Hypoglycemic Event  CBG: 49  Treatment: 25gm D50   Symptoms: None/unable to assess  Follow-up CBG: Time: 0127 CBG Result:109  Possible Reasons for Event: Unknown  Comments/MD notified:Elink made aware, and hypoglycemia protocol followed.     Aashish Hamm B [place]

## 2019-11-02 NOTE — Progress Notes (Addendum)
NAME:  [name], MRN:  [id number], DOB:  April 18, 1971, LOS: 12 ADMISSION DATE:  10/29/2019, CONSULTATION DATE:  11/02/2019  REFERRING MD:  Rexene Alberts, MD, CHIEF COMPLAINT:  Vent management.  History of present illness   The patient is a 49 year old woman who is postoperative day 10 from minimally invasive tricuspid valve repair which was then converted into an open procedure.  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  However despite echocardiograms showing severely elevated RA pressures, she has had 2 right heart cath that demonstrated normal peripheral vascular resistance for Group 1 pulmonary arterial hypertension.  She was taken to the OR by Dr. Roxy Manns on 4/7 given her persistent symptoms for repair of severe tricuspid regurgitation.  4/8, she had reexploration of her median sternotomy with concern for postoperative bleeding.  There was a large amount of clot evacuated from the anterior mediastinum and significant oozing diffusely.  Does not appear that there were any significant signs for mechanical active bleeding.  Bili at that time she was cannulated for ECMO and started on [place] ECMO on 4/8.  Since then she has had substantial correction of her coagulopathy including Kcentra, FEIBA, cryoprecipitate, FFP and copious blood products given her substantial bleeding in the mediastinum.  She went for decannulation from [facility] ECMO on 4/12. She remains critically ill with recurrent seizure like activity from a presumed anoxic brain injury.   PCCM is being consulted to assist with vent management.  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   Consults:  Heart failure, PCCM, Palliative, Neurology  Procedures:  4/7 tricuspid valve repair through median sternotomy 4/8 reexploration of the mediastinum with cannulation for ECMO 4/12 decannulation from [facility] ECMO  4/14: HD cath right IJ  Significant Diagnostic Tests:  Transesophageal echocardiogram January 2021  1. Left ventricular ejection fraction, by visual estimation, is 60 to  65%. The left ventricle has normal function. There is no left ventricular  hypertrophy.  2. The left ventricle has no regional wall motion abnormalities.  3. Global right ventricle has mildly reduced systolic function.The right  ventricular size is normal. No increase in right ventricular wall  thickness.  4. Left atrial size was moderately dilated.  5. Right atrial size was severely dilated.  6. The mitral valve is normal in structure. Trivial mitral valve  regurgitation.  7. The tricuspid valve is abnormal.  8. The tricuspid valve is abnormal. Tricuspid valve regurgitation is  severe.  9. Apparent mild restriction of septal leaflet. RVSP ~ 44mmHG.  10. The aortic valve is normal in structure. Aortic valve regurgitation is  not visualized.  11. The pulmonic valve was grossly normal. Pulmonic valve regurgitation is  not visualized.  12. Mild plaque invoving the descending aorta.  13. Severely elevated pulmonary artery systolic pressure.   Right heart catheterization June 2020 Findings:  RA = 5 RV = 34/7 PA =  34/6 (20) PCW = 8 Fick cardiac output/index = 5.9/3.1 PVR = 2.0 WU Ao sat = 100% PA sat = 71%, 72% SVC sat = 66%  Assessment: 1. Minimally  elevated PA pressures with normal PVR and cardiac output 2. No evidence of significant  intracardiac shunt  CT Head 4/15: Evaluation limited by streak and beam hardening artifact arising from scalp monitoring leads.  Apparent asymmetric hypodensity within the right cerebellar hemisphere may reflect asymmetric beam hardening artifact. Edema related to infarct or hypoxic/ischemic injury cannot be excluded. Consider brain MRI for further evaluation, as clinically warranted.  Paranasal sinus mucosal thickening.  Large bilateral mastoid effusions. Associated left middle ear effusion.  Micro Data:  4/16: Respiratory cultures growing yeast, incubation for better growth  Antimicrobials:  Levofloxacin 4/7 >> 4/8 Vancomycin 4/7 >> 4/12 Meropenem 4/9 >> 4/13  Subjective:  Remains off sedation and unresponsive. Family meeting today per palliative notes. -3.6L over the last 24 hours. Difficulties with hypoglycemia.   Objective   Blood pressure 120/73, pulse (!) 104, temperature 99 F (37.2 C), resp. rate (!) 34, height 5\' 7"  (1.702 m), weight 83.7 kg, last menstrual period 04/02/2017, SpO2 100 %. CVP:  [11 mmHg-18 mmHg] 17 mmHg  Vent Mode: PRVC FiO2 (%):  [50 %] 50 % Set Rate:  [26 bmp] 26 bmp Vt Set:  [400 mL] 400 mL PEEP:  [5 cmH20] 5 cmH20 Plateau Pressure:  [18 cmH20-29 cmH20] 29 cmH20   Intake/Output Summary (Last 24 hours) at 11/02/2019 0508 Last data filed at 11/02/2019 0300 Gross per 24 hour  Intake 2950.2 ml  Output 7234 ml  Net -4283.8 ml   Filed Weights   10/30/19 0500 10/31/19 0432 11/01/19 0451  Weight: 92.6 kg 88 kg 83.7 kg   Examination:  GEN: young fm, intubated, on mechanical support  HEENT: EET in place  CV: Tachycardic, RRR PULM: RRR, s1 s2 GI: soft, nt nd  EXT: BL dependent edema  NEURO: unresponsive to pain, no tracking   Resolved Hospital Problem list   None  Assessment & Plan:  [name] is a 49 y.o. woman with history of SLE with thrombocytopenia, nephritis, AVN and possible mild chronic RV failure who presented for  repair of severe symptomatic tricuspid regurgitation on 4/7.    Acute hypoxemic respiratory failure, on mechanical ventilation  - Remains on full vent support  - Mental status precludes liberation  - CXR reviewed - stable compared to 4/18 with right sided opacity   Cardiogenic Shock - V/A ecmo 4/8-4/12 - Milrinone per HF - Volume removal with CVVHD   SLE with immune mediated thrombocytopenia and coagulopathy - No other clinical evidence of bleeding.  - Continue IVIG (Day 5/5)   Acute encephalopathy Status Epilepticus  - AEDS per neurology  - We appreciate neuro prognostication   WHO Group 1 PAH  - Attempted to wean iNO but resulted in elevated CVP and decreased CO/CI.  - Hopefully with continued volume removal will be able to wean iNO. Volume management with cvvhd   GOC:  Per nursing cardiothoracic surgery has plans to meet with family today.  Best practice:  Diet: TF with free water Pain/Anxiety/Delirium protocol (if indicated): None VAP protocol (if indicated): Ordered DVT prophylaxis: heparin gtt GI prophylaxis: Pantoprazole Glucose control: Controlled, blood sugars less than 180 Foley required for critically ill state Mobility: Bedrest Code Status: Full code Family Communication: Per primary team Disposition: Needs ICU  Please see attending attestation for final recommendations.   Ina Homes, MD  IM[facility] PGY3 Pager: [phone]     PCCM attending:  49 year old female taken for mitral valve repair history of SLE, pulmonary hypertension.  Patient developed decompensated cardiogenic shock postoperatively requiring VA ECMO  support.  Ultimately hospital course complicated by status epilepticus, burst suppression now with very little neurologic recovery being 4 days off sedation. Ongoing palliative care discussions at this time.  BP 120/73   Pulse (!) 113   Temp (!) 97.2 F (36.2 C)   Resp (!) 23   Ht 5\' 7"  (1.702 m)   Wt 78.6 kg   LMP 04/02/2017   SpO2 100%    BMI 27.14 kg/m   General: Young female intubated on mechanical life support CVVHD going Heart: Regular rhythm S1-S2 Lungs: Bilateral clinically ventilated breath sounds Abdomen: Soft nontender nondistended  Labs: Reviewed Chest x-ray: Reviewed  Assessment: Acute hypoxemic respiratory failure requiring intubation mechanical ventilation Cardiogenic shock Status post tricuspid valve repair SLE, immune related thrombocytopenia and coagulopathy Who group 1 pulmonary hypertension Status epilepticus  Plan: Due to the patient's poor neurologic recovery at this time 4 days off of sedation concern for significant injury to the brain.  Also has poor EEG findings. Plans for ongoing palliation. We discussed with the family today all of her current comorbidities and current medical therapy. At this time she will be changed to a durable DNR. Likely progress towards comfort care transition within the coming days. Patient's daughter's birthday is this Thursday and the patient's husband would like to wait at least until after Thursday before making any decisions regarding withdrawal of care. They are agreeable to changing her CODE STATUS and I was focusing on her comfort level.  At this time continue full mechanical support to include invasive mechanical ventilation and CVVHD.  This patient is critically ill with multiple organ system failure; which, requires frequent high complexity decision making, assessment, support, evaluation, and titration of therapies. This was completed through the application of advanced monitoring technologies and extensive interpretation of multiple databases. During this encounter critical care time was devoted to patient care services described in this note for 32 minutes.  Garner Nash, DO [facility] 11/02/2019 3:39 PM

## 2019-11-02 NOTE — Progress Notes (Signed)
Chaplain engaged in visit with Mrs. Setterlund's husband and the God-Mother of their children.  Chaplain prayed with family around [place].  Chaplain offered support and will look into resources for Mr. Pultz to speak with his children about his wife's current health status.    Chaplain will continue to follow-up.

## 2019-11-03 DIAGNOSIS — Z515 Encounter for palliative care: Secondary | ICD-10-CM

## 2019-11-03 LAB — RENAL FUNCTION PANEL
Albumin: 2.3 g/dL — ABNORMAL LOW (ref 3.5–5.0)
Albumin: 2.3 g/dL — ABNORMAL LOW (ref 3.5–5.0)
Anion gap: 9 (ref 5–15)
Anion gap: 8 (ref 5–15)
BUN: 39 mg/dL — ABNORMAL HIGH (ref 6–20)
BUN: 41 mg/dL — ABNORMAL HIGH (ref 6–20)
CO2: 26 mmol/L (ref 22–32)
CO2: 24 mmol/L (ref 22–32)
Calcium: 8.2 mg/dL — ABNORMAL LOW (ref 8.9–10.3)
Calcium: 8.3 mg/dL — ABNORMAL LOW (ref 8.9–10.3)
Chloride: 97 mmol/L — ABNORMAL LOW (ref 98–111)
Chloride: 102 mmol/L (ref 98–111)
Creatinine, Ser: 1.57 mg/dL — ABNORMAL HIGH (ref 0.44–1.00)
Creatinine, Ser: 1.69 mg/dL — ABNORMAL HIGH (ref 0.44–1.00)
GFR calc Af Amer: 45 mL/min — ABNORMAL LOW (ref >60)
GFR calc Af Amer: 41 mL/min — ABNORMAL LOW (ref >60)
GFR calc non Af Amer: 39 mL/min — ABNORMAL LOW (ref >60)
GFR calc non Af Amer: 36 mL/min — ABNORMAL LOW (ref >60)
Glucose, Bld: 166 mg/dL — ABNORMAL HIGH (ref 70–99)
Glucose, Bld: 88 mg/dL (ref 70–99)
Phosphorus: 2.5 mg/dL (ref 2.5–4.6)
Phosphorus: 2.8 mg/dL (ref 2.5–4.6)
Potassium: 4.5 mmol/L (ref 3.5–5.1)
Potassium: 4.6 mmol/L (ref 3.5–5.1)
Sodium: 132 mmol/L — ABNORMAL LOW (ref 135–145)
Sodium: 134 mmol/L — ABNORMAL LOW (ref 135–145)

## 2019-11-03 LAB — COOXEMETRY PANEL
Carboxyhemoglobin: 2 % — ABNORMAL HIGH (ref 0.5–1.5)
Methemoglobin: 1.4 % (ref 0.0–1.5)
O2 Saturation: 59.3 %
Total hemoglobin: 9.5 g/dL — ABNORMAL LOW (ref 12.0–16.0)

## 2019-11-03 LAB — GLUCOSE, CAPILLARY
Glucose-Capillary: 112 mg/dL — ABNORMAL HIGH (ref 70–99)
Glucose-Capillary: 117 mg/dL — ABNORMAL HIGH (ref 70–99)
Glucose-Capillary: 140 mg/dL — ABNORMAL HIGH (ref 70–99)
Glucose-Capillary: 149 mg/dL — ABNORMAL HIGH (ref 70–99)
Glucose-Capillary: 194 mg/dL — ABNORMAL HIGH (ref 70–99)
Glucose-Capillary: 59 mg/dL — ABNORMAL LOW (ref 70–99)
Glucose-Capillary: 60 mg/dL — ABNORMAL LOW (ref 70–99)
Glucose-Capillary: 67 mg/dL — ABNORMAL LOW (ref 70–99)
Glucose-Capillary: 82 mg/dL (ref 70–99)

## 2019-11-03 LAB — POCT I-STAT 7, (LYTES, BLD GAS, ICA,H+H)
Acid-Base Excess: 2 mmol/L (ref 0.0–2.0)
Acid-Base Excess: 3 mmol/L — ABNORMAL HIGH (ref 0.0–2.0)
Bicarbonate: 27.7 mmol/L (ref 20.0–28.0)
Bicarbonate: 27.9 mmol/L (ref 20.0–28.0)
Calcium, Ion: 1.2 mmol/L (ref 1.15–1.40)
Calcium, Ion: 1.2 mmol/L (ref 1.15–1.40)
HCT: 29 % — ABNORMAL LOW (ref 36.0–46.0)
HCT: 34 % — ABNORMAL LOW (ref 36.0–46.0)
Hemoglobin: 9.9 g/dL — ABNORMAL LOW (ref 12.0–15.0)
Hemoglobin: 11.6 g/dL — ABNORMAL LOW (ref 12.0–15.0)
O2 Saturation: 98 %
O2 Saturation: 99 %
Patient temperature: 36.1
Patient temperature: 36.5
Potassium: 4.3 mmol/L (ref 3.5–5.1)
Potassium: 4.6 mmol/L (ref 3.5–5.1)
Sodium: 138 mmol/L (ref 135–145)
Sodium: 137 mmol/L (ref 135–145)
TCO2: 29 mmol/L (ref 22–32)
TCO2: 29 mmol/L (ref 22–32)
pCO2 arterial: 42.8 mmHg (ref 32.0–48.0)
pCO2 arterial: 40.3 mmHg (ref 32.0–48.0)
pH, Arterial: 7.414 (ref 7.350–7.450)
pH, Arterial: 7.447 (ref 7.350–7.450)
pO2, Arterial: 96 mmHg (ref 83.0–108.0)
pO2, Arterial: 113 mmHg — ABNORMAL HIGH (ref 83.0–108.0)

## 2019-11-03 LAB — HEPATIC FUNCTION PANEL
ALT: 22 U/L (ref 0–44)
AST: 47 U/L — ABNORMAL HIGH (ref 15–41)
Albumin: 2.3 g/dL — ABNORMAL LOW (ref 3.5–5.0)
Alkaline Phosphatase: 165 U/L — ABNORMAL HIGH (ref 38–126)
Bilirubin, Direct: 0.5 mg/dL — ABNORMAL HIGH (ref 0.0–0.2)
Indirect Bilirubin: 0.6 mg/dL (ref 0.3–0.9)
Total Bilirubin: 1.1 mg/dL (ref 0.3–1.2)
Total Protein: 7.5 g/dL (ref 6.5–8.1)

## 2019-11-03 LAB — PROTIME-INR
INR: 1.2 (ref 0.8–1.2)
Prothrombin Time: 15.3 seconds — ABNORMAL HIGH (ref 11.4–15.2)

## 2019-11-03 LAB — CBC
HCT: 26.7 % — ABNORMAL LOW (ref 36.0–46.0)
Hemoglobin: 9 g/dL — ABNORMAL LOW (ref 12.0–15.0)
MCH: 29 pg (ref 26.0–34.0)
MCHC: 33.7 g/dL (ref 30.0–36.0)
MCV: 86.1 fL (ref 80.0–100.0)
Platelets: 50 10*3/uL — ABNORMAL LOW (ref 150–400)
RBC: 3.1 MIL/uL — ABNORMAL LOW (ref 3.87–5.11)
RDW: 18.2 % — ABNORMAL HIGH (ref 11.5–15.5)
WBC: 11.4 10*3/uL — ABNORMAL HIGH (ref 4.0–10.5)
nRBC: 0 % (ref 0.0–0.2)

## 2019-11-03 LAB — MAGNESIUM: Magnesium: 2.4 mg/dL (ref 1.7–2.4)

## 2019-11-03 MED ORDER — AMIODARONE IV BOLUS ONLY 150 MG/100ML
150.0000 mg | Freq: Once | INTRAVENOUS | Status: DC
  Filled 2019-11-03: qty 100

## 2019-11-03 MED ORDER — SODIUM CHLORIDE 0.9 % IV SOLN
500.0000 [IU]/h | INTRAVENOUS | Status: DC
  Administered 2019-11-03 – 2019-11-08 (×6): 500 [IU]/h via INTRAVENOUS_CENTRAL
  Filled 2019-11-03 (×4): qty 2
  Filled 2019-11-03: qty 10000
  Filled 2019-11-03: qty 2

## 2019-11-03 NOTE — Progress Notes (Signed)
Inpatient Diabetes Program Recommendations  AACE/ADA: New Consensus Statement on Inpatient Glycemic Control (2015)  Target Ranges:  Prepandial:   less than 140 mg/dL      Peak postprandial:   less than 180 mg/dL (1-2 hours)      Critically ill patients:  140 - 180 mg/dL   Lab Results  Component Value Date   GLUCAP 140 (H) 11/03/2019   HGBA1C 7.3 (H) 10/19/2019    Review of Glycemic Control Results for [facility], [name] (MRN [id number]) as of 11/03/2019 11:29  Ref. Range 11/03/2019 00:06 11/03/2019 04:25 11/03/2019 04:27 11/03/2019 07:35 11/03/2019 07:57  Glucose-Capillary Latest Ref Range: 70 - 99 mg/dL 117 (H) 59 (L) 149 (H) Novolog 2 units 60 (L) 140 (H) Novolog 2 units   Inpatient Diabetes Program Recommendations:   Consider decrease in Novolog correction to moderate 0-15 units.  Thank you, Nani Gasser. Frankie Scipio, RN, MSN, CDE  Diabetes Coordinator Inpatient Glycemic Control Team Team Pager [phone] (8am-5pm) 11/03/2019 11:30 AM

## 2019-11-03 NOTE — Progress Notes (Addendum)
NAME:  [name], MRN:  [id number], DOB:  21-Oct-1970, LOS: 13 ADMISSION DATE:  10/30/2019, CONSULTATION DATE:  11/03/2019  REFERRING MD:  Rexene Alberts, MD, CHIEF COMPLAINT:  Vent management.  History of present illness   The patient is a 49 year old woman who is POD 10 from minimally invasive tricuspid valve repair which was then converted into an open procedure.  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  Being followed by the HF team for evaluation of PAH, likely group 1.  However despite echocardiograms showing severely elevated RA pressures, she has had 2 right heart cath that demonstrated normal peripheral vascular resistance for Group 1 PAH.  She was taken to the OR by Dr. Roxy Manns on 4/7 given her persistent symptoms for repair of severe tricuspid regurgitation.  4/8, she had reexploration of her median sternotomy with concern for postop bleeding.  There was a large amount of clot evacuated from the anterior mediastinum and significant oozing diffusely.  Does not appear that there were any significant signs for mechanical active bleeding.  Bili at that time she was cannulated for ECMO and started on [place] ECMO on 4/8.  Since then she has had substantial correction of her coagulopathy including Kcentra, FEIBA, cryoprecipitate, FFP and copious blood products given her substantial bleeding in the mediastinum.  She went for decannulation from [facility] ECMO on 4/12. She remains critically ill with recurrent seizure like activity from a presumed anoxic brain injury.   PCCM is being consulted to assist with vent management.  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   Consults:  Heart failure, PCCM, Palliative, Neurology  Procedures:  4/7 tricuspid valve repair through median sternotomy 4/8 reexploration of the mediastinum with cannulation for ECMO 4/12 decannulation from [facility] ECMO  4/14: HD cath right IJ  Significant Diagnostic Tests:  Transesophageal echocardiogram January 2021  1. Left ventricular ejection fraction, by visual estimation, is 60 to  65%. The left ventricle has normal function. There is no left ventricular  hypertrophy.  2. The left ventricle has no regional wall motion abnormalities.  3. Global right ventricle has mildly reduced systolic function.The right  ventricular size is normal. No increase in right ventricular wall  thickness.  4. Left atrial size was moderately dilated.  5. Right atrial size was severely dilated.  6. The mitral valve is normal in structure. Trivial mitral valve  regurgitation.  7. The tricuspid valve is abnormal.  8. The tricuspid valve is abnormal. Tricuspid valve regurgitation is  severe.  9. Apparent mild restriction of septal leaflet. RVSP ~ 64mHG.  10. The aortic valve is normal in structure. Aortic valve regurgitation is  not visualized.  11. The pulmonic valve was grossly normal. Pulmonic valve regurgitation is  not visualized.  12. Mild plaque invoving the descending aorta.  13. Severely elevated pulmonary artery systolic pressure.   Right heart catheterization June 2020 Findings:  RA = 5 RV = 34/7 PA =  34/6 (20) PCW = 8 Fick cardiac output/index = 5.9/3.1 PVR = 2.0 WU Ao sat = 100% PA sat = 71%, 72% SVC sat = 66%  Assessment: 1. Minimally elevated PA pressures with normal PVR and cardiac  output 2. No evidence of significant intracardiac shunt  CT Head 4/15: Evaluation limited by streak and beam hardening artifact arising from scalp  monitoring leads.  Apparent asymmetric hypodensity within the right cerebellar hemisphere may reflect asymmetric beam hardening artifact. Edema related to infarct or hypoxic/ischemic injury cannot be excluded. Consider brain MRI for further evaluation, as clinically warranted.  Paranasal sinus mucosal thickening.  Large bilateral mastoid effusions. Associated left middle ear effusion.  Micro Data:  4/16: Respiratory cultures growing yeast, incubation for better growth  Antimicrobials:  Levofloxacin 4/7 >> 4/8 Vancomycin 4/7 >> 4/12 Meropenem 4/9 >> 4/13  Subjective:  No acute events overnight. Remains off sedation and unresponsive. Chest tube and foley removed this AM.   Objective   Blood pressure 120/73, pulse (!) 113, temperature (!) 97.3 F (36.3 C), resp. rate (!) 28, height '5\' 7"'$  (1.702 m), weight 77.8 kg, last menstrual period 04/02/2017, SpO2 99 %. CVP:  [9 mmHg-64 mmHg] 14 mmHg  Vent Mode: PRVC FiO2 (%):  [50 %-70 %] 50 % Set Rate:  [26 bmp] 26 bmp Vt Set:  [400 mL] 400 mL PEEP:  [5 cmH20] 5 cmH20 Plateau Pressure:  [23 cmH20-26 cmH20] 23 cmH20   Intake/Output Summary (Last 24 hours) at 11/03/2019 5573 Last data filed at 11/03/2019 0600 Gross per 24 hour  Intake 2606.79 ml  Output 5695 ml  Net -3088.21 ml   Filed Weights   11/01/19 0451 11/02/19 0500 11/03/19 0406  Weight: 83.7 kg 78.6 kg 77.8 kg   Examination:  GEN: young female, edematous, on vent  HEENT: ETT in place  CV: tachycardic PULM: ventilated breath sounds bilaterally  GI: soft, NTND EXT: pitting edema bilaterally, warm NEURO: unresponsive to pain, no pupillary response   Resolved Hospital Problem list   Convulsive status epilepticus   Assessment & Plan:  [name] is a 49 y.o. woman with history of SLE with thrombocytopenia, nephritis, AVN and possible mild chronic RV failure who presented for repair of severe symptomatic tricuspid regurgitation on 4/7.    # Acute hypoxemic  respiratory failure, on mechanical ventilation  -- Remains on full vent support  -- Off sedation x 5 days and remains unresponsive  -- Mental status precludes liberation from vent  -- VAP precautions in place  --[place] discussion 4/19: DNR/DNI, her daughter's birthday is this Thursday and family would like to wait until then before making any decisions about withdrawal of care  # Cardiogenic Shock # S/p tricuspid valve repair  -- V/A ecmo 4/8-4/12 -- Milrinone per HF -- Volume removal with CVVHD, 2 lbs down   # SLE with immune mediated thrombocytopenia and coagulopathy -- No other clinical evidence of bleeding.  -- Completed 5 days of IVIG 4/19   # Acute encephalopathy # Status Epilepticus, resolved  # Suspected hypoxic brain injury  -- S/p burst suppression  -- AEDs per neurology  -- We appreciate neuro prognostication   # WHO Group 1 PAH  -- Wean off iNO yesterday  -- Volume management with cvvhd    Best practice:  Diet: TF with free water Pain/Anxiety/Delirium protocol (if indicated): None VAP protocol (if indicated): Ordered DVT prophylaxis: heparin gtt GI prophylaxis: Pantoprazole Glucose control: Controlled, blood sugars less than 180 Foley required for critically ill state Mobility: Bedrest Code Status: DNR  Family Communication: Per primary team Disposition: ICU  Please see attending attestation for final recommendations.    PCCM attending:  This is a 49 year old status post tricuspid valve repair, hospital course complicated by cardiogenic  shock, [facility] ECMO, status epilepticus concern for anoxic brain injury.  At this point has not had any meaningful neurologic recovery.  Palliative care meetings underway.  Met with patient's family yesterday.  They are considering withdrawal of care on Friday.  The patient's daughter's birthday is on Thursday and they were unwilling to make any of these decisions until after the patient's daughter's birthday. Patient remains on  full mechanical support until   Agree with resident documentation as stated above.   Garner Nash, DO [facility] 11/03/2019 4:10 PM

## 2019-11-03 NOTE — Progress Notes (Addendum)
Patient's hourly CRRT numbers had been steadily increasing all shift. At 1500, pt's numbers were very high. CRRT stopped, blood returned, and filter changed. After approx 25 minutes, machine alarmed for excessively high TMP levels. Blood was able to be returned. Dr. Joelyn Oms notified; stated to try another filter as the previous one had lasted 24 hours and it seemed unlikely this one would immediately stop working. Filter changed. After approx 20 minutes with the new filter, machine alarmed for excessively high TMP levels. Blood was able to be returned. Nephrology stated they would need the ok from Dr. Roxy Manns to begin heparin through the CRRT machine due to patient's history with bleeding. Dr. Roxy Manns paged and returned call from the OR and stated he was unable to assist at this time and deferred the question to CCM. Dr. Valeta Harms notified who gave the ok to begin heparin. Dr. Joelyn Oms called and orders received for a continuous heparin rate through the CRRT machine.  Joellen Jersey, RN

## 2019-11-03 NOTE — Procedures (Signed)
Admit: 11/11/2019 LOS: 32  77F dialysis dependent AKI with baseline CKD from lupus nephritis; s/p minimmally invasive TV repair, hemorrhagic / cardiogenic shock postoperative, status epiletpicus, TCP on IVIG  Current CRRT Prescription: Catheter: R IJ Temp HD cath BFR: 200 Pre Blood Pump: 500 4K DFR: 1500 4K Replacement Rate: 300 4K Goal UF: 263m / h net neg Anticoagulation: None Clotting: about 1x/24h  S:  Stable in past 24h; DNR now  K 4.3, P 2.5  Anuric  Tol UF, 3.1L neg yesterday  O: 04/19 0701 - 04/20 0700 In: 2576.7 [I.V.:1286.7; NG/GT:1020; IV Piggyback:270] Out: 5683 [Urine:35; Stool:450; Chest Tube:150]  Filed Weights   11/01/19 0451 11/02/19 0500 11/03/19 0406  Weight: 83.7 kg 78.6 kg 77.8 kg    Recent Labs  Lab 11/02/19 0500 11/02/19 0512 11/02/19 1538 11/02/19 1538 11/02/19 1539 11/03/19 0355 11/03/19 0427  NA 134*   < > 132*   < > 135 132* 138  K 4.8   < > 4.6   < > 4.7 4.5 4.3  CL 99  --  100  --   --  97*  --   CO2 25  --  27  --   --  26  --   GLUCOSE 93  --  160*  --   --  166*  --   BUN 39*  --  41*  --   --  39*  --   CREATININE 1.61*  --  1.60*  --   --  1.57*  --   CALCIUM 8.5*  --  7.8*  --   --  8.2*  --   PHOS 2.6  --  2.1*  --   --  2.5  --    < > = values in this interval not displayed.   Recent Labs  Lab 11/01/19 0304 11/01/19 0415 11/02/19 0500 11/02/19 0512 11/02/19 1539 11/03/19 0355 11/03/19 0427  WBC 13.1*  --  16.3*  --   --  11.4*  --   HGB 10.0*   < > 10.3*   < > 10.2* 9.0* 9.9*  HCT 29.3*   < > 30.3*   < > 30.0* 26.7* 29.0*  MCV 87.2  --  87.1  --   --  86.1  --   PLT 31*  --  44*  --   --  50*  --    < > = values in this interval not displayed.    Scheduled Meds: . bisacodyl  10 mg Oral Daily   Or  . bisacodyl  10 mg Rectal Daily  . chlorhexidine gluconate (MEDLINE KIT)  15 mL Mouth Rinse BID  . Chlorhexidine Gluconate Cloth  6 each Topical Daily  . feeding supplement (PIVOT 1.5 CAL)  1,000 mL Per Tube  Q24H  . insulin aspart  0-24 Units Subcutaneous Q4H  . levothyroxine  50 mcg Per Tube Q0600  . mouth rinse  15 mL Mouth Rinse 10 times per day  . pantoprazole (PROTONIX) IV  40 mg Intravenous QHS  . PHENObarbital  160 mg Intravenous BID  . sodium chloride flush  10-40 mL Intracatheter Q12H   Continuous Infusions: .  prismasol BGK 4/2.5 500 mL/hr at 11/03/19 0810  .  prismasol BGK 4/2.5 300 mL/hr at 11/03/19 0125  . sodium chloride Stopped (10/27/19 1308)  . sodium chloride Stopped (10/30/19 0957)  . amiodarone 60 mg/hr (11/03/19 0700)  . dextrose Stopped (10/30/19 0959)  . lacosamide (VIMPAT) IV Stopped (11/02/19 2152)  . levETIRAcetam  Stopped (11/02/19 2327)  . milrinone 0.125 mcg/kg/min (11/03/19 0700)  . prismasol BGK 4/2.5 1,500 mL/hr at 11/03/19 0800  . propofol (DIPRIVAN) infusion Stopped (10/29/19 0415)   PRN Meds:.sodium chloride, sodium chloride, artificial tears, dextrose, dextrose, heparin, heparin, HYDROmorphone (DILAUDID) injection, LORazepam, metoprolol tartrate, midazolam, ondansetron (ZOFRAN) IV, sodium chloride flush  ABG    Component Value Date/Time   PHART 7.414 11/03/2019 0427   PCO2ART 42.8 11/03/2019 0427   PO2ART 96.0 11/03/2019 0427   HCO3 27.7 11/03/2019 0427   TCO2 29 11/03/2019 0427   ACIDBASEDEF 1.0 10/28/2019 1841   O2SAT 98.0 11/03/2019 0427    A/P  1. 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 Shock, cardiogenic and hemorrhagic, on inotrope; significant RV failure 3. VDRF. Inhaled NO2 per CCM / AHF / TCTS 4. Status post minimally invasive tricuspid valve repair 10/29/2019 5. ABLA  6. 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 SLE 8. ITP, on IVIG  Cont CRRT current settings.  AHF assisting with UF goals Rexene Agent , MD [facility]

## 2019-11-03 NOTE — Progress Notes (Signed)
EVENING ROUNDS NOTE :     [address]       [place] [address]             [phone]                 8 Days Post-Op Procedure(s) (LRB): MEDIASTERNAL WASH OUT,  ECMO DECANNULATION AND STERNAL CLOSURE (N/A) TRANSESOPHAGEAL ECHOCARDIOGRAM (TEE) (N/A)  Total Length of Stay:  LOS: 13 days  BP 104/64   Pulse (!) 117   Temp 97.6 F (36.4 C) (Oral)   Resp (!) 32   Ht 5\' 7"  (1.702 m)   Wt 77.8 kg   LMP 04/02/2017   SpO2 99%   BMI 26.86 kg/m   .Intake/Output      04/19 0701 - 04/20 0700 04/20 0701 - 04/21 0700   I.V. (mL/kg) 1286.7 (16.5) 243.4 (3.1)   Other     NG/GT 1020 330   IV Piggyback 270 135   Total Intake(mL/kg) 2576.7 (33.1) 708.4 (9.1)   Urine (mL/kg/hr) 35 (0) 0 (0)   Emesis/NG output 0    Other 5048 2305   Stool 450 0   Chest Tube 150 0   Total Output 5683 2305   Net -3106.3 -1596.6          .  prismasol BGK 4/2.5 500 mL/hr at 11/03/19 0810  .  prismasol BGK 4/2.5 300 mL/hr at 11/03/19 1821  . sodium chloride Stopped (10/27/19 1308)  . amiodarone 60 mg/hr (11/03/19 1816)  . amiodarone    . dextrose Stopped (10/30/19 0959)  . heparin 10,000 units/ 20 mL infusion syringe 500 Units/hr (11/03/19 1821)  . lacosamide (VIMPAT) IV Stopped (11/03/19 1030)  . levETIRAcetam Stopped (11/03/19 1221)  . milrinone 0.125 mcg/kg/min (11/03/19 1800)  . prismasol BGK 4/2.5 1,500 mL/hr at 11/03/19 1440  . propofol (DIPRIVAN) infusion Stopped (10/29/19 0415)     Lab Results  Component Value Date   WBC 11.4 (H) 11/03/2019   HGB 11.6 (L) 11/03/2019   HCT 34.0 (L) 11/03/2019   PLT 50 (L) 11/03/2019   GLUCOSE 88 11/03/2019   TRIG 84 10/28/2019   ALT 22 11/03/2019   AST 47 (H) 11/03/2019   NA 137 11/03/2019   K 4.6 11/03/2019   CL 102 11/03/2019   CREATININE 1.69 (H) 11/03/2019   BUN 41 (H) 11/03/2019   CO2 24 11/03/2019   INR 1.2 11/03/2019   HGBA1C 7.3 (H) 10/19/2019    1 issues with CRRT/Afib noted 2 conts support per family wishes,  possible comfort care soon with neuro status per neurology w/o hope for meaningful recovery John Giovanni, PA-C

## 2019-11-03 NOTE — Progress Notes (Signed)
Patient ID: [name], female   DOB: 1971-05-31, 49 y.o.   MRN: [id number] P    Advanced Heart Failure Rounding Note  PCP-Cardiologist: No primary care provider on file.   Subjective:    Events: - 4/7 Underwent mini TV repair c/b diffuse coagulopathy and severe bleeding.  Received RBCS, PLTs, TXA, cryo, Factor 7, steroids - 4/7 take back to OR for bleeding and [facility] ECMO placement  - 4/8 Repeat take back to OR for washout - 4/9 Back to OR for right chest hematoma removal. TEE in OR showed EF 40-45%, mild-moderately decreased RV systolic function, s/p TV repair with mild TR.  - 4/10 EEG with status epilepticus. Head CT negative.  - 4/12 ECMO decannulated and chest closed. Atrial fibrillation in OR with epinephrine.  - 4/15 IVIG started for thrombocytopenia. CT head with artifact but possible asymmetric hypodensity in right cerebellar hemisphere that could reflect hypoxic/ischemic injury.   Remains on vent. Now off Versed and Propofol since 4/15 and on phenobarbital IV for seizures.  Remains unresponsive.   Seen by Neurology yesterday with only minimal response to pain. Neuro prognostication remains poor. Family meeting yesterday and made DNR. However patient's daughter has a birthday on Thursday and want to hold off on transition to comfort care.   Remains on milrinone 0.125. On CVVH pulling - 200. Weight down 2 more pounds to 171 (pre-op 156). CVP 9 and co-ox 59%. CI 2.5 from FloTrac  Tmax 99.3  WBC 13.2k -> 16.3 -> 11.4. Off abx  Sputum culture 4/17 negative  Hgb 10.3 -> 9.0 this am. PLTs  83 => 29 => 31k => 44k +> 50k  She s/p 5 days IVIG - finished 4/19.   No overt bleeding, no anticoagulation   She remains in atrial flutter rate around 110-120 with amiodarone 60 mg/hr.     Objective:   Weight Range: 77.8 kg Body mass index is 26.86 kg/m.   Vital Signs:   Temp:  [96.6 F (35.9 C)-99.3 F (37.4 C)] 97.3 F (36.3 C) (04/20 0630) Pulse Rate:  [113-117] 113 (04/19  1512) Resp:  [8-39] 28 (04/20 0630) SpO2:  [83 %-100 %] 99 % (04/20 0630) Arterial Line BP: (79-120)/(51-75) 91/63 (04/20 0630) FiO2 (%):  [50 %-70 %] 50 % (04/20 0612) Weight:  [77.8 kg] 77.8 kg (04/20 0406) Last BM Date: 11/02/19  Weight change: Filed Weights   11/01/19 0451 11/02/19 0500 11/03/19 0406  Weight: 83.7 kg 78.6 kg 77.8 kg    Intake/Output:   Intake/Output Summary (Last 24 hours) at 11/03/2019 0655 Last data filed at 11/03/2019 0600 Gross per 24 hour  Intake 2606.79 ml  Output 5695 ml  Net -3088.21 ml      Physical Exam    General:  Intubated Unresponsive HEENT: normal + cor-trak Neck: supple.JVP 9. Carotids 2+ bilat; no bruits. No lymphadenopathy or thryomegaly appreciated. Cor: PMI nondisplaced. regular tachy No rubs, gallops or murmurs. Incisions ok Lungs: clear Abdomen: soft, nontender, nondistended. No hepatosplenomegaly. No bruits or masses. Good bowel sounds. Extremities: no cyanosis, clubbing, rash, tr edema Neuro: unresponsive I cannot get any response to pain    Telemetry   AFL  rate 110-120 (personally reviewed).    Labs    CBC Recent Labs    11/02/19 0500 11/02/19 0512 11/03/19 0355 11/03/19 0427  WBC 16.3*  --  11.4*  --   HGB 10.3*   < > 9.0* 9.9*  HCT 30.3*   < > 26.7* 29.0*  MCV 87.1  --  86.1  --   PLT 44*  --  50*  --    < > = values in this interval not displayed.   Basic Metabolic Panel Recent Labs    11/02/19 0500 11/02/19 0512 11/02/19 1538 11/02/19 1539 11/03/19 0355 11/03/19 0427  NA 134*   < > 132*   < > 132* 138  K 4.8   < > 4.6   < > 4.5 4.3  CL 99   < > 100  --  97*  --   CO2 25   < > 27  --  26  --   GLUCOSE 93   < > 160*  --  166*  --   BUN 39*   < > 41*  --  39*  --   CREATININE 1.61*   < > 1.60*  --  1.57*  --   CALCIUM 8.5*   < > 7.8*  --  8.2*  --   MG 2.6*  --   --   --  2.4  --   PHOS 2.6   < > 2.1*  --  2.5  --    < > = values in this interval not displayed.   Liver Function Tests Recent  Labs    11/02/19 0500 11/02/19 0500 11/02/19 1538 11/03/19 0355  AST 55*  --   --  47*  ALT 21  --   --  22  ALKPHOS 170*  --   --  165*  BILITOT 1.0  --   --  1.1  PROT 7.4  --   --  7.5  ALBUMIN 2.4*  2.4*   < > 2.1* 2.3*  2.3*   < > = values in this interval not displayed.   No results for input(s): LIPASE, AMYLASE in the last 72 hours. Cardiac Enzymes No results for input(s): CKTOTAL, CKMB, CKMBINDEX, TROPONINI in the last 72 hours.  BNP: BNP (last 3 results) Recent Labs    04/15/19 1101  BNP 248.7*    ProBNP (last 3 results) No results for input(s): PROBNP in the last 8760 hours.   D-Dimer No results for input(s): DDIMER in the last 72 hours. Hemoglobin A1C No results for input(s): HGBA1C in the last 72 hours. Fasting Lipid Panel No results for input(s): CHOL, HDL, LDLCALC, TRIG, CHOLHDL, LDLDIRECT in the last 72 hours. Thyroid Function Tests No results for input(s): TSH, T4TOTAL, T3FREE, THYROIDAB in the last 72 hours.  Invalid input(s): FREET3  Other results:   Imaging    No results found.   Medications:     Scheduled Medications: . bisacodyl  10 mg Oral Daily   Or  . bisacodyl  10 mg Rectal Daily  . chlorhexidine gluconate (MEDLINE KIT)  15 mL Mouth Rinse BID  . Chlorhexidine Gluconate Cloth  6 each Topical Daily  . feeding supplement (PIVOT 1.5 CAL)  1,000 mL Per Tube Q24H  . insulin aspart  0-24 Units Subcutaneous Q4H  . levothyroxine  50 mcg Per Tube Q0600  . mouth rinse  15 mL Mouth Rinse 10 times per day  . pantoprazole (PROTONIX) IV  40 mg Intravenous QHS  . PHENObarbital  160 mg Intravenous BID  . sodium chloride flush  10-40 mL Intracatheter Q12H    Infusions: .  prismasol BGK 4/2.5 500 mL/hr at 11/02/19 2156  .  prismasol BGK 4/2.5 300 mL/hr at 11/03/19 0125  . sodium chloride Stopped (10/27/19 1308)  . sodium chloride Stopped (10/30/19 0957)  . amiodarone 60  mg/hr (11/03/19 0600)  . dextrose Stopped (10/30/19 0959)  .  lacosamide (VIMPAT) IV Stopped (11/02/19 2152)  . levETIRAcetam Stopped (11/02/19 2327)  . milrinone 0.125 mcg/kg/min (11/03/19 0600)  . prismasol BGK 4/2.5 1,500 mL/hr at 11/03/19 0434  . propofol (DIPRIVAN) infusion Stopped (10/29/19 0415)    PRN Medications: sodium chloride, sodium chloride, acetaminophen (TYLENOL) oral liquid 160 mg/5 mL, artificial tears, dextrose, dextrose, diphenhydrAMINE, heparin, heparin, HYDROmorphone (DILAUDID) injection, LORazepam, metoprolol tartrate, midazolam, ondansetron (ZOFRAN) IV, sodium chloride flush     Assessment/Plan   1. Post-cardiotomy hemorrhagic shock - Patient with refractory post-operative chest bleeding s/p minimally invasive TV repair on 10/20/2019 in setting of longstanding SLE - has received  RBCS, PLTs, TXA, cryo, Factor 7, steroids - ECMO started in OR - Repeat OR visit for right chest clot removal on 4/9 with improved hemodynamics, TEE with EF 40-45%, mild-moderately decreased RV function, stable replaced TV.  - ECMO decannulated 4/12.  - Stable now w/o overt bleeding. Hgb 9.0  2. Acute systolic CHF, primarily RV failure with cardiogenic shock - CVP 9 this morning with co-ox 59%, FloTrak CI 2.5 - Weight down 47 pounds post-op peak. Still about 15 pounds from pre-op (156) - Continue to pull with CVVHD as hemodynamics tolerate. - Continue milrinone 0.125, off norepinephrine now and MAP stable despite atrial flutter.   - Hemodynamics are currently stable. RV seems to be functioning reasonably well. iNO off  3. 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  - plan as above  4. Post-operative respiratory failure - remains intubated, FiO2 0.5.   - Progressive airspace disease in R lung after mini thoracotomy, back to OR 4/9 for right chest clot removal.  - CCM following vent. - Off abx now, afebrile with normal WBCs.  - CXR 4/19 with no change in large right lung opacity - otherwise clear. Personally  reviewed  5. Acute blood loss anemia - Now stable.   6. 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 Last dose 4/19 - No overt bleeding, not anticoagulated. 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  - She is now on Vimpat and Keppra.  - With ongoing seizures, she is now off Propofol and Versed, started phenobarbital IV.  No further seizure activity.  - Head CT 4/15 with significant artifact but may show some anoxic injury.  Unable to get MRI due to retained wire.  - No awakening yet off sedation, now off since 4/15 hrs off sedation. Neurology following.  - Seen by Neurology yesterday with only minimal response to pain. Neuro prognostication remains poor. Family meeting yesterday and made DNR. However patient's daughter has a birthday on Thursday and want to hold off on transition to comfort care.    8. SLE - no change  9. AKI  - CVVH ongoing, pulling 200 cc/hr.  -Volume status improved but still over 15 pounds to go.  10. Nutrition - Tube feeds.  Well.  11. DVT prophylaxis - SCDs  12. Atrial fibrillation/flutter - Noted with epinephrine administration in OR 4/12.  - Atypical flutter on 4/13 but rate-controlled, then rapid atrial fibrillation after that.  - She is now in atrial flutter at rates around 110-120.  Fortunately hemodynamics are stable. - Not on anticoagulation yet with thrombocytopenia.    - Continue  amiodarone gtt No change.  13. Goals of care -> DNR - Palliative care is following. - She appears to have severe anoxic brain injury with very poor prognosis. Now DNR. Will continue supportive care for now. Potential move to comfort care by end of week. Appreciate Palliative Care involvement.   CRITICAL CARE Performed by: Glori Bickers  Total critical care time: 35  minutes  Critical care time was exclusive of separately billable procedures and treating other patients.  Critical care was necessary to treat or prevent imminent or life-threatening deterioration.  Critical care was time spent personally by me (independent of midlevel providers or residents) on the following activities: development of treatment plan with patient and/or surrogate as well as nursing, discussions with consultants, evaluation of patient's response to treatment, examination of patient, obtaining history from patient or surrogate, ordering and performing treatments and interventions, ordering and review of laboratory studies, ordering and review of radiographic studies, pulse oximetry and re-evaluation of patient's condition.    Length of Stay: [place], MD  11/03/2019, 6:55 AM  Advanced Heart Failure Team Pager [phone] (M-F; 7a - 4p)  Please contact [facility] for night-coverage after hours (4p -7a ) and weekends on [email or web address]

## 2019-11-03 NOTE — Progress Notes (Addendum)
TCTS DAILY ICU PROGRESS NOTE                   [address]            [place] [address]          [phone]   8 Days Post-Op Procedure(s) (LRB): MEDIASTERNAL WASH OUT,  ECMO DECANNULATION AND STERNAL CLOSURE (N/A) TRANSESOPHAGEAL ECHOCARDIOGRAM (TEE) (N/A)  Total Length of Stay:  LOS: 13 days   Subjective: On vent, unresponsive.  Amiodarone infusing at 39m/hr. TF infusing at 351mhr Nitric Oxide off.   Objective: Vital signs in last 24 hours: Temp:  [96.6 F (35.9 C)-99.3 F (37.4 C)] 97 F (36.1 C) (04/20 0700) Pulse Rate:  [113-116] 113 (04/19 1512) Cardiac Rhythm: Atrial flutter (04/19 2000) Resp:  [8-39] 29 (04/20 0700) SpO2:  [83 %-100 %] 98 % (04/20 0700) Arterial Line BP: (79-120)/(51-75) 88/58 (04/20 0700) FiO2 (%):  [50 %-70 %] 50 % (04/20 0612) Weight:  [77.8 kg] 77.8 kg (04/20 0406)  Filed Weights   11/01/19 0451 11/02/19 0500 11/03/19 0406  Weight: 83.7 kg 78.6 kg 77.8 kg    Weight change: -0.8 kg   Hemodynamic parameters for last 24 hours: CVP:  [9 mmHg-64 mmHg] 10 mmHg  Intake/Output from previous day: 04/19 0701 - 04/20 0700 In: 2576.7 [I.V.:1286.7; NG/GT:1020; IV Piggyback:270] Out: 5683 [Urine:35; Stool:450; Chest Tube:150]  Intake/Output this shift: Total I/O In: -  Out: 287 [Other:287]  Current Meds: Scheduled Meds: . bisacodyl  10 mg Oral Daily   Or  . bisacodyl  10 mg Rectal Daily  . chlorhexidine gluconate (MEDLINE KIT)  15 mL Mouth Rinse BID  . Chlorhexidine Gluconate Cloth  6 each Topical Daily  . feeding supplement (PIVOT 1.5 CAL)  1,000 mL Per Tube Q24H  . insulin aspart  0-24 Units Subcutaneous Q4H  . levothyroxine  50 mcg Per Tube Q0600  . mouth rinse  15 mL Mouth Rinse 10 times per day  . pantoprazole (PROTONIX) IV  40 mg Intravenous QHS  . PHENObarbital  160 mg Intravenous BID  . sodium chloride flush  10-40 mL Intracatheter Q12H   Continuous Infusions: .  prismasol BGK 4/2.5 500 mL/hr at 11/02/19  2156  .  prismasol BGK 4/2.5 300 mL/hr at 11/03/19 0125  . sodium chloride Stopped (10/27/19 1308)  . sodium chloride Stopped (10/30/19 0957)  . amiodarone 60 mg/hr (11/03/19 0700)  . dextrose Stopped (10/30/19 0959)  . lacosamide (VIMPAT) IV Stopped (11/02/19 2152)  . levETIRAcetam Stopped (11/02/19 2327)  . milrinone 0.125 mcg/kg/min (11/03/19 0700)  . prismasol BGK 4/2.5 1,500 mL/hr at 11/03/19 0800  . propofol (DIPRIVAN) infusion Stopped (10/29/19 0415)   PRN Meds:.sodium chloride, sodium chloride, acetaminophen (TYLENOL) oral liquid 160 mg/5 mL, artificial tears, dextrose, dextrose, diphenhydrAMINE, heparin, heparin, HYDROmorphone (DILAUDID) injection, LORazepam, metoprolol tartrate, midazolam, ondansetron (ZOFRAN) IV, sodium chloride flush  General appearance: On vent, remains unresponsive, off sedation for several days.  Heart: atrial flutter, VR 100-120. Lungs: Breath sounds coarse Extremities: Warm and well perfused.  Wound: the right chest and sternotomy incisions are intact and dry.   Lab Results: CBC: Recent Labs    11/02/19 0500 11/02/19 0512 11/03/19 0355 11/03/19 0427  WBC 16.3*  --  11.4*  --   HGB 10.3*   < > 9.0* 9.9*  HCT 30.3*   < > 26.7* 29.0*  PLT 44*  --  50*  --    < > = values in this interval not displayed.  BMET:  Recent Labs    11/02/19 1538 11/02/19 1539 11/03/19 0355 11/03/19 0427  NA 132*   < > 132* 138  K 4.6   < > 4.5 4.3  CL 100  --  97*  --   CO2 27  --  26  --   GLUCOSE 160*  --  166*  --   BUN 41*  --  39*  --   CREATININE 1.60*  --  1.57*  --   CALCIUM 7.8*  --  8.2*  --    < > = values in this interval not displayed.    CMET: Lab Results  Component Value Date   WBC 11.4 (H) 11/03/2019   HGB 9.9 (L) 11/03/2019   HCT 29.0 (L) 11/03/2019   PLT 50 (L) 11/03/2019   GLUCOSE 166 (H) 11/03/2019   TRIG 84 10/28/2019   ALT 22 11/03/2019   AST 47 (H) 11/03/2019   NA 138 11/03/2019   K 4.3 11/03/2019   CL 97 (L) 11/03/2019    CREATININE 1.57 (H) 11/03/2019   BUN 39 (H) 11/03/2019   CO2 26 11/03/2019   INR 1.2 11/03/2019   HGBA1C 7.3 (H) 10/19/2019      PT/INR:  Recent Labs    11/03/19 0355  LABPROT 15.3*  INR 1.2   Radiology: No results found.   Assessment/Plan: S/P Procedure(s) (LRB): MEDIASTERNAL WASH OUT,  ECMO DECANNULATION AND STERNAL CLOSURE (N/A) TRANSESOPHAGEAL ECHOCARDIOGRAM (TEE) (N/A)  -POD 13 complex tricuspid valve repair with perioperative course complicated by profound coagulopathy, bleeding, v-fib arrest, cardio-respiratory failure requiring ECMO, and convulsive status epilepticus.  Appreciate re-evaluation by neurology and input from the palliative care team. Pt has been designated DNR since neurology has determined meaningful neurological recovery is unlikely.  Per family wishes, plan to continue supportive care for now and transition to comfort care later this week in respect of Ms. Branscomb's daughter's birthday (which is today).   Will d/c remaining chest tube, Foley catheter. Reduce the amiodarone to 30mg/hr.    Myron G. Roddenberry, PA-C [phone] 11/03/2019 8:10 AM   I have seen and examined the patient and agree with the assessment and plan as outlined.   H , MD 11/03/2019     

## 2019-11-03 NOTE — Progress Notes (Signed)
Discussed removing last mediastinal tube and foley catheter with Dr. Roxy Manns this morning. Verbal orders received to remove MT and also foley (patient is not making urine). RN attempted to remove MT but was met with a lot of resistance and was unable to remove tube successfully. Myron, PA was on the unit and with some manual manipulation was able to remove the MT. Patient remained stable throughout.  Joellen Jersey, RN

## 2019-11-03 NOTE — Progress Notes (Signed)
Patient's amio drip decreased from 60mg /hr to 30mg /hr this AM. Patient's HR has been steadily increasing from 112-115 to 119-121. She remains in A.flutter. Rosaria Ferries, PA with cardiology notified who gave orders to increase dose to 60mg /hr. She also ordered a 150mg  bolus, but stated it was ok to hold off on giving that for the time being due to low BP's. Stated to administered if rate goes higher or if patient's BP becomes stable enough to tolerate. Will closely monitor.  Joellen Jersey, RN

## 2019-11-03 NOTE — Plan of Care (Signed)
  Problem: Cardiac: Goal: Will achieve and/or maintain hemodynamic stability Outcome: Progressing   Problem: Clinical Measurements: Goal: Ability to maintain clinical measurements within normal limits will improve Outcome: Progressing   Problem: Nutrition: Goal: Adequate nutrition will be maintained Outcome: Progressing   Problem: Respiratory: Goal: Ability to maintain a clear airway and adequate ventilation will improve Outcome: Progressing   Problem: Activity: Goal: Risk for activity intolerance will decrease Outcome: Not Progressing   Problem: Urinary Elimination: Goal: Ability to achieve and maintain adequate renal perfusion and functioning will improve Outcome: Not Progressing   Problem: Activity: Goal: Ability to tolerate increased activity will improve Outcome: Not Progressing

## 2019-11-03 NOTE — Progress Notes (Addendum)
Palliative Medicine RN Note: Rec'd request from PMT PA Florentina Jenny to assist with setting up [name]'s children in [place] through [employer], aka [facility]   I called the office, and only the children's caregiver can set them up with [facility]. [name] will have to call and set up the enrollment. At this time, all [facility] counseling is still via [employer], and the earliest availability is in May.  I passed this information on to the PMT provider who will be following up tomorrow with [name].  Marjie Skiff Eyvonne Burchfield, RN, BSN, [facility] Palliative Medicine Team 11/03/2019 9:20 AM Office [phone]

## 2019-11-03 NOTE — Progress Notes (Signed)
RT called to patient room due patient having a cuff leak.  Upon arrival patient's VT noted to be reading in mid 200s.  Added air to cuff and volumes returned to normal however RT noted that volumes were slowly decreasing back down after a minute or so.  MD made aware however due to patient's current condition would like to try other methods for maintaining air in cuff.  Placed stopcock on end of pilot balloon and patient is maintaining tidal volumes.  Will continue to monitor.

## 2019-11-04 ENCOUNTER — Inpatient Hospital Stay (HOSPITAL_COMMUNITY): Payer: BC Managed Care – PPO

## 2019-11-04 DIAGNOSIS — R092 Respiratory arrest: Secondary | ICD-10-CM | POA: Diagnosis not present

## 2019-11-04 DIAGNOSIS — Z66 Do not resuscitate: Secondary | ICD-10-CM | POA: Diagnosis not present

## 2019-11-04 DIAGNOSIS — I50813 Acute on chronic right heart failure: Secondary | ICD-10-CM | POA: Diagnosis not present

## 2019-11-04 DIAGNOSIS — Z978 Presence of other specified devices: Secondary | ICD-10-CM

## 2019-11-04 LAB — RENAL FUNCTION PANEL
Albumin: 2.3 g/dL — ABNORMAL LOW (ref 3.5–5.0)
Albumin: 2.3 g/dL — ABNORMAL LOW (ref 3.5–5.0)
Anion gap: 7 (ref 5–15)
Anion gap: 10 (ref 5–15)
BUN: 38 mg/dL — ABNORMAL HIGH (ref 6–20)
BUN: 35 mg/dL — ABNORMAL HIGH (ref 6–20)
CO2: 26 mmol/L (ref 22–32)
CO2: 25 mmol/L (ref 22–32)
Calcium: 8.5 mg/dL — ABNORMAL LOW (ref 8.9–10.3)
Calcium: 8.2 mg/dL — ABNORMAL LOW (ref 8.9–10.3)
Chloride: 104 mmol/L (ref 98–111)
Chloride: 103 mmol/L (ref 98–111)
Creatinine, Ser: 1.73 mg/dL — ABNORMAL HIGH (ref 0.44–1.00)
Creatinine, Ser: 1.63 mg/dL — ABNORMAL HIGH (ref 0.44–1.00)
GFR calc Af Amer: 40 mL/min — ABNORMAL LOW (ref >60)
GFR calc Af Amer: 43 mL/min — ABNORMAL LOW (ref >60)
GFR calc non Af Amer: 35 mL/min — ABNORMAL LOW (ref >60)
GFR calc non Af Amer: 37 mL/min — ABNORMAL LOW (ref >60)
Glucose, Bld: 98 mg/dL (ref 70–99)
Glucose, Bld: 118 mg/dL — ABNORMAL HIGH (ref 70–99)
Phosphorus: 2.2 mg/dL — ABNORMAL LOW (ref 2.5–4.6)
Phosphorus: 3.8 mg/dL (ref 2.5–4.6)
Potassium: 4.5 mmol/L (ref 3.5–5.1)
Potassium: 4.4 mmol/L (ref 3.5–5.1)
Sodium: 137 mmol/L (ref 135–145)
Sodium: 138 mmol/L (ref 135–145)

## 2019-11-04 LAB — HEPATIC FUNCTION PANEL
ALT: 23 U/L (ref 0–44)
AST: 49 U/L — ABNORMAL HIGH (ref 15–41)
Albumin: 2.3 g/dL — ABNORMAL LOW (ref 3.5–5.0)
Alkaline Phosphatase: 169 U/L — ABNORMAL HIGH (ref 38–126)
Bilirubin, Direct: 0.7 mg/dL — ABNORMAL HIGH (ref 0.0–0.2)
Indirect Bilirubin: 0.6 mg/dL (ref 0.3–0.9)
Total Bilirubin: 1.3 mg/dL — ABNORMAL HIGH (ref 0.3–1.2)
Total Protein: 7.5 g/dL (ref 6.5–8.1)

## 2019-11-04 LAB — COOXEMETRY PANEL
Carboxyhemoglobin: 1.8 % — ABNORMAL HIGH (ref 0.5–1.5)
Methemoglobin: 0.9 % (ref 0.0–1.5)
O2 Saturation: 54.4 %
Total hemoglobin: 9.3 g/dL — ABNORMAL LOW (ref 12.0–16.0)

## 2019-11-04 LAB — POCT I-STAT 7, (LYTES, BLD GAS, ICA,H+H)
Acid-Base Excess: 5 mmol/L — ABNORMAL HIGH (ref 0.0–2.0)
Bicarbonate: 28.2 mmol/L — ABNORMAL HIGH (ref 20.0–28.0)
Calcium, Ion: 1.18 mmol/L (ref 1.15–1.40)
HCT: 30 % — ABNORMAL LOW (ref 36.0–46.0)
Hemoglobin: 10.2 g/dL — ABNORMAL LOW (ref 12.0–15.0)
O2 Saturation: 99 %
Patient temperature: 97.8
Potassium: 4.4 mmol/L (ref 3.5–5.1)
Sodium: 137 mmol/L (ref 135–145)
TCO2: 29 mmol/L (ref 22–32)
pCO2 arterial: 36.7 mmHg (ref 32.0–48.0)
pH, Arterial: 7.492 — ABNORMAL HIGH (ref 7.350–7.450)
pO2, Arterial: 120 mmHg — ABNORMAL HIGH (ref 83.0–108.0)

## 2019-11-04 LAB — GLUCOSE, CAPILLARY
Glucose-Capillary: 103 mg/dL — ABNORMAL HIGH (ref 70–99)
Glucose-Capillary: 88 mg/dL (ref 70–99)
Glucose-Capillary: 89 mg/dL (ref 70–99)
Glucose-Capillary: 96 mg/dL (ref 70–99)
Glucose-Capillary: 111 mg/dL — ABNORMAL HIGH (ref 70–99)
Glucose-Capillary: 102 mg/dL — ABNORMAL HIGH (ref 70–99)
Glucose-Capillary: 126 mg/dL — ABNORMAL HIGH (ref 70–99)

## 2019-11-04 LAB — MAGNESIUM: Magnesium: 2.6 mg/dL — ABNORMAL HIGH (ref 1.7–2.4)

## 2019-11-04 LAB — CBC
HCT: 25.8 % — ABNORMAL LOW (ref 36.0–46.0)
Hemoglobin: 9.1 g/dL — ABNORMAL LOW (ref 12.0–15.0)
MCH: 29.1 pg (ref 26.0–34.0)
MCHC: 35.3 g/dL (ref 30.0–36.0)
MCV: 82.4 fL (ref 80.0–100.0)
Platelets: 78 10*3/uL — ABNORMAL LOW (ref 150–400)
RBC: 3.13 MIL/uL — ABNORMAL LOW (ref 3.87–5.11)
RDW: 18.1 % — ABNORMAL HIGH (ref 11.5–15.5)
WBC: 10 10*3/uL (ref 4.0–10.5)
nRBC: 0.2 % (ref 0.0–0.2)

## 2019-11-04 LAB — PROTIME-INR
INR: 1.3 — ABNORMAL HIGH (ref 0.8–1.2)
Prothrombin Time: 15.8 seconds — ABNORMAL HIGH (ref 11.4–15.2)

## 2019-11-04 LAB — APTT: aPTT: 52 seconds — ABNORMAL HIGH (ref 24–36)

## 2019-11-04 MED ORDER — SODIUM PHOSPHATES 45 MMOLE/15ML IV SOLN
30.0000 mmol | Freq: Once | INTRAVENOUS | Status: AC
  Administered 2019-11-04: 30 mmol via INTRAVENOUS
  Filled 2019-11-04: qty 10

## 2019-11-04 MED ORDER — PRO-STAT SUGAR FREE PO LIQD
30.0000 mL | Freq: Four times a day (QID) | ORAL | Status: DC
  Administered 2019-11-04 – 2019-11-07 (×11): 30 mL
  Filled 2019-11-04 (×11): qty 30

## 2019-11-04 NOTE — Procedures (Signed)
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; s/p minimmally invasive TV repair, hemorrhagic / cardiogenic shock postoperative, status epiletpicus, TCP on IVIG  Current CRRT Prescription: Catheter: R IJ Temp HD cath BFR: 200 Pre Blood Pump: 500 4K DFR: 1500 4K Replacement Rate: 300 4K Goal UF: 240m / h net neg Anticoagulation: fixed dose heparin 500u/h; AM pTT 52 Clotting: about 1x/24h  S:  Started clotting yesterday, begian heparin in CRRT circuit  K 4.4, P 2.2  Anuric  Tol UF, 3.1L neg yesterday  O: 04/20 0701 - 04/21 0700 In: 1755.7 [I.V.:733; NG/GT:720; IV Piggyback:280.7] Out: 4833   Filed Weights   11/02/19 0500 11/03/19 0406 11/04/19 0500  Weight: 78.6 kg 77.8 kg 76.3 kg    Recent Labs  Lab 11/03/19 0355 11/03/19 0427 11/03/19 1629 11/03/19 1629 11/03/19 1736 11/04/19 0433 11/04/19 0436  NA 132*   < > 134*   < > 137 137 137  K 4.5   < > 4.6   < > 4.6 4.5 4.4  CL 97*  --  102  --   --  104  --   CO2 26  --  24  --   --  26  --   GLUCOSE 166*  --  88  --   --  98  --   BUN 39*  --  41*  --   --  38*  --   CREATININE 1.57*  --  1.69*  --   --  1.73*  --   CALCIUM 8.2*  --  8.3*  --   --  8.5*  --   PHOS 2.5  --  2.8  --   --  2.2*  --    < > = values in this interval not displayed.   Recent Labs  Lab 11/02/19 0500 11/02/19 0512 11/03/19 0355 11/03/19 0427 11/03/19 1736 11/04/19 0433 11/04/19 0436  WBC 16.3*  --  11.4*  --   --  10.0  --   HGB 10.3*   < > 9.0*   < > 11.6* 9.1* 10.2*  HCT 30.3*   < > 26.7*   < > 34.0* 25.8* 30.0*  MCV 87.1  --  86.1  --   --  82.4  --   PLT 44*  --  50*  --   --  78*  --    < > = values in this interval not displayed.    Scheduled Meds: . bisacodyl  10 mg Oral Daily   Or  . bisacodyl  10 mg Rectal Daily  . chlorhexidine gluconate (MEDLINE KIT)  15 mL Mouth Rinse BID  . Chlorhexidine Gluconate Cloth  6 each Topical Daily  . feeding supplement (PIVOT 1.5 CAL)   1,000 mL Per Tube Q24H  . insulin aspart  0-24 Units Subcutaneous Q4H  . levothyroxine  50 mcg Per Tube Q0600  . mouth rinse  15 mL Mouth Rinse 10 times per day  . pantoprazole (PROTONIX) IV  40 mg Intravenous QHS  . PHENObarbital  160 mg Intravenous BID  . sodium chloride flush  10-40 mL Intracatheter Q12H   Continuous Infusions: .  prismasol BGK 4/2.5 500 mL/hr at 11/04/19 05681 .  prismasol BGK 4/2.5 300 mL/hr at 11/03/19 1821  . sodium chloride Stopped (10/27/19 1308)  . amiodarone 60 mg/hr (11/04/19 0800)  . amiodarone    . dextrose Stopped (10/30/19 0959)  . heparin 10,000 units/ 20 mL infusion  syringe 500 Units/hr (11/04/19 0800)  . lacosamide (VIMPAT) IV Stopped (11/03/19 2225)  . levETIRAcetam Stopped (11/04/19 0039)  . milrinone 0.125 mcg/kg/min (11/04/19 0800)  . prismasol BGK 4/2.5 1,500 mL/hr at 11/04/19 0725  . propofol (DIPRIVAN) infusion Stopped (10/29/19 0415)   PRN Meds:.sodium chloride, artificial tears, dextrose, dextrose, heparin, heparin, HYDROmorphone (DILAUDID) injection, LORazepam, metoprolol tartrate, midazolam, ondansetron (ZOFRAN) IV, sodium chloride flush  ABG    Component Value Date/Time   PHART 7.492 (H) 11/04/2019 0436   PCO2ART 36.7 11/04/2019 0436   PO2ART 120.0 (H) 11/04/2019 0436   HCO3 28.2 (H) 11/04/2019 0436   TCO2 29 11/04/2019 0436   ACIDBASEDEF 1.0 10/28/2019 1841   O2SAT 54.4 11/04/2019 0440    A/P  1. 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 Shock, cardiogenic and hemorrhagic, on inotrope; significant RV failure 3. VDRF. Inhaled NO2 per CCM / AHF / TCTS 4. Status post minimally invasive tricuspid valve repair 10/27/2019 5. ABLA  6. 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 SLE 8. ITP, on IVIG  Cont CRRT current settings.  Seems clottign improved with fixed dose heparin in CRRT circuit.  HF assisting with UF goals  Rexene Agent , MD [facility]

## 2019-11-04 NOTE — Progress Notes (Signed)
Patient ID: [name], female   DOB: 03-Oct-1970, 49 y.o.   MRN: [id number] P    Advanced Heart Failure Rounding Note  PCP-Cardiologist: No primary care provider on file.   Subjective:    Events: - 4/7 Underwent mini TV repair c/b diffuse coagulopathy and severe bleeding.  Received RBCS, PLTs, TXA, cryo, Factor 7, steroids - 4/7 take back to OR for bleeding and [facility] ECMO placement  - 4/8 Repeat take back to OR for washout - 4/9 Back to OR for right chest hematoma removal. TEE in OR showed EF 40-45%, mild-moderately decreased RV systolic function, s/p TV repair with mild TR.  - 4/10 EEG with status epilepticus. Head CT negative.  - 4/12 ECMO decannulated and chest closed. Atrial fibrillation in OR with epinephrine.  - 4/15 IVIG started for thrombocytopenia. CT head with artifact but possible asymmetric hypodensity in right cerebellar hemisphere that could reflect hypoxic/ischemic injury.   Remains on vent. Now off Versed and Propofol since 4/15 and on phenobarbital IV for seizures.  Remains completely unresponsive.   Remains on milrinone 0.125. On CVVH pulling - 150. Weight down 3 more pounds to 168 (pre-op 156). CVP 11-12  and co-ox 54%. CI 2.1 -2.5 from FloTrac  Tmax 98.8  WBC 10k Off abx  Sputum culture 4/17 negative  Hgb stable at 9.1 this am. PLTs  78K  She s/p 5 days IVIG - finished 4/19.   No overt bleeding  She remains in atrial flutter rates 110-120 with amiodarone 60 mg/hr.     Objective:   Weight Range: 76.3 kg Body mass index is 26.35 kg/m.   Vital Signs:   Temp:  [97.5 F (36.4 C)-98.8 F (37.1 C)] 98.2 F (36.8 C) (04/21 0730) Pulse Rate:  [116-118] 118 (04/21 0800) Resp:  [18-38] 28 (04/21 0800) BP: (99-108)/(60-67) 108/67 (04/21 0800) SpO2:  [97 %-100 %] 97 % (04/21 0800) Arterial Line BP: (72-112)/(47-71) 108/67 (04/21 0800) FiO2 (%):  [50 %] 50 % (04/21 0800) Weight:  [76.3 kg] 76.3 kg (04/21 0500) Last BM Date: 11/03/19  Weight change: Filed  Weights   11/02/19 0500 11/03/19 0406 11/04/19 0500  Weight: 78.6 kg 77.8 kg 76.3 kg    Intake/Output:   Intake/Output Summary (Last 24 hours) at 11/04/2019 0909 Last data filed at 11/04/2019 0800 Gross per 24 hour  Intake 1702.47 ml  Output 4519 ml  Net -2816.53 ml      Physical Exam    General:  Intubated Unresponsive HEENT: normal + cor-trak Neck: supple. LIJ cath Carotids 2+ bilat; no bruits. No lymphadenopathy or thryomegaly appreciated. Cor: PMI nondisplaced. Regular rate & rhythm. No rubs, gallops or murmurs. Incisions ok Lungs: clear Abdomen: soft, nontender, nondistended. No hepatosplenomegaly. No bruits or masses. Good bowel sounds. Extremities: no cyanosis, clubbing, rash, trace edema Neuro: unresponsive. Does not w/d to pain.  Telemetry   AFL  rate 110-120 (Personally reviewed)   Labs    CBC Recent Labs    11/03/19 0355 11/03/19 0427 11/04/19 0433 11/04/19 0436  WBC 11.4*  --  10.0  --   HGB 9.0*   < > 9.1* 10.2*  HCT 26.7*   < > 25.8* 30.0*  MCV 86.1  --  82.4  --   PLT 50*  --  78*  --    < > = values in this interval not displayed.   Basic Metabolic Panel Recent Labs    11/03/19 0355 11/03/19 0427 11/03/19 1629 11/03/19 1736 11/04/19 0433 11/04/19 0436  NA 132*   < >  134*   < > 137 137  K 4.5   < > 4.6   < > 4.5 4.4  CL 97*   < > 102  --  104  --   CO2 26   < > 24  --  26  --   GLUCOSE 166*   < > 88  --  98  --   BUN 39*   < > 41*  --  38*  --   CREATININE 1.57*   < > 1.69*  --  1.73*  --   CALCIUM 8.2*   < > 8.3*  --  8.5*  --   MG 2.4  --   --   --  2.6*  --   PHOS 2.5   < > 2.8  --  2.2*  --    < > = values in this interval not displayed.   Liver Function Tests Recent Labs    11/03/19 0355 11/03/19 0355 11/03/19 1629 11/04/19 0433  AST 47*  --   --  49*  ALT 22  --   --  23  ALKPHOS 165*  --   --  169*  BILITOT 1.1  --   --  1.3*  PROT 7.5  --   --  7.5  ALBUMIN 2.3*  2.3*   < > 2.3* 2.3*  2.3*   < > = values in this  interval not displayed.   No results for input(s): LIPASE, AMYLASE in the last 72 hours. Cardiac Enzymes No results for input(s): CKTOTAL, CKMB, CKMBINDEX, TROPONINI in the last 72 hours.  BNP: BNP (last 3 results) Recent Labs    04/15/19 1101  BNP 248.7*    ProBNP (last 3 results) No results for input(s): PROBNP in the last 8760 hours.   D-Dimer No results for input(s): DDIMER in the last 72 hours. Hemoglobin A1C No results for input(s): HGBA1C in the last 72 hours. Fasting Lipid Panel No results for input(s): CHOL, HDL, LDLCALC, TRIG, CHOLHDL, LDLDIRECT in the last 72 hours. Thyroid Function Tests No results for input(s): TSH, T4TOTAL, T3FREE, THYROIDAB in the last 72 hours.  Invalid input(s): FREET3  Other results:   Imaging    No results found.   Medications:     Scheduled Medications: . bisacodyl  10 mg Oral Daily   Or  . bisacodyl  10 mg Rectal Daily  . chlorhexidine gluconate (MEDLINE KIT)  15 mL Mouth Rinse BID  . Chlorhexidine Gluconate Cloth  6 each Topical Daily  . feeding supplement (PIVOT 1.5 CAL)  1,000 mL Per Tube Q24H  . insulin aspart  0-24 Units Subcutaneous Q4H  . levothyroxine  50 mcg Per Tube Q0600  . mouth rinse  15 mL Mouth Rinse 10 times per day  . pantoprazole (PROTONIX) IV  40 mg Intravenous QHS  . PHENObarbital  160 mg Intravenous BID  . sodium chloride flush  10-40 mL Intracatheter Q12H    Infusions: .  prismasol BGK 4/2.5 500 mL/hr at 11/04/19 1324  .  prismasol BGK 4/2.5 300 mL/hr at 11/03/19 1821  . sodium chloride Stopped (10/27/19 1308)  . amiodarone 60 mg/hr (11/04/19 0800)  . amiodarone    . dextrose Stopped (10/30/19 0959)  . heparin 10,000 units/ 20 mL infusion syringe 500 Units/hr (11/04/19 0800)  . lacosamide (VIMPAT) IV Stopped (11/03/19 2225)  . levETIRAcetam Stopped (11/04/19 0039)  . milrinone 0.125 mcg/kg/min (11/04/19 0800)  . prismasol BGK 4/2.5 1,500 mL/hr at 11/04/19 0725  . propofol (  DIPRIVAN)  infusion Stopped (10/29/19 0415)  . sodium phosphate  Dextrose 5% IVPB      PRN Medications: sodium chloride, artificial tears, dextrose, dextrose, heparin, heparin, HYDROmorphone (DILAUDID) injection, LORazepam, metoprolol tartrate, midazolam, ondansetron (ZOFRAN) IV, sodium chloride flush     Assessment/Plan   1. Post-cardiotomy hemorrhagic shock - Patient with refractory post-operative chest bleeding s/p minimally invasive TV repair on 11/11/2019 in setting of longstanding SLE - has received  RBCS, PLTs, TXA, cryo, Factor 7, steroids - ECMO started in OR - Repeat OR visit for right chest clot removal on 4/9 with improved hemodynamics, TEE with EF 40-45%, mild-moderately decreased RV function, stable replaced TV.  - ECMO decannulated 4/12.  - Stable now w/o overt bleeding. Hgb 10.2  2. Acute systolic CHF, primarily RV failure with cardiogenic shock - CVP 11 this morning with co-ox 54%, FloTrak CI 2.1-2.5 - Weight down 50 pounds post-op peak. Still about 12 pounds from pre-op (156) - Continue to pull with CVVHD as hemodynamics tolerate. Now at -150. May need to cut back - Continue milrinone 0.125, off norepinephrine now and MAP stable despite atrial flutter.   - Hemodynamics are currently stable. RV seems to be functioning reasonably well. iNO off - No major change  3. 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  - plan as above  4. Post-operative respiratory failure - remains intubated, FiO2 0.5.   - Progressive airspace disease in R lung after mini thoracotomy, back to OR 4/9 for right chest clot removal.  - CCM following vent - Off abx. AF. WBC ok  - CXR 4/19 with no change in large right lung opacity - otherwise clear. Personally reviewed  5. Acute blood loss anemia - Now stable.   6. 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  - HIT negative - Plts 29K -> 31k -> 44k  => 50k =>  78k  Received IVIG x 5 days for potential ITP. Last dose 4/19 - No overt bleeding, not anticoagulated. 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  - She is now on Vimpat and Keppra.  - With ongoing seizures, she is now off Propofol and Versed, started phenobarbital IV.  No further seizure activity.  - Head CT 4/15 with significant artifact but may show some anoxic injury.  Unable to get MRI due to retained wire.  - No awakening yet off sedation, now off since 4/15 hrs off sedation. Neurology following.  - Seen by Neurology this week with only minimal response to pain. Neuro prognostication remains poor. Family meeting this wek and made DNR. However patient's daughter has a birthday on Thursday and want to hold off on transition to comfort care.  Will continue current level of care  8. SLE - no change  9. AKI  - CVVH ongoing, pulling -150 cc/hr.  - Can cut back as needed. D/w Renal  10. Nutrition - Tube feeds. Tolerated well  11. DVT prophylaxis - SCDs  12. Atrial fibrillation/flutter - She remains in atrial flutter at rates around 110-120.  Fortunately hemodynamics are stable. - Tolerating low-dose heparin    - Can drop amio to 30/hr  13. Goals of care -> DNR - Palliative care is following. - She appears to have severe anoxic brain injury with very poor prognosis. Now DNR. Will continue supportive care for now. Potential move to comfort care by end of week. Appreciate Palliative Care involvement.   CRITICAL  CARE Performed by: Glori Bickers  Total critical care time: 35 minutes  Critical care time was exclusive of separately billable procedures and treating other patients.  Critical care was necessary to treat or prevent imminent or life-threatening deterioration.  Critical care was time spent personally by me (independent of midlevel providers or residents) on the following activities: development of treatment plan with patient and/or surrogate  as well as nursing, discussions with consultants, evaluation of patient's response to treatment, examination of patient, obtaining history from patient or surrogate, ordering and performing treatments and interventions, ordering and review of laboratory studies, ordering and review of radiographic studies, pulse oximetry and re-evaluation of patient's condition.    Length of Stay: [place], MD  11/04/2019, 9:09 AM  Advanced Heart Failure Team Pager [phone] (M-F; 7a - 4p)  Please contact [facility] for night-coverage after hours (4p -7a ) and weekends on [email or web address]

## 2019-11-04 NOTE — Progress Notes (Signed)
NAME:  [name], MRN:  [id number], DOB:  05/29/1971, LOS: 46 ADMISSION DATE:  10/27/2019, CONSULTATION DATE:  11/04/2019  REFERRING MD:  Rexene Alberts, MD, CHIEF COMPLAINT:  Vent management.  History of present illness   The patient is a 49 year old woman who is POD 10 from minimally invasive tricuspid valve repair which was then converted into an open procedure.  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  Being followed by the HF team for evaluation of PAH, likely group 1.  However despite echocardiograms showing severely elevated RA pressures, she has had 2 right heart cath that demonstrated normal peripheral vascular resistance for Group 1 PAH.  She was taken to the OR by Dr. Roxy Manns on 4/7 given her persistent symptoms for repair of severe tricuspid regurgitation.  4/8, she had reexploration of her median sternotomy with concern for postop bleeding.  There was a large amount of clot evacuated from the anterior mediastinum and significant oozing diffusely.  Does not appear that there were any significant signs for mechanical active bleeding.  Bili at that time she was cannulated for ECMO and started on [place] ECMO on 4/8.  Since then she has had substantial correction of her coagulopathy including Kcentra, FEIBA, cryoprecipitate, FFP and copious blood products given her substantial bleeding in the mediastinum.  She went for decannulation from [facility] ECMO on 4/12. She remains critically ill with recurrent seizure like activity from a presumed anoxic brain injury.   PCCM is being consulted to assist with vent management.  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   Consults:  Heart failure, PCCM, Palliative, Neurology  Procedures:  4/7 tricuspid valve repair through median sternotomy 4/8 reexploration of the mediastinum with cannulation for ECMO 4/12 decannulation from [facility] ECMO  4/14: HD cath right IJ  Significant Diagnostic Tests:  Transesophageal echocardiogram January 2021  1. Left ventricular ejection fraction, by visual estimation, is 60 to  65%. The left ventricle has normal function. There is no left ventricular  hypertrophy.  2. The left ventricle has no regional wall motion abnormalities.  3. Global right ventricle has mildly reduced systolic function.The right  ventricular size is normal. No increase in right ventricular wall  thickness.  4. Left atrial size was moderately dilated.  5. Right atrial size was severely dilated.  6. The mitral valve is normal in structure. Trivial mitral valve  regurgitation.  7. The tricuspid valve is abnormal.  8. The tricuspid valve is abnormal. Tricuspid valve regurgitation is  severe.  9. Apparent mild restriction of septal leaflet. RVSP ~ 19mmHG.  10. The aortic valve is normal in structure. Aortic valve regurgitation is  not visualized.  11. The pulmonic valve was grossly normal. Pulmonic valve regurgitation is  not visualized.  12. Mild plaque invoving the descending aorta.  13. Severely elevated pulmonary artery systolic pressure.   Right heart catheterization June 2020 Findings:  RA = 5 RV = 34/7 PA =  34/6 (20) PCW = 8 Fick cardiac output/index = 5.9/3.1 PVR = 2.0 WU Ao sat = 100% PA sat = 71%, 72% SVC sat = 66%  Assessment: 1. Minimally elevated PA pressures with normal PVR and cardiac  output 2. No evidence of significant intracardiac shunt  CT Head 4/15: Evaluation limited by streak and beam hardening artifact arising from scalp  monitoring leads.  Apparent asymmetric hypodensity within the right cerebellar hemisphere may reflect asymmetric beam hardening artifact. Edema related to infarct or hypoxic/ischemic injury cannot be excluded. Consider brain MRI for further evaluation, as clinically warranted.  Paranasal sinus mucosal thickening.  Large bilateral mastoid effusions. Associated left middle ear effusion.  Micro Data:  4/16: Respiratory cultures growing yeast, incubation for better growth  Antimicrobials:  Levofloxacin 4/7 >> 4/8 Vancomycin 4/7 >> 4/12 Meropenem 4/9 >> 4/13  Subjective:  Found to have cuff leak yesterday afternoon. Stopcock placed at end of pilot Issues with CRRT overnight. Now on heparin with CRRT. Also with increasing HR overnight in the 120s. Amiodarone gtt increased to 60.   Objective   Blood pressure 104/64, pulse (!) 117, temperature (!) 97.5 F (36.4 C), temperature source Axillary, resp. rate (!) 33, height 5\' 7"  (1.702 m), weight 77.8 kg, last menstrual period 04/02/2017, SpO2 98 %. CVP:  [7 mmHg-14 mmHg] 7 mmHg  Vent Mode: PRVC FiO2 (%):  [50 %] 50 % Set Rate:  [26 bmp] 26 bmp Vt Set:  [400 mL] 400 mL PEEP:  [5 cmH20] 5 cmH20 Plateau Pressure:  [13 cmH20-14 cmH20] 13 cmH20   Intake/Output Summary (Last 24 hours) at 11/04/2019 4259 Last data filed at 11/04/2019 0600 Gross per 24 hour  Intake 1755.6 ml  Output 4864 ml  Net -3108.4 ml   Filed Weights   11/01/19 0451 11/02/19 0500 11/03/19 0406  Weight: 83.7 kg 78.6 kg 77.8 kg   Examination:  GEN: young female, unresponsive, in NAD  HEENT: NCAT, ETT in place  CV: tachycardic, no mrg  PULM: ventilated breath sounds bilaterally, diminished on the R  GI: soft, NTND  EXT: 2+ pitting edema bilaterally  NEURO: unresponsive, does not follow commands or withdraws to pain   Resolved Hospital Problem list   Convulsive status epilepticus   Assessment & Plan:  [name] is a 49 y.o. woman with history of SLE  with thrombocytopenia, nephritis, AVN and possible mild chronic RV failure who presented for repair of severe symptomatic tricuspid regurgitation on 4/7.    # Acute hypoxemic respiratory failure, on mechanical ventilation  -- Remains on full vent support  -- Comatose off sedation x 6 days though she is receiving phenobarbital which can be somewhat sedating  -- Mental status precludes liberation from vent  -- VAP precautions in place  -- Unfortunately patient has a poor prognosis and is unlikely to have a meaningful neurological recovery  --GOC discussion 4/19: DNR/DNI, her daughter's birthday is this Thursday and family would like to wait until then before making any decisions about withdrawal of care. They are considering withdrawal of care on Friday. She is to remain on full vent support until then.   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 Unable to obtain MRI due to retained wire  -- We appreciate neuro prognostication, remains poor with low chances of meaningful recovery   # WHO Group 1 PAH  -- Off iNO  -- Volume management with CVVHD  # Nutrition  -- TF @ 30    Best practice:  Diet: TF with free water Pain/Anxiety/Delirium protocol (if indicated): None VAP protocol (if indicated): Ordered DVT prophylaxis: heparin w/ CRRT, SCDs GI prophylaxis: PPI  Glucose control:  Controlled, blood sugars less than 180 Foley required for critically ill state Mobility: Bedrest Code Status: DNR  Family Communication: Per primary team Disposition: ICU  Please see attending attestation for final recommendations.

## 2019-11-04 NOTE — Progress Notes (Addendum)
TCTS DAILY ICU PROGRESS NOTE                   [address]            [place] [address]          [phone]   9 Days Post-Op Procedure(s) (LRB): MEDIASTERNAL WASH OUT,  ECMO DECANNULATION AND STERNAL CLOSURE (N/A) TRANSESOPHAGEAL ECHOCARDIOGRAM (TEE) (N/A)  Total Length of Stay:  LOS: 14 days   Subjective: Remains on vent, unresponsive.   Milrinone at 0.134mg/kg/min Amiodarone at '30mg'$ /hr Heparin at 500 units/hr Tube feeding at 397mhr  Objective: Vital signs in last 24 hours: Temp:  [96.8 F (36 C)-98.8 F (37.1 C)] 97.5 F (36.4 C) (04/21 0400) Pulse Rate:  [116-117] 117 (04/20 1530) Cardiac Rhythm: Sinus tachycardia (04/21 0400) Resp:  [18-38] 34 (04/21 0730) BP: (99-104)/(60-64) 104/64 (04/20 1530) SpO2:  [97 %-100 %] 97 % (04/21 0730) Arterial Line BP: (72-112)/(47-71) 112/71 (04/21 0730) FiO2 (%):  [50 %] 50 % (04/21 0400) Weight:  [76.3 kg] 76.3 kg (04/21 0500)  Filed Weights   11/02/19 0500 11/03/19 0406 11/04/19 0500  Weight: 78.6 kg 77.8 kg 76.3 kg    Weight change: -1.5 kg   Hemodynamic parameters for last 24 hours: CVP:  [7 mmHg-11 mmHg] 7 mmHg  Intake/Output from previous day: 04/20 0701 - 04/21 0700 In: 1755.7 [I.V.:733; NG/GT:720; IV Piggyback:280.7] Out: 4833   Intake/Output this shift: No intake/output data recorded.  Current Meds: Scheduled Meds: . bisacodyl  10 mg Oral Daily   Or  . bisacodyl  10 mg Rectal Daily  . chlorhexidine gluconate (MEDLINE KIT)  15 mL Mouth Rinse BID  . Chlorhexidine Gluconate Cloth  6 each Topical Daily  . feeding supplement (PIVOT 1.5 CAL)  1,000 mL Per Tube Q24H  . insulin aspart  0-24 Units Subcutaneous Q4H  . levothyroxine  50 mcg Per Tube Q0600  . mouth rinse  15 mL Mouth Rinse 10 times per day  . pantoprazole (PROTONIX) IV  40 mg Intravenous QHS  . PHENObarbital  160 mg Intravenous BID  . sodium chloride flush  10-40 mL Intracatheter Q12H   Continuous Infusions: .  prismasol  BGK 4/2.5 500 mL/hr at 11/04/19 069449.  prismasol BGK 4/2.5 300 mL/hr at 11/03/19 1821  . sodium chloride Stopped (10/27/19 1308)  . amiodarone 60 mg/hr (11/04/19 0700)  . amiodarone    . dextrose Stopped (10/30/19 0959)  . heparin 10,000 units/ 20 mL infusion syringe 500 Units/hr (11/03/19 1821)  . lacosamide (VIMPAT) IV Stopped (11/03/19 2225)  . levETIRAcetam Stopped (11/04/19 0039)  . milrinone 0.125 mcg/kg/min (11/04/19 0700)  . prismasol BGK 4/2.5 1,500 mL/hr at 11/04/19 0725  . propofol (DIPRIVAN) infusion Stopped (10/29/19 0415)   PRN Meds:.sodium chloride, artificial tears, dextrose, dextrose, heparin, heparin, HYDROmorphone (DILAUDID) injection, LORazepam, metoprolol tartrate, midazolam, ondansetron (ZOFRAN) IV, sodium chloride flush  Physical Exam: General appearance: On vent, remains unresponsive Heart: atrial flutter, VR 100-120. Monitor shows a-flutter with VR [id number]: Breath sounds diminished on right Extremities: Warm and well perfused.  Wound: the right chest and sternotomy incisions are intact and dry.   Lab Results: CBC: Recent Labs    11/03/19 0355 11/03/19 0427 11/04/19 0433 11/04/19 0436  WBC 11.4*  --  10.0  --   HGB 9.0*   < > 9.1* 10.2*  HCT 26.7*   < > 25.8* 30.0*  PLT 50*  --  78*  --    < > =  values in this interval not displayed.   BMET:  Recent Labs    11/03/19 1629 11/03/19 1736 11/04/19 0433 11/04/19 0436  NA 134*   < > 137 137  K 4.6   < > 4.5 4.4  CL 102  --  104  --   CO2 24  --  26  --   GLUCOSE 88  --  98  --   BUN 41*  --  38*  --   CREATININE 1.69*  --  1.73*  --   CALCIUM 8.3*  --  8.5*  --    < > = values in this interval not displayed.    CMET: Lab Results  Component Value Date   WBC 10.0 11/04/2019   HGB 10.2 (L) 11/04/2019   HCT 30.0 (L) 11/04/2019   PLT 78 (L) 11/04/2019   GLUCOSE 98 11/04/2019   TRIG 84 10/28/2019   ALT 23 11/04/2019   AST 49 (H) 11/04/2019   NA 137 11/04/2019   K 4.4 11/04/2019    CL 104 11/04/2019   CREATININE 1.73 (H) 11/04/2019   BUN 38 (H) 11/04/2019   CO2 26 11/04/2019   INR 1.3 (H) 11/04/2019   HGBA1C 7.3 (H) 10/19/2019      PT/INR:  Recent Labs    11/04/19 0433  LABPROT 15.8*  INR 1.3*   Radiology: No results found.   Assessment/Plan: S/P Procedure(s) (LRB): MEDIASTERNAL WASH OUT,  ECMO DECANNULATION AND STERNAL CLOSURE (N/A) TRANSESOPHAGEAL ECHOCARDIOGRAM (TEE) (N/A)  -POD 14 complex tricuspid valve repair with perioperative course complicated by profound coagulopathy, bleeding, v-fib arrest, cardio-respiratory failure requiring ECMO, and convulsive status epilepticus.   Appreciate re-evaluation by neurology and input from the palliative care team. Pt has been designated DNR since neurology has determined meaningful neurological recovery is unlikely.  Per family wishes, plan to continue supportive care for now and transition to comfort care later this week      Antony Odea, PA-C [phone] 11/04/2019 7:54 AM   I have seen and examined the patient and agree with the assessment as outlined.  Rexene Alberts, MD 11/04/2019 9:24 AM

## 2019-11-04 NOTE — Progress Notes (Signed)
Daily Progress Note   Patient Name: [name]       Date: 11/04/2019 DOB: 01-28-1971  Age: 49 y.o. MRN#: [id number] Attending Physician: Rexene Alberts, MD Primary Care Physician: [name], MD Admit Date: 11/07/2019  Reason for Consultation/Follow-up: Establishing goals of care  Subjective: Patient remains ventilated on CVVHD. Unresponsive to voice or pain. No family at the bedside.   Spoke with patient's husband via phone and provided updates and support. Husband expressing concerns and need for support in how to discuss patient's condition and prognosis with their children who are 12 and 13. [name] shares his struggle as the 46 yo is adopted and fears her reaction emotionally in losing a second mother. Therapeutic listening and support given.   He was able to reach someone with [facility] as recommended however, they do not have any available appointments until May. Education provided on further resources within the community. We were able to discuss case with Dr. Hulen Skains (Pediatric Psychologist) with [facility] with hopes of providing husband with additional support and guidance as he shares the devastating news to his children with husband's permission. Dr. Hulen Skains plans to contact husband and further discuss needs and offer support.   [name] understands his wife's prognosis and plans are to transition care to a more comfort approach for EOL possibly on Friday 11/06/19. His daughter's 13th birthday is tomorrow. Support given.   1630: Very appreciative of Dr. Hulen Skains. She was able to speak with husband and is planning to meet with him tomorrow 11/05/19 @ 0915 and offer support to [name]. Hopefully this will provide him much needed support as he has to bare the burden of  delivering such devastating and unfortunate news to their children in addition to losing his wife/best friend!  Length of Stay: 14  Vital Signs: BP 104/64   Pulse (!) 119   Temp 98.1 F (36.7 C) (Oral)   Resp (!) 35   Ht 5\' 7"  (1.702 m)   Wt 76.3 kg   LMP 04/02/2017   SpO2 96%   BMI 26.35 kg/m  SpO2: SpO2: 96 % O2 Device: O2 Device: Ventilator O2 Flow Rate: O2 Flow Rate (L/min): 40 L/min  Intake/output summary:   Intake/Output Summary (Last 24 hours) at 11/04/2019 1637 Last data filed at 11/04/2019 1600 Gross per 24  hour  Intake 2026.21 ml  Output 4994 ml  Net -2967.79 ml   LBM: Last BM Date: 11/03/19 Baseline Weight: Weight: 71 kg Most recent weight: Weight: 76.3 kg      Palliative Care Assessment & Plan   Recommendations/Plan:  Continue current plan of care per medical team   Discussed husband's needs regarding support with delivering patient's prognosis and anticipated death to their small children with Dr. Hulen Skains (Pediatric Psychologist). She has been in contact and is planning to meet with husband tomorrow @ [phone] and provide needed support.   Family's wishes are to wait until the weekend to proceed with compassionate wean for EOL care if no signs of improvement.   PMT will continue to support and follow.   Goals of Care and Additional Recommendations:  Limitations on Scope of Treatment: Full Scope Treatment  Code Status: DNR  Prognosis:   < 2 weeks  Discharge Planning:  Anticipated Hospital Death  Thank you for allowing the Palliative Medicine Team to assist in the care of this patient.  Time Total: 45 min.   Visit consisted of counseling and education dealing with the complex and emotionally intense issues of symptom management and palliative care in the setting of serious and potentially life-threatening illness.Greater than 50%  of this time was spent counseling and coordinating care related to the above assessment and plan.  Elie Confer,  FNP-C  Alda Lea, AGPCNP-BC  Palliative Medicine Team [phone]   Please contact Palliative Medicine Team phone at [phone] for questions and concerns.

## 2019-11-04 NOTE — Progress Notes (Signed)
Nutrition Follow-up  DOCUMENTATION CODES:   Not applicable  INTERVENTION:   Tube Feeding:  Continue Pivot 1.5 at 30 ml/hr Add Pro-Stat 30 mL QID Provides 1480 kcals, 128 g of protein, 547 mL of free water  Continue B-complex with C   NUTRITION DIAGNOSIS:   Inadequate oral intake related to inability to eat, acute illness as evidenced by NPO status.  Continues   GOAL:   Patient will meet greater than or equal to 90% of their needs  Addressed via TF   MONITOR:   Vent status, Skin, TF tolerance, Weight trends, Labs, I & O's  REASON FOR ASSESSMENT:   Consult, Ventilator Assessment of nutrition requirement/status  ASSESSMENT:   49 yo female admitted with severe TR with TV repair on 4/7, pt with significant postop bleeding requiring return to OR for median sternotomy and reexploration and initiation of VA ECMO. 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 Unclear as to why. RN not sure but reports high residual of 1000 mL given in report but not charted.   Labs: reviewed Meds: reviewed  Diet Order:   Diet Order            Diet NPO time specified  Diet effective now              EDUCATION NEEDS:   Not appropriate for education at this time  Skin:  Skin Assessment: Skin Integrity Issues: Skin Integrity Issues:: Stage  II Stage II: sacrum Wound Vac: Chest Incisions: multple chest, R axilla, abdomen  Last BM:  4/21 rectal tube  Height:   Ht Readings from Last 1 Encounters:  10/31/2019 5\' 7"  (1.702 m)    Weight:   Wt Readings from Last 1 Encounters:  11/04/19 76.3 kg   BMI:  Body mass index is 26.35 kg/m.  Estimated Nutritional Needs:   Kcal:  1920-2130 kcals  Protein:  107-142 g  Fluid:  1.7 L  Kerman Passey MS, RDN, LDN, CNSC RD Pager Number and Weekend/On-Call After Hours Pager Located in [place]

## 2019-11-05 DIAGNOSIS — L8990 Pressure ulcer of unspecified site, unspecified stage: Secondary | ICD-10-CM | POA: Insufficient documentation

## 2019-11-05 DIAGNOSIS — G931 Anoxic brain damage, not elsewhere classified: Secondary | ICD-10-CM

## 2019-11-05 LAB — POCT I-STAT 7, (LYTES, BLD GAS, ICA,H+H)
Acid-Base Excess: 4 mmol/L — ABNORMAL HIGH (ref 0.0–2.0)
Acid-Base Excess: 3 mmol/L — ABNORMAL HIGH (ref 0.0–2.0)
Acid-Base Excess: 5 mmol/L — ABNORMAL HIGH (ref 0.0–2.0)
Bicarbonate: 28.1 mmol/L — ABNORMAL HIGH (ref 20.0–28.0)
Bicarbonate: 27.6 mmol/L (ref 20.0–28.0)
Bicarbonate: 28.6 mmol/L — ABNORMAL HIGH (ref 20.0–28.0)
Calcium, Ion: 1.21 mmol/L (ref 1.15–1.40)
Calcium, Ion: 1.2 mmol/L (ref 1.15–1.40)
Calcium, Ion: 1.16 mmol/L (ref 1.15–1.40)
HCT: 31 % — ABNORMAL LOW (ref 36.0–46.0)
HCT: 33 % — ABNORMAL LOW (ref 36.0–46.0)
HCT: 29 % — ABNORMAL LOW (ref 36.0–46.0)
Hemoglobin: 10.5 g/dL — ABNORMAL LOW (ref 12.0–15.0)
Hemoglobin: 11.2 g/dL — ABNORMAL LOW (ref 12.0–15.0)
Hemoglobin: 9.9 g/dL — ABNORMAL LOW (ref 12.0–15.0)
O2 Saturation: 99 %
O2 Saturation: 100 %
O2 Saturation: 100 %
Patient temperature: 36.9
Patient temperature: 98.2
Patient temperature: 99.1
Potassium: 4.4 mmol/L (ref 3.5–5.1)
Potassium: 4.5 mmol/L (ref 3.5–5.1)
Potassium: 4.5 mmol/L (ref 3.5–5.1)
Sodium: 137 mmol/L (ref 135–145)
Sodium: 137 mmol/L (ref 135–145)
Sodium: 136 mmol/L (ref 135–145)
TCO2: 29 mmol/L (ref 22–32)
TCO2: 29 mmol/L (ref 22–32)
TCO2: 30 mmol/L (ref 22–32)
pCO2 arterial: 40.1 mmHg (ref 32.0–48.0)
pCO2 arterial: 41.2 mmHg (ref 32.0–48.0)
pCO2 arterial: 38.9 mmHg (ref 32.0–48.0)
pH, Arterial: 7.453 — ABNORMAL HIGH (ref 7.350–7.450)
pH, Arterial: 7.433 (ref 7.350–7.450)
pH, Arterial: 7.475 — ABNORMAL HIGH (ref 7.350–7.450)
pO2, Arterial: 131 mmHg — ABNORMAL HIGH (ref 83.0–108.0)
pO2, Arterial: 186 mmHg — ABNORMAL HIGH (ref 83.0–108.0)
pO2, Arterial: 181 mmHg — ABNORMAL HIGH (ref 83.0–108.0)

## 2019-11-05 LAB — CBC WITH DIFFERENTIAL/PLATELET
Abs Immature Granulocytes: 0.09 10*3/uL — ABNORMAL HIGH (ref 0.00–0.07)
Basophils Absolute: 0 10*3/uL (ref 0.0–0.1)
Basophils Relative: 0 %
Eosinophils Absolute: 0 10*3/uL (ref 0.0–0.5)
Eosinophils Relative: 0 %
HCT: 27.4 % — ABNORMAL LOW (ref 36.0–46.0)
Hemoglobin: 9.5 g/dL — ABNORMAL LOW (ref 12.0–15.0)
Immature Granulocytes: 1 %
Lymphocytes Relative: 6 %
Lymphs Abs: 0.6 10*3/uL — ABNORMAL LOW (ref 0.7–4.0)
MCH: 29 pg (ref 26.0–34.0)
MCHC: 34.7 g/dL (ref 30.0–36.0)
MCV: 83.5 fL (ref 80.0–100.0)
Monocytes Absolute: 1.1 10*3/uL — ABNORMAL HIGH (ref 0.1–1.0)
Monocytes Relative: 11 %
Neutro Abs: 8.4 10*3/uL — ABNORMAL HIGH (ref 1.7–7.7)
Neutrophils Relative %: 82 %
Platelets: 100 10*3/uL — ABNORMAL LOW (ref 150–400)
RBC: 3.28 MIL/uL — ABNORMAL LOW (ref 3.87–5.11)
RDW: 19.2 % — ABNORMAL HIGH (ref 11.5–15.5)
WBC: 10.3 10*3/uL (ref 4.0–10.5)
nRBC: 0.2 % (ref 0.0–0.2)

## 2019-11-05 LAB — COOXEMETRY PANEL
Carboxyhemoglobin: 1.3 % (ref 0.5–1.5)
Methemoglobin: 0.8 % (ref 0.0–1.5)
O2 Saturation: 52.8 %
Total hemoglobin: 9.3 g/dL — ABNORMAL LOW (ref 12.0–16.0)

## 2019-11-05 LAB — RENAL FUNCTION PANEL
Albumin: 2.5 g/dL — ABNORMAL LOW (ref 3.5–5.0)
Albumin: 2.3 g/dL — ABNORMAL LOW (ref 3.5–5.0)
Anion gap: 8 (ref 5–15)
Anion gap: 11 (ref 5–15)
BUN: 41 mg/dL — ABNORMAL HIGH (ref 6–20)
BUN: 51 mg/dL — ABNORMAL HIGH (ref 6–20)
CO2: 26 mmol/L (ref 22–32)
CO2: 24 mmol/L (ref 22–32)
Calcium: 8.6 mg/dL — ABNORMAL LOW (ref 8.9–10.3)
Calcium: 8.2 mg/dL — ABNORMAL LOW (ref 8.9–10.3)
Chloride: 101 mmol/L (ref 98–111)
Chloride: 100 mmol/L (ref 98–111)
Creatinine, Ser: 1.74 mg/dL — ABNORMAL HIGH (ref 0.44–1.00)
Creatinine, Ser: 2.28 mg/dL — ABNORMAL HIGH (ref 0.44–1.00)
GFR calc Af Amer: 40 mL/min — ABNORMAL LOW (ref >60)
GFR calc Af Amer: 29 mL/min — ABNORMAL LOW (ref >60)
GFR calc non Af Amer: 35 mL/min — ABNORMAL LOW (ref >60)
GFR calc non Af Amer: 25 mL/min — ABNORMAL LOW (ref >60)
Glucose, Bld: 75 mg/dL (ref 70–99)
Glucose, Bld: 129 mg/dL — ABNORMAL HIGH (ref 70–99)
Phosphorus: 3.5 mg/dL (ref 2.5–4.6)
Phosphorus: 4.1 mg/dL (ref 2.5–4.6)
Potassium: 4.5 mmol/L (ref 3.5–5.1)
Potassium: 4.4 mmol/L (ref 3.5–5.1)
Sodium: 135 mmol/L (ref 135–145)
Sodium: 135 mmol/L (ref 135–145)

## 2019-11-05 LAB — GLUCOSE, CAPILLARY
Glucose-Capillary: 71 mg/dL (ref 70–99)
Glucose-Capillary: 61 mg/dL — ABNORMAL LOW (ref 70–99)
Glucose-Capillary: 142 mg/dL — ABNORMAL HIGH (ref 70–99)
Glucose-Capillary: 144 mg/dL — ABNORMAL HIGH (ref 70–99)
Glucose-Capillary: 125 mg/dL — ABNORMAL HIGH (ref 70–99)
Glucose-Capillary: 113 mg/dL — ABNORMAL HIGH (ref 70–99)
Glucose-Capillary: 133 mg/dL — ABNORMAL HIGH (ref 70–99)

## 2019-11-05 LAB — HEPATIC FUNCTION PANEL
ALT: 24 U/L (ref 0–44)
AST: 55 U/L — ABNORMAL HIGH (ref 15–41)
Albumin: 2.5 g/dL — ABNORMAL LOW (ref 3.5–5.0)
Alkaline Phosphatase: 193 U/L — ABNORMAL HIGH (ref 38–126)
Bilirubin, Direct: 0.8 mg/dL — ABNORMAL HIGH (ref 0.0–0.2)
Indirect Bilirubin: 0.7 mg/dL (ref 0.3–0.9)
Total Bilirubin: 1.5 mg/dL — ABNORMAL HIGH (ref 0.3–1.2)
Total Protein: 7.5 g/dL (ref 6.5–8.1)

## 2019-11-05 LAB — PROTIME-INR
INR: 1.2 (ref 0.8–1.2)
Prothrombin Time: 15.5 seconds — ABNORMAL HIGH (ref 11.4–15.2)

## 2019-11-05 LAB — APTT: aPTT: 50 seconds — ABNORMAL HIGH (ref 24–36)

## 2019-11-05 LAB — MAGNESIUM: Magnesium: 2.8 mg/dL — ABNORMAL HIGH (ref 1.7–2.4)

## 2019-11-05 MED ORDER — FENTANYL CITRATE (PF) 100 MCG/2ML IJ SOLN
25.0000 ug | INTRAMUSCULAR | Status: DC | PRN
  Administered 2019-11-05 – 2019-11-06 (×2): 25 ug via INTRAVENOUS
  Filled 2019-11-05 (×2): qty 2

## 2019-11-05 MED FILL — Sodium Chloride IV Soln 0.9%: INTRAVENOUS | Qty: 5000 | Status: AC

## 2019-11-05 MED FILL — Sodium Chloride IV Soln 0.9%: INTRAVENOUS | Qty: 1000 | Status: AC

## 2019-11-05 MED FILL — Heparin Sodium (Porcine) Inj 1000 Unit/ML: INTRAMUSCULAR | Qty: 30 | Status: AC

## 2019-11-05 NOTE — Progress Notes (Signed)
EVENING ROUNDS NOTE :     [address]       [place] [address]             [phone]                 10 Days Post-Op Procedure(s) (LRB): MEDIASTERNAL WASH OUT,  ECMO DECANNULATION AND STERNAL CLOSURE (N/A) TRANSESOPHAGEAL ECHOCARDIOGRAM (TEE) (N/A)   Total Length of Stay:  LOS: 15 days  Events:  No events    BP 109/62 Comment: A-line Comment (BP Location): R brachial A-line  Pulse (!) 115   Temp 98 F (36.7 C)   Resp (!) 27   Ht 5\' 7"  (1.702 m)   Wt 66.4 kg Comment: repeated x2 to confirm  LMP 04/02/2017   SpO2 98%   BMI 22.93 kg/m   CVP:  [6 mmHg-9 mmHg] 8 mmHg  Vent Mode: PRVC FiO2 (%):  [50 %-80 %] 50 % Set Rate:  [26 bmp] 26 bmp Vt Set:  [400 mL] 400 mL PEEP:  [5 cmH20] 5 cmH20 Plateau Pressure:  [15 cmH20-25 cmH20] 25 cmH20  .  prismasol BGK 4/2.5 500 mL/hr at 11/05/19 1345  .  prismasol BGK 4/2.5 300 mL/hr at 11/05/19 1345  . sodium chloride Stopped (10/27/19 1308)  . amiodarone 30 mg/hr (11/05/19 1700)  . amiodarone    . dextrose 50 mL/hr at 11/05/19 1700  . heparin 10,000 units/ 20 mL infusion syringe 500 Units/hr (11/05/19 1700)  . lacosamide (VIMPAT) IV Stopped (11/05/19 1124)  . levETIRAcetam Stopped (11/05/19 1244)  . milrinone 0.125 mcg/kg/min (11/05/19 1700)  . prismasol BGK 4/2.5 1,500 mL/hr at 11/05/19 1713  . propofol (DIPRIVAN) infusion Stopped (10/29/19 0415)    I/O last 3 completed shifts: In: 2875.8 [I.V.:1011.2; Other:22; NG/GT:1170; IV Piggyback:672.5] Out: 9147 [Other:8611]   CBC Latest Ref Rng & Units 11/05/2019 11/05/2019 11/04/2019  WBC 4.0 - 10.5 K/uL - 10.3 -  Hemoglobin 12.0 - 15.0 g/dL 11.2(L) 9.5(L) 10.5(L)  Hematocrit 36.0 - 46.0 % 33.0(L) 27.4(L) 31.0(L)  Platelets 150 - 400 K/uL - 100(L) -    BMP Latest Ref Rng & Units 11/05/2019 11/05/2019 11/05/2019  Glucose 70 - 99 mg/dL 129(H) - 75  BUN 6 - 20 mg/dL 51(H) - 41(H)  Creatinine 0.44 - 1.00 mg/dL 2.28(H) - 1.74(H)  Sodium 135 - 145 mmol/L 135 137  135  Potassium 3.5 - 5.1 mmol/L 4.4 4.5 4.5  Chloride 98 - 111 mmol/L 100 - 101  CO2 22 - 32 mmol/L 24 - 26  Calcium 8.9 - 10.3 mg/dL 8.2(L) - 8.6(L)    ABG    Component Value Date/Time   PHART 7.433 11/05/2019 0543   PCO2ART 41.2 11/05/2019 0543   PO2ART 186 (H) 11/05/2019 0543   HCO3 27.6 11/05/2019 0543   TCO2 29 11/05/2019 0543   ACIDBASEDEF 1.0 10/28/2019 1841   O2SAT 100.0 11/05/2019 0543       Melodie Bouillon, MD 11/05/2019 5:32 PM

## 2019-11-05 NOTE — Progress Notes (Addendum)
Hypoglycemic Event  CBG: 61  Treatment: Dextrose 50% 12.5g given at 0851  Symptoms: none, pt unresponsive  Follow-up CBG: Time: 142 @ 0918  Possible Reasons for Event: decreased nutrition via tube feed d/t not tolerating. Dextrose was stopped.   Comments/MD notified:D 10 fluids restarted and increased to 50cc/h. Insulin orders d/c.     Alma Friendly

## 2019-11-05 NOTE — Progress Notes (Signed)
Pt on CRRT, filter set being changed upon my arrival onto shift. Set up successful and within 40 minutes CRRT machine suddenly had Excessive TMP alarms along with extreme negative effluent pressure. This was unresolved by the technician via phone, and the filter had to be exchanged again. The second filter this RN started had great pressures until about 20-30 minutes in, and suddenly gave the same alarms. 1800 number called again for assistance and this RN was instructed to stop treatment, return blood, and begin treatment with a new filter AND new CRRT machine. The original CRRT machine has been red tagged for maintenance. No issues with new machine since. Pt off CRRT for several hours today due to these circumstances. MD aware. Will continue to monitor.     Alma Friendly

## 2019-11-05 NOTE — Progress Notes (Addendum)
TCTS DAILY ICU PROGRESS NOTE                   [address]            [place] [address]          [phone]   10 Days Post-Op Procedure(s) (LRB): MEDIASTERNAL WASH OUT,  ECMO DECANNULATION AND STERNAL CLOSURE (N/A) TRANSESOPHAGEAL ECHOCARDIOGRAM (TEE) (N/A)  Total Length of Stay:  LOS: 15 days   Subjective: Remains on vent and unresponsive.  No new issues past 24 hours.  Consultants' and Palliative Care notes reviewed.   Objective: Vital signs in last 24 hours: Temp:  [97.5 F (36.4 C)-98.4 F (36.9 C)] 98.3 F (36.8 C) (04/22 0700) Pulse Rate:  [115-119] 115 (04/22 0346) Cardiac Rhythm: Sinus tachycardia (04/22 0000) Resp:  [21-37] 24 (04/22 0700) BP: (100-139)/(64-84) 114/69 (04/22 0346) SpO2:  [86 %-100 %] 99 % (04/22 0745) Arterial Line BP: (94-139)/(57-84) 94/57 (04/22 0700) FiO2 (%):  [50 %-80 %] 60 % (04/22 0745) Weight:  [66.4 kg] 66.4 kg (04/22 0500)  Filed Weights   11/03/19 0406 11/04/19 0500 11/05/19 0500  Weight: 77.8 kg 76.3 kg 66.4 kg    Weight change: -9.9 kg   Hemodynamic parameters for last 24 hours: CVP:  [7 mmHg-9 mmHg] 7 mmHg  Intake/Output from previous day: 04/21 0701 - 04/22 0700 In: 1894.8 [I.V.:557.9; NG/GT:810; IV Piggyback:526.8] Out: 6118   Intake/Output this shift: No intake/output data recorded.  Current Meds: Scheduled Meds: . bisacodyl  10 mg Oral Daily   Or  . bisacodyl  10 mg Rectal Daily  . chlorhexidine gluconate (MEDLINE KIT)  15 mL Mouth Rinse BID  . Chlorhexidine Gluconate Cloth  6 each Topical Daily  . feeding supplement (PIVOT 1.5 CAL)  1,000 mL Per Tube Q24H  . feeding supplement (PRO-STAT SUGAR FREE 64)  30 mL Per Tube QID  . insulin aspart  0-24 Units Subcutaneous Q4H  . levothyroxine  50 mcg Per Tube Q0600  . mouth rinse  15 mL Mouth Rinse 10 times per day  . pantoprazole (PROTONIX) IV  40 mg Intravenous QHS  . PHENObarbital  160 mg Intravenous BID  . sodium chloride flush  10-40 mL  Intracatheter Q12H   Continuous Infusions: .  prismasol BGK 4/2.5 500 mL/hr at 11/05/19 0344  .  prismasol BGK 4/2.5 300 mL/hr at 11/05/19 0902  . sodium chloride Stopped (10/27/19 1308)  . amiodarone 30 mg/hr (11/05/19 0700)  . amiodarone    . dextrose Stopped (10/30/19 0959)  . heparin 10,000 units/ 20 mL infusion syringe 500 Units/hr (11/05/19 0858)  . lacosamide (VIMPAT) IV 70 mL/hr at 11/04/19 2326  . levETIRAcetam Stopped (11/04/19 2343)  . milrinone 0.125 mcg/kg/min (11/05/19 0700)  . prismasol BGK 4/2.5 1,500 mL/hr at 11/05/19 0401  . propofol (DIPRIVAN) infusion Stopped (10/29/19 0415)   PRN Meds:.sodium chloride, artificial tears, dextrose, dextrose, fentaNYL (SUBLIMAZE) injection, heparin, heparin, HYDROmorphone (DILAUDID) injection, LORazepam, metoprolol tartrate, midazolam, ondansetron (ZOFRAN) IV, sodium chloride flush  Physical Exam: General appearance:On vent, remains unresponsive Heart:tachycardic 100-120 and regular. Lungs:Breath sounds clear anterior Extremities:Warm and well perfused. Wound:the right chest and sternotomy incisions are intact and dry.Has a 2x7cm Stage 2 sacral pressure sore reported by RN that is covered with a sacral dressing.   Lab Results: CBC: Recent Labs    11/04/19 0433 11/04/19 0436 11/05/19 0404 11/05/19 0543  WBC 10.0  --  10.3  --   HGB 9.1*   < > 9.5* 11.2*  HCT 25.8*   < > 27.4* 33.0*  PLT 78*  --  100*  --    < > = values in this interval not displayed.   BMET:  Recent Labs    11/04/19 1555 11/04/19 1850 11/05/19 0404 11/05/19 0543  NA 138   < > 135 137  K 4.4   < > 4.5 4.5  CL 103  --  101  --   CO2 25  --  26  --   GLUCOSE 118*  --  75  --   BUN 35*  --  41*  --   CREATININE 1.63*  --  1.74*  --   CALCIUM 8.2*  --  8.6*  --    < > = values in this interval not displayed.    CMET: Lab Results  Component Value Date   WBC 10.3 11/05/2019   HGB 11.2 (L) 11/05/2019   HCT 33.0 (L) 11/05/2019   PLT 100  (L) 11/05/2019   GLUCOSE 75 11/05/2019   TRIG 84 10/28/2019   ALT 24 11/05/2019   AST 55 (H) 11/05/2019   NA 137 11/05/2019   K 4.5 11/05/2019   CL 101 11/05/2019   CREATININE 1.74 (H) 11/05/2019   BUN 41 (H) 11/05/2019   CO2 26 11/05/2019   INR 1.2 11/05/2019   HGBA1C 7.3 (H) 10/19/2019      PT/INR:  Recent Labs    11/05/19 0404  LABPROT 15.5*  INR 1.2   Radiology: DG CHEST PORT 1 VIEW  Result Date: 11/04/2019 CLINICAL DATA:  Abnormal chest sounds. EXAM: PORTABLE CHEST 1 VIEW COMPARISON:  Radiographs 11/02/2019 and 11/01/2019. CT 03/24/2019. FINDINGS: 1658 hours. Right chest tube has been removed in the interval. The right IJ central venous catheter, left arm PICC, endotracheal and nasogastric tubes are unchanged in position. The heart size and mediastinal contours are stable. Right pleural effusion and right lung airspace opacities have not significantly changed. The left lung is clear. No evidence of pneumothorax. IMPRESSION: No significant change in right pleural effusion and right lung airspace opacities. No evidence of pneumothorax following right chest tube removal. Electronically Signed   By: Richardean Sale M.D.   On: 11/04/2019 18:55     Assessment/Plan: S/P Procedure(s) (LRB): MEDIASTERNAL WASH OUT,  ECMO DECANNULATION AND STERNAL CLOSURE (N/A) TRANSESOPHAGEAL ECHOCARDIOGRAM (TEE) (N/A)   -POD 15 complex tricuspid valve repair with perioperative course complicated by profound coagulopathy, bleeding, v-fib arrest, cardio-respiratory failure requiring ECMO, and convulsive status epilepticus. Remains unresponsive and has been designated DNR as neurologic recovery appears unlikely.  Appreciate re-evaluation by neurology and input from the palliative care team. Per family wishes, plan to continue supportive care for now and transition to comfort care tomorrow.   Antony Odea, PA-C [phone] 11/05/2019 9:13 AM   I have seen and examined the patient and  agree with the assessment and plan as outlined.  Rexene Alberts, MD 11/05/2019

## 2019-11-05 NOTE — Progress Notes (Signed)
[facility] Progress Note Patient Name: [name] DOB: July 06, 1971 MRN: [id number]   Date of Service  11/05/2019  HPI/Events of Note  Pt needs an a.m. CBC  eICU Interventions  a.m. CBC with Diff ordered.        Kerry Kass Javonn Gauger 11/05/2019, 1:58 AM

## 2019-11-05 NOTE — Progress Notes (Signed)
RT NOTE: RT transported patient on ventilator from room 2H03 to room 9J24 with no complications. Vitals are stable. RT will continue to monitor.

## 2019-11-05 NOTE — Progress Notes (Signed)
TCTS BRIEF SICU PROGRESS NOTE  10 Days Post-Op  S/P Procedure(s) (LRB): MEDIASTERNAL [place] OUT,  ECMO DECANNULATION AND STERNAL CLOSURE (N/A) TRANSESOPHAGEAL ECHOCARDIOGRAM (TEE) (N/A)   Discussed circumstances at length with patient's husband Herbie Baltimore over the telephone.  I concur with all other specialists involved that patient has clearly suffered severe anoxic brain injury and has essentially no chance for a meaningful recovery from a neurologic standpoint.  I support a decision to withdraw support once the family is ready.  They plan to meet with the Palliative Care team again tomorrow.  All questions answered.    Rexene Alberts, MD 11/05/2019 4:24 PM

## 2019-11-05 NOTE — Progress Notes (Signed)
Patient ID: [name], female   DOB: 1970-12-03, 49 y.o.   MRN: [id number] P    Advanced Heart Failure Rounding Note  PCP-Cardiologist: No primary care provider on file.   Subjective:    Remains on vent. Now off Versed and Propofol since 4/15 and on phenobarbital IV for seizures.  Remains unresponsive. Continues on milrinone 0.125.   Remains on milrinone 0.125. Co-ox 53%. On CVVHD. Anuric. Weight at 146 this am (was 168 yesterday - unsure accurate). Pre=op weight 156. CI 2.1 -2.5 from FloTrac  Tmax 98.4  WBC 10.3k Off abx  Sputum culture 4/17 negative  Hgb stable at 9.1 this am. PLTs  78K -> 100k  She s/p 5 days IVIG - finished 4/19.   No overt bleeding  She remains in atrial flutter rates 110-120 on amio 30/hr.   Objective:   Weight Range: 66.4 kg Body mass index is 22.93 kg/m.   Vital Signs:   Temp:  [97.5 F (36.4 C)-98.4 F (36.9 C)] 98.3 F (36.8 C) (04/22 0700) Pulse Rate:  [115-119] 115 (04/22 0346) Resp:  [21-37] 24 (04/22 0700) BP: (100-139)/(64-84) 114/69 (04/22 0346) SpO2:  [86 %-100 %] 100 % (04/22 0645) Arterial Line BP: (94-139)/(57-84) 94/57 (04/22 0700) FiO2 (%):  [50 %-80 %] 60 % (04/22 0347) Weight:  [66.4 kg] 66.4 kg (04/22 0500) Last BM Date: 11/04/19(small amount of drainage in flexi)  Weight change: Filed Weights   11/03/19 0406 11/04/19 0500 11/05/19 0500  Weight: 77.8 kg 76.3 kg 66.4 kg    Intake/Output:   Intake/Output Summary (Last 24 hours) at 11/05/2019 0811 Last data filed at 11/05/2019 0700 Gross per 24 hour  Intake 1827.7 ml  Output 5871 ml  Net -4043.3 ml      Physical Exam    General:  Critically ill appearing Intubated unresponsive HEENT: normal + ETT  Neck: supple.RIJ cath Carotids 2+ bilat; no bruits. No lymphadenopathy or thryomegaly appreciated. Cor: PMI nondisplaced. Regular tachy. No rubs, gallops or murmurs. Lungs: clear Abdomen: soft, nontender, nondistended. No hepatosplenomegaly. No bruits or masses. Good  bowel sounds. Extremities: no cyanosis, clubbing, rash, edema Neuro: unresponsive. No response to pain   Telemetry   AFL  Rate 110-120 (Personally reviewed)   Labs    CBC Recent Labs    11/04/19 0433 11/04/19 0436 11/05/19 0404 11/05/19 0543  WBC 10.0  --  10.3  --   NEUTROABS  --   --  8.4*  --   HGB 9.1*   < > 9.5* 11.2*  HCT 25.8*   < > 27.4* 33.0*  MCV 82.4  --  83.5  --   PLT 78*  --  100*  --    < > = values in this interval not displayed.   Basic Metabolic Panel Recent Labs    11/04/19 0433 11/04/19 0436 11/04/19 1555 11/04/19 1850 11/05/19 0404 11/05/19 0543  NA 137   < > 138   < > 135 137  K 4.5   < > 4.4   < > 4.5 4.5  CL 104   < > 103  --  101  --   CO2 26   < > 25  --  26  --   GLUCOSE 98   < > 118*  --  75  --   BUN 38*   < > 35*  --  41*  --   CREATININE 1.73*   < > 1.63*  --  1.74*  --   CALCIUM 8.5*   < >  8.2*  --  8.6*  --   MG 2.6*  --   --   --  2.8*  --   PHOS 2.2*   < > 3.8  --  3.5  --    < > = values in this interval not displayed.   Liver Function Tests Recent Labs    11/04/19 0433 11/04/19 0433 11/04/19 1555 11/05/19 0404  AST 49*  --   --  55*  ALT 23  --   --  24  ALKPHOS 169*  --   --  193*  BILITOT 1.3*  --   --  1.5*  PROT 7.5  --   --  7.5  ALBUMIN 2.3*  2.3*   < > 2.3* 2.5*  2.5*   < > = values in this interval not displayed.   No results for input(s): LIPASE, AMYLASE in the last 72 hours. Cardiac Enzymes No results for input(s): CKTOTAL, CKMB, CKMBINDEX, TROPONINI in the last 72 hours.  BNP: BNP (last 3 results) Recent Labs    04/15/19 1101  BNP 248.7*    ProBNP (last 3 results) No results for input(s): PROBNP in the last 8760 hours.   D-Dimer No results for input(s): DDIMER in the last 72 hours. Hemoglobin A1C No results for input(s): HGBA1C in the last 72 hours. Fasting Lipid Panel No results for input(s): CHOL, HDL, LDLCALC, TRIG, CHOLHDL, LDLDIRECT in the last 72 hours. Thyroid Function Tests  No results for input(s): TSH, T4TOTAL, T3FREE, THYROIDAB in the last 72 hours.  Invalid input(s): FREET3  Other results:   Imaging    DG CHEST PORT 1 VIEW  Result Date: 11/04/2019 CLINICAL DATA:  Abnormal chest sounds. EXAM: PORTABLE CHEST 1 VIEW COMPARISON:  Radiographs 11/02/2019 and 11/01/2019. CT 03/24/2019. FINDINGS: 1658 hours. Right chest tube has been removed in the interval. The right IJ central venous catheter, left arm PICC, endotracheal and nasogastric tubes are unchanged in position. The heart size and mediastinal contours are stable. Right pleural effusion and right lung airspace opacities have not significantly changed. The left lung is clear. No evidence of pneumothorax. IMPRESSION: No significant change in right pleural effusion and right lung airspace opacities. No evidence of pneumothorax following right chest tube removal. Electronically Signed   By: Richardean Sale M.D.   On: 11/04/2019 18:55     Medications:     Scheduled Medications: . bisacodyl  10 mg Oral Daily   Or  . bisacodyl  10 mg Rectal Daily  . chlorhexidine gluconate (MEDLINE KIT)  15 mL Mouth Rinse BID  . Chlorhexidine Gluconate Cloth  6 each Topical Daily  . feeding supplement (PIVOT 1.5 CAL)  1,000 mL Per Tube Q24H  . feeding supplement (PRO-STAT SUGAR FREE 64)  30 mL Per Tube QID  . insulin aspart  0-24 Units Subcutaneous Q4H  . levothyroxine  50 mcg Per Tube Q0600  . mouth rinse  15 mL Mouth Rinse 10 times per day  . pantoprazole (PROTONIX) IV  40 mg Intravenous QHS  . PHENObarbital  160 mg Intravenous BID  . sodium chloride flush  10-40 mL Intracatheter Q12H    Infusions: .  prismasol BGK 4/2.5 500 mL/hr at 11/05/19 0344  .  prismasol BGK 4/2.5 300 mL/hr at 11/05/19 0500  . sodium chloride Stopped (10/27/19 1308)  . amiodarone 30 mg/hr (11/05/19 0700)  . amiodarone    . dextrose Stopped (10/30/19 0959)  . heparin 10,000 units/ 20 mL infusion syringe 500 Units/hr (11/04/19 1900)  .  lacosamide (VIMPAT) IV 70 mL/hr at 11/04/19 2326  . levETIRAcetam Stopped (11/04/19 2343)  . milrinone 0.125 mcg/kg/min (11/05/19 0700)  . prismasol BGK 4/2.5 1,500 mL/hr at 11/05/19 0401  . propofol (DIPRIVAN) infusion Stopped (10/29/19 0415)    PRN Medications: sodium chloride, artificial tears, dextrose, dextrose, fentaNYL (SUBLIMAZE) injection, heparin, heparin, HYDROmorphone (DILAUDID) injection, LORazepam, metoprolol tartrate, midazolam, ondansetron (ZOFRAN) IV, sodium chloride flush     Assessment/Plan   1. Post-cardiotomy hemorrhagic shock - Patient with refractory post-operative chest bleeding s/p minimally invasive TV repair on 10/17/2019 in setting of longstanding SLE - has received  RBCS, PLTs, TXA, cryo, Factor 7, steroids - ECMO started in OR - Repeat OR visit for right chest clot removal on 4/9 with improved hemodynamics, TEE with EF 40-45%, mild-moderately decreased RV function, stable replaced TV.  - ECMO decannulated 4/12.  - Stable now w/o overt bleeding. Hgb 10.3  2. Acute systolic CHF, primarily RV failure with cardiogenic shock - CVP 11 this morning with co-ox 53%, FloTrak CI 2.1-2.5 - Weight seems inaccurate today. But doesn't appear to have much more fluid to pull. Can drop CVVHD rate. - Continue milrinone 0.125, off norepinephrine now and MAP stable despite atrial flutter.   - Hemodynamics currently stable. Main issue is Neuro status  3. 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  - plan as above  4. Post-operative respiratory failure - remains intubated, FiO2 0.5.   - Progressive airspace disease in R lung after mini thoracotomy, back to OR 4/9 for right chest clot removal.  - CCM following vent - Off abx. AF. WBC ok  - CXR 4/21 with stable R lung opacity/effusion Personally reviewed  5. Acute blood loss anemia - Now stable.   6. 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 Last dose 4/19 - No overt bleeding.   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  - She is now on Vimpat and Keppra.  - With ongoing seizures, she is now off Propofol and Versed, started phenobarbital IV.  No further seizure activity.  - Head CT 4/15 with significant artifact but may show some anoxic injury.  Unable to get MRI due to retained wire.  - No awakening yet off sedation, now off since 4/15 hrs off sedation. Neurology following.  - Seen by Neurology this week with only minimal response to pain. Neuro prognostication remains poor. Family meeting this week and made DNR. However today is patient's daughter's birthday. They want to hold off on transition to comfort care until at least tomorrow. Pallaitvie Care have reached out to Pediatric psychiatrist who is now involved. Appreciate their efforts greatly.  Will continue current level of care today. Possible switch to comfort care tomorrow due to devastating neurologic injury.   8. SLE - no change  9. AKI  - CVVH ongoing, - Will cut back today  10. Nutrition - Tube feeds. Tolerated well  11. DVT prophylaxis - SCDs  12. Atrial fibrillation/flutter - She remains in atrial flutter at rates around 110-120.  Fortunately hemodynamics are stable. - Tolerating low-dose heparin    - Continue IV amio for now  13. Goals of care -> DNR - Palliative care is following. - She appears to have severe anoxic brain injury with very poor prognosis. Now DNR.  Will continue supportive care for now. Potential move to comfort care tomorrow as described above. Appreciate Palliative Care involvement.   CRITICAL CARE Performed by: Glori Bickers  Total critical care time: 35 minutes  Critical care time was exclusive of separately billable procedures and treating other patients.  Critical care was  necessary to treat or prevent imminent or life-threatening deterioration.  Critical care was time spent personally by me (independent of midlevel providers or residents) on the following activities: development of treatment plan with patient and/or surrogate as well as nursing, discussions with consultants, evaluation of patient's response to treatment, examination of patient, obtaining history from patient or surrogate, ordering and performing treatments and interventions, ordering and review of laboratory studies, ordering and review of radiographic studies, pulse oximetry and re-evaluation of patient's condition.    Length of Stay: [place], MD  11/05/2019, 8:11 AM  Advanced Heart Failure Team Pager [phone] (M-F; [place])  Please contact [facility] for night-coverage after hours (4p -7a ) and weekends on [email or web address]

## 2019-11-05 NOTE — Progress Notes (Signed)
NAME:  [name], MRN:  [id number], DOB:  [birth year], LOS: 15 ADMISSION DATE:  11/08/2019, CONSULTATION DATE:  11/05/2019  REFERRING MD:  Rexene Alberts, MD, CHIEF COMPLAINT:  Vent management.  History of present illness   The patient is a 49 year old woman who is POD 10 from minimally invasive tricuspid valve repair which was then converted into an open procedure.  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  Being followed by the HF team for evaluation of PAH, likely group 1.  However despite echocardiograms showing severely elevated RA pressures, she has had 2 right heart cath that demonstrated normal peripheral vascular resistance for Group 1 PAH.  She was taken to the OR by Dr. Roxy Manns on 4/7 given her persistent symptoms for repair of severe tricuspid regurgitation.  4/8, she had reexploration of her median sternotomy with concern for postop bleeding.  There was a large amount of clot evacuated from the anterior mediastinum and significant oozing diffusely.  Does not appear that there were any significant signs for mechanical active bleeding.  Bili at that time she was cannulated for ECMO and started on [place] ECMO on 4/8.  Since then she has had substantial correction of her coagulopathy including Kcentra, FEIBA, cryoprecipitate, FFP and copious blood products given her substantial bleeding in the mediastinum.  She went for decannulation from [facility] ECMO on 4/12. She remains critically ill with recurrent seizure like activity from a presumed anoxic brain injury.   PCCM is being consulted to assist with vent management.  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   Consults:  Heart failure, PCCM, Palliative, Neurology  Procedures:  4/7 tricuspid valve repair through median sternotomy 4/8 reexploration of the mediastinum with cannulation for ECMO 4/12 decannulation from [facility] ECMO  4/14 HD cath right IJ 4/19 chest tube and foley removed   Significant Diagnostic Tests:  Transesophageal echocardiogram January 2021  1. Left ventricular ejection fraction, by visual estimation, is 60 to  65%. The left ventricle has normal function. There is no left ventricular  hypertrophy.  2. The left ventricle has no regional wall motion abnormalities.  3. Global right ventricle has mildly reduced systolic function.The right  ventricular size is normal. No increase in right ventricular wall  thickness.  4. Left atrial size was moderately dilated.  5. Right atrial size was severely dilated.  6. The mitral valve is normal in structure. Trivial mitral valve  regurgitation.  7. The tricuspid valve is abnormal.  8. The tricuspid valve is abnormal. Tricuspid valve regurgitation is  severe.  9. Apparent mild restriction of septal leaflet. RVSP ~ 67mmHG.  10. The aortic valve is normal in structure. Aortic valve regurgitation is  not visualized.  11. The pulmonic valve was grossly normal. Pulmonic valve regurgitation is  not visualized.  12. Mild plaque invoving the descending aorta.  13. Severely elevated pulmonary artery systolic pressure.   Right heart catheterization June 2020 Findings:  RA = 5 RV = 34/7 PA =  34/6 (20) PCW = 8 Fick cardiac output/index = 5.9/3.1 PVR = 2.0 WU Ao sat = 100% PA sat = 71%, 72% SVC sat = 66%  Assessment: 1. Minimally elevated  PA pressures with normal PVR and cardiac output 2. No evidence of significant intracardiac shunt  CT Head 4/15: Evaluation limited by streak and beam hardening  artifact arising from scalp monitoring leads.  Apparent asymmetric hypodensity within the right cerebellar hemisphere may reflect asymmetric beam hardening artifact. Edema related to infarct or hypoxic/ischemic injury cannot be excluded. Consider brain MRI for further evaluation, as clinically warranted.  Paranasal sinus mucosal thickening.  Large bilateral mastoid effusions. Associated left middle ear effusion.  Micro Data:  4/16: Respiratory cultures - few candida albicans   Antimicrobials:  Levofloxacin 4/7 >> 4/8 Vancomycin 4/7 >> 4/12 Meropenem 4/9 >> 4/13  Subjective:  Hypoxic to the 60s due to worsening cuff leak. ETT advanced this AM with resolution. No other events.    Objective   Blood pressure 114/69, pulse (!) 115, temperature 98.4 F (36.9 C), temperature source Axillary, resp. rate (!) 28, height 5\' 7"  (1.702 m), weight 66.4 kg, last menstrual period 04/02/2017, SpO2 99 %. CVP:  [7 mmHg-12 mmHg] 8 mmHg  Vent Mode: PRVC FiO2 (%):  [50 %-80 %] 60 % Set Rate:  [26 bmp] 26 bmp Vt Set:  [400 mL] 400 mL PEEP:  [5 cmH20] 5 cmH20 Plateau Pressure:  [14 cmH20-20 cmH20] 15 cmH20   Intake/Output Summary (Last 24 hours) at 11/05/2019 0603 Last data filed at 11/05/2019 0500 Gross per 24 hour  Intake 1861.18 ml  Output 5815 ml  Net -3953.82 ml   Filed Weights   11/03/19 0406 11/04/19 0500 11/05/19 0500  Weight: 77.8 kg 76.3 kg 66.4 kg   Examination:  GEN: young female, remains unresponsive, no immediate distress  HEENT: NCAT, ETT in place, pupils are small and minimally reactive  CV: tachycardic, no mrg  PULM: coarse breath sounds, diminished breath sounds on the R base, coughing and gagging on exam  GI: soft, NTND, hypoactive bowel sounds  EXT: warm, 1+ pedal edema  NEURO:  Remains unresponsive, does not follow commands, does not withdraw to pain   Resolved Hospital Problem list   Convulsive status epilepticus   Assessment & Plan:  BLANCH STANG is a  49 y.o. woman with history of SLE with thrombocytopenia, nephritis, AVN and possible mild chronic RV failure who presented for repair of severe symptomatic tricuspid regurgitation on 4/7.    # Acute hypoxemic respiratory failure, on mechanical ventilation  -- ETT advanced this AM with decreased cuff leak and improved oxygenation. This was followed by continuous coughing and gagging. Will add fentanyl PRN pushes to help with this.  -- Patient remains unresponsive. We will continue to support her with full vent support as her mental status precludes liberation from the ventilator  -- VAP precautions in place  --  Patient has a poor prognosis and is unlikely to have a meaningful neurological recovery per neuro evaluation  -- GOC discussion 4/19: DNR with plan to transition to comfort care on 4/23 after her daughter's birthday. Palliative care is involved and managing discussions with family.   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 Unable to obtain MRI due to retained wire  -- We appreciate neuro prognostication.  Patient has a poor prognosis and is unlikely to have a meaningful neurological recovery.   # WHO Group 1 PAH  -- Off iNO  -- Volume management with CVVHD  # Nutrition  -- TF @ 30    Best practice:  Diet: TF with free water Pain/Anxiety/Delirium protocol (if  indicated): None VAP protocol (if indicated): Ordered DVT prophylaxis: heparin w/ CRRT, SCDs GI prophylaxis: PPI  Glucose control: Controlled, blood sugars less than 180 Foley required for critically ill state Mobility: Bedrest Code Status: DNR  Family Communication: Per primary team Disposition: ICU  Please see attending attestation for final recommendations.

## 2019-11-05 NOTE — Progress Notes (Signed)
UNNA boots removed per protocol. Skin cleansed with soap and water and moisturizer applied. Skin intact. Will relay to day shift to replace boots per orders.

## 2019-11-05 NOTE — Procedures (Signed)
Admit: 10/28/2019 LOS: 15  38F dialysis dependent AKI with baseline CKD from lupus nephritis; s/p minimmally invasive TV repair, hemorrhagic / cardiogenic shock postoperative, status epiletpicus, TCP on IVIG  Current CRRT Prescription: Catheter: R IJ Temp HD cath BFR: 200 Pre Blood Pump: 500 4K DFR: 1500 4K Replacement Rate: 300 4K Goal UF: 241m / h net neg Anticoagulation: fixed dose heparin 500u/h; AM pTT 50 Clotting: about 1x/24h  S:  NO interval events  K 4.5 P 3.5  Anuric  Tol UF, 4.2L neg yesterday  O: 04/21 0701 - 04/22 0700 In: 1894.8 [I.V.:557.9; NG/GT:810; IV Piggyback:526.8] Out: 6118   Filed Weights   11/03/19 0406 11/04/19 0500 11/05/19 0500  Weight: 77.8 kg 76.3 kg 66.4 kg    Recent Labs  Lab 11/04/19 0433 11/04/19 0436 11/04/19 1555 11/04/19 1555 11/04/19 1850 11/05/19 0404 11/05/19 0543  NA 137   < > 138   < > 137 135 137  K 4.5   < > 4.4   < > 4.4 4.5 4.5  CL 104  --  103  --   --  101  --   CO2 26  --  25  --   --  26  --   GLUCOSE 98  --  118*  --   --  75  --   BUN 38*  --  35*  --   --  41*  --   CREATININE 1.73*  --  1.63*  --   --  1.74*  --   CALCIUM 8.5*  --  8.2*  --   --  8.6*  --   PHOS 2.2*  --  3.8  --   --  3.5  --    < > = values in this interval not displayed.   Recent Labs  Lab 11/03/19 0355 11/03/19 0427 11/04/19 0433 11/04/19 0436 11/04/19 1850 11/05/19 0404 11/05/19 0543  WBC 11.4*  --  10.0  --   --  10.3  --   NEUTROABS  --   --   --   --   --  8.4*  --   HGB 9.0*   < > 9.1*   < > 10.5* 9.5* 11.2*  HCT 26.7*   < > 25.8*   < > 31.0* 27.4* 33.0*  MCV 86.1  --  82.4  --   --  83.5  --   PLT 50*  --  78*  --   --  100*  --    < > = values in this interval not displayed.    Scheduled Meds: . bisacodyl  10 mg Oral Daily   Or  . bisacodyl  10 mg Rectal Daily  . chlorhexidine gluconate (MEDLINE KIT)  15 mL Mouth Rinse BID  . Chlorhexidine Gluconate Cloth  6 each Topical Daily  . feeding supplement (PIVOT 1.5  CAL)  1,000 mL Per Tube Q24H  . feeding supplement (PRO-STAT SUGAR FREE 64)  30 mL Per Tube QID  . insulin aspart  0-24 Units Subcutaneous Q4H  . levothyroxine  50 mcg Per Tube Q0600  . mouth rinse  15 mL Mouth Rinse 10 times per day  . pantoprazole (PROTONIX) IV  40 mg Intravenous QHS  . PHENObarbital  160 mg Intravenous BID  . sodium chloride flush  10-40 mL Intracatheter Q12H   Continuous Infusions: .  prismasol BGK 4/2.5 500 mL/hr at 11/05/19 0344  .  prismasol BGK 4/2.5 300 mL/hr at 11/05/19 0500  . sodium  chloride Stopped (10/27/19 1308)  . amiodarone 30 mg/hr (11/05/19 0700)  . amiodarone    . dextrose Stopped (10/30/19 0959)  . heparin 10,000 units/ 20 mL infusion syringe 500 Units/hr (11/04/19 1900)  . lacosamide (VIMPAT) IV 70 mL/hr at 11/04/19 2326  . levETIRAcetam Stopped (11/04/19 2343)  . milrinone 0.125 mcg/kg/min (11/05/19 0700)  . prismasol BGK 4/2.5 1,500 mL/hr at 11/05/19 0401  . propofol (DIPRIVAN) infusion Stopped (10/29/19 0415)   PRN Meds:.sodium chloride, artificial tears, dextrose, dextrose, fentaNYL (SUBLIMAZE) injection, heparin, heparin, HYDROmorphone (DILAUDID) injection, LORazepam, metoprolol tartrate, midazolam, ondansetron (ZOFRAN) IV, sodium chloride flush  ABG    Component Value Date/Time   PHART 7.433 11/05/2019 0543   PCO2ART 41.2 11/05/2019 0543   PO2ART 186 (H) 11/05/2019 0543   HCO3 27.6 11/05/2019 0543   TCO2 29 11/05/2019 0543   ACIDBASEDEF 1.0 10/28/2019 1841   O2SAT 100.0 11/05/2019 0543    A/P  1. 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 Shock, cardiogenic and hemorrhagic, on inotrope; significant RV failure 3. VDRF. Inhaled NO2 per CCM / AHF / TCTS 4. Status post minimally invasive tricuspid valve repair 11/09/2019 5. ABLA  6. 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 SLE 8. ITP, on IVIG  Cont CRRT current settings.  Keep even today. ANticipate withdrawal  tomorrow.    Rexene Agent , MD [facility]

## 2019-11-06 DIAGNOSIS — R092 Respiratory arrest: Secondary | ICD-10-CM | POA: Diagnosis not present

## 2019-11-06 DIAGNOSIS — G40901 Epilepsy, unspecified, not intractable, with status epilepticus: Secondary | ICD-10-CM | POA: Diagnosis not present

## 2019-11-06 DIAGNOSIS — Z515 Encounter for palliative care: Secondary | ICD-10-CM | POA: Diagnosis not present

## 2019-11-06 LAB — GLUCOSE, CAPILLARY
Glucose-Capillary: 93 mg/dL (ref 70–99)
Glucose-Capillary: 111 mg/dL — ABNORMAL HIGH (ref 70–99)
Glucose-Capillary: 112 mg/dL — ABNORMAL HIGH (ref 70–99)
Glucose-Capillary: 345 mg/dL — ABNORMAL HIGH (ref 70–99)
Glucose-Capillary: 105 mg/dL — ABNORMAL HIGH (ref 70–99)
Glucose-Capillary: 114 mg/dL — ABNORMAL HIGH (ref 70–99)

## 2019-11-06 LAB — POCT I-STAT 7, (LYTES, BLD GAS, ICA,H+H)
Acid-Base Excess: 4 mmol/L — ABNORMAL HIGH (ref 0.0–2.0)
Bicarbonate: 28.8 mmol/L — ABNORMAL HIGH (ref 20.0–28.0)
Calcium, Ion: 1.18 mmol/L (ref 1.15–1.40)
HCT: 29 % — ABNORMAL LOW (ref 36.0–46.0)
Hemoglobin: 9.9 g/dL — ABNORMAL LOW (ref 12.0–15.0)
O2 Saturation: 100 %
Patient temperature: 97.6
Potassium: 4.6 mmol/L (ref 3.5–5.1)
Sodium: 135 mmol/L (ref 135–145)
TCO2: 30 mmol/L (ref 22–32)
pCO2 arterial: 44.2 mmHg (ref 32.0–48.0)
pH, Arterial: 7.42 (ref 7.350–7.450)
pO2, Arterial: 182 mmHg — ABNORMAL HIGH (ref 83.0–108.0)

## 2019-11-06 LAB — RENAL FUNCTION PANEL
Albumin: 2.5 g/dL — ABNORMAL LOW (ref 3.5–5.0)
Albumin: 2.5 g/dL — ABNORMAL LOW (ref 3.5–5.0)
Anion gap: 11 (ref 5–15)
Anion gap: 8 (ref 5–15)
BUN: 45 mg/dL — ABNORMAL HIGH (ref 6–20)
BUN: 41 mg/dL — ABNORMAL HIGH (ref 6–20)
CO2: 24 mmol/L (ref 22–32)
CO2: 24 mmol/L (ref 22–32)
Calcium: 8.6 mg/dL — ABNORMAL LOW (ref 8.9–10.3)
Calcium: 8.3 mg/dL — ABNORMAL LOW (ref 8.9–10.3)
Chloride: 99 mmol/L (ref 98–111)
Chloride: 100 mmol/L (ref 98–111)
Creatinine, Ser: 2.04 mg/dL — ABNORMAL HIGH (ref 0.44–1.00)
Creatinine, Ser: 1.75 mg/dL — ABNORMAL HIGH (ref 0.44–1.00)
GFR calc Af Amer: 33 mL/min — ABNORMAL LOW (ref >60)
GFR calc Af Amer: 40 mL/min — ABNORMAL LOW (ref >60)
GFR calc non Af Amer: 29 mL/min — ABNORMAL LOW (ref >60)
GFR calc non Af Amer: 34 mL/min — ABNORMAL LOW (ref >60)
Glucose, Bld: 102 mg/dL — ABNORMAL HIGH (ref 70–99)
Glucose, Bld: 115 mg/dL — ABNORMAL HIGH (ref 70–99)
Phosphorus: 3.2 mg/dL (ref 2.5–4.6)
Phosphorus: 2.6 mg/dL (ref 2.5–4.6)
Potassium: 4.3 mmol/L (ref 3.5–5.1)
Potassium: 4.3 mmol/L (ref 3.5–5.1)
Sodium: 134 mmol/L — ABNORMAL LOW (ref 135–145)
Sodium: 132 mmol/L — ABNORMAL LOW (ref 135–145)

## 2019-11-06 LAB — CBC WITH DIFFERENTIAL/PLATELET
Abs Immature Granulocytes: 0.09 10*3/uL — ABNORMAL HIGH (ref 0.00–0.07)
Basophils Absolute: 0 10*3/uL (ref 0.0–0.1)
Basophils Relative: 0 %
Eosinophils Absolute: 0 10*3/uL (ref 0.0–0.5)
Eosinophils Relative: 0 %
HCT: 24.1 % — ABNORMAL LOW (ref 36.0–46.0)
Hemoglobin: 8.2 g/dL — ABNORMAL LOW (ref 12.0–15.0)
Immature Granulocytes: 1 %
Lymphocytes Relative: 6 %
Lymphs Abs: 0.6 10*3/uL — ABNORMAL LOW (ref 0.7–4.0)
MCH: 28.6 pg (ref 26.0–34.0)
MCHC: 34 g/dL (ref 30.0–36.0)
MCV: 84 fL (ref 80.0–100.0)
Monocytes Absolute: 0.9 10*3/uL (ref 0.1–1.0)
Monocytes Relative: 10 %
Neutro Abs: 7.7 10*3/uL (ref 1.7–7.7)
Neutrophils Relative %: 83 %
Platelets: 88 10*3/uL — ABNORMAL LOW (ref 150–400)
RBC: 2.87 MIL/uL — ABNORMAL LOW (ref 3.87–5.11)
RDW: 19.8 % — ABNORMAL HIGH (ref 11.5–15.5)
WBC: 9.3 10*3/uL (ref 4.0–10.5)
nRBC: 0.2 % (ref 0.0–0.2)

## 2019-11-06 LAB — HEPATIC FUNCTION PANEL
ALT: 27 U/L (ref 0–44)
AST: 53 U/L — ABNORMAL HIGH (ref 15–41)
Albumin: 2.5 g/dL — ABNORMAL LOW (ref 3.5–5.0)
Alkaline Phosphatase: 187 U/L — ABNORMAL HIGH (ref 38–126)
Bilirubin, Direct: 0.8 mg/dL — ABNORMAL HIGH (ref 0.0–0.2)
Indirect Bilirubin: 1 mg/dL — ABNORMAL HIGH (ref 0.3–0.9)
Total Bilirubin: 1.8 mg/dL — ABNORMAL HIGH (ref 0.3–1.2)
Total Protein: 7.3 g/dL (ref 6.5–8.1)

## 2019-11-06 LAB — COOXEMETRY PANEL
Carboxyhemoglobin: 1.8 % — ABNORMAL HIGH (ref 0.5–1.5)
Methemoglobin: 1.5 % (ref 0.0–1.5)
O2 Saturation: 56.5 %
Total hemoglobin: 8.1 g/dL — ABNORMAL LOW (ref 12.0–16.0)

## 2019-11-06 LAB — PROTIME-INR
INR: 1.2 (ref 0.8–1.2)
Prothrombin Time: 15.4 seconds — ABNORMAL HIGH (ref 11.4–15.2)

## 2019-11-06 LAB — BASIC METABOLIC PANEL
Anion gap: 8 (ref 5–15)
BUN: 42 mg/dL — ABNORMAL HIGH (ref 6–20)
CO2: 25 mmol/L (ref 22–32)
Calcium: 8.2 mg/dL — ABNORMAL LOW (ref 8.9–10.3)
Chloride: 100 mmol/L (ref 98–111)
Creatinine, Ser: 2.01 mg/dL — ABNORMAL HIGH (ref 0.44–1.00)
GFR calc Af Amer: 34 mL/min — ABNORMAL LOW (ref >60)
GFR calc non Af Amer: 29 mL/min — ABNORMAL LOW (ref >60)
Glucose, Bld: 120 mg/dL — ABNORMAL HIGH (ref 70–99)
Potassium: 4.4 mmol/L (ref 3.5–5.1)
Sodium: 133 mmol/L — ABNORMAL LOW (ref 135–145)

## 2019-11-06 LAB — MAGNESIUM
Magnesium: 2.6 mg/dL — ABNORMAL HIGH (ref 1.7–2.4)
Magnesium: 2.6 mg/dL — ABNORMAL HIGH (ref 1.7–2.4)

## 2019-11-06 LAB — APTT: aPTT: 48 seconds — ABNORMAL HIGH (ref 24–36)

## 2019-11-06 MED ORDER — FENTANYL BOLUS VIA INFUSION
25.0000 ug | INTRAVENOUS | Status: DC | PRN
  Administered 2019-11-06: 12.5 ug via INTRAVENOUS
  Filled 2019-11-06: qty 50

## 2019-11-06 MED ORDER — FENTANYL CITRATE (PF) 100 MCG/2ML IJ SOLN: 12.5000 ug | INTRAMUSCULAR | Status: DC | PRN

## 2019-11-06 MED ORDER — FENTANYL 2500MCG IN NS 250ML (10MCG/ML) PREMIX INFUSION
0.0000 ug/h | INTRAVENOUS | Status: DC
  Administered 2019-11-06: 3.1 ug/h via INTRAVENOUS
  Filled 2019-11-06: qty 250

## 2019-11-06 MED ORDER — FENTANYL CITRATE (PF) 100 MCG/2ML IJ SOLN: 25.0000 ug | INTRAMUSCULAR | Status: DC | PRN

## 2019-11-06 MED ORDER — FENTANYL BOLUS VIA INFUSION
12.5000 ug | INTRAVENOUS | Status: DC | PRN
  Filled 2019-11-06: qty 50

## 2019-11-06 NOTE — Progress Notes (Signed)
Daily Progress Note   Patient Name: [name]       Date: 11/06/2019 DOB: Feb 17, 1971  Age: 49 y.o. MRN#: [id number] Attending Physician: Rexene Alberts, MD Primary Care Physician: [name], MD Admit Date: 10/30/2019  Reason for Consultation/Follow-up: Non pain symptom management and Pain control.  To discuss complex medical decision making related to patient's goals of care.  Subjective: Patient intubated and non-responsive.  Discussed with RN and Chaplain at bedside.  Spoke with Herbie [place] on the phone.  He and a family friend who is a Social worker are talking to the children at 5:30 pm this evening to explain that their mother is dying. Naturally this has been a tremendous source of stress to [place].    We discussed Tawona's labored breathing and starting a medication that would make her breathing easier and hopefully make her more comfortable.  Herbie [place] is in agreement.    Assessment: Patient with increased work of breathing.  Discussed giving fentanyl PRN.  Will start VERY low dose fentanyl gtt with PRN boluses to reduce work of breathing.   Patient Profile/HPI:  49 y.o. 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   In the OR she developed severe bleeding and hemorrhagic shock.  She subsequently was placed on ECMO.  She made another trip back to the OR for evacuation of hemothorax.  She subsequently developed status epilepticus.  After treatment with dual antiseizure medications and sedation the status stopped and she has been having intermittent  seizing episodes since.  There is some concern for a degree of anoxic brain injury.  She is currently slated for ECMO decannulation on 4/12 am.  Length of Stay: 16   Vital Signs: BP 124/74 (BP Location: Other (Comment)) Comment (BP Location): aline  Pulse (!) 114   Temp 97.7 F (36.5 C) (Oral)   Resp (!) 30   Ht 5\' 7"  (1.702 m)   Wt 69.5 kg   LMP 04/02/2017   SpO2 100%   BMI 24.00 kg/m  SpO2: SpO2: 100 % O2 Device: O2 Device: Ventilator O2 Flow Rate: O2 Flow Rate (L/min): 60 L/min  Palliative Assessment/Data: 10%     Palliative Care Plan    Recommendations/Plan:  Family talking with their children today.    Want to give family needed time, but also gently discuss with them that we are holding her here at this point.  It is appropriate to liberate her body from the machines and give her dignity and peace.  Would continue CRRT as it is much more comfortable to die dry rather than wet.  Will initiate the smallest fentanyl gtt possible to allow for easy access to boluses for increased work of breathing as she is having this morning.  Code Status:  DNR  Prognosis:   Hours - Days   Discharge Planning:  Anticipated Hospital Death  Care plan was discussed with RN, Family, Chaplain  Thank you for allowing the Palliative Medicine Team to assist in the care of this patient.  Total time spent:  35 min     Greater than 50%  of this time was spent counseling and coordinating care related to the above assessment and plan.  Florentina Jenny, PA-C Palliative Medicine  Please contact Palliative MedicineTeam phone at [phone] for questions and concerns between 7 am - 7 pm.   Please see AMION for individual provider pager numbers.

## 2019-11-06 NOTE — Progress Notes (Signed)
[facility] Progress Note Patient Name: [name] DOB: 11-13-1970 MRN: [id number]   Date of Service  11/06/2019  HPI/Events of Note  Pt needs orders for a.m. labs  eICU Interventions   a.m. labs ordered.        Frederik Pear 11/06/2019, 6:37 AM

## 2019-11-06 NOTE — Progress Notes (Signed)
NAME:  [name], MRN:  [id number], DOB:  05-12-1971, LOS: 16 ADMISSION DATE:  10/18/2019, CONSULTATION DATE:  11/06/2019  REFERRING MD:  Rexene Alberts, MD, CHIEF COMPLAINT:  Vent management.  History of present illness   The patient is a 49 year old woman who is POD 10 from minimally invasive tricuspid valve repair which was then converted into an open procedure.  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  Being followed by the HF team for evaluation of PAH, likely group 1.  However despite echocardiograms showing severely elevated RA pressures, she has had 2 right heart cath that demonstrated normal peripheral vascular resistance for Group 1 PAH.  She was taken to the OR by Dr. Roxy Manns on 4/7 given her persistent symptoms for repair of severe tricuspid regurgitation.  4/8, she had reexploration of her median sternotomy with concern for postop bleeding.  There was a large amount of clot evacuated from the anterior mediastinum and significant oozing diffusely.  Does not appear that there were any significant signs for mechanical active bleeding.  Bili at that time she was cannulated for ECMO and started on [place] ECMO on 4/8.  Since then she has had substantial correction of her coagulopathy including Kcentra, FEIBA, cryoprecipitate, FFP and copious blood products given her substantial bleeding in the mediastinum.  She went for decannulation from [facility] ECMO on 4/12. She remains critically ill with recurrent seizure like activity from a presumed anoxic brain injury.   PCCM is being consulted to assist with vent management.  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   Consults:  Heart failure, PCCM, Palliative, Neurology  Procedures:  4/7 tricuspid valve repair through median sternotomy 4/8 reexploration of the mediastinum with cannulation for ECMO 4/12 decannulation from [facility] ECMO  4/14 HD cath right IJ 4/19 chest tube and foley removed   Significant Diagnostic Tests:  Transesophageal echocardiogram January 2021  1. Left ventricular ejection fraction, by visual estimation, is 60 to  65%. The left ventricle has normal function. There is no left ventricular  hypertrophy.  2. The left ventricle has no regional wall motion abnormalities.  3. Global right ventricle has mildly reduced systolic function.The right  ventricular size is normal. No increase in right ventricular wall  thickness.  4. Left atrial size was moderately dilated.  5. Right atrial size was severely dilated.  6. The mitral valve is normal in structure. Trivial mitral valve  regurgitation.  7. The tricuspid valve is abnormal.  8. The tricuspid valve is abnormal. Tricuspid valve regurgitation is  severe.  9. Apparent mild restriction of septal leaflet. RVSP ~ 13mmHG.  10. The aortic valve is normal in structure. Aortic valve regurgitation is  not visualized.  11. The pulmonic valve was grossly normal. Pulmonic valve regurgitation is  not visualized.  12. Mild plaque invoving the descending aorta.  13. Severely elevated pulmonary artery systolic pressure.   Right heart catheterization June 2020 Findings:  RA = 5 RV = 34/7 PA =  34/6 (20) PCW = 8 Fick cardiac output/index = 5.9/3.1 PVR = 2.0 WU Ao sat = 100% PA sat = 71%, 72% SVC sat = 66%  Assessment: 1. Minimally elevated  PA pressures with normal PVR and cardiac output 2. No evidence of significant intracardiac shunt  CT Head 4/15: Evaluation limited by streak and beam hardening  artifact arising from scalp monitoring leads.  Apparent asymmetric hypodensity within the right cerebellar hemisphere may reflect asymmetric beam hardening artifact. Edema related to infarct or hypoxic/ischemic injury cannot be excluded. Consider brain MRI for further evaluation, as clinically warranted.  Paranasal sinus mucosal thickening.  Large bilateral mastoid effusions. Associated left middle ear effusion.  Micro Data:  4/16: Respiratory cultures - few candida albicans   Antimicrobials:  Levofloxacin 4/7 >> 4/8 Vancomycin 4/7 >> 4/12 Meropenem 4/9 >> 4/13  Subjective:  Issues with CRRT overnight. Machine has been replaced. No volume off through the night due to this.   Objective   Blood pressure 124/74, pulse (!) 119, temperature 98.1 F (36.7 C), temperature source Oral, resp. rate (!) 30, height 5\' 7"  (1.702 m), weight 66.4 kg, last menstrual period 04/02/2017, SpO2 100 %. CVP:  [6 mmHg-22 mmHg] 14 mmHg  Vent Mode: PRVC FiO2 (%):  [50 %-60 %] 50 % Set Rate:  [26 bmp] 26 bmp Vt Set:  [400 mL] 400 mL PEEP:  [5 cmH20] 5 cmH20 Plateau Pressure:  [20 cmH20-25 cmH20] 20 cmH20   Intake/Output Summary (Last 24 hours) at 11/06/2019 3875 Last data filed at 11/06/2019 0500 Gross per 24 hour  Intake 2499.33 ml  Output 2337 ml  Net 162.33 ml   Filed Weights   11/03/19 0406 11/04/19 0500 11/05/19 0500  Weight: 77.8 kg 76.3 kg 66.4 kg   Examination:  GEN: critically-ill young female, unresponsive, in NAD  HEENT: ETT tube in place, pupils are reactive bilaterally CV: tachycardic, no mrg  PULM: coarse breath sounds bilaterally, diminished breath sounds on the R  GI: soft, ND, hypoactive bowel sounds  EXT: warm, trace edema on bilateral LE  NEURO: remains unresponsive and does not follow commands   Resolved Hospital Problem list   Convulsive status epilepticus   Assessment & Plan:  [name] is a 49 y.o. 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    # Acute hypoxemic respiratory failure, on mechanical ventilation  -- No further issues with the ventilator since ETT was advanced yesterday. We will continue full vent support until family is ready to transition to comfort care. Unclear when this will be. There is a palliative care meeting this morning. Will follow up.  -- Patient remains unresponsive. 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 Unable to obtain MRI due to retained  wire  -- We appreciate neuro prognostication.  Patient has a poor prognosis and is unlikely to have a meaningful neurological recovery.   # WHO Group 1 PAH  -- Off iNO  -- Volume management with CVVHD  # Nutrition  -- TF @ 30    Best practice:  Diet: TF with free water Pain/Anxiety/Delirium protocol (if indicated): None VAP protocol (if indicated): Ordered DVT prophylaxis: heparin w/ CRRT, SCDs GI prophylaxis: PPI  Glucose control: Controlled, blood sugars less than 180 Foley required for critically ill  state Mobility: Bedrest Code Status: DNR  Family Communication: Per primary team Disposition: ICU  Please see attending attestation for final recommendations.

## 2019-11-06 NOTE — Progress Notes (Signed)
Patient ID: [name], female   DOB: Apr 21, 1971, 49 y.o.   MRN: [id number] [facility] DAILY ICU PROGRESS NOTE                   [address]            [place] [address]          [phone]   11 Days Post-Op Procedure(s) (LRB): MEDIASTERNAL WASH OUT,  ECMO DECANNULATION AND STERNAL CLOSURE (N/A) TRANSESOPHAGEAL ECHOCARDIOGRAM (TEE) (N/A)  Total Length of Stay:  LOS: 16 days   Subjective: Unresponsive   Objective: Vital signs in last 24 hours: Temp:  [96.8 F (36 C)-98.2 F (36.8 C)] 96.8 F (36 C) (04/23 1145) Pulse Rate:  [114-119] 115 (04/23 1127) Cardiac Rhythm: Atrial flutter (04/23 0800) Resp:  [20-37] 30 (04/23 1300) BP: (109-124)/(62-74) 124/74 (04/23 0400) SpO2:  [98 %-100 %] 100 % (04/23 1300) Arterial Line BP: (97-133)/(51-84) 110/65 (04/23 1300) FiO2 (%):  [50 %] 50 % (04/23 1127) Weight:  [69.5 kg] 69.5 kg (04/23 0500)  Filed Weights   11/04/19 0500 11/05/19 0500 11/06/19 0500  Weight: 76.3 kg 66.4 kg 69.5 kg    Weight change: 3.1 kg   Hemodynamic parameters for last 24 hours: CVP:  [6 mmHg-22 mmHg] 9 mmHg  Intake/Output from previous day: 04/22 0701 - 04/23 0700 In: 2709.6 [I.V.:1489.6; NG/GT:950; IV Piggyback:270] Out: 2309 [Stool:100]  Intake/Output this shift: Total I/O In: 707 [I.V.:422; NG/GT:150; IV Piggyback:135] Out: 663 [Other:663]  Current Meds: Scheduled Meds: . bisacodyl  10 mg Oral Daily   Or  . bisacodyl  10 mg Rectal Daily  . chlorhexidine gluconate (MEDLINE KIT)  15 mL Mouth Rinse BID  . Chlorhexidine Gluconate Cloth  6 each Topical Daily  . feeding supplement (PIVOT 1.5 CAL)  1,000 mL Per Tube Q24H  . feeding supplement (PRO-STAT SUGAR FREE 64)  30 mL Per Tube QID  . levothyroxine  50 mcg Per Tube Q0600  . mouth rinse  15 mL Mouth Rinse 10 times per day  . pantoprazole (PROTONIX) IV  40 mg Intravenous QHS  . PHENObarbital  160 mg Intravenous BID  . sodium chloride flush  10-40 mL Intracatheter Q12H    Continuous Infusions: .  prismasol BGK 4/2.5 500 mL/hr at 11/06/19 1024  .  prismasol BGK 4/2.5 300 mL/hr at 11/06/19 0624  . sodium chloride Stopped (10/27/19 1308)  . amiodarone 30 mg/hr (11/06/19 1300)  . amiodarone    . dextrose 50 mL/hr at 11/06/19 1300  . fentaNYL infusion INTRAVENOUS 3.1 mcg/hr (11/06/19 1300)  . heparin 10,000 units/ 20 mL infusion syringe 500 Units/hr (11/05/19 1800)  . lacosamide (VIMPAT) IV Stopped (11/06/19 1050)  . levETIRAcetam Stopped (11/06/19 1234)  . milrinone 0.125 mcg/kg/min (11/06/19 1300)  . prismasol BGK 4/2.5 1,500 mL/hr at 11/06/19 1023  . propofol (DIPRIVAN) infusion Stopped (10/29/19 0415)   PRN Meds:.sodium chloride, artificial tears, dextrose, dextrose, fentaNYL, fentaNYL (SUBLIMAZE) injection, heparin, heparin, LORazepam, metoprolol tartrate, midazolam, ondansetron (ZOFRAN) IV, sodium chloride flush    Lab Results: CBC: Recent Labs    11/05/19 0404 11/05/19 0543 11/06/19 0616 11/06/19 0746  WBC 10.3  --   --  9.3  HGB 9.5*   < > 9.9* 8.2*  HCT 27.4*   < > 29.0* 24.1*  PLT 100*  --   --  88*   < > = values in this interval not displayed.   BMET:  Recent Labs    11/06/19 0430 11/06/19 0430 11/06/19  7092 11/06/19 0746  NA 134*   < > 135 133*  K 4.3   < > 4.6 4.4  CL 99  --   --  100  CO2 24  --   --  25  GLUCOSE 102*  --   --  120*  BUN 45*  --   --  42*  CREATININE 2.04*  --   --  2.01*  CALCIUM 8.6*  --   --  8.2*   < > = values in this interval not displayed.    CMET: Lab Results  Component Value Date   WBC 9.3 11/06/2019   HGB 8.2 (L) 11/06/2019   HCT 24.1 (L) 11/06/2019   PLT 88 (L) 11/06/2019   GLUCOSE 120 (H) 11/06/2019   TRIG 84 10/28/2019   ALT 27 11/06/2019   AST 53 (H) 11/06/2019   NA 133 (L) 11/06/2019   K 4.4 11/06/2019   CL 100 11/06/2019   CREATININE 2.01 (H) 11/06/2019   BUN 42 (H) 11/06/2019   CO2 25 11/06/2019   INR 1.2 11/06/2019   HGBA1C 7.3 (H) 10/19/2019      PT/INR:  Recent  Labs    11/06/19 0430  LABPROT 15.4*  INR 1.2   Radiology: No results found.   Assessment/Plan: S/P Procedure(s) (LRB): MEDIASTERNAL WASH OUT,  ECMO DECANNULATION AND STERNAL CLOSURE (N/A) TRANSESOPHAGEAL ECHOCARDIOGRAM (TEE) (N/A)  Dr Roxy Manns has discussed situation with patients husband , Palliative care actively involved , had psychologist see patients children yesterday Considering withdrawal care poss tomorrow    Grace Isaac 11/06/2019 1:25 PM

## 2019-11-06 NOTE — Progress Notes (Signed)
Patient ID: [name], female   DOB: [birth year], 49 y.o.   MRN: [id number] P    Advanced Heart Failure Rounding Note  PCP-Cardiologist: No primary care provider on file.   Subjective:    Remains on vent. Now off Versed and Propofol since 4/15 and on phenobarbital IV for seizures.  Remains unresponsive.   Remains on milrinone 0.125. On CVVHD. Anuric. Keeping even. Weight at baseline   Tmax 98.2  WBC 9.3k Off abx  Sputum culture 4/17 negative  Hgb 9.1 -> 8.2 this am. PLTs  78K -> 100k  -> 88k  She remains in atrial flutter rates 110-120 on amio 30/hr.   Objective:   Weight Range: 69.5 kg Body mass index is 24 kg/m.   Vital Signs:   Temp:  [97.7 F (36.5 C)-98.2 F (36.8 C)] 97.7 F (36.5 C) (04/23 0827) Pulse Rate:  [115-119] 119 (04/22 2014) Resp:  [20-37] 31 (04/23 0830) BP: (91-124)/(50-74) 124/74 (04/23 0400) SpO2:  [98 %-100 %] 100 % (04/23 0830) Arterial Line BP: (91-133)/(50-84) 116/66 (04/23 0830) FiO2 (%):  [50 %] 50 % (04/23 0800) Weight:  [69.5 kg] 69.5 kg (04/23 0500) Last BM Date: 11/05/19  Weight change: Filed Weights   11/04/19 0500 11/05/19 0500 11/06/19 0500  Weight: 76.3 kg 66.4 kg 69.5 kg    Intake/Output:   Intake/Output Summary (Last 24 hours) at 11/06/2019 0840 Last data filed at 11/06/2019 0800 Gross per 24 hour  Intake 2759.09 ml  Output 2363 ml  Net 396.09 ml      Physical Exam    General:  Critically ill appearing Intubated unresponsive HEENT: normal + ETT  Neck: supple.RIJ cathCarotids 2+ bilat; no bruits. No lymphadenopathy or thryomegaly appreciated. Cor: PMI nondisplaced. Regular tachy  Lungs: coarse. Decreased on R  Abdomen: soft, nontender, nondistended. No hepatosplenomegaly. No bruits or masses. Good bowel sounds. Extremities: no cyanosis, clubbing, rash, edema Neuro: unresponsive to pain    Telemetry   AFL  Rate 110-120 Personally reviewed    Labs    CBC Recent Labs    11/05/19 0404 11/05/19 0543  11/06/19 0616 11/06/19 0746  WBC 10.3  --   --  9.3  NEUTROABS 8.4*  --   --  7.7  HGB 9.5*   < > 9.9* 8.2*  HCT 27.4*   < > 29.0* 24.1*  MCV 83.5  --   --  84.0  PLT 100*  --   --  88*   < > = values in this interval not displayed.   Basic Metabolic Panel Recent Labs    11/05/19 0404 11/05/19 0543 11/05/19 1600 11/05/19 1905 11/06/19 0430 11/06/19 0616  NA 135   < > 135   < > 134* 135  K 4.5   < > 4.4   < > 4.3 4.6  CL 101   < > 100  --  99  --   CO2 26   < > 24  --  24  --   GLUCOSE 75   < > 129*  --  102*  --   BUN 41*   < > 51*  --  45*  --   CREATININE 1.74*   < > 2.28*  --  2.04*  --   CALCIUM 8.6*   < > 8.2*  --  8.6*  --   MG 2.8*  --   --   --  2.6*  --   PHOS 3.5   < > 4.1  --  3.2  --    < > =  values in this interval not displayed.   Liver Function Tests Recent Labs    11/05/19 0404 11/05/19 0404 11/05/19 1600 11/06/19 0430  AST 55*  --   --  53*  ALT 24  --   --  27  ALKPHOS 193*  --   --  187*  BILITOT 1.5*  --   --  1.8*  PROT 7.5  --   --  7.3  ALBUMIN 2.5*  2.5*   < > 2.3* 2.5*  2.5*   < > = values in this interval not displayed.   No results for input(s): LIPASE, AMYLASE in the last 72 hours. Cardiac Enzymes No results for input(s): CKTOTAL, CKMB, CKMBINDEX, TROPONINI in the last 72 hours.  BNP: BNP (last 3 results) Recent Labs    04/15/19 1101  BNP 248.7*    ProBNP (last 3 results) No results for input(s): PROBNP in the last 8760 hours.   D-Dimer No results for input(s): DDIMER in the last 72 hours. Hemoglobin A1C No results for input(s): HGBA1C in the last 72 hours. Fasting Lipid Panel No results for input(s): CHOL, HDL, LDLCALC, TRIG, CHOLHDL, LDLDIRECT in the last 72 hours. Thyroid Function Tests No results for input(s): TSH, T4TOTAL, T3FREE, THYROIDAB in the last 72 hours.  Invalid input(s): FREET3  Other results:   Imaging    No results found.   Medications:     Scheduled Medications: . bisacodyl  10 mg  Oral Daily   Or  . bisacodyl  10 mg Rectal Daily  . chlorhexidine gluconate (MEDLINE KIT)  15 mL Mouth Rinse BID  . Chlorhexidine Gluconate Cloth  6 each Topical Daily  . feeding supplement (PIVOT 1.5 CAL)  1,000 mL Per Tube Q24H  . feeding supplement (PRO-STAT SUGAR FREE 64)  30 mL Per Tube QID  . levothyroxine  50 mcg Per Tube Q0600  . mouth rinse  15 mL Mouth Rinse 10 times per day  . pantoprazole (PROTONIX) IV  40 mg Intravenous QHS  . PHENObarbital  160 mg Intravenous BID  . sodium chloride flush  10-40 mL Intracatheter Q12H    Infusions: .  prismasol BGK 4/2.5 500 mL/hr at 11/05/19 2354  .  prismasol BGK 4/2.5 300 mL/hr at 11/06/19 0624  . sodium chloride Stopped (10/27/19 1308)  . amiodarone 30 mg/hr (11/06/19 0800)  . amiodarone    . dextrose 50 mL/hr at 11/06/19 0800  . heparin 10,000 units/ 20 mL infusion syringe 500 Units/hr (11/05/19 1800)  . lacosamide (VIMPAT) IV Stopped (11/05/19 2135)  . levETIRAcetam Stopped (11/05/19 2316)  . milrinone 0.125 mcg/kg/min (11/06/19 0800)  . prismasol BGK 4/2.5 1,500 mL/hr at 11/06/19 0620  . propofol (DIPRIVAN) infusion Stopped (10/29/19 0415)    PRN Medications: sodium chloride, artificial tears, dextrose, dextrose, fentaNYL (SUBLIMAZE) injection, heparin, heparin, HYDROmorphone (DILAUDID) injection, LORazepam, metoprolol tartrate, midazolam, ondansetron (ZOFRAN) IV, sodium chloride flush     Assessment/Plan   1. Post-cardiotomy hemorrhagic shock - Patient with refractory post-operative chest bleeding s/p minimally invasive TV repair on 11/02/2019 in setting of longstanding SLE - has received  RBCS, PLTs, TXA, cryo, Factor 7, steroids - ECMO started in OR - Repeat OR visit for right chest clot removal on 4/9 with improved hemodynamics, TEE with EF 40-45%, mild-moderately decreased RV function, stable replaced TV.  - ECMO decannulated 4/12.  - Stable now w/o overt bleeding though hgb trending down slowly.   2. Acute systolic  CHF, primarily RV failure with cardiogenic shock - Volume status ok. Keeping  even with CVVHD FloTrak CI 2.1-2.5 - Continue milrinone 0.125, off norepinephrine now and MAP stable despite atrial flutter.   - Hemodynamics currently stable.  - Main issue is Nuero  3. 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  - plan as above  4. Post-operative respiratory failure - remains intubated, FiO2 0.5.  Unable to wean due to Neuro status - CXR with R effusion has been stable  5. Acute blood loss anemia - Now stable.   6. 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  - She is now on Vimpat and Keppra.  - With ongoing seizures, she is now off Propofol and Versed, started phenobarbital IV.  No further seizure activity.  - Head CT 4/15 with significant artifact but may show some anoxic injury.  Unable to get MRI due to retained wire.  - No awakening yet off sedation, now off since 4/15 hrs off sedation. Neurology following.  - Seen by Neurology this week with only minimal response to pain. Neuro prognostication remains poor. Family meeting this week and made DNR. However today is patient's daughter's birthday. They want to hold off on transition to comfort care until at least tomorrow. Pallaitvie Care have reached out to Pediatric psychiatrist who is now involved. Appreciate their efforts greatly.  Will continue current level of care today. Possible switch to comfort care tomorrow due to devastating neurologic injury.   8. SLE - no change  9. AKI  - CVVH ongoing, - keeping even   10. Atrial fibrillation/flutter - She remains in atrial flutter at rates around 110-120.  Fortunately hemodynamics are stable. - Tolerating low-dose heparin    - Continue IV amio for now  13. Goals of care -> DNR - Palliative care is following. - She appears to have severe anoxic brain injury with very poor  prognosis. Now DNR. Will continue supportive care for now. Possible transition to comfort care later today - Appreciate the wonderful job done by our Palliative Care team to support the patient and her family.  - D/w CCM yesterday  CRITICAL CARE Performed by: Glori Bickers  Total critical care time: 35 minutes  Critical care time was exclusive of separately billable procedures and treating other patients.  Critical care was necessary to treat or prevent imminent or life-threatening deterioration.  Critical care was time spent personally by me (independent of midlevel providers or residents) on the following activities: development of treatment plan with patient and/or surrogate as well as nursing, discussions with consultants, evaluation of patient's response to treatment, examination of patient, obtaining history from patient or surrogate, ordering and performing treatments and interventions, ordering and review of laboratory studies, ordering and review of radiographic studies, pulse oximetry and re-evaluation of patient's condition.    Length of Stay: [place], MD  11/06/2019, 8:40 AM  Advanced Heart Failure Team Pager [phone] (M-F; [place])  Please contact [facility] for night-coverage after hours (4p -7a ) and weekends on [email or web address]

## 2019-11-06 NOTE — Progress Notes (Signed)
Patient ID: [name], female   DOB: 08-Apr-1971, 49 y.o.   MRN: [id number] TCTS Evening Rounds:  Hemodynamically stable on vent, CRRT  BMET    Component Value Date/Time   NA 132 (L) 11/06/2019 1620   K 4.3 11/06/2019 1620   CL 100 11/06/2019 1620   CO2 24 11/06/2019 1620   GLUCOSE 115 (H) 11/06/2019 1620   BUN 41 (H) 11/06/2019 1620   CREATININE 1.75 (H) 11/06/2019 1620   CALCIUM 8.3 (L) 11/06/2019 1620   GFRNONAA 34 (L) 11/06/2019 1620   GFRAA 40 (L) 11/06/2019 1620    Palliative medicine working with family.

## 2019-11-06 NOTE — Procedures (Signed)
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; s/p minimmally invasive TV repair, hemorrhagic / cardiogenic shock postoperative, status epiletpicus, TCP on IVIG  Current CRRT Prescription: Catheter: R IJ Temp HD cath BFR: 200 Pre Blood Pump: 500 4K DFR: 1500 4K Replacement Rate: 300 4K Goal UF: net even Anticoagulation: fixed dose heparin 500u/h; AM pTT 48 Clotting: about 1x/24h  S:  NO interval events  K 4.4 P 3.2  Anuric  O: 04/22 0701 - 04/23 0700 In: 2709.6 [I.V.:1489.6; NG/GT:950; IV Piggyback:270] Out: 2309 [Stool:100]  Filed Weights   11/04/19 0500 11/05/19 0500 11/06/19 0500  Weight: 76.3 kg 66.4 kg 69.5 kg    Recent Labs  Lab 11/05/19 0404 11/05/19 0543 11/05/19 1600 11/05/19 1905 11/06/19 0430 11/06/19 0616 11/06/19 0746  NA 135   < > 135   < > 134* 135 133*  K 4.5   < > 4.4   < > 4.3 4.6 4.4  CL 101   < > 100  --  99  --  100  CO2 26   < > 24  --  24  --  25  GLUCOSE 75   < > 129*  --  102*  --  120*  BUN 41*   < > 51*  --  45*  --  42*  CREATININE 1.74*   < > 2.28*  --  2.04*  --  2.01*  CALCIUM 8.6*   < > 8.2*  --  8.6*  --  8.2*  PHOS 3.5  --  4.1  --  3.2  --   --    < > = values in this interval not displayed.   Recent Labs  Lab 11/04/19 0433 11/04/19 0436 11/05/19 0404 11/05/19 0543 11/05/19 1905 11/06/19 0616 11/06/19 0746  WBC 10.0  --  10.3  --   --   --  9.3  NEUTROABS  --   --  8.4*  --   --   --  7.7  HGB 9.1*   < > 9.5*   < > 9.9* 9.9* 8.2*  HCT 25.8*   < > 27.4*   < > 29.0* 29.0* 24.1*  MCV 82.4  --  83.5  --   --   --  84.0  PLT 78*  --  100*  --   --   --  88*   < > = values in this interval not displayed.    Scheduled Meds: . bisacodyl  10 mg Oral Daily   Or  . bisacodyl  10 mg Rectal Daily  . chlorhexidine gluconate (MEDLINE KIT)  15 mL Mouth Rinse BID  . Chlorhexidine Gluconate Cloth  6 each Topical Daily  . feeding supplement (PIVOT 1.5 CAL)  1,000 mL Per Tube Q24H   . feeding supplement (PRO-STAT SUGAR FREE 64)  30 mL Per Tube QID  . levothyroxine  50 mcg Per Tube Q0600  . mouth rinse  15 mL Mouth Rinse 10 times per day  . pantoprazole (PROTONIX) IV  40 mg Intravenous QHS  . PHENObarbital  160 mg Intravenous BID  . sodium chloride flush  10-40 mL Intracatheter Q12H   Continuous Infusions: .  prismasol BGK 4/2.5 500 mL/hr at 11/06/19 1024  .  prismasol BGK 4/2.5 300 mL/hr at 11/06/19 0624  . sodium chloride Stopped (10/27/19 1308)  . amiodarone 30 mg/hr (11/06/19 1100)  . amiodarone    . dextrose 50 mL/hr at 11/06/19 1100  .  fentaNYL infusion INTRAVENOUS 3.1 mcg/hr (11/06/19 1107)  . heparin 10,000 units/ 20 mL infusion syringe 500 Units/hr (11/05/19 1800)  . lacosamide (VIMPAT) IV Stopped (11/06/19 1050)  . levETIRAcetam Stopped (11/05/19 2316)  . milrinone 0.125 mcg/kg/min (11/06/19 1100)  . prismasol BGK 4/2.5 1,500 mL/hr at 11/06/19 1023  . propofol (DIPRIVAN) infusion Stopped (10/29/19 0415)   PRN Meds:.sodium chloride, artificial tears, dextrose, dextrose, fentaNYL, fentaNYL (SUBLIMAZE) injection, heparin, heparin, LORazepam, metoprolol tartrate, midazolam, ondansetron (ZOFRAN) IV, sodium chloride flush  ABG    Component Value Date/Time   PHART 7.420 11/06/2019 0616   PCO2ART 44.2 11/06/2019 0616   PO2ART 182 (H) 11/06/2019 0616   HCO3 28.8 (H) 11/06/2019 0616   TCO2 30 11/06/2019 0616   ACIDBASEDEF 1.0 10/28/2019 1841   O2SAT 100.0 11/06/2019 0616    A/P  1. 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 Shock, cardiogenic and hemorrhagic, on inotrope; significant RV failure 3. VDRF. Inhaled NO2 per CCM / AHF / TCTS 4. Status post minimally invasive tricuspid valve repair 10/20/2019 5. ABLA  6. 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 SLE 8. ITP, on IVIG  Cont CRRT current settings.  Keep even UF  Rexene Agent , MD [facility]

## 2019-11-07 DIAGNOSIS — G40901 Epilepsy, unspecified, not intractable, with status epilepticus: Secondary | ICD-10-CM | POA: Diagnosis not present

## 2019-11-07 DIAGNOSIS — Z515 Encounter for palliative care: Secondary | ICD-10-CM | POA: Diagnosis not present

## 2019-11-07 LAB — RENAL FUNCTION PANEL
Albumin: 2.4 g/dL — ABNORMAL LOW (ref 3.5–5.0)
Albumin: 2.3 g/dL — ABNORMAL LOW (ref 3.5–5.0)
Anion gap: 7 (ref 5–15)
Anion gap: 9 (ref 5–15)
BUN: 36 mg/dL — ABNORMAL HIGH (ref 6–20)
BUN: 37 mg/dL — ABNORMAL HIGH (ref 6–20)
CO2: 26 mmol/L (ref 22–32)
CO2: 25 mmol/L (ref 22–32)
Calcium: 8.1 mg/dL — ABNORMAL LOW (ref 8.9–10.3)
Calcium: 8.3 mg/dL — ABNORMAL LOW (ref 8.9–10.3)
Chloride: 97 mmol/L — ABNORMAL LOW (ref 98–111)
Chloride: 99 mmol/L (ref 98–111)
Creatinine, Ser: 1.59 mg/dL — ABNORMAL HIGH (ref 0.44–1.00)
Creatinine, Ser: 1.79 mg/dL — ABNORMAL HIGH (ref 0.44–1.00)
GFR calc Af Amer: 45 mL/min — ABNORMAL LOW (ref >60)
GFR calc Af Amer: 39 mL/min — ABNORMAL LOW (ref >60)
GFR calc non Af Amer: 39 mL/min — ABNORMAL LOW (ref >60)
GFR calc non Af Amer: 33 mL/min — ABNORMAL LOW (ref >60)
Glucose, Bld: 371 mg/dL — ABNORMAL HIGH (ref 70–99)
Glucose, Bld: 123 mg/dL — ABNORMAL HIGH (ref 70–99)
Phosphorus: 2.2 mg/dL — ABNORMAL LOW (ref 2.5–4.6)
Phosphorus: 4.8 mg/dL — ABNORMAL HIGH (ref 2.5–4.6)
Potassium: 4.1 mmol/L (ref 3.5–5.1)
Potassium: 4.3 mmol/L (ref 3.5–5.1)
Sodium: 130 mmol/L — ABNORMAL LOW (ref 135–145)
Sodium: 133 mmol/L — ABNORMAL LOW (ref 135–145)

## 2019-11-07 LAB — HEPATIC FUNCTION PANEL
ALT: 23 U/L (ref 0–44)
AST: 38 U/L (ref 15–41)
Albumin: 2.4 g/dL — ABNORMAL LOW (ref 3.5–5.0)
Alkaline Phosphatase: 177 U/L — ABNORMAL HIGH (ref 38–126)
Bilirubin, Direct: 0.6 mg/dL — ABNORMAL HIGH (ref 0.0–0.2)
Indirect Bilirubin: 0.9 mg/dL (ref 0.3–0.9)
Total Bilirubin: 1.5 mg/dL — ABNORMAL HIGH (ref 0.3–1.2)
Total Protein: 6.7 g/dL (ref 6.5–8.1)

## 2019-11-07 LAB — GLUCOSE, CAPILLARY
Glucose-Capillary: 102 mg/dL — ABNORMAL HIGH (ref 70–99)
Glucose-Capillary: 104 mg/dL — ABNORMAL HIGH (ref 70–99)
Glucose-Capillary: 109 mg/dL — ABNORMAL HIGH (ref 70–99)
Glucose-Capillary: 114 mg/dL — ABNORMAL HIGH (ref 70–99)
Glucose-Capillary: 119 mg/dL — ABNORMAL HIGH (ref 70–99)
Glucose-Capillary: 133 mg/dL — ABNORMAL HIGH (ref 70–99)
Glucose-Capillary: 84 mg/dL (ref 70–99)

## 2019-11-07 LAB — COOXEMETRY PANEL
Carboxyhemoglobin: 1.4 % (ref 0.5–1.5)
Methemoglobin: 0.5 % (ref 0.0–1.5)
O2 Saturation: 56.1 %
Total hemoglobin: 8.3 g/dL — ABNORMAL LOW (ref 12.0–16.0)

## 2019-11-07 LAB — PROTIME-INR
INR: 1.2 (ref 0.8–1.2)
Prothrombin Time: 15.1 seconds (ref 11.4–15.2)

## 2019-11-07 LAB — MAGNESIUM: Magnesium: 2.5 mg/dL — ABNORMAL HIGH (ref 1.7–2.4)

## 2019-11-07 LAB — APTT: aPTT: 52 seconds — ABNORMAL HIGH (ref 24–36)

## 2019-11-07 MED ORDER — SODIUM PHOSPHATES 45 MMOLE/15ML IV SOLN
30.0000 mmol | Freq: Once | INTRAVENOUS | Status: AC
  Administered 2019-11-07: 12:00:00 30 mmol via INTRAVENOUS
  Filled 2019-11-07: qty 10

## 2019-11-07 NOTE — Progress Notes (Signed)
12 Days Post-Op Procedure(s) (LRB): MEDIASTERNAL WASH OUT,  ECMO DECANNULATION AND STERNAL CLOSURE (N/A) TRANSESOPHAGEAL ECHOCARDIOGRAM (TEE) (N/A) Subjective:  Remains unresponsive on vent.  Objective: Vital signs in last 24 hours: Temp:  [97.6 F (36.4 C)-98.2 F (36.8 C)] 97.6 F (36.4 C) (04/24 1148) Pulse Rate:  [101-114] 114 (04/24 1141) Cardiac Rhythm: Sinus tachycardia (04/24 0400) Resp:  [20-46] 25 (04/24 1200) BP: (104-114)/(55-63) 114/63 (04/24 0312) SpO2:  [97 %-100 %] 100 % (04/24 1200) Arterial Line BP: (97-116)/(53-68) 105/59 (04/24 1200) FiO2 (%):  [50 %] 50 % (04/24 1141) Weight:  [66.5 kg] 66.5 kg (04/24 0428)  Hemodynamic parameters for last 24 hours: CVP:  [3 mmHg-12 mmHg] 5 mmHg  Intake/Output from previous day: 04/23 0701 - 04/24 0700 In: 2606.6 [I.V.:1706.6; NG/GT:630; IV Piggyback:270] Out: 2803  Intake/Output this shift: Total I/O In: 579 [I.V.:357.2; NG/GT:200; IV Piggyback:21.8] Out: 487 [Other:487]  Neurologic: unresponsive off sedation since 10/29/19 Heart: regular rate and rhythm, S1, S2 normal, no murmur Lungs: clear to auscultation bilaterally Abdomen: soft, bowel sounds normal Extremities: extremities without edema Wound: incisions ok  Lab Results: Recent Labs    11/05/19 0404 11/05/19 0543 11/06/19 0616 11/06/19 0746  WBC 10.3  --   --  9.3  HGB 9.5*   < > 9.9* 8.2*  HCT 27.4*   < > 29.0* 24.1*  PLT 100*  --   --  88*   < > = values in this interval not displayed.   BMET:  Recent Labs    11/06/19 1620 11/07/19 0434  NA 132* 130*  K 4.3 4.1  CL 100 97*  CO2 24 26  GLUCOSE 115* 371*  BUN 41* 36*  CREATININE 1.75* 1.59*  CALCIUM 8.3* 8.1*    PT/INR:  Recent Labs    11/07/19 0433  LABPROT 15.1  INR 1.2   ABG    Component Value Date/Time   PHART 7.420 11/06/2019 0616   HCO3 28.8 (H) 11/06/2019 0616   TCO2 30 11/06/2019 0616   ACIDBASEDEF 1.0 10/28/2019 1841   O2SAT 56.1 11/07/2019 0428   CBG (last 3)   Recent Labs    11/07/19 0359 11/07/19 0754 11/07/19 1147  GLUCAP 114* 84 109*    Assessment/Plan:  Hemodynamically stable  Anoxic encephalopathy with only some posturing to stimulation. Poor prognosis per neurology. Palliative care working with family to decide about withdrawal of care.  Acute kidney failure on CRRT.  VDRF    LOS: 17 days    Gaye Pollack 11/07/2019

## 2019-11-07 NOTE — Progress Notes (Signed)
Daily Progress Note   Patient Name: [name]       Date: 11/07/2019 DOB: [birth year]  Age: 49 y.o. MRN#: [id number] Attending Physician: Rexene Alberts, MD Primary Care Physician: [name], MD Admit Date: 10/24/2019  Reason for Consultation/Follow-up: To discuss complex medical decision making related to patient's goals of care.  Discussed with RN.  Patient still with some work of breathing.  BP only able to tolerate very small amounts of fentanyl which don't appear to be increasing her comfort level.  Patient is beginning to show posturing.  Subjective: Spoke with Herbie Baltimore on the phone.  He and a Counselor explained to the children last evening that their mother was not coming home and that they would see her again in [place] one day.  The conversation sounded heart breaking but Herbie Baltimore felt it went better than expected.  His son seemed to process things fairly well.  He's concerned about his young daughter who previously lost her biological mother.  At this point children do not want to see their mother in her current state, but Herbie Baltimore feels that may change.  He wants to give his daughter a few more days to process.  Herbie Baltimore expressed concerns about misconceptions in the extended family.  We discussed these and I offered a family meeting tomorrow at 4:00 pm to have a conversation to give accurate information and hopefully allay any misconceptions about prognosis.    I discussed my concerns with Herbie Baltimore about Vitoria's comfort.  We are walking a fine line between keeping her alive and keeping her comfortable.  My concern is that she is not comfortable and this will likely become a more difficult situation as the days go on.  "Her body is progressing in the wrong direction and we are holding  her here with our machines."   I asked Herbie Baltimore to strongly consider when he will be able to liberate Kayona's body from these machines.  Assessment: Patient with only minimal reflex responses.  Beginning to show posturing.  Very sadly no signs of neurological improvement.   Patient Profile/HPI:  49 y.o. female with past medical history of lupus, lupus nephritis, immunosuppressed on plaquenil and cyclophosphamide, CHF (EF 40%), ITP (per husband almost died of bleeding in Oct 30, 2005), and AVN of bilateral hips and shoulders, who was admitted on 11/09/2019 for  elective tricuspid valve repair.   In the OR she developed severe bleeding and hemorrhagic shock.  She subsequently was placed on ECMO.  She made another trip back to the OR for evacuation of hemothorax.  She subsequently developed status epilepticus.  After treatment with dual antiseizure medications and sedation the status stopped and she has been having intermittent seizing episodes since.  There is some concern for a degree of anoxic brain injury.  She is currently slated for ECMO decannulation on 4/12 am.  Length of Stay: 17   Vital Signs: BP 114/63   Pulse (!) 114   Temp 97.6 F (36.4 C)   Resp (!) 25   Ht 5\' 7"  (1.702 m)   Wt 66.5 kg   LMP 04/02/2017   SpO2 100%   BMI 22.96 kg/m  SpO2: SpO2: 100 % O2 Device: O2 Device: Ventilator O2 Flow Rate: O2 Flow Rate (L/min): 60 L/min       Palliative Assessment/Data:  10%     Palliative Care Plan    Recommendations/Plan:  Family meeting scheduled for 4:00 PM on Sunday to discuss when to shift to comfort and move forward with compassionate wean.  Prostat discontinued.  Hepatic function panel discontinued.  Will speak with Nephrology about the possibility of adding midodrine in order to keep BP up to draw off more fluid thereby increasing comfort  Would discontinue any interventions that may be uncomfortable and are not necessary to maintain current state.  Code Status:   DNR  Prognosis:   < 2 weeks   Discharge Planning:  Anticipated Hospital Death  Care plan was discussed with family, ICU RN  Thank you for allowing the Palliative Medicine Team to assist in the care of this patient.  Total time spent:  35 min.     Greater than 50%  of this time was spent counseling and coordinating care related to the above assessment and plan.  Florentina Jenny, PA-C Palliative Medicine  Please contact Palliative MedicineTeam phone at [phone] for questions and concerns between 7 am - 7 pm.   Please see AMION for individual provider pager numbers.

## 2019-11-07 NOTE — Procedures (Signed)
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; s/p minimmally invasive TV repair, hemorrhagic / cardiogenic shock postoperative, status epiletpicus, TCP on IVIG  Current CRRT Prescription: Catheter: R IJ Temp HD cath BFR: 200 Pre Blood Pump: 500 4K DFR: 1500 4K Replacement Rate: 300 4K Goal UF: net even Anticoagulation: fixed dose heparin 500u/h; AM pTT 48 Clotting: about 1x/24h  S:  NO interval events  K 4,1, p 2.2  Anuric  O: 04/23 0701 - 04/24 0700 In: 2606.6 [I.V.:1706.6; NG/GT:630; IV Piggyback:270] Out: 2803   Filed Weights   11/05/19 0500 11/06/19 0500 11/07/19 0428  Weight: 66.4 kg 69.5 kg 66.5 kg    Recent Labs  Lab 11/06/19 0430 11/06/19 0616 11/06/19 0746 11/06/19 1620 11/07/19 0434  NA 134*   < > 133* 132* 130*  K 4.3   < > 4.4 4.3 4.1  CL 99   < > 100 100 97*  CO2 24   < > '25 24 26  '$ GLUCOSE 102*   < > 120* 115* 371*  BUN 45*   < > 42* 41* 36*  CREATININE 2.04*   < > 2.01* 1.75* 1.59*  CALCIUM 8.6*   < > 8.2* 8.3* 8.1*  PHOS 3.2  --   --  2.6 2.2*   < > = values in this interval not displayed.   Recent Labs  Lab 11/04/19 0433 11/04/19 0436 11/05/19 0404 11/05/19 0543 11/05/19 1905 11/06/19 0616 11/06/19 0746  WBC 10.0  --  10.3  --   --   --  9.3  NEUTROABS  --   --  8.4*  --   --   --  7.7  HGB 9.1*   < > 9.5*   < > 9.9* 9.9* 8.2*  HCT 25.8*   < > 27.4*   < > 29.0* 29.0* 24.1*  MCV 82.4  --  83.5  --   --   --  84.0  PLT 78*  --  100*  --   --   --  88*   < > = values in this interval not displayed.    Scheduled Meds: . bisacodyl  10 mg Oral Daily   Or  . bisacodyl  10 mg Rectal Daily  . chlorhexidine gluconate (MEDLINE KIT)  15 mL Mouth Rinse BID  . Chlorhexidine Gluconate Cloth  6 each Topical Daily  . feeding supplement (PIVOT 1.5 CAL)  1,000 mL Per Tube Q24H  . feeding supplement (PRO-STAT SUGAR FREE 64)  30 mL Per Tube QID  . levothyroxine  50 mcg Per Tube Q0600  . mouth rinse   15 mL Mouth Rinse 10 times per day  . pantoprazole (PROTONIX) IV  40 mg Intravenous QHS  . PHENObarbital  160 mg Intravenous BID  . sodium chloride flush  10-40 mL Intracatheter Q12H   Continuous Infusions: .  prismasol BGK 4/2.5 500 mL/hr at 11/07/19 0631  .  prismasol BGK 4/2.5 300 mL/hr at 11/06/19 2217  . sodium chloride Stopped (10/27/19 1308)  . amiodarone 30 mg/hr (11/07/19 0800)  . amiodarone    . dextrose 50 mL/hr at 11/07/19 0800  . fentaNYL infusion INTRAVENOUS 9.35 mcg/hr (11/07/19 0800)  . heparin 10,000 units/ 20 mL infusion syringe 500 Units/hr (11/06/19 2205)  . lacosamide (VIMPAT) IV Stopped (11/06/19 2240)  . levETIRAcetam Stopped (11/06/19 2357)  . milrinone 0.125 mcg/kg/min (11/07/19 0948)  . prismasol BGK 4/2.5 1,500 mL/hr at 11/07/19 0631  . propofol (DIPRIVAN) infusion  Stopped (10/29/19 0415)   PRN Meds:.sodium chloride, artificial tears, dextrose, dextrose, fentaNYL, heparin, heparin, LORazepam, metoprolol tartrate, midazolam, ondansetron (ZOFRAN) IV, sodium chloride flush  ABG    Component Value Date/Time   PHART 7.420 11/06/2019 0616   PCO2ART 44.2 11/06/2019 0616   PO2ART 182 (H) 11/06/2019 0616   HCO3 28.8 (H) 11/06/2019 0616   TCO2 30 11/06/2019 0616   ACIDBASEDEF 1.0 10/28/2019 1841   O2SAT 56.1 11/07/2019 0428    A/P  1. 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 Shock, cardiogenic and hemorrhagic, on inotrope; significant RV failure 3. VDRF. per CCM / AHF / TCTS 4. Status post minimally invasive tricuspid valve repair 10/29/2019 5. ABLA  6. Convulsive status epilepticus, neurology following; anoxic encephalopathy 7. SLE 8. ITP, on IVIG  Cont CRRT current settings.  Keep even UF; palliative working with family; replete phos today  Rexene Agent , MD [employer]

## 2019-11-07 NOTE — Progress Notes (Signed)
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  Post op course complicated by hemorrhagic effusion in R thorax, Vfib arrest, AKI requiring CVVHD, severe cardiogenic shock requiring ECMO support and anoxic brain injury.   Past Medical History  Avascular necrosis DM2 Hypertension Hypothyroidism SLE Pulmonary hypertension TTP  Consults:  PCCM, neurology, advanced heart failure, palliative care  Procedures:  4/7 tricuspid valve repair through median sternotomy 4/8 reexploration of the mediastinum with cannulation for ECMO 4/12 decannulation from [facility] ECMO  4/14 HD cath right IJ 4/19 chest tube and foley removed  Significant Diagnostic Tests:  Transesophageal echocardiogram January 2021  1. Left ventricular ejection fraction, by visual estimation, is 60 to  65%. The left ventricle has normal function. There is no left ventricular  hypertrophy.  2. The left ventricle has no regional wall motion abnormalities.  3. Global right ventricle has mildly reduced systolic function.The right  ventricular size is normal. No increase in right ventricular wall  thickness.  4. Left atrial size was moderately dilated.  5. Right atrial size was severely dilated.  6. The mitral valve is normal in structure. Trivial mitral valve  regurgitation.  7. The tricuspid valve is abnormal.  8. The tricuspid valve is abnormal. Tricuspid valve regurgitation is  severe.  9. Apparent mild restriction of septal leaflet. RVSP ~ 59mmHG.  10. The aortic valve is normal in structure. Aortic valve regurgitation is  not visualized.  11. The pulmonic valve was grossly normal. Pulmonic valve regurgitation is  not visualized.  12. Mild plaque invoving the  descending aorta.  13. Severely elevated pulmonary artery systolic pressure.   Right heart catheterization June 2020 Findings:  RA = 5 RV = 34/7 PA = 34/6 (20) PCW = 8 Fick cardiac output/index = 5.9/3.1 PVR = 2.0 WU Ao sat = 100% PA sat = 71%, 72% SVC sat = 66%  Assessment: 1. Minimally elevated PA pressures with normal PVR and cardiac output 2. No evidence of significant intracardiac shunt  CT Head 4/15: Evaluation limited by streak and beam hardening artifact arising from scalp monitoring leads.  Apparent asymmetric hypodensity within the right cerebellar hemisphere may reflect asymmetric beam hardening artifact. Edema related to infarct or hypoxic/ischemic injury cannot be excluded. Consider brain MRI for further evaluation, as clinically warranted.  Paranasal sinus mucosal thickening.  Large bilateral mastoid effusions. Associated left middle ear effusion.  Micro Data:  4/9 wound culture >  4/16 trach aspirate > c albicans 4/17 trach aspirate> c albicans  Antimicrobials:  Levofloxacin 4/7 >> 4/8 Vancomycin 4/7 >> 4/12 Meropenem 4/9 >> 4/13  Interim history/subjective:  No acute events  Objective   Blood pressure 114/63, pulse (!) 101, temperature 98.2 F (36.8 C), resp. rate (!) 25, height 5\' 7"  (1.702 m), weight 66.5 kg, last menstrual period 04/02/2017, SpO2 97 %. CVP:  [3 mmHg-12 mmHg] 5 mmHg  Vent Mode: PRVC FiO2 (%):  [50 %] 50 % Set Rate:  [26 bmp] 26 bmp Vt Set:  [400 mL] 400 mL PEEP:  [5 cmH20] 5 cmH20 Plateau Pressure:  [17 cmH20-20 cmH20] 20 cmH20   Intake/Output Summary (Last 24 hours) at 11/07/2019 1109 Last data filed at 11/07/2019 0800 Gross per 24 hour  Intake 2274.78 ml  Output 2474 ml  Net -199.22 ml   Filed Weights   11/05/19 0500 11/06/19 0500 11/07/19 0428  Weight: 66.4 kg 69.5 kg 66.5 kg    Examination: General:  Chronically ill appearing In bed on vent HENT: NCAT ETT in place PULM: CTA B, vent supported  breathing CV: RRR, no mgr GI: BS+, soft, nontender MSK: normal bulk and tone Neuro:flicker response to touch R arm, otherwise no response to my exam    Resolved Hospital Problem list     Assessment & Plan:  Acute hypoxemic respiratory failure: As she remains unresponsive it is unreasonable to consider extubation except in the context of withdrawal of care and comfort measures Continue full mechanical ventilatory support Ventilator associated pneumonia prevention protocol   Cardiogenic shock S/p tricupid valve repair Atrial flutter Milrinone and amiodarone per heart failure service Volume removal per CVVHD  SLE with lupus nephritis, 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  Palliative care guiding family through process towards withdrawal of care.  Continue serial exams, antiepileptics as per neurology.  WHO group 1 PAH Maintain off of pulmonary vasodilators other than oxygen  Best practice:  Diet: Tube feeding Pain/Anxiety/Delirium protocol (if indicated): holding VAP protocol (if indicated): yes DVT prophylaxis: SCD GI prophylaxis: PPI Glucose control: monitor Mobility: bed rest Code Status: DNR Family Communication: per palliative/primary Disposition:   Labs   CBC: Recent Labs  Lab 11/02/19 0500 11/02/19 0512 11/03/19 0355 11/03/19 0427 11/04/19 0433 11/04/19 0436 11/05/19 0404 11/05/19 0543 11/05/19 1905 11/06/19 0616 11/06/19 0746  WBC 16.3*  --  11.4*  --  10.0  --  10.3  --   --   --  9.3  NEUTROABS  --   --   --   --   --   --  8.4*  --   --   --  7.7  HGB 10.3*   < > 9.0*   < > 9.1*   < > 9.5* 11.2* 9.9* 9.9* 8.2*   HCT 30.3*   < > 26.7*   < > 25.8*   < > 27.4* 33.0* 29.0* 29.0* 24.1*  MCV 87.1  --  86.1  --  82.4  --  83.5  --   --   --  84.0  PLT 44*  --  50*  --  78*  --  100*  --   --   --  88*   < > = values in this interval not displayed.    Basic Metabolic Panel: Recent Labs  Lab 11/04/19 0433 11/04/19 0436 11/05/19 0404 11/05/19 0543 11/05/19 1600 11/05/19 1905 11/06/19 0430 11/06/19 0616 11/06/19 0746 11/06/19 1620 11/07/19 0433 11/07/19 0434  NA 137   < > 135   < > 135   < > 134* 135 133* 132*  --  130*  K 4.5   < > 4.5   < > 4.4   < > 4.3 4.6 4.4 4.3  --  4.1  CL 104   < > 101   < > 100  --  99  --  100 100  --  97*  CO2 26   < > 26   < > 24  --  24  --  25 24  --  26  GLUCOSE 98   < > 75   < > 129*  --  102*  --  120* 115*  --  371*  BUN 38*   < > 41*   < > 51*  --  45*  --  42* 41*  --  36*  CREATININE 1.73*   < > 1.74*   < > 2.28*  --  2.04*  --  2.01* 1.75*  --  1.59*  CALCIUM 8.5*   < > 8.6*   < > 8.2*  --  8.6*  --  8.2* 8.3*  --  8.1*  MG 2.6*  --  2.8*  --   --   --  2.6*  --  2.6*  --  2.5*  --   PHOS 2.2*   < > 3.5  --  4.1  --  3.2  --   --  2.6  --  2.2*   < > = values in this interval not displayed.   GFR: Estimated Creatinine Clearance: 43 mL/min (A) (by C-G formula based on SCr of 1.59 mg/dL (H)). Recent Labs  Lab 11/03/19 0355 11/04/19 0433 11/05/19 0404 11/06/19 0746  WBC 11.4* 10.0 10.3 9.3    Liver Function Tests: Recent Labs  Lab 11/03/19 0355 11/03/19 1629 11/04/19 0433 11/04/19 1555 11/05/19 0404 11/05/19 0404 11/05/19 1600 11/06/19 0430 11/06/19 1620 11/07/19 0433 11/07/19 0434  AST 47*  --  49*  --  55*  --   --  53*  --  38  --   ALT 22  --  23  --  24  --   --  27  --  23  --   ALKPHOS 165*  --  169*  --  193*  --   --  187*  --  177*  --   BILITOT 1.1  --  1.3*  --  1.5*  --   --  1.8*  --  1.5*  --   PROT 7.5  --  7.5  --  7.5  --   --  7.3  --  6.7  --   ALBUMIN 2.3*  2.3*   < > 2.3*  2.3*   < > 2.5*  2.5*   < > 2.3* 2.5*   2.5* 2.5* 2.4* 2.4*   < > = values in this interval not displayed.   No results for input(s): LIPASE, AMYLASE in the last 168 hours. No results for input(s): AMMONIA in the last 168 hours.  ABG    Component Value Date/Time   PHART 7.420 11/06/2019 0616   PCO2ART 44.2 11/06/2019 0616   PO2ART 182 (H) 11/06/2019 0616   HCO3 28.8 (H) 11/06/2019 0616   TCO2 30 11/06/2019 0616   ACIDBASEDEF 1.0 10/28/2019 1841   O2SAT 56.1 11/07/2019 0428     Coagulation Profile: Recent Labs  Lab 11/03/19 0355 11/04/19 0433 11/05/19 0404 11/06/19 0430 11/07/19 0433  INR 1.2 1.3* 1.2 1.2 1.2    Cardiac Enzymes: No results for input(s): CKTOTAL, CKMB, CKMBINDEX, TROPONINI in the last 168 hours.  HbA1C: Hgb A1c MFr Bld  Date/Time Value Ref Range Status  10/19/2019 12:30 PM 7.3 (H) 4.8 - 5.6 % Final    Comment:    (NOTE) Pre diabetes:          5.7%-6.4% Diabetes:              >6.4% Glycemic control for   <7.0% adults with diabetes     CBG: Recent  Labs  Lab 11/06/19 1622 11/06/19 1951 11/07/19 0115 11/07/19 0359 11/07/19 0754  GLUCAP 105* 114* 133* 114* 84     Critical care time: 35 minutes    Roselie Awkward, MD [place] [facility] Pager: [phone] Cell: [phone] If no response, call [phone]

## 2019-11-07 NOTE — Progress Notes (Signed)
Patient ID: [name], female   DOB: 1970/07/21, 49 y.o.   MRN: [id number] P    Advanced Heart Failure Rounding Note  PCP-Cardiologist: No primary care provider on file.   Subjective:    Remains unresponsive. Off sedation since 4/15.   Having increased WOB on vent.  Remains on CVVHD (net even). IV milrinone and amiodarone  Objective:   Weight Range: 66.5 kg Body mass index is 22.96 kg/m.   Vital Signs:   Temp:  [96.8 F (36 C)-98.2 F (36.8 C)] 98.2 F (36.8 C) (04/24 0757) Pulse Rate:  [101-115] 101 (04/24 0312) Resp:  [20-46] 25 (04/24 0800) BP: (104-114)/(55-63) 114/63 (04/24 0312) SpO2:  [97 %-100 %] 97 % (04/24 0800) Arterial Line BP: (97-116)/(53-68) 97/61 (04/24 0800) FiO2 (%):  [50 %] 50 % (04/24 0800) Weight:  [66.5 kg] 66.5 kg (04/24 0428) Last BM Date: 11/06/19  Weight change: Filed Weights   11/05/19 0500 11/06/19 0500 11/07/19 0428  Weight: 66.4 kg 69.5 kg 66.5 kg    Intake/Output:   Intake/Output Summary (Last 24 hours) at 11/07/2019 1100 Last data filed at 11/07/2019 0800 Gross per 24 hour  Intake 2274.78 ml  Output 2474 ml  Net -199.22 ml      Physical Exam    General:  Critically ill appearing. Unresponsive. Increased WOB on vent HEENT: normal + ETT Neck: supple. JVP up Carotids 2+ bilat; no bruits. No lymphadenopathy or thryomegaly appreciated. Cor: PMI nondisplaced. Tachy mildly irregular Incisions crusted over Lungs: tachypneic coarse Abdomen: soft, nontender, nondistended. No hepatosplenomegaly. No bruits or masses. Good bowel sounds. Extremities: no cyanosis, clubbing, rash, edema Neuro: Unresponsive to any stimuli   Telemetry   AFL  Rate 110-120 Personally reviewed  Labs    CBC Recent Labs    11/05/19 0404 11/05/19 0543 11/06/19 0616 11/06/19 0746  WBC 10.3  --   --  9.3  NEUTROABS 8.4*  --   --  7.7  HGB 9.5*   < > 9.9* 8.2*  HCT 27.4*   < > 29.0* 24.1*  MCV 83.5  --   --  84.0  PLT 100*  --   --  88*   < > =  values in this interval not displayed.   Basic Metabolic Panel Recent Labs    11/06/19 0746 11/06/19 0746 11/06/19 1620 11/07/19 0433 11/07/19 0434  NA 133*   < > 132*  --  130*  K 4.4   < > 4.3  --  4.1  CL 100   < > 100  --  97*  CO2 25   < > 24  --  26  GLUCOSE 120*   < > 115*  --  371*  BUN 42*   < > 41*  --  36*  CREATININE 2.01*   < > 1.75*  --  1.59*  CALCIUM 8.2*   < > 8.3*  --  8.1*  MG 2.6*  --   --  2.5*  --   PHOS  --    < > 2.6  --  2.2*   < > = values in this interval not displayed.   Liver Function Tests Recent Labs    11/06/19 0430 11/06/19 1620 11/07/19 0433 11/07/19 0434  AST 53*  --  38  --   ALT 27  --  23  --   ALKPHOS 187*  --  177*  --   BILITOT 1.8*  --  1.5*  --   PROT 7.3  --  6.7  --   ALBUMIN 2.5*  2.5*   < > 2.4* 2.4*   < > = values in this interval not displayed.   No results for input(s): LIPASE, AMYLASE in the last 72 hours. Cardiac Enzymes No results for input(s): CKTOTAL, CKMB, CKMBINDEX, TROPONINI in the last 72 hours.  BNP: BNP (last 3 results) Recent Labs    04/15/19 1101  BNP 248.7*    ProBNP (last 3 results) No results for input(s): PROBNP in the last 8760 hours.   D-Dimer No results for input(s): DDIMER in the last 72 hours. Hemoglobin A1C No results for input(s): HGBA1C in the last 72 hours. Fasting Lipid Panel No results for input(s): CHOL, HDL, LDLCALC, TRIG, CHOLHDL, LDLDIRECT in the last 72 hours. Thyroid Function Tests No results for input(s): TSH, T4TOTAL, T3FREE, THYROIDAB in the last 72 hours.  Invalid input(s): FREET3  Other results:   Imaging    No results found.   Medications:     Scheduled Medications: . bisacodyl  10 mg Oral Daily   Or  . bisacodyl  10 mg Rectal Daily  . chlorhexidine gluconate (MEDLINE KIT)  15 mL Mouth Rinse BID  . Chlorhexidine Gluconate Cloth  6 each Topical Daily  . feeding supplement (PIVOT 1.5 CAL)  1,000 mL Per Tube Q24H  . feeding supplement (PRO-STAT  SUGAR FREE 64)  30 mL Per Tube QID  . levothyroxine  50 mcg Per Tube Q0600  . mouth rinse  15 mL Mouth Rinse 10 times per day  . pantoprazole (PROTONIX) IV  40 mg Intravenous QHS  . PHENObarbital  160 mg Intravenous BID  . sodium chloride flush  10-40 mL Intracatheter Q12H    Infusions: .  prismasol BGK 4/2.5 500 mL/hr at 11/07/19 0631  .  prismasol BGK 4/2.5 300 mL/hr at 11/06/19 2217  . sodium chloride Stopped (10/27/19 1308)  . amiodarone 30 mg/hr (11/07/19 0800)  . amiodarone    . dextrose 50 mL/hr at 11/07/19 0800  . fentaNYL infusion INTRAVENOUS 9.35 mcg/hr (11/07/19 0800)  . heparin 10,000 units/ 20 mL infusion syringe 500 Units/hr (11/06/19 2205)  . lacosamide (VIMPAT) IV Stopped (11/06/19 2240)  . levETIRAcetam Stopped (11/06/19 2357)  . milrinone 0.125 mcg/kg/min (11/07/19 0948)  . prismasol BGK 4/2.5 1,500 mL/hr at 11/07/19 0631  . propofol (DIPRIVAN) infusion Stopped (10/29/19 0415)  . sodium phosphate  Dextrose 5% IVPB      PRN Medications: sodium chloride, artificial tears, dextrose, dextrose, fentaNYL, heparin, heparin, LORazepam, metoprolol tartrate, midazolam, ondansetron (ZOFRAN) IV, sodium chloride flush     Assessment/Plan   1. Post-cardiotomy hemorrhagic shock - Patient with refractory post-operative chest bleeding s/p minimally invasive TV repair on 11/02/2019 in setting of longstanding SLE - has received  RBCS, PLTs, TXA, cryo, Factor 7, steroids - ECMO started in OR - Repeat OR visit for right chest clot removal on 4/9 with improved hemodynamics, TEE with EF 40-45%, mild-moderately decreased RV function, stable replaced TV.  - ECMO decannulated 4/12.   2. Acute systolic CHF, primarily RV failure with cardiogenic shock - Volume status maintained on CVVHD. On milrinone  3. 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   4. Post-operative respiratory failure - remains intubated, FiO2 0.5.  Unable to wean due to Neuro  status  5. Convulsive status epilepticus - Noted on 4/10 EEG.  - She is now on Vimpat and Keppra.  - With ongoing seizures, she is now off Propofol and Versed, started phenobarbital  IV.  No further seizure activity.  - Head CT 4/15 with significant artifact but may show some anoxic injury.  Unable to get MRI due to retained wire.  - No awakening yet off sedation, now off since 4/15 hrs off sedation. Neurology following.  - Seen by Neurology this week with only minimal response to pain. Neuro prognostication remains poor. Family meeting this week and made DNR. However today is patient's daughter's birthday. They want to hold off on transition to comfort care until at least tomorrow. Pallaitvie Care have reached out to Pediatric psychiatrist who is now involved. Appreciate their efforts greatly.  Will continue current level of care today. Possible switch to comfort care tomorrow due to devastating neurologic injury.   6. AKI  - CVVH ongoing, - keeping even   7. Atrial fibrillation/flutter - She remains in atrial flutter at rates around 110-120.  Fortunately hemodynamics are stable. - Tolerating low-dose heparin    - Continue IV amio for now  8. Goals of care -> DNR - Palliative care is following. - She appears to have severe anoxic brain injury with very poor prognosis.   She is actively dying. Palliative Care team very involved and patient DNR but family has been hesitant to make full switch to [place]. Patient now appears to be suffering. Will need to continue to work with family to realize that she has no chance of meaningful recovery and need to focus on ensuring her comfort.    CRITICAL CARE Performed by: Glori Bickers  Total critical care time: 35 minutes  Critical care time was exclusive of separately billable procedures and treating other patients.  Critical care was necessary to treat or prevent imminent or life-threatening deterioration.  Critical care was time  spent personally by me (independent of midlevel providers or residents) on the following activities: development of treatment plan with patient and/or surrogate as well as nursing, discussions with consultants, evaluation of patient's response to treatment, examination of patient, obtaining history from patient or surrogate, ordering and performing treatments and interventions, ordering and review of laboratory studies, ordering and review of radiographic studies, pulse oximetry and re-evaluation of patient's condition.    Length of Stay: [place], MD  11/07/2019, 11:00 AM  Advanced Heart Failure Team Pager [phone] (M-F; 7a - 4p)  Please contact [facility] for night-coverage after hours (4p -7a ) and weekends on [email or web address]

## 2019-11-08 DIAGNOSIS — R092 Respiratory arrest: Secondary | ICD-10-CM | POA: Diagnosis not present

## 2019-11-08 DIAGNOSIS — N179 Acute kidney failure, unspecified: Secondary | ICD-10-CM

## 2019-11-08 DIAGNOSIS — I50813 Acute on chronic right heart failure: Secondary | ICD-10-CM | POA: Diagnosis not present

## 2019-11-08 DIAGNOSIS — G40901 Epilepsy, unspecified, not intractable, with status epilepticus: Secondary | ICD-10-CM | POA: Diagnosis not present

## 2019-11-08 DIAGNOSIS — Z7189 Other specified counseling: Secondary | ICD-10-CM

## 2019-11-08 DIAGNOSIS — Z9911 Dependence on respirator [ventilator] status: Secondary | ICD-10-CM

## 2019-11-08 LAB — COOXEMETRY PANEL
Carboxyhemoglobin: 1.5 % (ref 0.5–1.5)
Methemoglobin: 1.6 % — ABNORMAL HIGH (ref 0.0–1.5)
O2 Saturation: 44.6 %
Total hemoglobin: 8.6 g/dL — ABNORMAL LOW (ref 12.0–16.0)

## 2019-11-08 LAB — GLUCOSE, CAPILLARY
Glucose-Capillary: 95 mg/dL (ref 70–99)
Glucose-Capillary: 113 mg/dL — ABNORMAL HIGH (ref 70–99)
Glucose-Capillary: 102 mg/dL — ABNORMAL HIGH (ref 70–99)

## 2019-11-08 LAB — RENAL FUNCTION PANEL
Albumin: 2.4 g/dL — ABNORMAL LOW (ref 3.5–5.0)
Anion gap: 7 (ref 5–15)
BUN: 29 mg/dL — ABNORMAL HIGH (ref 6–20)
CO2: 26 mmol/L (ref 22–32)
Calcium: 7.9 mg/dL — ABNORMAL LOW (ref 8.9–10.3)
Chloride: 96 mmol/L — ABNORMAL LOW (ref 98–111)
Creatinine, Ser: 1.55 mg/dL — ABNORMAL HIGH (ref 0.44–1.00)
GFR calc Af Amer: 46 mL/min — ABNORMAL LOW (ref >60)
GFR calc non Af Amer: 40 mL/min — ABNORMAL LOW (ref >60)
Glucose, Bld: 432 mg/dL — ABNORMAL HIGH (ref 70–99)
Phosphorus: 2.5 mg/dL (ref 2.5–4.6)
Potassium: 4 mmol/L (ref 3.5–5.1)
Sodium: 129 mmol/L — ABNORMAL LOW (ref 135–145)

## 2019-11-08 LAB — APTT: aPTT: 52 seconds — ABNORMAL HIGH (ref 24–36)

## 2019-11-08 LAB — MAGNESIUM: Magnesium: 2.4 mg/dL (ref 1.7–2.4)

## 2019-11-08 MED ORDER — HALOPERIDOL LACTATE 2 MG/ML PO CONC
0.5000 mg | ORAL | Status: DC | PRN
  Filled 2019-11-08: qty 0.3

## 2019-11-08 MED ORDER — LORAZEPAM 2 MG/ML IJ SOLN: 0.5000 mg | INTRAMUSCULAR | Status: DC | PRN

## 2019-11-08 MED ORDER — HALOPERIDOL 0.5 MG PO TABS
0.5000 mg | ORAL_TABLET | ORAL | Status: DC | PRN
  Filled 2019-11-08: qty 1

## 2019-11-08 MED ORDER — BIOTENE DRY MOUTH MT LIQD: 15.0000 mL | OROMUCOSAL | Status: DC | PRN

## 2019-11-08 MED ORDER — HALOPERIDOL LACTATE 5 MG/ML IJ SOLN: 0.5000 mg | INTRAMUSCULAR | Status: DC | PRN

## 2019-11-08 MED ORDER — GLYCOPYRROLATE 0.2 MG/ML IJ SOLN
0.2000 mg | INTRAMUSCULAR | Status: DC | PRN
  Administered 2019-11-10: 13:00:00 0.2 mg via INTRAVENOUS
  Filled 2019-11-08: qty 1

## 2019-11-08 MED ORDER — ONDANSETRON HCL 4 MG/2ML IJ SOLN: 4.0000 mg | Freq: Four times a day (QID) | INTRAMUSCULAR | Status: DC | PRN

## 2019-11-08 MED ORDER — GLYCOPYRROLATE 1 MG PO TABS
1.0000 mg | ORAL_TABLET | ORAL | Status: DC | PRN
  Filled 2019-11-08: qty 1

## 2019-11-08 MED ORDER — GLYCOPYRROLATE 0.2 MG/ML IJ SOLN: 0.2000 mg | INTRAMUSCULAR | Status: DC | PRN

## 2019-11-08 MED ORDER — ONDANSETRON 4 MG PO TBDP
4.0000 mg | ORAL_TABLET | Freq: Four times a day (QID) | ORAL | Status: DC | PRN
  Filled 2019-11-08: qty 1

## 2019-11-08 NOTE — Progress Notes (Signed)
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  Post op course complicated by hemorrhagic effusion in R thorax, Vfib arrest, AKI requiring CVVHD, severe cardiogenic shock requiring ECMO support and anoxic brain injury.  Past Medical History  Avascular necrosis DM2 Hypertension Hypothyroidism SLE Pulmonary hypertension ITP  Consults:  PCCM, neurology, advanced heart failure, palliative care  Procedures:  4/7 tricuspid valve repair through median sternotomy 4/8 reexploration of the mediastinum with cannulation for ECMO 4/12 decannulation from [facility] ECMO  4/14 HD cath right IJ 4/19 chest tube and foley removed  Significant Diagnostic Tests:  Transesophageal echocardiogram January 2021  1. Left ventricular ejection fraction, by visual estimation, is 60 to  65%. The left ventricle has normal function. There is no left ventricular  hypertrophy.  2. The left ventricle has no regional wall motion abnormalities.  3. Global right ventricle has mildly reduced systolic function.The right  ventricular size is normal. No increase in right ventricular wall  thickness.  4. Left atrial size was moderately dilated.  5. Right atrial size was severely dilated.  6. The mitral valve is normal in structure. Trivial mitral valve  regurgitation.  7. The tricuspid valve is abnormal.  8. The tricuspid valve is abnormal. Tricuspid valve regurgitation is  severe.  9. Apparent mild restriction of septal leaflet. RVSP ~ 4mmHG.  10. The aortic valve is normal in structure. Aortic valve regurgitation is  not visualized.  11. The pulmonic valve was grossly normal. Pulmonic valve regurgitation is  not visualized.  12. Mild plaque invoving the  descending aorta.  13. Severely elevated pulmonary artery systolic pressure.   Right heart catheterization June 2020 Findings:  RA = 5 RV = 34/7 PA = 34/6 (20) PCW = 8 Fick cardiac output/index = 5.9/3.1 PVR = 2.0 WU Ao sat = 100% PA sat = 71%, 72% SVC sat = 66%  Assessment: 1. Minimally elevated PA pressures with normal PVR and cardiac output 2. No evidence of significant intracardiac shunt  CT Head 4/15: Evaluation limited by streak and beam hardening artifact arising from scalp monitoring leads.  Apparent asymmetric hypodensity within the right cerebellar hemisphere may reflect asymmetric beam hardening artifact. Edema related to infarct or hypoxic/ischemic injury cannot be excluded. Consider brain MRI for further evaluation, as clinically warranted.  Paranasal sinus mucosal thickening.  Large bilateral mastoid effusions. Associated left middle ear effusion.  Micro Data:  4/9 wound culture >  4/16 trach aspirate > c albicans 4/17 trach aspirate> c albicans  Antimicrobials:  Levofloxacin 4/7 >> 4/8 Vancomycin 4/7 >> 4/12 Meropenem 4/9 >> 4/13  Interim history/subjective:  No acute events overnight.   Objective   Blood pressure 103/60, pulse (!) 110, temperature 97.7 F (36.5 C), temperature source Oral, resp. rate 17, height 5\' 7"  (1.702 m), weight 66.9 kg, last menstrual period 04/02/2017, SpO2 100 %. CVP:  [6 mmHg-21 mmHg] 8 mmHg  Vent Mode: PRVC FiO2 (%):  [50 %] 50 % Set Rate:  [26 bmp] 26 bmp Vt Set:  [400 mL] 400 mL PEEP:  [5 cmH20] 5 cmH20 Plateau Pressure:  [20 cmH20-28 cmH20] 23 cmH20   Intake/Output Summary (Last 24 hours) at 11/08/2019 6301 Last data filed at 11/08/2019 0500 Gross per 24 hour  Intake 2700.11 ml  Output 2290 ml  Net 410.11 ml   Filed Weights   11/06/19 0500 11/07/19 0428 11/08/19 0429  Weight: 69.5 kg 66.5 kg 66.9 kg    Examination: General: critically-ill, intubated, unresponsive while low dose fentanyl gtt  HENT: ETT tube in place  PULM:coarse breath sounds diminished on the R CV: tachycardic, no mrg  GI: soft, NTND, hypoactive bowel sounds  Neuro: opens eyes to external stimulus but does not track or follows commands, does not withdraw to pain    Resolved Hospital Problem list     Assessment & Plan:  Acute hypoxemic respiratory failure: As she remains unresponsive it is unreasonable to consider extubation except in the context of withdrawal of care and comfort measures Continue full mechanical ventilatory support  Ventilator associated pneumonia prevention protocol Family meeting today at [place] to discuss when to transition to comfort care   Cardiogenic shock S/p tricupid valve repair Atrial flutter Milrinone and amiodarone per heart failure service Volume removal per CVVHD  SLE with lupus nephritis, 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  Palliative care guiding family through process towards withdrawal of care.  Continue serial exams, antiepileptics as per neurology.  WHO group 1 PAH Maintain off of pulmonary vasodilators other than oxygen  Best practice:  Diet: Tube feeding Pain/Anxiety/Delirium protocol (if indicated): holding VAP protocol (if indicated): yes DVT prophylaxis: SCD GI prophylaxis: PPI Glucose control: monitor Mobility: bed rest Code Status: DNR Family Communication: per palliative/primary Disposition:   Labs   CBC: Recent Labs  Lab 11/02/19 0500 11/02/19 0512 11/03/19 0355 11/03/19 0427 11/04/19 0433 11/04/19 0436 11/05/19 0404 11/05/19 0543 11/05/19 1905 11/06/19 0616 11/06/19 0746  WBC 16.3*  --   11.4*  --  10.0  --  10.3  --   --   --  9.3  NEUTROABS  --   --   --   --   --   --  8.4*  --   --   --  7.7  HGB 10.3*   < > 9.0*   < > 9.1*   < > 9.5* 11.2* 9.9* 9.9* 8.2*  HCT 30.3*   < > 26.7*   < > 25.8*   < > 27.4* 33.0* 29.0* 29.0* 24.1*  MCV 87.1  --  86.1  --  82.4  --  83.5  --   --   --  84.0  PLT 44*  --  50*  --  78*  --  100*  --   --   --  88*   < > = values in this interval not displayed.    Basic Metabolic Panel: Recent Labs  Lab 11/05/19 0404 11/05/19 0543 11/06/19 0430 11/06/19 6629 11/06/19 0746 11/06/19 1620 11/07/19 0433 11/07/19 0434 11/07/19 1721 11/08/19 0442  NA 135   < > 134*   < > 133* 132*  --  130* 133* 129*  K 4.5   < > 4.3   < > 4.4 4.3  --  4.1 4.3 4.0  CL 101   < > 99   < > 100 100  --  97* 99 96*  CO2 26   < > 24   < > 25 24  --  26 25 26   GLUCOSE 75   < > 102*   < > 120* 115*  --  371* 123* 432*  BUN 41*   < > 45*   < > 42* 41*  --  36* 37* 29*  CREATININE 1.74*   < > 2.04*   < > 2.01* 1.75*  --  1.59* 1.79* 1.55*  CALCIUM 8.6*   < > 8.6*   < > 8.2* 8.3*  --  8.1* 8.3* 7.9*  MG 2.8*  --  2.6*  --  2.6*  --  2.5*  --   --  2.4  PHOS 3.5   < > 3.2  --   --  2.6  --  2.2* 4.8* 2.5   < > = values in this interval not displayed.   GFR: Estimated Creatinine Clearance: 44.1 mL/min (A) (by C-G formula based on SCr of 1.55 mg/dL (H)). Recent Labs  Lab 11/03/19 0355 11/04/19 0433 11/05/19 0404 11/06/19 0746  WBC 11.4* 10.0 10.3 9.3    Liver Function Tests: Recent Labs  Lab 11/03/19 0355 11/03/19 1629 11/04/19 0433 11/04/19 1555 11/05/19 0404 11/05/19 1600 11/06/19 0430 11/06/19 0430 11/06/19 1620 11/07/19 0433 11/07/19 0434 11/07/19 1721 11/08/19 0442  AST 47*  --  49*  --  55*  --  53*  --   --  38  --   --   --   ALT 22  --  23  --  24  --  27  --   --  23  --   --   --   ALKPHOS 165*  --  169*  --  193*  --  187*  --   --  177*  --   --   --   BILITOT 1.1  --  1.3*  --  1.5*  --  1.8*  --   --  1.5*  --   --   --   PROT  7.5  --  7.5  --  7.5  --  7.3  --   --  6.7  --   --   --   ALBUMIN 2.3*  2.3*   < > 2.3*  2.3*   < > 2.5*  2.5*   < > 2.5*  2.5*   < > 2.5* 2.4* 2.4* 2.3* 2.4*   < > = values in this interval not displayed.   No results for input(s): LIPASE, AMYLASE in the last 168 hours. No results for input(s): AMMONIA in the last 168 hours.  ABG    Component Value Date/Time   PHART 7.420 11/06/2019 0616   PCO2ART 44.2 11/06/2019 0616   PO2ART 182 (H) 11/06/2019 0616   HCO3 28.8 (H) 11/06/2019 0616   TCO2 30 11/06/2019 0616   ACIDBASEDEF 1.0 10/28/2019 1841   O2SAT 44.6 11/08/2019 0440     Coagulation Profile: Recent Labs  Lab 11/03/19 0355 11/04/19 0433 11/05/19 0404 11/06/19 0430 11/07/19 0433  INR 1.2 1.3* 1.2 1.2 1.2    Cardiac Enzymes: No results for input(s): CKTOTAL, CKMB, CKMBINDEX, TROPONINI in the last 168 hours.  HbA1C: Hgb A1c MFr Bld  Date/Time Value Ref Range Status  10/19/2019 12:30 PM 7.3 (H) 4.8 - 5.6 % Final    Comment:    (NOTE) Pre diabetes:          5.7%-6.4% Diabetes:              >6.4% Glycemic control for   <7.0% adults with diabetes  CBG: Recent Labs  Lab 11/07/19 1147 11/07/19 1559 11/07/19 2021 11/07/19 2330 11/08/19 0457  GLUCAP 109* 104* 119* 102* 95

## 2019-11-08 NOTE — Progress Notes (Addendum)
Daily Progress Note   Patient Name: [name]       Date: 11/08/2019 DOB: 1970-09-13  Age: 49 y.o. MRN#: [id number] Attending Physician: Rexene Alberts, MD Primary Care Physician: [name], MD Admit Date: 11/04/2019  Reason for Consultation/Follow-up: To discuss complex medical decision making related to patient's goals of care.  Visited patient at bedside.  Discussed at length with ICU RN and Dr. Carlis Abbott of CCM.  Discussed with CTS rounding physician.  Subjective: Family meeting held with Dr. Carlis Abbott, patient's mother, father, brother, and Nestor Ramp.  We prayed together.  Dr. Carlis Abbott reviewed the hospitalization at a high level and explained that Lashunta has been off of sedation for an extended period and we are not seeing neurological recovery.    The family expressed that they were very concerned about her comfort level as she appeared in distress yesterday.  We talked some more about her body's inability to control her blood glucose, the balancing act we are trying to maintain between her blood pressure and her comfort, neurological changes on exam that indicate further deterioration.    The conversation shift to how to improve her comfort level - stopping finger sticks, stopping CRRT to allow more room in her BP for comfort medications, stopping painful exams and blood pressure checks.    The family agreed that they do not want to escalate care.  If she spikes a temperature they do not want to add antibiotics at this point.  Herbie Baltimore expressed a desire to keep her here until May - as he needs his daughter's birthday not to become an annual reminder of her mother's death.    We agreed to leave the vent in place until May 1st.  The family understands she may die prior to that time,  however, if she is still with [place] we will move forward with a compassionate wean from the vent on 5/1.  Assessment: Patient showing signs of continued brain deterioration - labored breathing unable to reduce vent settings, posturing, hypoglycemia.  More comfortable on fentanyl gtt.   Patient Profile/HPI:  49 y.o. female with past medical history of lupus, lupus nephritis, immunosuppressed on plaquenil and cyclophosphamide, CHF (EF 40%), ITP (per husband almost died of bleeding in 13-Oct-2005), and AVN of bilateral hips and shoulders, who was admitted on 11/05/2019 for elective  tricuspid valve repair.   In the OR she developed severe bleeding and hemorrhagic shock.  She subsequently was placed on ECMO.  She made another trip back to the OR for evacuation of hemothorax.  She subsequently developed status epilepticus.  After treatment with dual antiseizure medications and sedation the status stopped and she has been having intermittent seizing episodes since.  There is some concern for a degree of anoxic brain injury.  She is currently slated for ECMO decannulation on 4/12 am.   Length of Stay: 18   Vital Signs: BP 103/60   Pulse (!) 110   Temp 97.6 F (36.4 C)   Resp 17   Ht 5\' 7"  (1.702 m)   Wt 66.9 kg   LMP 04/02/2017   SpO2 100%   BMI 23.10 kg/m  SpO2: SpO2: 100 % O2 Device: O2 Device: Ventilator O2 Flow Rate: O2 Flow Rate (L/min): 60 L/min       Palliative Assessment/Data: 10%     Palliative Care Plan    Recommendations/Plan: 1.  Comfort is now the priority.  Vent stays in place until 5/1, but the focus is on her comfort. 2.  Family understands she may die prior to 5/1 but would prefer she stay alive until May if possible. 3.  Will DC interventions not focused on comfort including CBG checks and CRRT. 4.  Will add PRN medications to be used for comfort. 5.  Continue milrinone and  Amiodarone for now as comfort measures  Code Status:  DNR  Prognosis:   Hours - Days    Discharge Planning:  Anticipated Hospital Death  Care plan was discussed with Family, ICU RN, CCM MD  Thank you for allowing the Palliative Medicine Team to assist in the care of this patient.  Total time spent:  75 min.     Greater than 50%  of this time was spent counseling and coordinating care related to the above assessment and plan.  Florentina Jenny, PA-C Palliative Medicine  Please contact Palliative MedicineTeam phone at [phone] for questions and concerns between 7 am - 7 pm.   Please see AMION for individual provider pager numbers.

## 2019-11-08 NOTE — Progress Notes (Signed)
13 Days Post-Op Procedure(s) (LRB): MEDIASTERNAL WASH OUT,  ECMO DECANNULATION AND STERNAL CLOSURE (N/A) TRANSESOPHAGEAL ECHOCARDIOGRAM (TEE) (N/A) Subjective:  Unresponsive on vent  Objective: Vital signs in last 24 hours: Temp:  [97.3 F (36.3 C)-98 F (36.7 C)] 97.6 F (36.4 C) (04/25 1145) Pulse Rate:  [110-112] 110 (04/25 1205) Cardiac Rhythm: Sinus tachycardia (04/25 0400) Resp:  [17-38] 26 (04/25 1205) BP: (96-123)/(54-68) 101/59 (04/25 1205) SpO2:  [98 %-100 %] 100 % (04/25 1205) Arterial Line BP: (90-125)/(52-77) 102/59 (04/25 1000) FiO2 (%):  [40 %-50 %] 40 % (04/25 1205) Weight:  [66.9 kg] 66.9 kg (04/25 0429)  Hemodynamic parameters for last 24 hours: CVP:  [8 mmHg-21 mmHg] 8 mmHg  Intake/Output from previous day: 04/24 0701 - 04/25 0700 In: 2775.2 [I.V.:1715.1; NG/GT:530; IV Piggyback:530.1] Out: 2379  Intake/Output this shift: Total I/O In: 271.3 [I.V.:211.3; NG/GT:60] Out: 326 [Other:326]  General appearance: intubated on vent Neurologic: some posturing to stimulation Heart: regular rate and rhythm, S1, S2 normal, no murmur Lungs: clear to auscultation bilaterally Abdomen: soft, non-tender; bowel sounds normal Extremities: no edema Wound: incisions ok  Lab Results: Recent Labs    11/06/19 0616 11/06/19 0746  WBC  --  9.3  HGB 9.9* 8.2*  HCT 29.0* 24.1*  PLT  --  88*   BMET:  Recent Labs    11/07/19 1721 11/08/19 0442  NA 133* 129*  K 4.3 4.0  CL 99 96*  CO2 25 26  GLUCOSE 123* 432*  BUN 37* 29*  CREATININE 1.79* 1.55*  CALCIUM 8.3* 7.9*    PT/INR:  Recent Labs    11/07/19 0433  LABPROT 15.1  INR 1.2   ABG    Component Value Date/Time   PHART 7.420 11/06/2019 0616   HCO3 28.8 (H) 11/06/2019 0616   TCO2 30 11/06/2019 0616   ACIDBASEDEF 1.0 10/28/2019 1841   O2SAT 44.6 11/08/2019 0440   CBG (last 3)  Recent Labs    11/08/19 0457 11/08/19 0811 11/08/19 1148  GLUCAP 95 113* 102*    Assessment/Plan:  Hemodynamically  stable  Anoxic encephalopathy with only some posturing to stimulation. Poor prognosis per neurology. Palliative care working with family to decide about withdrawal of care and has meeting today at 4 pm. Continue supportive care.  Acute kidney failure on CRRT.  VDRF   LOS: 18 days    Gaye Pollack 11/08/2019

## 2019-11-08 NOTE — Procedures (Signed)
Admit: 10/19/2019 LOS: 41  57F dialysis dependent AKI with baseline CKD from lupus nephritis; s/p minimmally invasive TV repair, hemorrhagic / cardiogenic shock postoperative, status epiletpicus, TCP on IVIG  Current CRRT Prescription: Catheter: R IJ Temp HD cath BFR: 200 Pre Blood Pump: 500 4K DFR: 1500 4K Replacement Rate: 300 4K Goal UF: net even  Anticoagulation: fixed dose heparin 500u/h; AM pTT 52 Clotting: about 1x/24h  S:  NO interval events  K 4,0, p 2.5  Anuric  O: 04/24 0701 - 04/25 0700 In: 2775.2 [I.V.:1715.1; NG/GT:530; IV Piggyback:530.1] Out: 2379   Filed Weights   11/06/19 0500 11/07/19 0428 11/08/19 0429  Weight: 69.5 kg 66.5 kg 66.9 kg    Recent Labs  Lab 11/07/19 0434 11/07/19 1721 11/08/19 0442  NA 130* 133* 129*  K 4.1 4.3 4.0  CL 97* 99 96*  CO2 '26 25 26  '$ GLUCOSE 371* 123* 432*  BUN 36* 37* 29*  CREATININE 1.59* 1.79* 1.55*  CALCIUM 8.1* 8.3* 7.9*  PHOS 2.2* 4.8* 2.5   Recent Labs  Lab 11/04/19 0433 11/04/19 0436 11/05/19 0404 11/05/19 0543 11/05/19 1905 11/06/19 0616 11/06/19 0746  WBC 10.0  --  10.3  --   --   --  9.3  NEUTROABS  --   --  8.4*  --   --   --  7.7  HGB 9.1*   < > 9.5*   < > 9.9* 9.9* 8.2*  HCT 25.8*   < > 27.4*   < > 29.0* 29.0* 24.1*  MCV 82.4  --  83.5  --   --   --  84.0  PLT 78*  --  100*  --   --   --  88*   < > = values in this interval not displayed.    Scheduled Meds: . bisacodyl  10 mg Oral Daily   Or  . bisacodyl  10 mg Rectal Daily  . chlorhexidine gluconate (MEDLINE KIT)  15 mL Mouth Rinse BID  . Chlorhexidine Gluconate Cloth  6 each Topical Daily  . feeding supplement (PIVOT 1.5 CAL)  1,000 mL Per Tube Q24H  . levothyroxine  50 mcg Per Tube Q0600  . mouth rinse  15 mL Mouth Rinse 10 times per day  . pantoprazole (PROTONIX) IV  40 mg Intravenous QHS  . PHENObarbital  160 mg Intravenous BID  . sodium chloride flush  10-40 mL Intracatheter Q12H   Continuous Infusions: .  prismasol BGK 4/2.5  500 mL/hr at 11/08/19 0643  .  prismasol BGK 4/2.5 300 mL/hr at 11/07/19 1720  . sodium chloride Stopped (10/27/19 1308)  . amiodarone 30 mg/hr (11/08/19 0800)  . amiodarone    . dextrose 50 mL/hr at 11/08/19 0800  . fentaNYL infusion INTRAVENOUS 12 mcg/hr (11/08/19 0800)  . heparin 10,000 units/ 20 mL infusion syringe 500 Units/hr (11/08/19 0700)  . lacosamide (VIMPAT) IV Stopped (11/07/19 2313)  . levETIRAcetam Stopped (11/08/19 0035)  . milrinone 0.125 mcg/kg/min (11/08/19 0800)  . prismasol BGK 4/2.5 1,500 mL/hr at 11/08/19 0643  . propofol (DIPRIVAN) infusion Stopped (10/29/19 0415)   PRN Meds:.sodium chloride, artificial tears, dextrose, dextrose, fentaNYL, heparin, heparin, LORazepam, metoprolol tartrate, midazolam, ondansetron (ZOFRAN) IV, sodium chloride flush  ABG    Component Value Date/Time   PHART 7.420 11/06/2019 0616   PCO2ART 44.2 11/06/2019 0616   PO2ART 182 (H) 11/06/2019 0616   HCO3 28.8 (H) 11/06/2019 0616   TCO2 30 11/06/2019 0616   ACIDBASEDEF 1.0 10/28/2019 1841   O2SAT  44.6 11/08/2019 0440    A/P  1. 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 Shock, cardiogenic and hemorrhagic, on inotrope; significant RV failure 3. VDRF. per CCM / AHF / TCTS 4. Status post minimally invasive tricuspid valve repair 10/25/2019 5. ABLA  6. Convulsive status epilepticus, neurology following; anoxic encephalopathy 7. SLE 8. ITP, on IVIG  Cont CRRT current settings.  Keep even UF; palliative working with family regarding timing of transition to full comfort measures  Rexene Agent , MD [employer]

## 2019-11-08 NOTE — Progress Notes (Signed)
Patient ID: [name], female   DOB: 30-Nov-1970, 49 y.o.   MRN: [id number] P    Advanced Heart Failure Rounding Note  PCP-Cardiologist: No primary care provider on file.   Subjective:    Remains unresponsive. Off sedation since 4/15.   Yesterday increased WOB on vent. Fentanyl increase. Now appears more comfortable.  Remains on CVVHD (net even). IV milrinone and amiodarone. Co-ox 45%   Objective:   Weight Range: 66.9 kg Body mass index is 23.1 kg/m.   Vital Signs:   Temp:  [97.3 F (36.3 C)-98 F (36.7 C)] 97.3 F (36.3 C) (04/25 0812) Pulse Rate:  [110-114] 110 (04/25 0355) Resp:  [17-38] 17 (04/25 1000) BP: (96-123)/(54-68) 103/60 (04/25 0355) SpO2:  [98 %-100 %] 100 % (04/25 0700) Arterial Line BP: (90-125)/(52-77) 102/59 (04/25 1000) FiO2 (%):  [50 %] 50 % (04/25 0400) Weight:  [66.9 kg] 66.9 kg (04/25 0429) Last BM Date: 11/07/19  Weight change: Filed Weights   11/06/19 0500 11/07/19 0428 11/08/19 0429  Weight: 69.5 kg 66.5 kg 66.9 kg    Intake/Output:   Intake/Output Summary (Last 24 hours) at 11/08/2019 1038 Last data filed at 11/08/2019 1000 Gross per 24 hour  Intake 2690.23 ml  Output 2415 ml  Net 275.23 ml      Physical Exam    General:  Critically ill appearing. Unresponsive.  HEENT: normal +ETT Neck: supple.JVP to jaw  Carotids 2+ bilat; no bruits. No lymphadenopathy or thryomegaly appreciated. Cor: Incision crusted over. Stable. PMI nondisplaced. Regular rate & rhythm. No rubs, gallops or murmurs. Lungs: coarse Abdomen: soft, nontender, nondistended. No hepatosplenomegaly. No bruits or masses. Good bowel sounds. Extremities: no cyanosis, clubbing, rash, tr edema Neuro: unresponsive on vent   Telemetry   AFL  Rate 110-120 Personally reviewed  Labs    CBC Recent Labs    11/06/19 0616 11/06/19 0746  WBC  --  9.3  NEUTROABS  --  7.7  HGB 9.9* 8.2*  HCT 29.0* 24.1*  MCV  --  84.0  PLT  --  88*   Basic Metabolic Panel Recent  Labs    11/07/19 0433 11/07/19 0434 11/07/19 1721 11/08/19 0442  NA  --    < > 133* 129*  K  --    < > 4.3 4.0  CL  --    < > 99 96*  CO2  --    < > 25 26  GLUCOSE  --    < > 123* 432*  BUN  --    < > 37* 29*  CREATININE  --    < > 1.79* 1.55*  CALCIUM  --    < > 8.3* 7.9*  MG 2.5*  --   --  2.4  PHOS  --    < > 4.8* 2.5   < > = values in this interval not displayed.   Liver Function Tests Recent Labs    11/06/19 0430 11/06/19 1620 11/07/19 0433 11/07/19 0434 11/07/19 1721 11/08/19 0442  AST 53*  --  38  --   --   --   ALT 27  --  23  --   --   --   ALKPHOS 187*  --  177*  --   --   --   BILITOT 1.8*  --  1.5*  --   --   --   PROT 7.3  --  6.7  --   --   --   ALBUMIN 2.5*  2.5*   < >  2.4*   < > 2.3* 2.4*   < > = values in this interval not displayed.   No results for input(s): LIPASE, AMYLASE in the last 72 hours. Cardiac Enzymes No results for input(s): CKTOTAL, CKMB, CKMBINDEX, TROPONINI in the last 72 hours.  BNP: BNP (last 3 results) Recent Labs    04/15/19 1101  BNP 248.7*    ProBNP (last 3 results) No results for input(s): PROBNP in the last 8760 hours.   D-Dimer No results for input(s): DDIMER in the last 72 hours. Hemoglobin A1C No results for input(s): HGBA1C in the last 72 hours. Fasting Lipid Panel No results for input(s): CHOL, HDL, LDLCALC, TRIG, CHOLHDL, LDLDIRECT in the last 72 hours. Thyroid Function Tests No results for input(s): TSH, T4TOTAL, T3FREE, THYROIDAB in the last 72 hours.  Invalid input(s): FREET3  Other results:   Imaging    No results found.   Medications:     Scheduled Medications: . bisacodyl  10 mg Oral Daily   Or  . bisacodyl  10 mg Rectal Daily  . chlorhexidine gluconate (MEDLINE KIT)  15 mL Mouth Rinse BID  . Chlorhexidine Gluconate Cloth  6 each Topical Daily  . feeding supplement (PIVOT 1.5 CAL)  1,000 mL Per Tube Q24H  . levothyroxine  50 mcg Per Tube Q0600  . mouth rinse  15 mL Mouth Rinse 10  times per day  . pantoprazole (PROTONIX) IV  40 mg Intravenous QHS  . PHENObarbital  160 mg Intravenous BID  . sodium chloride flush  10-40 mL Intracatheter Q12H    Infusions: .  prismasol BGK 4/2.5 500 mL/hr at 11/08/19 0643  .  prismasol BGK 4/2.5 300 mL/hr at 11/08/19 1024  . sodium chloride Stopped (10/27/19 1308)  . amiodarone 30 mg/hr (11/08/19 1000)  . amiodarone    . dextrose 50 mL/hr at 11/08/19 1000  . fentaNYL infusion INTRAVENOUS 12 mcg/hr (11/08/19 1000)  . heparin 10,000 units/ 20 mL infusion syringe 500 Units/hr (11/08/19 0700)  . lacosamide (VIMPAT) IV 100 mg (11/08/19 1026)  . levETIRAcetam Stopped (11/08/19 0035)  . milrinone 0.125 mcg/kg/min (11/08/19 1000)  . prismasol BGK 4/2.5 1,500 mL/hr at 11/08/19 1024  . propofol (DIPRIVAN) infusion Stopped (10/29/19 0415)    PRN Medications: sodium chloride, artificial tears, dextrose, dextrose, fentaNYL, heparin, heparin, LORazepam, metoprolol tartrate, midazolam, ondansetron (ZOFRAN) IV, sodium chloride flush     Assessment/Plan   1. Post-cardiotomy hemorrhagic shock - Patient with refractory post-operative chest bleeding s/p minimally invasive TV repair on 11/09/2019 in setting of longstanding SLE - Treated with RBCS, PLTs, TXA, cryo, Factor 7, steroids - ECMO started in OR - Repeat OR visit for right chest clot removal on 4/9 with improved hemodynamics, TEE with EF 40-45%, mild-moderately decreased RV function, stable replaced TV.  - ECMO decannulated 4/12.  - On milrinone but co-ox low. Now back in shock  2. Acute systolic CHF, primarily RV failure with cardiogenic shock - Volume status maintained on CVVHD. Weight below pre-op/ Keeping even. On milrinone, co-ox low c/w recurrent shock.   3. 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   4. Post-operative respiratory failure - remains intubated, FiO2 0.5.  Unable to wean due to Neuro status  5. Convulsive status epilepticus - Noted  on 4/10 EEG.  - She is now on Vimpat and Keppra.  - With ongoing seizures, she is now off Propofol and Versed, started phenobarbital IV.  No further seizure activity.  - Head CT 4/15  with significant artifact but may show some anoxic injury.  Unable to get MRI due to retained wire.  - No awakening yet off sedation, now off since 4/15 hrs off sedation. Neurology following.  - No longer seizing but has not woken up. Picture c/w severe anoxic brain injury. Potential posturing on exam today per RN   6. AKI  - CVVH ongoing, - keeping even   7. Atrial fibrillation/flutter - She remains in atrial flutter at rates around 110-120.  Fortunately hemodynamics are stable. - Tolerating low-dose heparin    - Continue IV amio for now  8. Goals of care -> DNR - Palliative care is following. - She appears to have severe anoxic brain injury with very poor prognosis.   She is actively dying. Palliative Care team very involved and patient DNR but family has been hesitant to make full switch to [place]. Patient now appears to be suffering. Will need to continue to work with family to realize that she has no chance of meaningful recovery and need to focus on ensuring her comfort. I discussed again with Palliative Care team today. Family meeting at [place]. Hopefully we can set limits here to prevent further suffering as we have reached medical futility.    CRITICAL CARE Performed by: Glori Bickers  Total critical care time: 40 minutes  Critical care time was exclusive of separately billable procedures and treating other patients.  Critical care was necessary to treat or prevent imminent or life-threatening deterioration.  Critical care was time spent personally by me (independent of midlevel providers or residents) on the following activities: development of treatment plan with patient and/or surrogate as well as nursing, discussions with consultants, evaluation of patient's response to treatment,  examination of patient, obtaining history from patient or surrogate, ordering and performing treatments and interventions, ordering and review of laboratory studies, ordering and review of radiographic studies, pulse oximetry and re-evaluation of patient's condition.    Length of Stay: 44  Glori Bickers, MD  11/08/2019, 10:38 AM  Advanced Heart Failure Team Pager [phone] (M-F; 7a - 4p)  Please contact [facility] for night-coverage after hours (4p -7a ) and weekends on [email or web address]

## 2019-11-09 DIAGNOSIS — Z515 Encounter for palliative care: Secondary | ICD-10-CM

## 2019-11-09 DIAGNOSIS — Z66 Do not resuscitate: Secondary | ICD-10-CM | POA: Diagnosis not present

## 2019-11-09 DIAGNOSIS — Z7189 Other specified counseling: Secondary | ICD-10-CM | POA: Diagnosis not present

## 2019-11-09 NOTE — Progress Notes (Signed)
Nutrition Brief Note  Chart reviewed. Pt now transitioning to comfort care. TF discontinued by MD.  Per chart review, pt to remains on vent support until 11/14/2019. No further nutrition interventions warranted at this time.  Please re-consult as needed.   Kerman Passey MS, RDN, LDN, CNSC RD Pager Number and RD On-Call Pager Number Located in [place]

## 2019-11-09 NOTE — Progress Notes (Signed)
Patient ID: [name], female   DOB: [birth year], 49 y.o.   MRN: [id number] P    Advanced Heart Failure Rounding Note  PCP-Cardiologist: No primary care provider on file.   Subjective:    Remains unresponsive. Off sedation since 4/15.   Family meeting yesterday. Decided to stop CVVHD and other aggressive measures but want to keep on vent until 5/1. No labs being drawn.   Off milirinone and amio this am as well  Objective:   Weight Range: 66.9 kg Body mass index is 23.1 kg/m.   Vital Signs:   Temp:  [97.6 F (36.4 C)-97.7 F (36.5 C)] 97.7 F (36.5 C) (04/25 1509) Pulse Rate:  [110-117] 117 (04/26 0600) Resp:  [0-27] 0 (04/26 0600) BP: (101-111)/(59-65) 109/62 (04/26 0332) SpO2:  [87 %-100 %] 96 % (04/26 0600) Arterial Line BP: (80-126)/(48-72) 84/51 (04/26 0600) FiO2 (%):  [40 %-45 %] 40 % (04/26 0726) Last BM Date: 11/08/19  Weight change: Filed Weights   11/06/19 0500 11/07/19 0428 11/08/19 0429  Weight: 69.5 kg 66.5 kg 66.9 kg    Intake/Output:   Intake/Output Summary (Last 24 hours) at 11/09/2019 0841 Last data filed at 11/09/2019 0600 Gross per 24 hour  Intake 1546 ml  Output 1384 ml  Net 162 ml      Physical Exam    General:  Critically ill appearing. Unresponsive.  HEENT: normal +ETT Neck: supple. no JVD. Carotids 2+ bilat; no bruits. No lymphadenopathy or thryomegaly appreciated. Cor: Incisions ok PMI nondisplaced. Irregular tachy. No rubs, gallops or murmurs. Lungs: clear Abdomen: soft, nontender, nondistended. No hepatosplenomegaly. No bruits or masses. Good bowel sounds. Extremities: no cyanosis, clubbing, rash, edema Neuro: unresponsive   Telemetry   AFL  Rate 11-120 Personally reviewed  Labs    CBC No results for input(s): WBC, NEUTROABS, HGB, HCT, MCV, PLT in the last 72 hours. Basic Metabolic Panel Recent Labs    11/07/19 0433 11/07/19 0434 11/07/19 1721 11/08/19 0442  NA  --    < > 133* 129*  K  --    < > 4.3 4.0  CL   --    < > 99 96*  CO2  --    < > 25 26  GLUCOSE  --    < > 123* 432*  BUN  --    < > 37* 29*  CREATININE  --    < > 1.79* 1.55*  CALCIUM  --    < > 8.3* 7.9*  MG 2.5*  --   --  2.4  PHOS  --    < > 4.8* 2.5   < > = values in this interval not displayed.   Liver Function Tests Recent Labs    11/07/19 0433 11/07/19 0434 11/07/19 1721 11/08/19 0442  AST 38  --   --   --   ALT 23  --   --   --   ALKPHOS 177*  --   --   --   BILITOT 1.5*  --   --   --   PROT 6.7  --   --   --   ALBUMIN 2.4*   < > 2.3* 2.4*   < > = values in this interval not displayed.   No results for input(s): LIPASE, AMYLASE in the last 72 hours. Cardiac Enzymes No results for input(s): CKTOTAL, CKMB, CKMBINDEX, TROPONINI in the last 72 hours.  BNP: BNP (last 3 results) Recent Labs    04/15/19 1101  BNP 248.7*  ProBNP (last 3 results) No results for input(s): PROBNP in the last 8760 hours.   D-Dimer No results for input(s): DDIMER in the last 72 hours. Hemoglobin A1C No results for input(s): HGBA1C in the last 72 hours. Fasting Lipid Panel No results for input(s): CHOL, HDL, LDLCALC, TRIG, CHOLHDL, LDLDIRECT in the last 72 hours. Thyroid Function Tests No results for input(s): TSH, T4TOTAL, T3FREE, THYROIDAB in the last 72 hours.  Invalid input(s): FREET3  Other results:   Imaging    No results found.   Medications:     Scheduled Medications: . bisacodyl  10 mg Oral Daily   Or  . bisacodyl  10 mg Rectal Daily  . chlorhexidine gluconate (MEDLINE KIT)  15 mL Mouth Rinse BID  . Chlorhexidine Gluconate Cloth  6 each Topical Daily  . mouth rinse  15 mL Mouth Rinse 10 times per day  . PHENObarbital  160 mg Intravenous BID  . sodium chloride flush  10-40 mL Intracatheter Q12H    Infusions: . sodium chloride Stopped (10/27/19 1308)  . amiodarone 30 mg/hr (11/09/19 0627)  . amiodarone    . fentaNYL infusion INTRAVENOUS 15 mcg/hr (11/09/19 0600)  . lacosamide (VIMPAT) IV Stopped  (11/09/19 0013)  . levETIRAcetam Stopped (11/09/19 0127)  . milrinone 0.125 mcg/kg/min (11/09/19 0600)    PRN Medications: sodium chloride, antiseptic oral rinse, artificial tears, fentaNYL, glycopyrrolate **OR** glycopyrrolate **OR** glycopyrrolate, haloperidol **OR** haloperidol **OR** haloperidol lactate, LORazepam, LORazepam, ondansetron **OR** ondansetron (ZOFRAN) IV, sodium chloride flush     Assessment/Plan   1. Post-cardiotomy hemorrhagic shock 2. Acute systolic CHF, primarily RV failure with cardiogenic shock 3. 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  4. Post-operative respiratory failure - remains intubated, FiO2 0.5.  Unable to wean due to Neuro status 5. Convulsive status epilepticus - No longer seizing but has not woken up. Picture c/w severe anoxic brain injury. Potential posturing on exam today per RN  6. AKI  - CVVH off 7. Atrial fibrillation/flutter 8. Goals of care -> DNR - Palliative care is following. - She appears to have severe anoxic brain injury with very poor prognosis.   She is actively dying. Palliative Care team very involved and patient DNR but family has been hesitant to make full switch to [place]. Based on conversation yesterday have stopped all aggressive measures but want to keep on vent until 5/1. No labs being drawn. I worry this approach may lead to patient suffering. Low threshold to switch to full comfort care.     Length of Stay: [place], MD  11/09/2019, 8:41 AM  Advanced Heart Failure Team Pager [phone] (M-F; 7a - 4p)  Please contact [facility] for night-coverage after hours (4p -7a ) and weekends on [email or web address]

## 2019-11-09 NOTE — Progress Notes (Addendum)
      [address]       [place] [address]             [phone]        CARDIOTHORACIC SURGERY PROGRESS NOTE   R14 Days Post-Op Procedure(s) (LRB): MEDIASTERNAL WASH OUT,  ECMO DECANNULATION AND STERNAL CLOSURE (N/A) TRANSESOPHAGEAL ECHOCARDIOGRAM (TEE) (N/A)  Subjective: Unresponsive to noxious stimuli.  No purposeful neurologic activity.  Objective: Vital signs: BP Readings from Last 1 Encounters:  11/09/19 109/62   Pulse Readings from Last 1 Encounters:  11/09/19 (!) 117   Resp Readings from Last 1 Encounters:  11/09/19 (!) 0   Temp Readings from Last 1 Encounters:  11/08/19 97.7 F (36.5 C)    Hemodynamics:    Physical Exam:  Breath sounds: Coarse, symmetrical  Heart sounds:  RRR  Incisions:  Clean and dry  Abdomen:  Soft, non-distended, tolerating tube feeds  Extremities:  Warm, well-perfused   Intake/Output from previous day: 04/25 0701 - 04/26 0700 In: 1646.9 [I.V.:1030.6; NG/GT:270; IV Piggyback:346.3] Out: 1459 [Stool:100] Intake/Output this shift: No intake/output data recorded.  Lab Results:  CBC:No results for input(s): WBC, HGB, HCT, PLT in the last 72 hours.  BMET:  Recent Labs    11/07/19 1721 11/08/19 0442  NA 133* 129*  K 4.3 4.0  CL 99 96*  CO2 25 26  GLUCOSE 123* 432*  BUN 37* 29*  CREATININE 1.79* 1.55*  CALCIUM 8.3* 7.9*     PT/INR:   Recent Labs    11/07/19 0433  LABPROT 15.1  INR 1.2    CBG (last 3)  Recent Labs    11/08/19 0457 11/08/19 0811 11/08/19 1148  GLUCAP 95 113* 102*    ABG    Component Value Date/Time   PHART 7.420 11/06/2019 0616   PCO2ART 44.2 11/06/2019 0616   PO2ART 182 (H) 11/06/2019 0616   HCO3 28.8 (H) 11/06/2019 0616   TCO2 30 11/06/2019 0616   ACIDBASEDEF 1.0 10/28/2019 1841   O2SAT 44.6 11/08/2019 0440    CXR: n/a  Assessment/Plan: S/P Procedure(s) (LRB): MEDIASTERNAL WASH OUT,  ECMO DECANNULATION AND STERNAL CLOSURE (N/A) TRANSESOPHAGEAL  ECHOCARDIOGRAM (TEE) (N/A)  Goals of care reportedly addressed by Palliative Care Team via meeting with family yesterday.  Note reviewed.  Now off bedside telemetry and off CRRT.  Comfort care measures in place with plans for extubation on 5/1.  No more lab draws or x-rays.  Will stop milrinone and amiodarone.  I would favor removing Aline, definitely would not replace it.  I agree w/ Dr Haroldine Laws that full comfort care measures aimed to minimize any risk of suffering are most appropriate at this point.  Rexene Alberts, MD 11/09/2019 8:40 AM

## 2019-11-09 NOTE — Progress Notes (Signed)
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  She states CRRT stopped yesterday during day shift after goals of care meeting with family.  She is not getting further lab draws and is to undergo palliative extubation on 5/1 per the nurse.  Had 1.4 liters UF over 4/25 with CRRT.   Review of systems:  Unable to be obtained 2/2 AMS, intubated  O: 04/25 0701 - 04/26 0700 In: 1646.9 [I.V.:1030.6; NG/GT:270; IV Piggyback:346.3] Out: 1459 [Stool:100]  Filed Weights   11/06/19 0500 11/07/19 0428 11/08/19 0429  Weight: 69.5 kg 66.5 kg 66.9 kg    Physical exam:  General adult female in bed  Neck supple trachea midline Lungs coarse mechanical breath sounds Heart S1S2 no rub Abdomen soft nontender nondistended Extremities no pitting edema  Neuro - sedation running Access: temp catheter in place   Recent Labs  Lab 11/07/19 0434 11/07/19 1721 11/08/19 0442  NA 130* 133* 129*  K 4.1 4.3 4.0  CL 97* 99 96*  CO2 _0 GLUCOSE 371* 123* 432*  BUN 36* 37* 29*  CREATININE 1.59* 1.79* 1.55*  CALCIUM 8.1* 8.3* 7.9*  PHOS 2.2* 4.8* 2.5   Recent Labs  Lab 11/04/19 0433 11/04/19 0436 11/05/19 0404 11/05/19 0543 11/05/19 1905 11/06/19 0616 11/06/19 0746  WBC 10.0  --  10.3  --   --   --  9.3  NEUTROABS  --   --  8.4*  --   --   --  7.7  HGB 9.1*   < > 9.5*   < > 9.9* 9.9* 8.2*  HCT 25.8*   < > 27.4*   < > 29.0* 29.0* 24.1*  MCV 82.4  --  83.5  --   --   --  84.0  PLT 78*  --  100*  --   --   --  88*   < > = values in this interval not displayed.    Scheduled Meds: . bisacodyl  10 mg Oral Daily   Or  . bisacodyl  10 mg Rectal Daily  . chlorhexidine gluconate (MEDLINE KIT)  15 mL Mouth Rinse BID  . Chlorhexidine Gluconate Cloth  6 each Topical Daily  . mouth rinse  15 mL Mouth Rinse 10  times per day  . PHENObarbital  160 mg Intravenous BID  . sodium chloride flush  10-40 mL Intracatheter Q12H   Continuous Infusions: . sodium chloride Stopped (10/27/19 1308)  . amiodarone 30 mg/hr (11/09/19 0627)  . amiodarone    . fentaNYL infusion INTRAVENOUS 15 mcg/hr (11/09/19 0600)  . lacosamide (VIMPAT) IV Stopped (11/09/19 0013)  . levETIRAcetam Stopped (11/09/19 0127)  . milrinone 0.125 mcg/kg/min (11/09/19 0600)   PRN Meds:.sodium chloride, antiseptic oral rinse, artificial tears, fentaNYL, glycopyrrolate **OR** glycopyrrolate **OR** glycopyrrolate, haloperidol **OR** haloperidol **OR** haloperidol lactate, LORazepam, LORazepam, ondansetron **OR** ondansetron (ZOFRAN) IV, sodium chloride flush  ABG    Component Value Date/Time   PHART 7.420 11/06/2019 0616   PCO2ART 44.2 11/06/2019 0616   PO2ART 182 (H) 11/06/2019 0616   HCO3 28.8 (H) 11/06/2019 0616   TCO2 30 11/06/2019 0616   ACIDBASEDEF 1.0 10/28/2019 1841   O2SAT 44.6 11/08/2019 0440    A/P  1. Dialysis dependent AKI on CKD3.  Now off of CRRT and not getting labs with plans for palliative extubation in several days.  Temp cath removal per nursing 2. Shock, cardiogenic and hemorrhagic, on inotrope; significant RV failure 3. Acute hypoxic resp failure - on mechanical ventilation . per CCM 4. Status post minimally invasive tricuspid valve repair 10/15/2019 5. Acute blood loss anemia - no further labs per nursing 6. Convulsive status epilepticus, neurology following 7. anoxic encephalopathy - neurology and palliative following 8. SLE - noted  9. ITP, on IVIG  Will sign off.  She is off of CRRT and is not to get further lab draws per nursing.  Please do not hesitate to contact me with any questions or if we can be of any assistance.  Appreciate staff support of the patient and her family.   Claudia Desanctis , MD [facility] 6:54 AM 11/09/2019

## 2019-11-09 NOTE — Progress Notes (Signed)
Daily Progress Note   Patient Name: [name]       Date: 11/09/2019 DOB: [birth year]  Age: 49 y.o. MRN#: [id number] Attending Physician: Rexene Alberts, MD Primary Care Physician: [name], MD Admit Date: 11/03/2019  Reason for Consultation/Follow-up: Establishing goals of care and Non pain symptom management  Subjective: Patient appears comfortable on ventilator. Is nonresponsive to my voice and touch.  No family at bedside.  ROS  Length of Stay: 19  Current Medications: Scheduled Meds:  . bisacodyl  10 mg Oral Daily   Or  . bisacodyl  10 mg Rectal Daily  . chlorhexidine gluconate (MEDLINE KIT)  15 mL Mouth Rinse BID  . Chlorhexidine Gluconate Cloth  6 each Topical Daily  . mouth rinse  15 mL Mouth Rinse 10 times per day  . PHENObarbital  160 mg Intravenous BID  . sodium chloride flush  10-40 mL Intracatheter Q12H    Continuous Infusions: . sodium chloride Stopped (10/27/19 1308)  . fentaNYL infusion INTRAVENOUS 15 mcg/hr (11/09/19 1200)  . lacosamide (VIMPAT) IV Stopped (11/09/19 1033)  . levETIRAcetam 1,000 mg (11/09/19 1201)    PRN Meds: sodium chloride, antiseptic oral rinse, artificial tears, fentaNYL, glycopyrrolate **OR** glycopyrrolate **OR** glycopyrrolate, haloperidol **OR** haloperidol **OR** haloperidol lactate, LORazepam, LORazepam, ondansetron **OR** ondansetron (ZOFRAN) IV, sodium chloride flush  Physical Exam          Vital Signs: BP 109/62   Pulse (!) 113   Temp 97.7 F (36.5 C)   Resp 13   Ht _0  (1.702 m)   Wt 66.9 kg   LMP 04/02/2017   SpO2 97%   BMI 23.10 kg/m  SpO2: SpO2: 97 % O2 Device: O2 Device: Ventilator O2 Flow Rate: O2 Flow Rate (L/min): 60 L/min  Intake/output summary:   Intake/Output Summary (Last 24 hours) at  11/09/2019 1303 Last data filed at 11/09/2019 1200 Gross per 24 hour  Intake 1135.13 ml  Output 721 ml  Net 414.13 ml   LBM: Last BM Date: 11/09/19 Baseline Weight: Weight: 71 kg Most recent weight: Weight: 66.9 kg       Palliative Assessment/Data: PPS: 10%   Flowsheet Rows     Most Recent Value  Intake Tab  Referral Department  Surgery  Unit at Time of Referral  ICU  Palliative Care Primary Diagnosis  Cardiac  Date Notified  11/13/2019  Palliative Care Type  New Palliative care  Reason for referral  Other (Comment) [ECMO]  Date of Admission  10/30/2019  Date first seen by Palliative Care  10/25/19  # of days Palliative referral response time  2 Day(s)  # of days IP prior to Palliative referral  2  Clinical Assessment  Psychosocial & Spiritual Assessment  Palliative Care Outcomes      Patient Active Problem List   Diagnosis Date Noted  . Goals of care, counseling/discussion   . Pressure injury of skin 11/05/2019  . Hypoxic encephalopathy ([place])   . DNR (do not resuscitate)   . LVAD (left ventricular assist device) present ([place])   . Cardiorespiratory failure ([place])   . Status epilepticus ([place])   . Palliative care encounter   . Acute respiratory failure with hypoxia ([place])   . Acute on chronic right heart failure ([place])   . Coagulopathy ([place])   . S/P minimally invasive tricuspid valve repair 10/28/2019  . Pulmonary hypertension ([place])   . Tricuspid regurgitation   . Menopause 05/16/2018  . Vitamin D deficiency 05/16/2018  . Peripheral edema 05/15/2018  . Obesity 09/11/2016  . Upper airway cough syndrome 10/14/2013  . 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 She made another trip back to the OR for evacuation of hemothorax. She subsequently developed status epilepticus. After treatment with dual antiseizure medications and sedation the status stopped and she has been having intermittent seizing episodes since. There is some concern for a degree of anoxic brain injury. She is currently slated for ECMO decannulation on 4/12 am.  Assessment/Recommendations/Plan   Family wishes to wait until 5/1 for full transition to comfort and withdrawal of life sustaining measures in order to avoid patient dying during daughter's birthday month  Patient appears comfortable - palliative will continue to follow and assist with symptom management  Goals of Care and Additional Recommendations:  Limitations on Scope of Treatment: Minimize Medications and No Hemodialysis  Code Status:  DNR  Prognosis:   < 2 weeks  Discharge Planning:  Anticipated Hospital Death  Care plan was discussed with patient's nurse.  Thank you for allowing the Palliative Medicine Team to assist in the care of this patient.   Time In: 16-Sep-1033 Time Out: 1050 Total Time 15 mins Prolonged Time Billed no      Greater than 50%  of this time was spent counseling and coordinating care related to the above assessment and plan.  Mariana Kaufman, AGNP-C Palliative Medicine   Please contact Palliative Medicine Team phone at [phone] for questions and concerns.

## 2019-11-09 NOTE — Progress Notes (Signed)
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  Post op course complicated by hemorrhagic effusion in R thorax, Vfib arrest, AKI requiring CVVHD, severe cardiogenic shock requiring ECMO support and anoxic brain injury.  Past Medical History  Avascular necrosis DM2 Hypertension Hypothyroidism SLE Pulmonary hypertension ITP  Consults:  PCCM, neurology, advanced heart failure, palliative care  Procedures:  4/7 tricuspid valve repair through median sternotomy 4/8 reexploration of the mediastinum with cannulation for ECMO 4/12 decannulation from [facility] ECMO  4/14 HD cath right IJ 4/19 chest tube and foley removed 4/25 Transitioned to comfort care   Significant Diagnostic Tests:  Transesophageal echocardiogram January 2021  1. Left ventricular ejection fraction, by visual estimation, is 60 to  65%. The left ventricle has normal function. There is no left ventricular  hypertrophy.  2. The left ventricle has no regional wall motion abnormalities.  3. Global right ventricle has mildly reduced systolic function.The right  ventricular size is normal. No increase in right ventricular wall  thickness.  4. Left atrial size was moderately dilated.  5. Right atrial size was severely dilated.  6. The mitral valve is normal in structure. Trivial mitral valve  regurgitation.  7. The tricuspid valve is abnormal.  8. The tricuspid valve is abnormal. Tricuspid valve regurgitation is  severe.  9. Apparent mild restriction of septal leaflet. RVSP ~ 3mmHG.  10. The aortic valve is normal in structure. Aortic valve regurgitation is  not visualized.  11. The pulmonic valve was grossly normal. Pulmonic valve regurgitation is  not visualized.    12. Mild plaque invoving the descending aorta.  13. Severely elevated pulmonary artery systolic pressure.   Right heart catheterization June 2020 Findings:  RA = 5 RV = 34/7 PA = 34/6 (20) PCW = 8 Fick cardiac output/index = 5.9/3.1 PVR = 2.0 WU Ao sat = 100% PA sat = 71%, 72% SVC sat = 66%  Assessment: 1. Minimally elevated PA pressures with normal PVR and cardiac output 2. No evidence of significant intracardiac shunt  CT Head 4/15: Evaluation limited by streak and beam hardening artifact arising from scalp monitoring leads.  Apparent asymmetric hypodensity within the right cerebellar hemisphere may reflect asymmetric beam hardening artifact. Edema related to infarct or hypoxic/ischemic injury cannot be excluded. Consider brain MRI for further evaluation, as clinically warranted.  Paranasal sinus mucosal thickening.  Large bilateral mastoid effusions. Associated left middle ear effusion.  Micro Data:  4/9 wound culture >  4/16 trach aspirate > c albicans 4/17 trach aspirate> c albicans  Antimicrobials:  Levofloxacin 4/7 >> 4/8 Vancomycin 4/7 >> 4/12 Meropenem 4/9 >> 4/13  Interim history/subjective:  No acute events overnight. Off CRRT. Appears comfortable this AM.   Objective   Blood pressure 109/62, pulse (!) 117, temperature 97.7 F (36.5 C), resp. rate (!) 0, height 5\' 7"  (1.702 m), weight 66.9 kg, last menstrual period 04/02/2017, SpO2 96 %.    Vent Mode: PRVC FiO2 (%):  [40 %-45 %] 40 % Set Rate:  [26 bmp] 26 bmp Vt Set:  [400 mL] 400 mL PEEP:  [5 cmH20] 5 cmH20 Plateau Pressure:  [19 cmH20-23 cmH20] 19 cmH20   Intake/Output Summary (Last 24 hours) at 11/09/2019 0857 Last data filed at 11/09/2019 0600 Gross per  24 hour  Intake 1546 ml  Output 1384 ml  Net 162 ml   Filed Weights   11/06/19 0500 11/07/19 0428 11/08/19 0429  Weight: 69.5 kg 66.5 kg 66.9 kg    Examination: General: critically ill appearing female, resting comfortably  in bed  HENT: NCAT, ETT in place  PULM: coarse breath sounds bilaterally, diminished breath sounds on the R  CV: tachy, no mrg  GI: soft, non-distended, hypoactive bowel sounds  Neuro: remains unresponsive off sedation   Resolved Hospital Problem list     Assessment & Plan:  Acute hypoxemic respiratory failure: As she remains unresponsive it is unreasonable to consider extubation except in the context of withdrawal of care and comfort measures Continue full mechanical ventilatory support until May 1st. Family aware she may pass before this date.  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 Off IVIG as of April 19 Continue to monitor for evidence of bleeding or clotting   Anoxic brain injury Status epilepticus resolved Acute anoxic encephalopathy Physical exam supports severe anoxic brain injury Now transitioned to comfort care as above  WHO group 1 PAH Maintain off of pulmonary vasodilators other than oxygen  Best practice:  Diet: Tube feeding Pain/Anxiety/Delirium protocol (if indicated): fentanyl gtt  VAP protocol (if indicated): yes DVT prophylaxis: SCD GI prophylaxis: PPI Glucose control: monitor Mobility: bed rest Code Status: DNR Family Communication: per palliative/primary Disposition:   Labs   CBC: Recent Labs  Lab 11/03/19 0355 11/03/19 0427 11/04/19 0433 11/04/19 0436 11/05/19 0404 11/05/19 0543 11/05/19 1905 11/06/19 0616 11/06/19 0746  WBC 11.4*  --  10.0  --  10.3  --   --   --  9.3  NEUTROABS  --   --   --   --  8.4*  --   --   --  7.7  HGB 9.0*   < > 9.1*   < > 9.5* 11.2* 9.9* 9.9* 8.2*  HCT 26.7*   <  > 25.8*   < > 27.4* 33.0* 29.0* 29.0* 24.1*  MCV 86.1  --  82.4  --  83.5  --   --   --  84.0  PLT 50*  --  78*  --  100*  --   --   --  88*   < > = values in this interval not displayed.    Basic Metabolic Panel: Recent Labs  Lab 11/05/19 0404 11/05/19 0543 11/06/19 0430 11/06/19 0017 11/06/19 0746 11/06/19 1620 11/07/19 0433 11/07/19 0434 11/07/19 1721 11/08/19 0442  NA 135   < > 134*   < > 133* 132*  --  130* 133* 129*  K 4.5   < > 4.3   < > 4.4 4.3  --  4.1 4.3 4.0  CL 101   < > 99   < > 100 100  --  97* 99 96*  CO2 26   < > 24   < > 25 24  --  26 25 26   GLUCOSE 75   < > 102*   < > 120* 115*  --  371* 123* 432*  BUN 41*   < > 45*   < > 42* 41*  --  36* 37* 29*  CREATININE 1.74*   < > 2.04*   < > 2.01* 1.75*  --  1.59* 1.79* 1.55*  CALCIUM 8.6*   < > 8.6*   < > 8.2* 8.3*  --  8.1* 8.3* 7.9*  MG 2.8*  --  2.6*  --  2.6*  --  2.5*  --   --  2.4  PHOS 3.5   < > 3.2  --   --  2.6  --  2.2* 4.8* 2.5   < > = values in this interval not displayed.   GFR: Estimated Creatinine Clearance: 44.1 mL/min (A) (by C-G formula based on SCr of 1.55 mg/dL (H)). Recent Labs  Lab 11/03/19 0355 11/04/19 0433 11/05/19 0404 11/06/19 0746  WBC 11.4* 10.0 10.3 9.3    Liver Function Tests: Recent Labs  Lab 11/03/19 0355 11/03/19 1629 11/04/19 0433 11/04/19 1555 11/05/19 0404 11/05/19 1600 11/06/19 0430 11/06/19 0430 11/06/19 1620 11/07/19 0433 11/07/19 0434 11/07/19 1721 11/08/19 0442  AST 47*  --  49*  --  55*  --  53*  --   --  38  --   --   --   ALT 22  --  23  --  24  --  27  --   --  23  --   --   --   ALKPHOS 165*  --  169*  --  193*  --  187*  --   --  177*  --   --   --   BILITOT 1.1  --  1.3*  --  1.5*  --  1.8*  --   --  1.5*  --   --   --   PROT 7.5  --  7.5  --  7.5  --  7.3  --   --  6.7  --   --   --   ALBUMIN 2.3*  2.3*   < > 2.3*  2.3*   < > 2.5*  2.5*   < > 2.5*  2.5*   < > 2.5* 2.4* 2.4* 2.3* 2.4*   < > = values in this interval not displayed.   No  results for input(s): LIPASE, AMYLASE in the last 168 hours. No results for input(s): AMMONIA in the last 168 hours.  ABG    Component Value Date/Time   PHART 7.420 11/06/2019 0616   PCO2ART 44.2 11/06/2019 0616   PO2ART 182 (H) 11/06/2019 0616   HCO3 28.8 (H) 11/06/2019 0616   TCO2 30 11/06/2019 0616   ACIDBASEDEF 1.0 10/28/2019 1841   O2SAT 44.6 11/08/2019 0440     Coagulation Profile: Recent Labs  Lab 11/03/19 0355 11/04/19 0433 11/05/19 0404 11/06/19 0430 11/07/19 0433  INR 1.2 1.3* 1.2 1.2 1.2    Cardiac Enzymes: No results for input(s): CKTOTAL, CKMB, CKMBINDEX, TROPONINI in the last 168 hours.  HbA1C: Hgb A1c MFr Bld  Date/Time Value Ref Range Status  10/19/2019 12:30 PM 7.3 (H) 4.8 - 5.6 % Final    Comment:    (NOTE) Pre diabetes:          5.7%-6.4% Diabetes:              >6.4% Glycemic control for   <7.0% adults with diabetes     CBG: Recent Labs  Lab 11/07/19 2021 11/07/19 2330 11/08/19 0457 11/08/19 0811 11/08/19 1148  GLUCAP 119* 102* 95 113* 102*

## 2019-11-10 DIAGNOSIS — I50813 Acute on chronic right heart failure: Secondary | ICD-10-CM | POA: Diagnosis not present

## 2019-11-10 DIAGNOSIS — Z515 Encounter for palliative care: Secondary | ICD-10-CM

## 2019-11-10 DIAGNOSIS — R092 Respiratory arrest: Secondary | ICD-10-CM | POA: Diagnosis not present

## 2019-11-10 DIAGNOSIS — Z66 Do not resuscitate: Secondary | ICD-10-CM | POA: Diagnosis not present

## 2019-11-10 MED ORDER — ACETAMINOPHEN 650 MG RE SUPP: 650.0000 mg | Freq: Four times a day (QID) | RECTAL | Status: DC | PRN

## 2019-11-10 MED ORDER — DEXTROSE 5 % IV SOLN: INTRAVENOUS | Status: DC

## 2019-11-10 MED ORDER — ACETAMINOPHEN 325 MG PO TABS: 650.0000 mg | ORAL_TABLET | Freq: Four times a day (QID) | ORAL | Status: DC | PRN

## 2019-11-10 MED ORDER — FENTANYL BOLUS VIA INFUSION
50.0000 ug | INTRAVENOUS | Status: DC | PRN
  Filled 2019-11-10: qty 50

## 2019-11-10 MED ORDER — DIPHENHYDRAMINE HCL 50 MG/ML IJ SOLN: 25.0000 mg | INTRAMUSCULAR | Status: DC | PRN

## 2019-11-14 NOTE — Death Summary Note (Addendum)
DEATH SUMMARY   Patient Details  Name: [name] MRN: [id number] DOB: [birth year]  Admission/Discharge Information   Admit Date:  10/28/2019  Date of Death: Date of Death: November 11, 2019  Time of Death: Time of Death: 18  Length of Stay: October 04, 2022  Referring Physician: [name], MD   Reason(s) for Hospitalization  Elective repair or tricuspid valve.   Diagnoses  Preliminary cause of death:    Severe anoxic brain injury.  Secondary Diagnoses (including complications and co-morbidities):    Profound coagulopathy   Post-cardiomyotomy hemorrhagic shock   Acute respiratory failure with hypoxia (HCC)   Lupus (HCC)   Obesity   Pulmonary hypertension (HCC)   Tricuspid regurgitation   S/P minimally invasive tricuspid valve repair   Acute on chronic right heart failure (HCC)   Cardiorespiratory failure ([place])   Status epilepticus (HCC)   Palliative care encounter   LVAD (left ventricular assist device) present ([place])   DNR (do not resuscitate)   Pressure injury of skin   Hypoxic encephalopathy ([place])   Goals of care, counseling/discussion   Terminal care   Comfort measures only status   Brief Hospital Course (including significant findings, care, treatment, and services provided and events leading to death)  Patient is a 49 year old African-American female with longstanding history of lupus, hypertension, lupus nephritis, 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   Patient has a long history of lupus dating back more than 20 years.  The patient was on prednisone for many years but eventually successfully weaned off after she had problems with avascular necrosis involving both hips and both shoulders as well as weight gain and type 2 diabetes mellitus.  For the most part she has been off of  steroids for the past 2 and half to 3 years although she required a brief steroid taper on 1 occasion last fall.  She is currently immunosuppressed using Plaquenil and cyclophosphamide.  She was first diagnosed with congestive heart failure and fluid retention approximately 1 and half years ago.  She was referred to [name] in the advanced heart failure clinic who has been following her ever since.  Transthoracic echocardiogram performed December 2019 revealed normal left ventricular size and systolic function with mild mitral regurgitation, severe right atrial enlargement, severe tricuspid regurgitation, and peak pulmonary artery pressures estimated 68 mmHg.  Right and left heart cath was performed demonstrating normal coronary arteries with mild pulmonary hypertension and normal PVR.  Follow-up echocardiogram February 2020 revealed normal left ventricular systolic function with mild right ventricular chamber enlargement.  The septum appears flat and there remains severe tricuspid regurgitation.  RV systolic pressures were estimated 86 mmHg.  A bubble study was negative.  Right heart catheterization was repeated June 2020 but there remained only mild pulmonary hypertension.  High resolution chest CT and V/Q studies were performed and notable for the absence of interstitial lung disease pulmonary embolism but findings were consistent with pulmonary hypertension.  Cardiac MRI revealed moderate right ventricular enlargement with right ventricular ejection fraction decreased to 40%.  There was normal left ventricular size and function.  There was severe tricuspid regurgitation very mild mitral regurgitation.  There is no sign of atrial septal defect, patent foramen ovale, nor ventricular septal defect.  Patient recently underwent transesophageal echocardiogram which revealed normal left ventricular size and systolic function.  There was mildly reduced right ventricular systolic function and  the right ventricle  did not appear severely dilated.  There was moderate left atrial enlargement with severe right atrial enlargement.  There was trivial mitral regurgitation.  There was severe tricuspid regurgitation.  The aortic valve appeared normal.  There was severely elevated pulmonary artery systolic pressure.  Cardiothoracic surgical consultation was requested.  Patient underwent tricuspid valve repair via right mini thoracotomy approach on 11/02/2019.  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  Patient developed severe thrombocytopenia and coagulopathy treated with transfusion of platelets, FFP and cryoprecipitate. She underwent re exploration of right chest for bleeding. She had sudden VF arrest at the beginning of this procedure and severe coagulopathy refractory to massive resuscitation with blood products. Ultimately, she was placed on ECMO for acute hypoxic respiratory failure and acute on chronic RV failure.  Patient was resuscitated in the OR throughout the early morning of 10/19/2019 and remained severely coagulopathic.  Eventually her mediastinum was packed and covered with Esmark dressing and the patient brought to the ICU for further resuscitation.  Over the next 6-7 hours the patient's bleeding persisted despite aggressive resuscitation with blood products and all other pharmacologic means possible to correct her coagulopathy. The patient was taken back to the operating room on the afternoon of 11/08/2019 for reexploration for bleeding and further resuscitation.  The patient's coagulopathy appeared to improve and the patient was returned to the ICU on ECMO support in stable but critical condition.  On 10/17/2019 patient was taken back to the operating room for mediastinal wash-out and re-exploration of right chest for evacuation of massive right hemothorax noted on morning CXR.   Intraoperative TEE revealed improved cardiac function while ECMO flows were temporarily weaned with low normal LV function and improved RV function with intact tricuspid valve repair.  On October 24, 2019 the patient began to develop seizure activity for which she was evaluated by the neurology team.  CT scan of the head revealed no acute abnormality.  Seizures were treated with intravenous Keppra, high-dose midazolam, and propofol infusions.  The patient was diuresed aggressively for more than 48 hours.  On October 26, 2019 the patient was taken back to the operating room for mediastinal washout, separation from ECMO support, ECMO decannulation, and delayed primary closure of the sternum.  Patient developed rapid atrial fibrillation for which she was treated with intravenous amiodarone.  Recurrente seizure activity was noted on 10/27/2019 and phenobarbital was started by the neurology team.  Patient developed acute oliguric renal failure and was started on CRRT on 10/28/2019.  Over the following week the patient remained clinically stable but experienced no improvement in neurologic function.  Respiratory and hemodynamic status remained stable despite weaning from nitric oxide and inotropic support.  CRRT was well tolerated and volume excess corrected with removal of more than 17 liters fluid and return of weight towards preop baseline.  Despite holding all forms of sedation the patient remained comatose on the ventilator.  She was followed carefully by the Neurology team who felt the patient had suffered "significant irreversible neurologic damage and will not likely have meaningful neurologic recovery."  After extensive counseling with the Critical Care, Neurology and Palliative Care Teams the patient was made DNR at the family's request on 11/02/2019.  Multiple conversations with the family were organized by the Palliative Care Team.  CRRT was stopped and comfort care measures established on 11/08/2019.  Following  consultation with the patient's family the patient  was extubated on Dec 03, 2019 and passed away peacefully.      Pertinent Labs and Studies  Significant Diagnostic Studies EEG  Result Date: 10/24/2019 Lora Havens, MD     10/25/2019  9:44 AM Patient Name: [name] MRN: [id number] Epilepsy Attending: Lora Havens Referring Physician/Provider: Dr Karena Addison Aroor Date: 10/24/2019 Duration: 25.31 mins Patient history: 49 y.o. 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 Neurology consulted for right facial twitching noted. EEG to evaluate for seizure Level of alertness: comatose AEDs during EEG study: None Technical aspects: This EEG study was done with scalp electrodes positioned according to the 10-20 International system of electrode placement. Electrical activity was acquired at a sampling rate of 500Hz  and reviewed with a high frequency filter of 70Hz  and a low frequency filter of 1Hz . EEG data were recorded continuously and digitally stored. DESCRIPTION:  At the beginning of study patient was noted to have right face twitching. Concomitant eeg showed periodic epileptiform discharges at 2.5-3Hz  in left frontocentral region. Continuous generalized 2-3hz  delta slowing was also noted. Hyperventilation and photic stimulation were not performed. ABNORMALITY - Convulsive status epilepticus, Left frontocentral region - Continuous slow, generalized IMPRESSION: This study showed evidence of focal convulsive status epilepticus arising from left frontocentral region with right facial twitching. Additionally, there is evidence of severe diffuse encephalopathy, non specific to etiology. Dr Aroor was immediately notifed. Priyanka Barbra Sarks   CT HEAD WO CONTRAST  Result Date: 10/29/2019 CLINICAL DATA:  Seizure, abnormal neuro exam,  suspected anoxic brain injury. EXAM: CT HEAD WITHOUT CONTRAST TECHNIQUE: Contiguous axial images were obtained from the base of the skull through the vertex without intravenous contrast. COMPARISON:  Head CT 10/24/2019 FINDINGS: Brain: Evaluation limited by streak and beam hardening artifact arising from scalp monitoring leads. There is no evidence of acute intracranial hemorrhage, intracranial mass, midline shift or extra-axial fluid collection.No demarcated cortical infarction. Apparent asymmetric hypodensity within the right cerebellar hemisphere (series 3, image 8) (series 5, image 46). 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 Vascular: No hyperdense vessel. Skull: Normal. Negative for fracture or focal lesion. Sinuses/Orbits: Visualized orbits demonstrate no acute abnormality. Paranasal sinus mucosal thickening greatest within the bilateral ethmoid and sphenoid sinuses. Large bilateral mastoid effusions. Associated left middle ear effusion. IMPRESSION: Evaluation limited by streak and beam hardening artifact arising from scalp monitoring leads. Apparent asymmetric hypodensity within the right cerebellar hemisphere may reflect asymmetric beam hardening artifact. Edema related to infarct or hypoxic/ischemic injury cannot be excluded. Consider brain MRI for further evaluation, as clinically warranted. Paranasal sinus mucosal thickening. Large bilateral mastoid effusions. Associated left middle ear effusion. Electronically Signed   By: Kellie Simmering DO   On: 10/29/2019 13:44   CT HEAD WO CONTRAST  Result Date: 10/24/2019 CLINICAL DATA:  Possible seizure EXAM: CT HEAD WITHOUT CONTRAST TECHNIQUE: Contiguous axial images were obtained from the base of the skull through the vertex without intravenous contrast. COMPARISON:  None. FINDINGS: Brain: There is no acute intracranial hemorrhage, mass effect, or edema. Somewhat decreased gray-white differentiation is favored to be on a technical basis  noting artifact throughout. There is no extra-axial fluid collection. Ventricles and sulci are within normal limits in size and configuration. 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 Vascular: No hyperdense vessel or unexpected calcification. Skull: Calvarium is unremarkable. Sinuses/Orbits: Nonspecific paranasal sinus and mastoid opacification. Retained secretions in the visualized pharynx.  Orbits are unremarkable. Other: Right frontotemporal scalp soft tissue swelling extending into the face. Posterior left scalp soft tissue swelling. IMPRESSION: No acute intracranial abnormality. Electronically Signed   By: Macy Mis M.D.   On: 10/24/2019 10:33    Overnight EEG with video  Result Date: 10/25/2019 Lora Havens, MD     10/19/2019  9:03 AM Patient Name: [name] MRN: [id number] Epilepsy Attending: Lora Havens Referring Physician/Provider: Dr Zeb Comfort Duration: 10/24/2019 1158 to 10/25/2019 1158 Patient history: 49 y.o. 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 Neurology consulted for right facial twitching noted. EEG to evaluate for seizure   Level of alertness: comatose   AEDs during EEG study: keppra, vimpat, propofol   Technical aspects: This EEG study was done with scalp electrodes positioned according to the 10-20 International system of electrode placement. Electrical activity was acquired at a sampling rate of 500Hz  and reviewed with a high frequency filter of 70Hz  and a low frequency filter of 1Hz . EEG data were recorded continuously and digitally stored.   DESCRIPTION:  At the beginning, eeg showed periodic epileptiform discharges at 2.5-3Hz  in left frontocentral region. After around 1230 on 10/24/2019, periodic epileptiform discharges appeared at 1.5-2Hz .  After around 1330, eeg continued to  improve and showed intermittent sharp waves in left frontocentral region. Again after 1800, eeg showed periodic epileptiform discharges at 1.5-2hz .As AEDs were titrated, EEG gradually showed intermittent generalized low amplitude 2-5hz  theta-delta slowing for 2-3 seconds alternating with high amplitude sharply contoured 2-5hz  theta-delta slowing as well as sharp waves in left frontocentral region. Around 0700 on 10/25/2019. eeg again showed generalized periodic epileptiform discharges with overriding fast activity at 2-2.5hz  which again gradually improved around 930am and showed  intermittent generalized low amplitude 2-5hz  theta-delta slowing for 2-3 seconds alternating with high amplitude sharply contoured 2-5hz  theta-delta slowing as well as sharp waves in left frontocentral region.. Hyperventilation and photic stimulation were not performed.   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 As AED were titrated, eeg improved and showed severe diffuse encephalopathy, non specific to etiology. However, since around 7am on 10/25/2019, eeg appears to be worsening again with generalized periodic epileptiform discharges with overriding fast activity at 2-2.5Hz  consistent with ictal-interictal continuum. Dr Aroor was intermittently notified. Lora Havens      Lab Basic Metabolic Panel: Recent Labs  Lab 11/06/19 0430 11/06/19 7829 11/06/19 0746 11/06/19 1620 11/07/19 0433 11/07/19 0434 11/07/19 1721 11/08/19 0442  NA 134*   < > 133* 132*  --  130* 133* 129*  K 4.3   < > 4.4 4.3  --  4.1 4.3 4.0  CL 99   < > 100 100  --  97* 99 96*  CO2 24   < > 25 24  --  26 25 26   GLUCOSE 102*   < > 120* 115*  --  371* 123* 432*  BUN 45*   < > 42* 41*  --  36* 37* 29*  CREATININE 2.04*    < > 2.01* 1.75*  --  1.59* 1.79* 1.55*  CALCIUM 8.6*   < > 8.2* 8.3*  --  8.1* 8.3* 7.9*  MG 2.6*  --  2.6*  --  2.5*  --   --  2.4  PHOS 3.2  --   --  2.6  --  2.2* 4.8* 2.5   < > = values in this interval not displayed.   Liver Function Tests: Recent Labs  Lab 11/06/19 0430 11/06/19 0430 11/06/19 1620 11/07/19 0433 11/07/19 0434 11/07/19 1721 11/08/19 0442  AST 53*  --   --  38  --   --   --   ALT 27  --   --  23  --   --   --   ALKPHOS 187*  --   --  177*  --   --   --   BILITOT 1.8*  --   --  1.5*  --   --   --   PROT 7.3  --   --  6.7  --   --   --   ALBUMIN 2.5*  2.5*   < > 2.5* 2.4* 2.4* 2.3* 2.4*   < > = values in this interval not displayed.   No results for input(s): LIPASE, AMYLASE in the last 168 hours. No results for input(s): AMMONIA in the last 168 hours. CBC: Recent Labs  Lab 11/05/19 1905 11/06/19 0616 11/06/19 0746  WBC  --   --  9.3  NEUTROABS  --   --  7.7  HGB 9.9* 9.9* 8.2*  HCT 29.0* 29.0* 24.1*  MCV  --   --  84.0  PLT  --   --  88*   Cardiac Enzymes: No results for input(s): CKTOTAL, CKMB, CKMBINDEX, TROPONINI in the last 168 hours. Sepsis Labs: Recent Labs  Lab 11/06/19 0746  WBC 9.3    Procedures/Operations   CARDIOTHORACIC SURGERY OPERATIVE NOTE  Date of Procedure: 10/20/2019  Preoperative Diagnosis: Severe Tricuspid Regurgitation  Postoperative Diagnosis: Same  Procedure:  Minimally-Invasive Tricuspid Valve Repair Complex valvuloplasty including autologous pericardial patch augmentation  Suture plication of commissures  [employer] Annuloplasty (size 9mm, model # N8169330, serial # [id number])  Surgeon: Valentina Gu. 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  Type I and type IIIA valve dysfunction with severe tricuspid regurgitation  Normal left ventricular systolic function    Mild-moderate right ventricular chamber enlargement with mild RV dysfunction  Mild-moderate (1+/2+) residual tricuspid regurgitation after valve repair  Severe thrombocytopenia and coagulapathy after reversal of heparin    BRIEF CLINICAL NOTE AND INDICATIONS FOR SURGERY  Patient is a 49 year old African-American female with longstanding history of lupus, hypertension, lupus nephritis, 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  Patient has a long history of lupus dating back more than 20 years. The patient was on prednisone for many years but eventually successfully weaned off after she had problems with avascular necrosis involving both hips and both shoulders as well as weight gain and type 2 diabetes mellitus. For the most part she has been off of steroids for the past 2 and half to 3 years although she required a brief steroid taper on 1 occasion last fall. She is currently immunosuppressed using Plaquenil and cyclophosphamide. She was first diagnosed with congestive heart failure and fluid retention approximately 1 and half years ago. She was referred to [name] in the advanced heart failure clinic who has been following her ever since.  Transthoracic echocardiogram performed December 2019 revealed normal left ventricular size and systolic function with mild mitral regurgitation, severe right atrial enlargement, severe tricuspid regurgitation, and peak pulmonary artery pressures estimated 68 mmHg. Right and left heart cath was performed demonstrating normal coronary arteries with mild pulmonary hypertension and normal PVR. Follow-up echocardiogram February 2020 revealed normal left ventricular systolic function with mild right ventricular chamber enlargement. The septum appears flat and there remains  severe tricuspid regurgitation. RV systolic pressures were estimated 86 mmHg. A bubble study was negative. Right heart catheterization was repeated June 2020 but there remained only mild pulmonary hypertension. High resolution chest CT and V/Q studies were performed and notable for the absence of interstitial lung disease pulmonary embolism but findings were consistent with pulmonary hypertension. Cardiac MRI revealed moderate right ventricular enlargement with right ventricular ejection fraction decreased to 40%. There was normal left ventricular size and function. There was severe tricuspid regurgitation very mild mitral regurgitation. There is no sign of atrial septal defect, patent foramen ovale, nor ventricular septal defect. Patient recently underwent transesophageal echocardiogram which revealed normal left ventricular size and systolic function. There was mildly reduced right ventricular systolic function and the right ventricle did not appear severely dilated. There was moderate left atrial enlargement with severe right atrial enlargement. There was trivial mitral regurgitation. There was severe tricuspid regurgitation. The aortic valve appeared normal. There was severely elevated pulmonary artery systolic pressure. Cardiothoracic surgical consultation was requested.  The patient has been seen in consultation and counseled at length regarding the indications, risks and potential benefits of surgery. All questions have been answered, and the patient provides full informed consent for the operation as described.    CARDIOTHORACIC SURGERY OPERATIVE NOTE  Date of Procedure: 10/30/2019  Preoperative Diagnosis:  Bleeding s/p Minimally-Invasive Tricuspid Valve Repair Postoperative Diagnosis:  Bleeding s/p Minimally-Invasive Tricuspid Valve Repair  S/P Ventricular Fibrillation Cardiac Arrest  Massive Coagulopathy  Acute Hypoxic Respiratory Failure  Acute on Chronic Right Ventricular Failure Procedure:    Reexploration of Right Chest for Bleeding  Median Sternotomy for Resuscitation Following Cardiac Arrest  Cannulation for Extracorporeal Membrane Oxygenator Support Surgeon: Valentina Gu. Roxy Manns, MD  Assistant: B. Murvin Natal, MD and Ivin Poot, MD  Anesthesia: Belinda Block, MD and Roberts Gaudy, MD  Operative Findings:  Patient suffered sudden ventricular fibrillation cardiac arrest at beginning of procedure  Prolonged period of closed chest followed by emergency sternotomy for open chest cardiac massage  Severe coagulopathy refractory to massive resuscitation with blood products  Acute hypoxic respiratory failure and acute on chronic right ventricular failure requiring initiation of ECMO support     BRIEF CLINICAL NOTE AND INDICATIONS FOR SURGERY  Patient is a 49 year old African-American female with complex past medical history notable for history of longstanding lupus, hypertension, lupus nephritis, pulmonary hypertension, severe tricuspid regurgitation, 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 Intraoperatively the patient was noted to have severe thrombocytopenia and coagulopathy that was treated with administration of platelets and fresh frozen plasma. During the initial 3 hours after return from the operating room to the intensive care unit patient remained hemodynamically stable but had more than 600 mL of chest tube output and portable chest x-ray suggested early signs of possible developing hemothorax. Plans were made to return directly to the operating room for reexploration of the right chest. The patient's husband was appraised of the circumstances at  the patient's bedside.  CARDIOTHORACIC SURGERY OPERATIVE NOTE  Date of Procedure: 10/25/2019  Preoperative Diagnosis: Bleeding s/p Median Sternotomy and Cannulation for ECMO  Support  Postoperative Diagnosis: same  Procedure: Reexploration of Mediastinum for Bleeding  Surgeon: Valentina Gu. Roxy Manns, MD  Assistant: Enid Cutter, PA-C  Anesthesia: Roberts Gaudy, MD  Operative Findings:  Extensive mediastinal blood and clot but relatively minimal active bleeding  Improved coagulopathy    CARDIOTHORACIC SURGERY OPERATIVE NOTE  Date of Procedure: 10/17/2019  Preoperative Diagnosis: Right Hemothorax on ECMO Support  Postoperative Diagnosis: same  Procedure:  Mediastinal Wash-out  Re-exploration of Right Chest for Evacuation of Hemothorax Surgeon: Valentina Gu. Roxy Manns, MD  Assistant: Ivin Poot, MD  Anesthesia: Midge Minium, MD  Operative Findings:  Massive right hemothorax with no obvious active bleeding  Stable hemodynamics with low normal LV function and improved RV function    Antony Odea, PA-C 11/12/2019, 3:40 PM   Rexene Alberts, MD 11/12/2019 6:12 PM

## 2019-11-14 NOTE — Progress Notes (Addendum)
Daily Progress Note   Patient Name: [name]       Date: 20-Nov-2019 DOB: August 25, 1970  Age: 49 y.o. MRN#: [id number] Attending Physician: Rexene Alberts, MD Primary Care Physician: [name], MD Admit Date: 11/01/2019  Reason for Consultation/Follow-up: Establishing goals of care  Subjective: Received call from Cybil, RN that patient had significant EKG with widening complex, pupils now fixed. On my evaluation patient was in agonal respirations despite full vent support, non responsive, some intermittent biting of tube. Spouse at bedside, states he is ready for extubation to full comfort, understands that [name] will likely live only seconds to minutes.   Review of Systems  Unable to perform ROS: Patient unresponsive    Length of Stay: 20  Current Medications: Scheduled Meds:  . chlorhexidine gluconate (MEDLINE KIT)  15 mL Mouth Rinse BID  . Chlorhexidine Gluconate Cloth  6 each Topical Daily  . mouth rinse  15 mL Mouth Rinse 10 times per day  . PHENObarbital  160 mg Intravenous BID  . sodium chloride flush  10-40 mL Intracatheter Q12H    Continuous Infusions: . dextrose    . fentaNYL infusion INTRAVENOUS 20 mcg/hr (11/20/19 1200)  . lacosamide (VIMPAT) IV Stopped (11/20/2019 1123)  . levETIRAcetam 1,000 mg (11/20/2019 1226)    PRN Meds: acetaminophen **OR** acetaminophen, antiseptic oral rinse, artificial tears, diphenhydrAMINE, fentaNYL, glycopyrrolate **OR** glycopyrrolate **OR** glycopyrrolate, haloperidol **OR** haloperidol **OR** haloperidol lactate, LORazepam, ondansetron **OR** ondansetron (ZOFRAN) IV, sodium chloride flush  Physical Exam Vitals and nursing note reviewed.  Constitutional:      Comments: cachectic  Cardiovascular:     Rate and Rhythm:  Tachycardia present. Rhythm irregular.  Pulmonary:     Comments: Agonal respirations Neurological:     Comments: Nonresponsive to all stimuli             Vital Signs: BP 109/62   Pulse (!) 169   Temp 97.7 F (36.5 C)   Resp (!) 23   Ht _0  (1.702 m)   Wt 66.9 kg   LMP 04/02/2017   SpO2 93%   BMI 23.10 kg/m  SpO2: SpO2: 93 % O2 Device: O2 Device: Ventilator O2 Flow Rate: O2 Flow Rate (L/min): 60 L/min  Intake/output summary:   Intake/Output Summary (Last 24 hours) at 11/20/19 1401 Last data filed at 11-20-19 1200  Gross per 24 hour  Intake 205.18 ml  Output --  Net 205.18 ml   LBM: Last BM Date: 11/09/19 Baseline Weight: Weight: 71 kg Most recent weight: Weight: 66.9 kg       Palliative Assessment/Data: PPS: 10    Flowsheet Rows     Most Recent Value  Intake Tab  Referral Department  Surgery  Unit at Time of Referral  ICU  Palliative Care Primary Diagnosis  Cardiac  Date Notified  11/02/2019  Palliative Care Type  New Palliative care  Reason for referral  Other (Comment) [ECMO]  Date of Admission  10/18/2019  Date first seen by Palliative Care  10/25/19  # of days Palliative referral response time  2 Day(s)  # of days IP prior to Palliative referral  2  Clinical Assessment  Psychosocial & Spiritual Assessment  Palliative Care Outcomes      Patient Active Problem List   Diagnosis Date Noted  . Terminal care   . Goals of care, counseling/discussion   . Pressure injury of skin 11/05/2019  . Hypoxic encephalopathy ([place])   . DNR (do not resuscitate)   . LVAD (left ventricular assist device) present ([place])   . Cardiorespiratory failure ([place])   . Status epilepticus ([place])   . Palliative care encounter   . Acute respiratory failure with hypoxia ([place])   . Acute on chronic right heart failure ([place])   . Coagulopathy ([place])   . 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 Pulmonary hypertension ([place])   . Tricuspid regurgitation   . Menopause  05/16/2018  . Vitamin D deficiency 05/16/2018  . Peripheral edema 05/15/2018  . Obesity 09/11/2016  . Upper airway cough syndrome 10/14/2013  . 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 She made another trip back to the OR for evacuation of hemothorax. She subsequently developed status epilepticus. After treatment with dual antiseizure medications and sedation the status stopped and she has been having intermittent seizing episodes since. There is some concern for a degree of anoxic brain injury. She is currently slated for ECMO decannulation on 4/12 am. Assessment/Recommendations/Plan   Patient actively dying, agonal respirations on vent  I remained at bedside as patient was extubated to room air and full comfort measures only in order to manage medications and ensure comfort, she died within minutes of extubation without any signs of discomfort  Patient's spouse remained in waiting room- I notified him of patient's death- other family members to bedside after extubation   Code Status:  DNR  Care plan was discussed with patient's nurse, spouse and parents-in-law.  Thank you for allowing the Palliative Medicine Team to assist in the care of this patient.   Time In: 1300 Time Out: 1350 Total Time 50 minutes Prolonged Time Billed yes      Greater than 50%  of this time was spent counseling and coordinating care related to the above assessment and plan.  Mariana Kaufman, AGNP-C Palliative Medicine   Please contact Palliative Medicine Team phone at  [phone] for questions and concerns.

## 2019-11-14 NOTE — Progress Notes (Signed)
Patient's heart rate has significantly increased since 7am; HR 150-165. Rhythm appears very wide complex. Patient's pupils are also no longer reactive. Patient's husband Herbie Baltimore called and notified of patient's status change. He stated he will be up to see the patient around 2pm. Palliative care NP notified of the above, she stated she will meet him here at that time.  Joellen Jersey, RN

## 2019-11-14 NOTE — Progress Notes (Signed)
Patient ID: [name], female   DOB: 1971/02/04, 49 y.o.   MRN: [id number] P    Advanced Heart Failure Rounding Note  PCP-Cardiologist: No primary care provider on file.   Subjective:    Remains unresponsive. Off sedation since 4/15.   Off CVVHD and all drips except Fentanyl.  Objective:   Weight Range: 66.9 kg Body mass index is 23.1 kg/m.   Vital Signs:   Pulse Rate:  [110-117] 112 (04/27 0500) Resp:  [0-29] 19 (04/27 0725) SpO2:  [97 %-100 %] 99 % (04/27 0725) Arterial Line BP: (75-125)/(47-68) 113/61 (04/27 0600) FiO2 (%):  [40 %] 40 % (04/27 0725) Last BM Date: 11/09/19  Weight change: Filed Weights   11/06/19 0500 11/07/19 0428 11/08/19 0429  Weight: 69.5 kg 66.5 kg 66.9 kg    Intake/Output:   Intake/Output Summary (Last 24 hours) at 11/15/2019 0807 Last data filed at 15-Nov-2019 0600 Gross per 24 hour  Intake 320.19 ml  Output --  Net 320.19 ml      Physical Exam    General:  Terminally ill appearing. Unresponsive.  HEENT: normal +ETT Neck: supple. no JVD. Carotids 2+ bilat; no bruits. No lymphadenopathy or thryomegaly appreciated. Cor: PMI nondisplaced. Regular rate & rhythm. No rubs, gallops or murmurs. Lungs: clear Abdomen: soft, nontender, nondistended. No hepatosplenomegaly. No bruits or masses. Good bowel sounds. Extremities: no cyanosis, clubbing, rash, edema Neuro: alert & orientedx3, cranial nerves grossly intact. moves all 4 extremities w/o difficulty. Affect pleasant   Telemetry   AFL  Rate 110-120 Personally reviewed  Labs    CBC No results for input(s): WBC, NEUTROABS, HGB, HCT, MCV, PLT in the last 72 hours. Basic Metabolic Panel Recent Labs    11/07/19 1721 11/08/19 0442  NA 133* 129*  K 4.3 4.0  CL 99 96*  CO2 25 26  GLUCOSE 123* 432*  BUN 37* 29*  CREATININE 1.79* 1.55*  CALCIUM 8.3* 7.9*  MG  --  2.4  PHOS 4.8* 2.5   Liver Function Tests Recent Labs    11/07/19 1721 11/08/19 0442  ALBUMIN 2.3* 2.4*   No  results for input(s): LIPASE, AMYLASE in the last 72 hours. Cardiac Enzymes No results for input(s): CKTOTAL, CKMB, CKMBINDEX, TROPONINI in the last 72 hours.  BNP: BNP (last 3 results) Recent Labs    04/15/19 1101  BNP 248.7*    ProBNP (last 3 results) No results for input(s): PROBNP in the last 8760 hours.   D-Dimer No results for input(s): DDIMER in the last 72 hours. Hemoglobin A1C No results for input(s): HGBA1C in the last 72 hours. Fasting Lipid Panel No results for input(s): CHOL, HDL, LDLCALC, TRIG, CHOLHDL, LDLDIRECT in the last 72 hours. Thyroid Function Tests No results for input(s): TSH, T4TOTAL, T3FREE, THYROIDAB in the last 72 hours.  Invalid input(s): FREET3  Other results:   Imaging    No results found.   Medications:     Scheduled Medications: . bisacodyl  10 mg Oral Daily   Or  . bisacodyl  10 mg Rectal Daily  . chlorhexidine gluconate (MEDLINE KIT)  15 mL Mouth Rinse BID  . Chlorhexidine Gluconate Cloth  6 each Topical Daily  . mouth rinse  15 mL Mouth Rinse 10 times per day  . PHENObarbital  160 mg Intravenous BID  . sodium chloride flush  10-40 mL Intracatheter Q12H    Infusions: . sodium chloride Stopped (10/27/19 1308)  . fentaNYL infusion INTRAVENOUS 15 mcg/hr (15-Nov-2019 0600)  . lacosamide (VIMPAT) IV  Stopped (11/09/19 2257)  . levETIRAcetam Stopped (Dec 02, 2019 0047)    PRN Medications: sodium chloride, antiseptic oral rinse, artificial tears, fentaNYL, glycopyrrolate **OR** glycopyrrolate **OR** glycopyrrolate, haloperidol **OR** haloperidol **OR** haloperidol lactate, LORazepam, LORazepam, ondansetron **OR** ondansetron (ZOFRAN) IV, sodium chloride flush     Assessment/Plan   1. Post-cardiotomy hemorrhagic shock 2. Acute systolic CHF, primarily RV failure with cardiogenic shock 3. 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  4. Post-operative respiratory failure - remains intubated, FiO2 0.5.   Unable to wean due to Neuro status 5. Convulsive status epilepticus - No longer seizing but has not woken up. Picture c/w severe anoxic brain injury. Potential posturing on exam today per RN  6. AKI  - CVVH off 7. Atrial fibrillation/flutter 8. Goals of care -> DNR - Palliative care is following. - She appears to have severe anoxic brain injury with very poor prognosis.   Not much change overnight. She is actively dying. Palliative Care team very involved and patient DNR but family has been hesitant to make full switch to [place]. At their request, we have stopped all aggressive measures but they would like to keep on vent until 5/1. No labs being drawn. I worry this approach may lead to patient suffering. Low threshold to switch to full comfort care.     Length of Stay: Boyd, MD  2019/12/02, 8:07 AM  Advanced Heart Failure Team Pager [phone] (M-F; 7a - 4p)  Please contact [facility] for night-coverage after hours (4p -7a ) and weekends on [email or web address]

## 2019-11-14 NOTE — Progress Notes (Signed)
      [address]       [place] [address]             [phone]        CARDIOTHORACIC SURGERY PROGRESS NOTE   R15 Days Post-Op Procedure(s) (LRB): MEDIASTERNAL WASH OUT,  ECMO DECANNULATION AND STERNAL CLOSURE (N/A) TRANSESOPHAGEAL ECHOCARDIOGRAM (TEE) (N/A)  Subjective: Unresponsive on vent.  No purposeful movements or response to noxious stimuli  Objective: Vital signs: BP Readings from Last 1 Encounters:  11/09/19 109/62   Pulse Readings from Last 1 Encounters:  Nov 13, 2019 (!) 112   Resp Readings from Last 1 Encounters:  11/13/2019 19   Temp Readings from Last 1 Encounters:  11/08/19 97.7 F (36.5 C)    Hemodynamics:    Physical Exam:  Breath sounds: clear  Heart sounds:  RRR  Incisions:  Clean and dry  Abdomen:  Soft, non-distended  Extremities:  Warm, well-perfused   Intake/Output from previous day: 04/26 0701 - 04/27 0700 In: 362.5 [I.V.:92.5; IV Piggyback:270] Out: -  Intake/Output this shift: No intake/output data recorded.  Lab Results:  CBC:No results for input(s): WBC, HGB, HCT, PLT in the last 72 hours.  BMET:  Recent Labs    11/07/19 1721 11/08/19 0442  NA 133* 129*  K 4.3 4.0  CL 99 96*  CO2 25 26  GLUCOSE 123* 432*  BUN 37* 29*  CREATININE 1.79* 1.55*  CALCIUM 8.3* 7.9*     PT/INR:  No results for input(s): LABPROT, INR in the last 72 hours.  CBG (last 3)  Recent Labs    11/08/19 0457 11/08/19 0811 11/08/19 1148  GLUCAP 95 113* 102*    ABG    Component Value Date/Time   PHART 7.420 11/06/2019 0616   PCO2ART 44.2 11/06/2019 0616   PO2ART 182 (H) 11/06/2019 0616   HCO3 28.8 (H) 11/06/2019 0616   TCO2 30 11/06/2019 0616   ACIDBASEDEF 1.0 10/28/2019 1841   O2SAT 44.6 11/08/2019 0440    CXR: n/a  Assessment/Plan: S/P Procedure(s) (LRB): MEDIASTERNAL WASH OUT,  ECMO DECANNULATION AND STERNAL CLOSURE (N/A) TRANSESOPHAGEAL ECHOCARDIOGRAM (TEE) (N/A)  Goals of care addressed by Palliative Care  Team via meetings with family.   Comfort care measures in place with plans for extubation on 5/1.  No more lab draws or x-rays.   I would favor removing Aline, definitely would not replace it.  I agree w/ Dr Haroldine Laws and remainder of team that full comfort care measures aimed to minimize any risk of suffering are most appropriate at this point.  Rexene Alberts, MD Nov 13, 2019 7:57 AM

## 2019-11-14 NOTE — Progress Notes (Signed)
Late Entry  Patient's husband Herbie Baltimore arrived to [facility] at 1300 after I had called him. Upon seeing the patient he stated "I can't see her like this, I can't do this anymore, let's take the tube out." Palliative care NP was notified and came to speak to Mr. Robert. Terminal extubation orders received. Patient given prn robinul and ativan prior to extubation. Mr. Herbie Baltimore had requested not to be present during extubation or after she passed. Patient without heart or breath sounds less than 5 minutes after extubation. Palliative care team and myself were at the bedside when she passed. Patient's brother and father at bedside afterwards. Elink, CDS, and Dr. Roxy Manns all notified of patient's death.  Joellen Jersey, RN

## 2019-11-14 NOTE — Progress Notes (Signed)
100 mL of fentanyl wasted in steri-container witnessed by Idelia Salm RN.

## 2019-11-14 NOTE — Progress Notes (Signed)
Extubation per order from  Earlie Counts, NP

## 2019-11-14 DEATH — deceased

## 2021-04-29 IMAGING — CR DG CHEST 2V
2 series · 2 of 2 positions shown · non-contrast
Comparison: Chest radiograph 03/20/2019.

CLINICAL DATA: Preop tricuspid valve repair.

EXAM:
CHEST - 2 VIEW

[w chest pa]
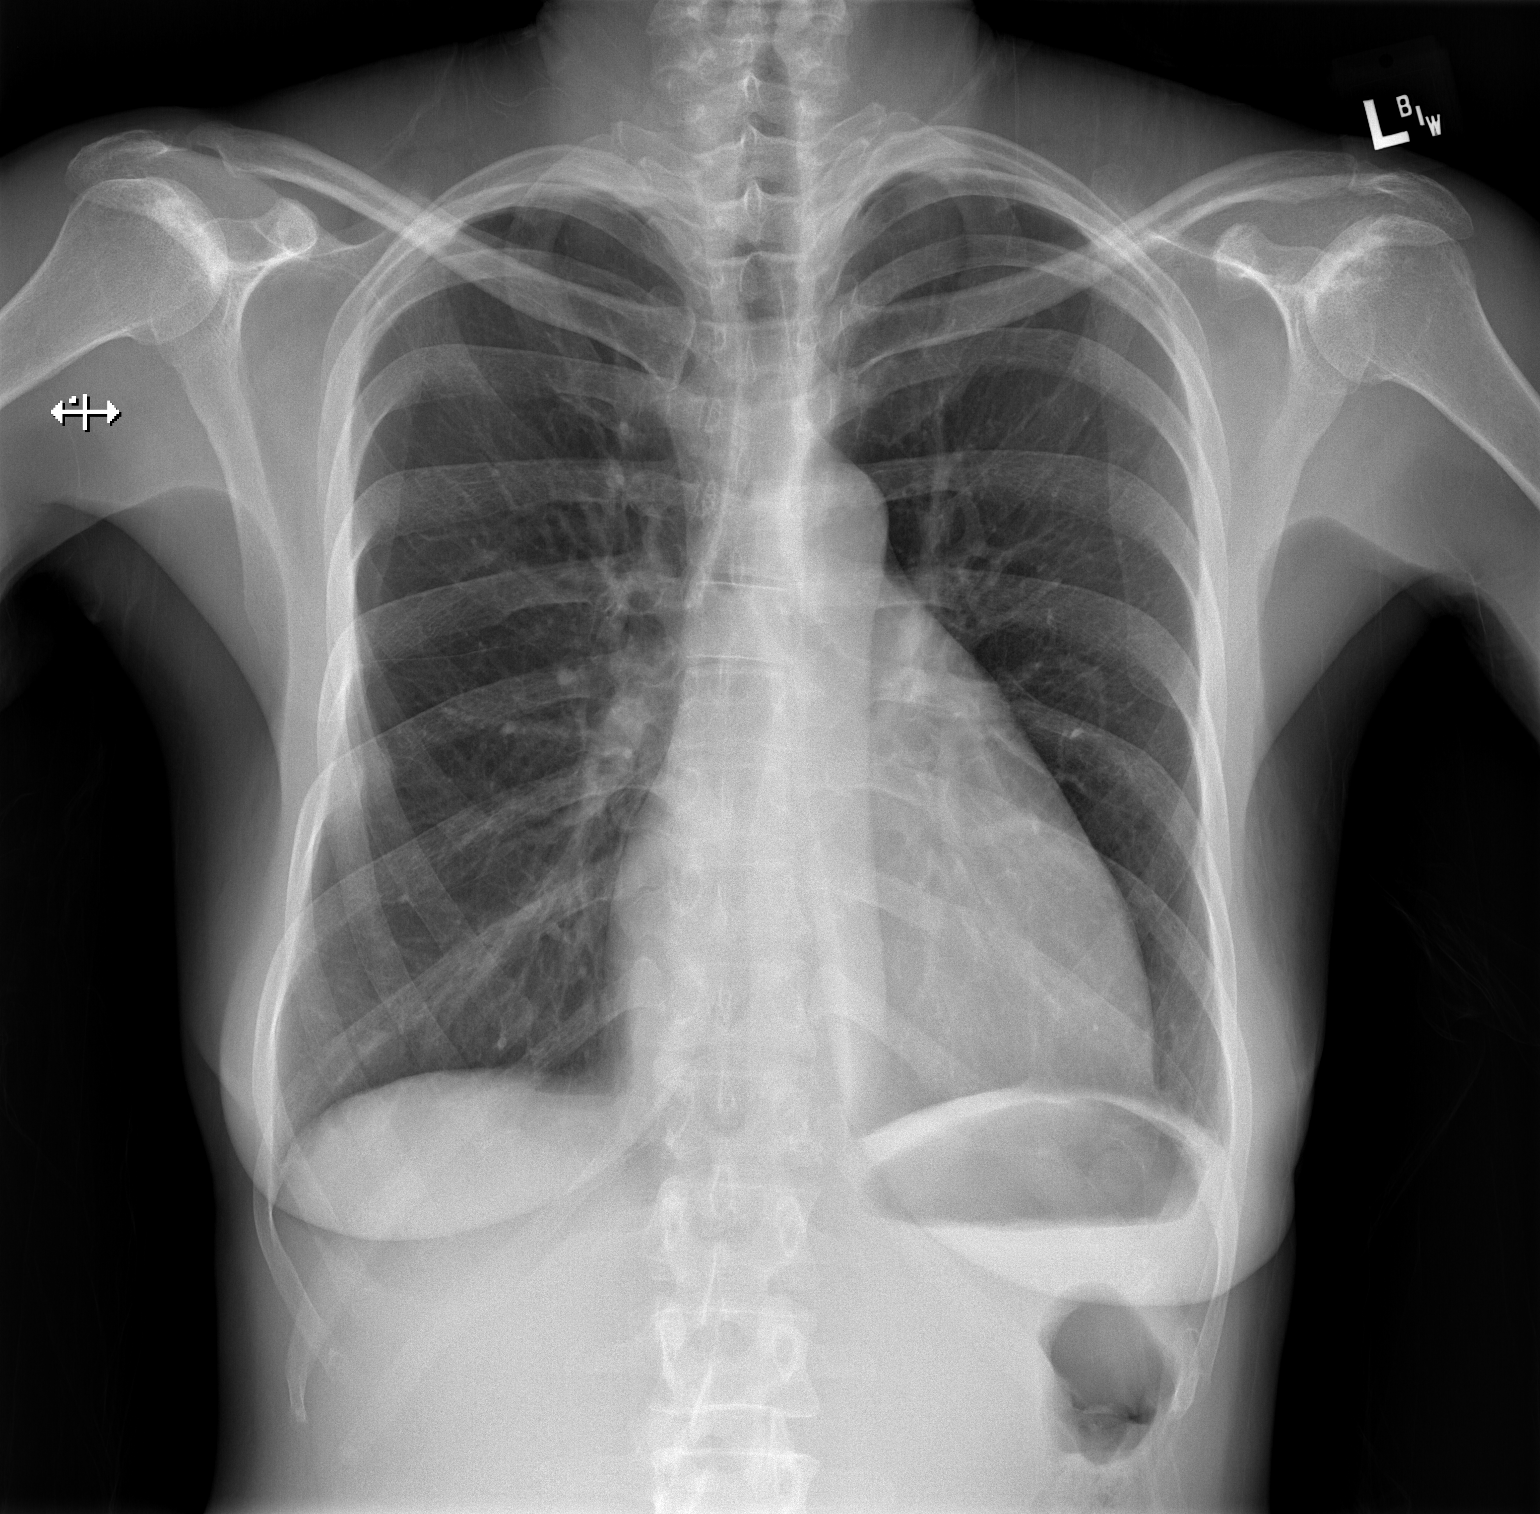

[w chest lat]
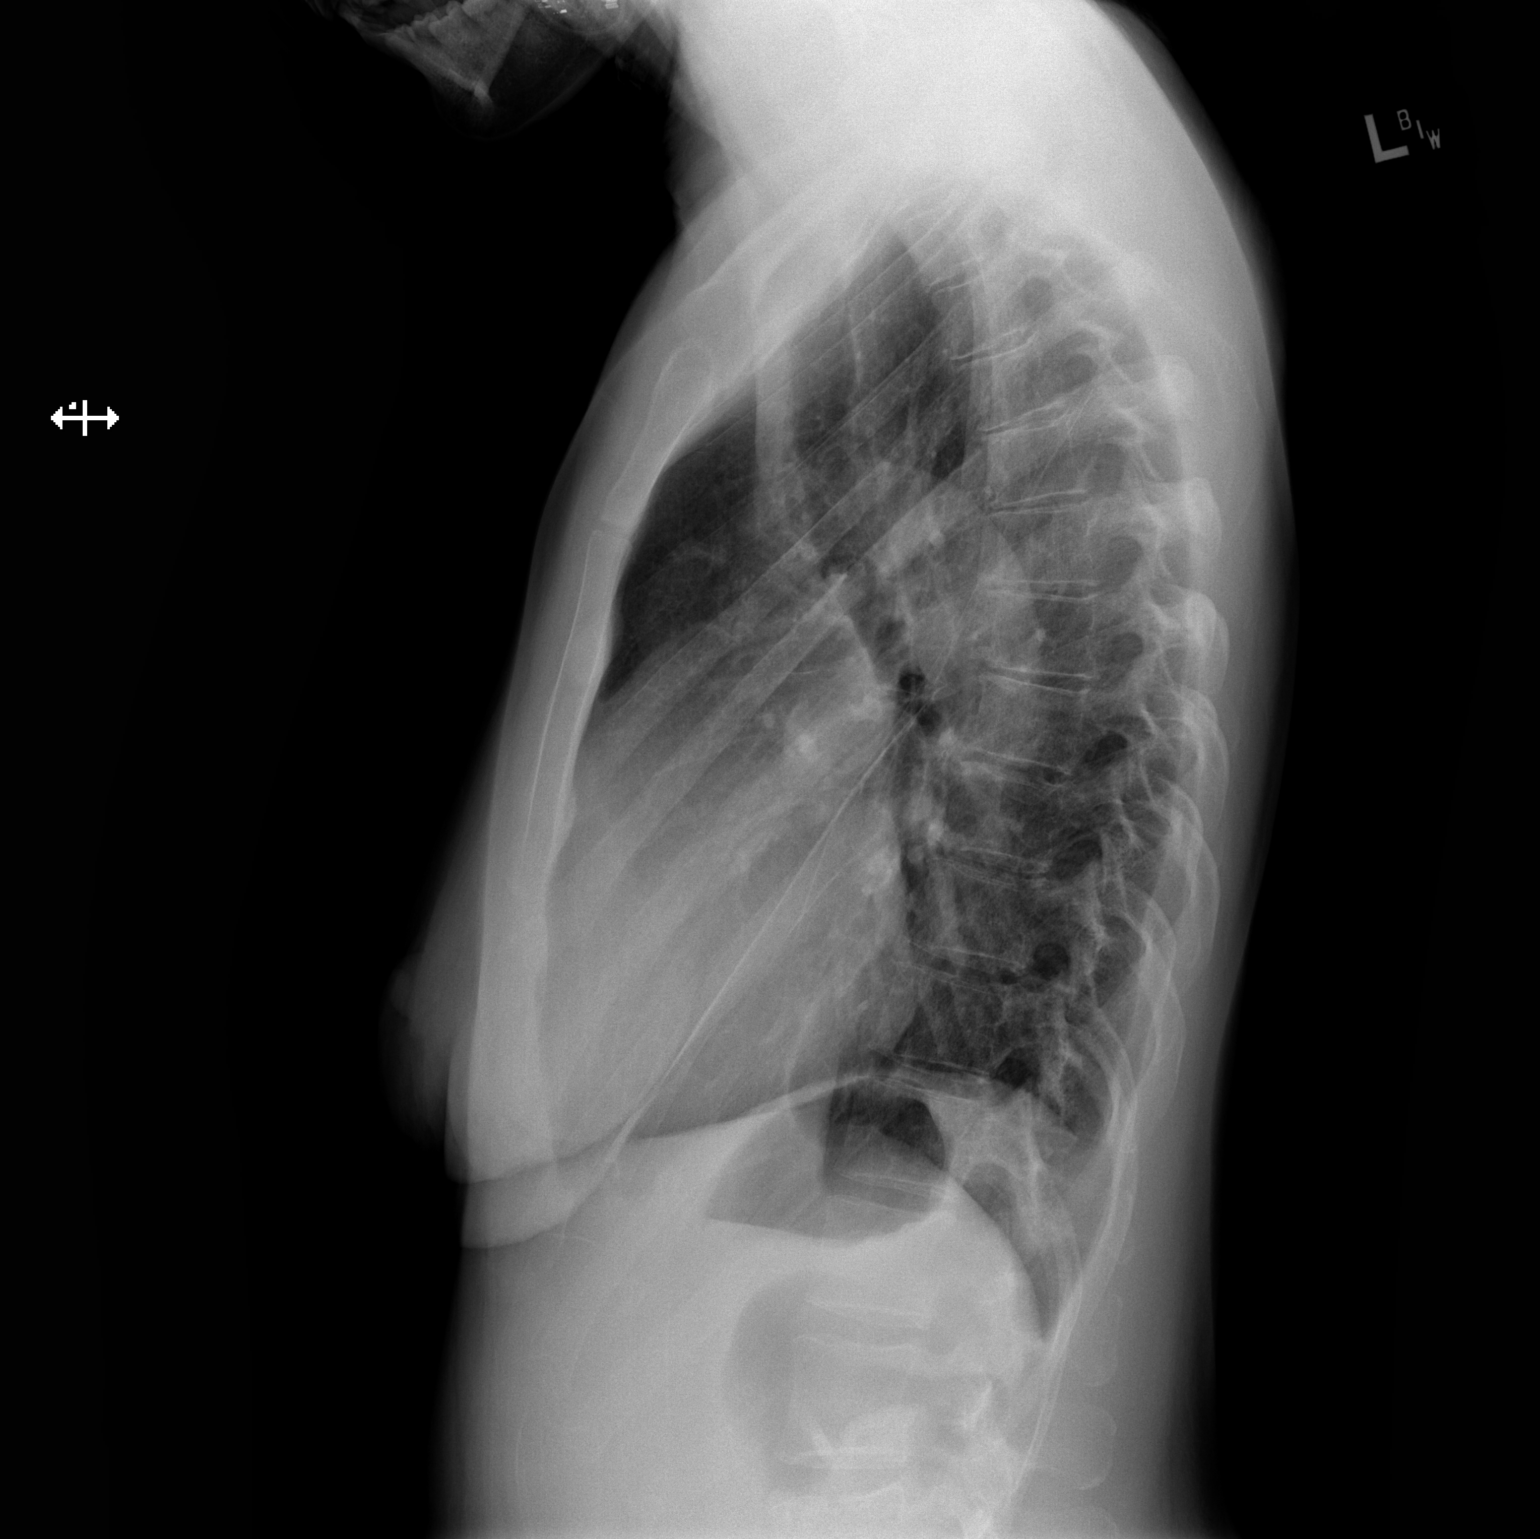

[2 of 2 positions shown; findings below may reference images not displayed]

FINDINGS: Trachea is midline. Heart is enlarged. Lungs may be mildly
hyperinflated but are clear. No pleural fluid. Changes of avascular
necrosis are seen in both humeral heads.
IMPRESSION: No acute findings.

## 2021-05-01 IMAGING — DX DG CHEST 1V PORT
1 series · 1 of 1 positions shown · non-contrast
Comparison: Preoperative radiograph 10/19/2019

CLINICAL DATA: Status post minimally invasive tricuspid valve
repair.

EXAM:
PORTABLE CHEST 1 VIEW

[chest]
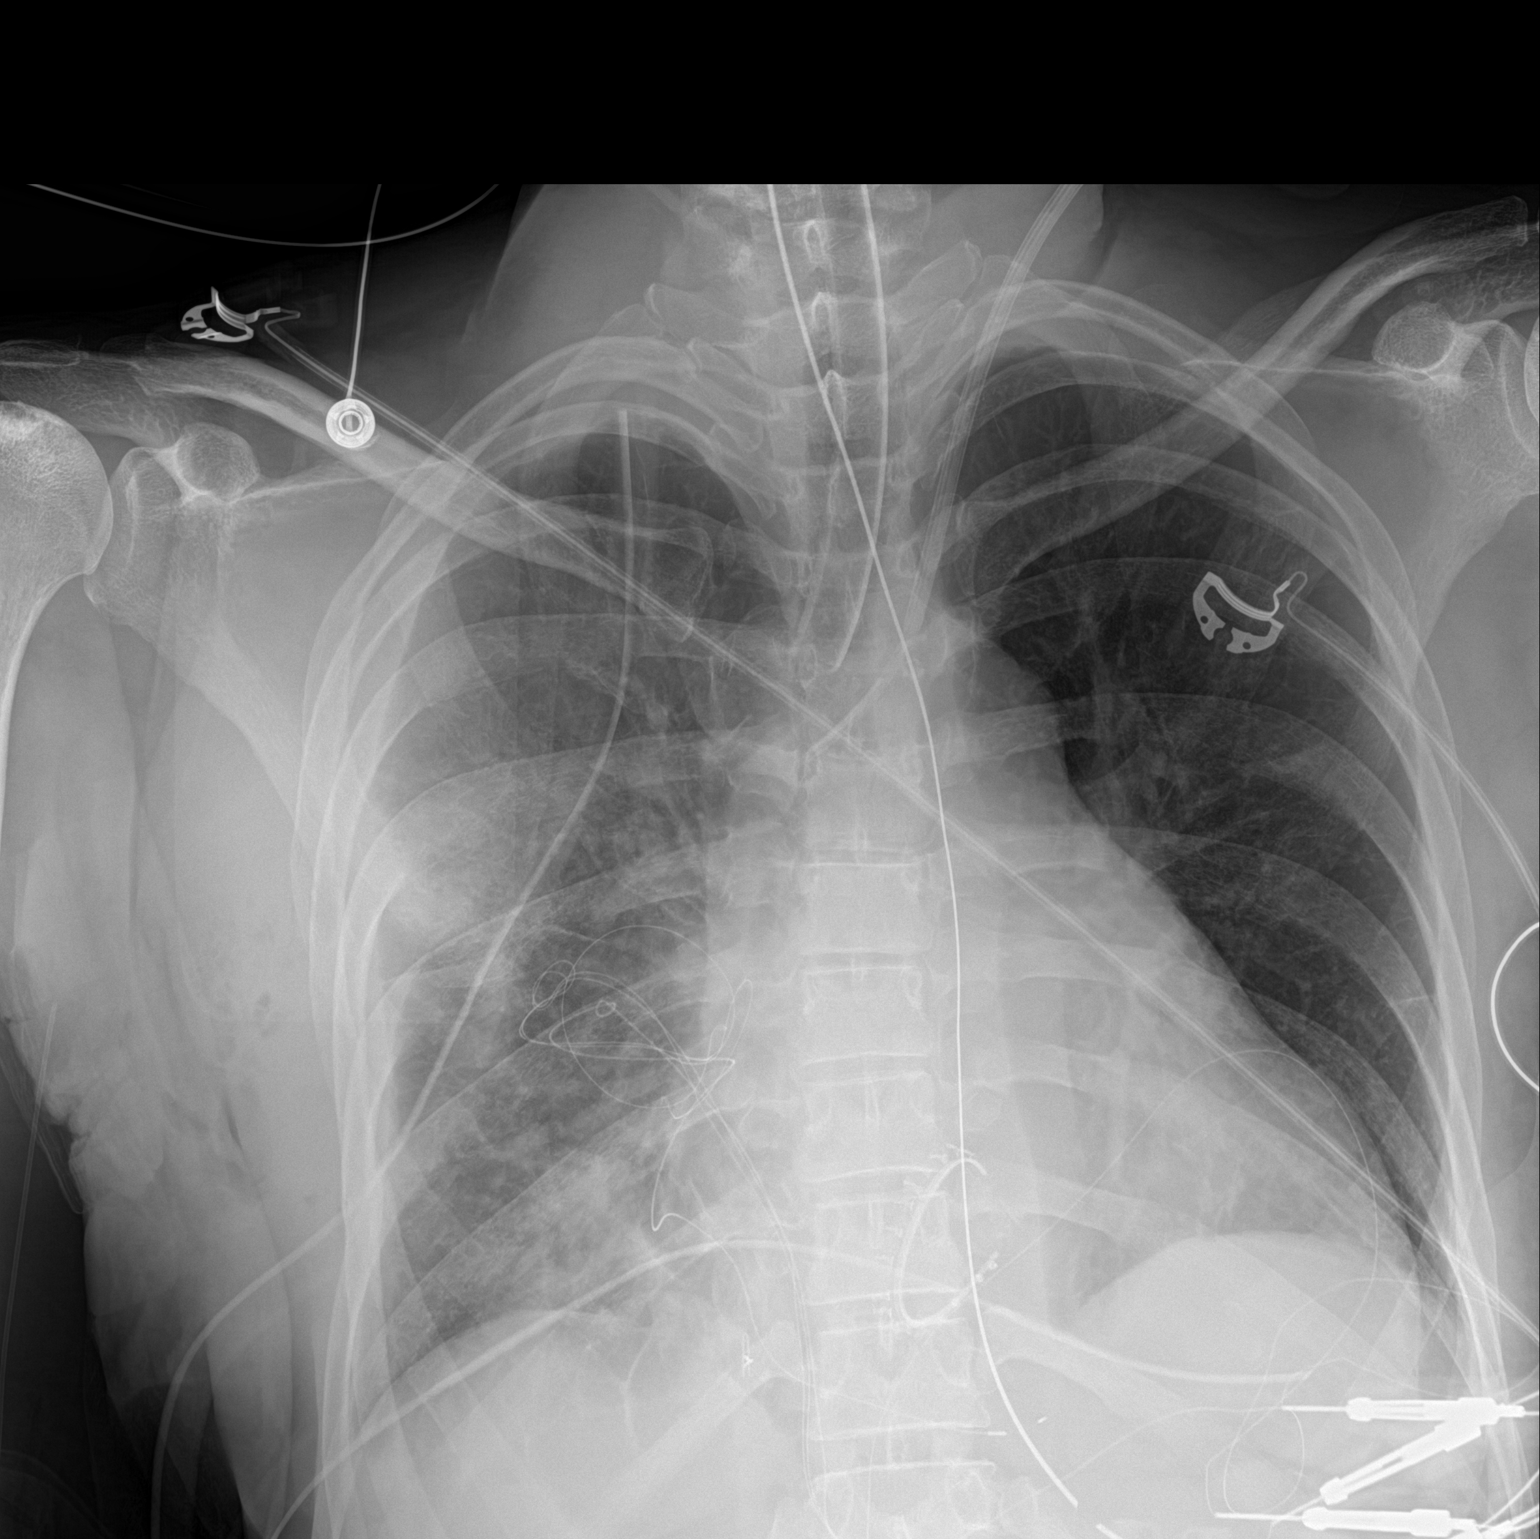

[1 of 1 positions shown; findings below may reference images not displayed]

FINDINGS: Endotracheal tube tip 3.6 cm from the carina. Tip and side port of
the enteric tube below the diaphragm in the stomach. Left internal
jugular central venous catheter tip projects over the upper SVC, the
sheath appears in place. Right chest tube with tip directed towards
the apex. Presumed mediastinal drain. Multiple epicardial wires in
place. Interval cardiac valve replacement. Upper normal heart size
with unchanged mediastinal contours. Right pleural effusion tracking
superolaterally. Patchy airspace opacities throughout the right
hemithorax. No visualized pneumothorax. Small left pleural effusion
tracking medially. No acute osseous abnormalities are seen.
IMPRESSION: 1. Endotracheal tube 3.6 cm from the carina. Enteric tube tip and
side-port below the diaphragm. Left internal jugular central venous
catheter tip projects over the upper SVC, sheath remains in place.
2. Cardiac valve replacement, tricuspid per history. Right chest
tube and mediastinal drain in place. Right pleural effusion tracking
superolaterally. Patchy airspace opacities throughout the right
hemithorax likely atelectasis.
3. Small left pleural effusion tracking medially.

## 2021-05-02 IMAGING — DX DG CHEST 1V PORT
1 series · 1 of 1 positions shown · non-contrast
Comparison: Yesterday

CLINICAL DATA: Atelectasis.  ECMO.

EXAM:
PORTABLE CHEST 1 VIEW

[chest]
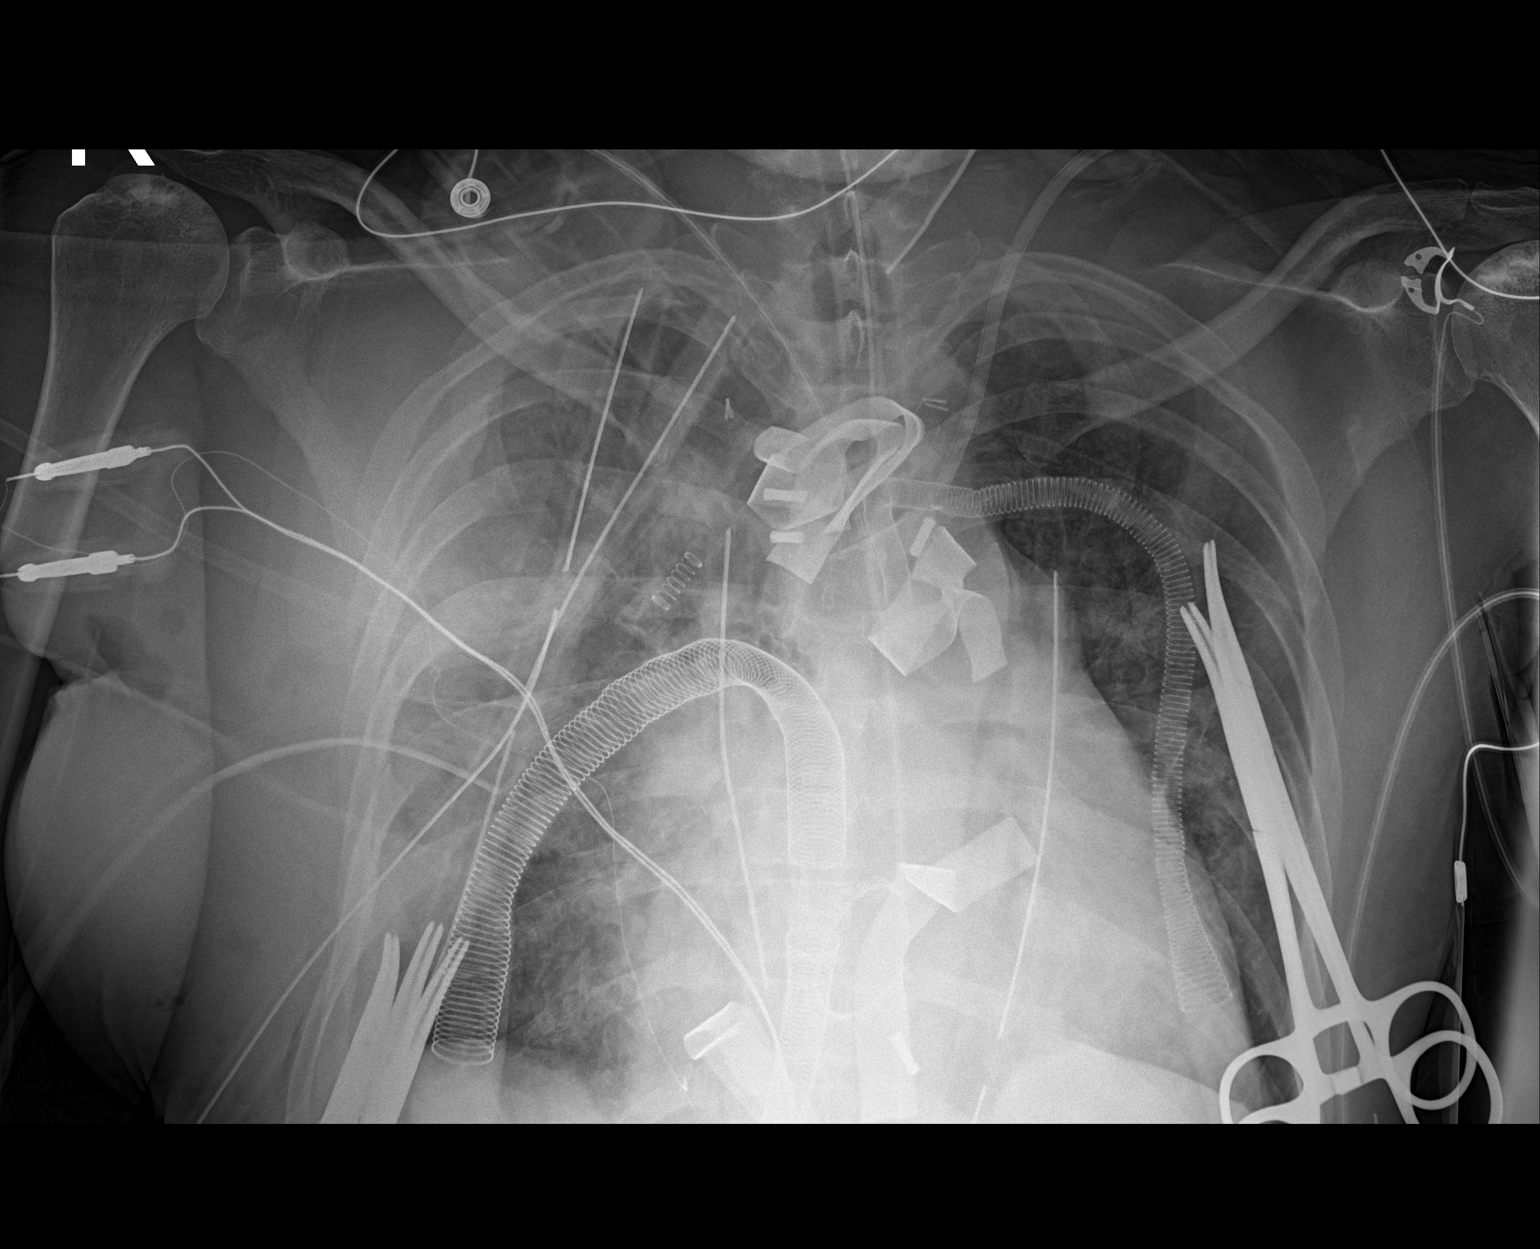

[1 of 1 positions shown; findings below may reference images not displayed]

FINDINGS: The endotracheal tube tip is obscured but terminates near the
clavicular heads. Likely open chest with ECMO catheters and packing
in place. Chest tubes in place. Left IJ line with tip near the right
brachiocephalic vein. No visible pneumothorax. Hazy bilateral
pulmonary opacity.
IMPRESSION: 1. Presumably open chest with new ECMO catheters.
2. Airspace opacity favoring edema. No visible pneumothorax or
pleural fluid.

## 2021-05-03 IMAGING — DX DG CHEST 1V PORT
1 series · 1 of 1 positions shown · non-contrast
Comparison: 10/22/2019

CLINICAL DATA: Respiratory failure. ECMO

EXAM:
PORTABLE CHEST 1 VIEW

[chest]
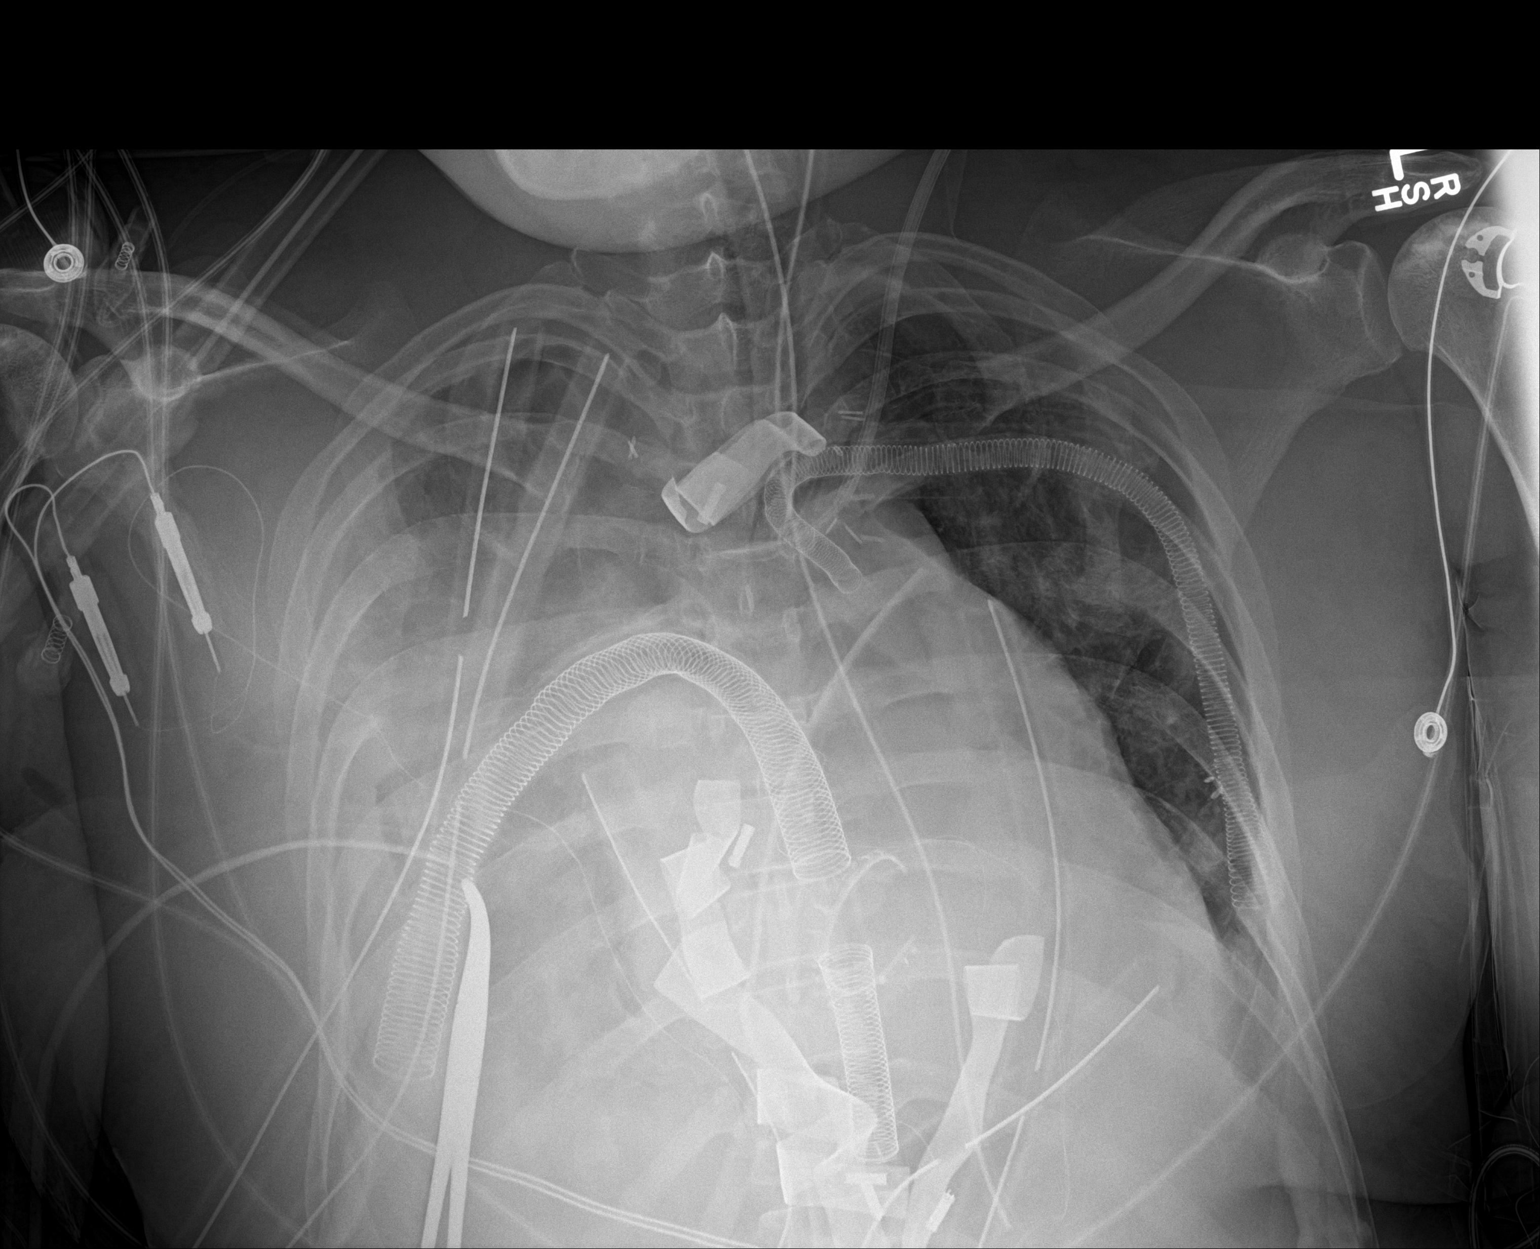

[1 of 1 positions shown; findings below may reference images not displayed]

FINDINGS: The endotracheal tube, NG tube, mediastinal drain tubes and chest
tubes appears stable. The ECMO catheters are stable.

No pneumothorax is identified.

Slight worsening lung aeration with progressive airspace
consolidation most notably in the right lung.
IMPRESSION: 1. Stable support apparatus.
2. Worsening lung aeration with progressive airspace consolidation.
